# Patient Record
Sex: Male | Born: 1935 | Race: White | Hispanic: No | Smoking: Former smoker
Health system: Southern US, Community
[De-identification: ages and names within clinical notes are randomized; demographics above are authoritative.]

## PROBLEM LIST (undated history)

## (undated) DIAGNOSIS — E785 Hyperlipidemia, unspecified: Secondary | ICD-10-CM

## (undated) DIAGNOSIS — M1990 Unspecified osteoarthritis, unspecified site: Secondary | ICD-10-CM

## (undated) DIAGNOSIS — F329 Major depressive disorder, single episode, unspecified: Secondary | ICD-10-CM

## (undated) DIAGNOSIS — N390 Urinary tract infection, site not specified: Secondary | ICD-10-CM

## (undated) DIAGNOSIS — N183 Chronic kidney disease, stage 3 unspecified: Secondary | ICD-10-CM

## (undated) DIAGNOSIS — M545 Low back pain, unspecified: Secondary | ICD-10-CM

## (undated) DIAGNOSIS — F32A Depression, unspecified: Secondary | ICD-10-CM

## (undated) DIAGNOSIS — G8250 Quadriplegia, unspecified: Secondary | ICD-10-CM

## (undated) DIAGNOSIS — I739 Peripheral vascular disease, unspecified: Secondary | ICD-10-CM

## (undated) DIAGNOSIS — G8929 Other chronic pain: Secondary | ICD-10-CM

## (undated) DIAGNOSIS — N200 Calculus of kidney: Secondary | ICD-10-CM

## (undated) DIAGNOSIS — I10 Essential (primary) hypertension: Secondary | ICD-10-CM

## (undated) DIAGNOSIS — Z87442 Personal history of urinary calculi: Secondary | ICD-10-CM

## (undated) DIAGNOSIS — E1142 Type 2 diabetes mellitus with diabetic polyneuropathy: Secondary | ICD-10-CM

## (undated) DIAGNOSIS — R269 Unspecified abnormalities of gait and mobility: Secondary | ICD-10-CM

## (undated) DIAGNOSIS — M47817 Spondylosis without myelopathy or radiculopathy, lumbosacral region: Secondary | ICD-10-CM

## (undated) DIAGNOSIS — E119 Type 2 diabetes mellitus without complications: Secondary | ICD-10-CM

## (undated) HISTORY — PX: SHOULDER SURGERY: SHX246

## (undated) HISTORY — DX: Peripheral vascular disease, unspecified: I73.9

## (undated) HISTORY — DX: Low back pain, unspecified: M54.50

## (undated) HISTORY — DX: Depression, unspecified: F32.A

## (undated) HISTORY — DX: Major depressive disorder, single episode, unspecified: F32.9

## (undated) HISTORY — PX: EYE SURGERY: SHX253

## (undated) HISTORY — DX: Other chronic pain: G89.29

## (undated) HISTORY — DX: Quadriplegia, unspecified: G82.50

## (undated) HISTORY — DX: Low back pain: M54.5

## (undated) HISTORY — PX: CATARACT EXTRACTION W/ INTRAOCULAR LENS  IMPLANT, BILATERAL: SHX1307

## (undated) HISTORY — DX: Type 2 diabetes mellitus with diabetic polyneuropathy: E11.42

## (undated) HISTORY — DX: Spondylosis without myelopathy or radiculopathy, lumbosacral region: M47.817

## (undated) HISTORY — DX: Unspecified osteoarthritis, unspecified site: M19.90

## (undated) HISTORY — PX: APPENDECTOMY: SHX54

## (undated) HISTORY — PX: BACK SURGERY: SHX140

## (undated) HISTORY — DX: Unspecified abnormalities of gait and mobility: R26.9

## (undated) HISTORY — PX: OTHER SURGICAL HISTORY: SHX169

## (undated) HISTORY — PX: LUMBAR SPINE SURGERY: SHX701

---

## 2000-02-06 ENCOUNTER — Other Ambulatory Visit: Admission: RE | Admit: 2000-02-06 | Discharge: 2000-02-06 | Payer: Self-pay | Admitting: Gastroenterology

## 2001-03-14 ENCOUNTER — Ambulatory Visit (HOSPITAL_COMMUNITY): Admission: RE | Admit: 2001-03-14 | Discharge: 2001-03-14 | Payer: Self-pay | Admitting: Interventional Cardiology

## 2001-05-31 ENCOUNTER — Inpatient Hospital Stay (HOSPITAL_COMMUNITY): Admission: EM | Admit: 2001-05-31 | Discharge: 2001-06-02 | Payer: Self-pay | Admitting: Emergency Medicine

## 2001-05-31 ENCOUNTER — Encounter: Payer: Self-pay | Admitting: Neurosurgery

## 2001-06-01 ENCOUNTER — Encounter: Payer: Self-pay | Admitting: Neurosurgery

## 2001-06-07 ENCOUNTER — Inpatient Hospital Stay (HOSPITAL_COMMUNITY): Admission: EM | Admit: 2001-06-07 | Discharge: 2001-06-13 | Payer: Self-pay | Admitting: Emergency Medicine

## 2003-11-15 ENCOUNTER — Encounter: Admission: RE | Admit: 2003-11-15 | Discharge: 2003-11-15 | Payer: Self-pay | Admitting: Neurosurgery

## 2003-11-28 ENCOUNTER — Encounter: Admission: RE | Admit: 2003-11-28 | Discharge: 2003-11-28 | Payer: Self-pay | Admitting: Neurosurgery

## 2004-01-22 ENCOUNTER — Encounter (INDEPENDENT_AMBULATORY_CARE_PROVIDER_SITE_OTHER): Payer: Self-pay | Admitting: *Deleted

## 2004-01-22 ENCOUNTER — Inpatient Hospital Stay (HOSPITAL_COMMUNITY): Admission: RE | Admit: 2004-01-22 | Discharge: 2004-01-23 | Payer: Self-pay | Admitting: Neurosurgery

## 2004-02-04 ENCOUNTER — Emergency Department (HOSPITAL_COMMUNITY): Admission: EM | Admit: 2004-02-04 | Discharge: 2004-02-04 | Payer: Self-pay | Admitting: Emergency Medicine

## 2004-03-22 ENCOUNTER — Ambulatory Visit (HOSPITAL_COMMUNITY): Admission: RE | Admit: 2004-03-22 | Discharge: 2004-03-22 | Payer: Self-pay | Admitting: Neurosurgery

## 2004-04-08 ENCOUNTER — Inpatient Hospital Stay (HOSPITAL_COMMUNITY): Admission: RE | Admit: 2004-04-08 | Discharge: 2004-04-09 | Payer: Self-pay | Admitting: Neurosurgery

## 2004-04-21 ENCOUNTER — Ambulatory Visit (HOSPITAL_COMMUNITY): Admission: RE | Admit: 2004-04-21 | Discharge: 2004-04-21 | Payer: Self-pay | Admitting: Neurosurgery

## 2004-09-29 ENCOUNTER — Ambulatory Visit: Payer: Self-pay | Admitting: Infectious Diseases

## 2004-10-02 ENCOUNTER — Ambulatory Visit (HOSPITAL_COMMUNITY): Admission: RE | Admit: 2004-10-02 | Discharge: 2004-10-02 | Payer: Self-pay | Admitting: Infectious Diseases

## 2004-10-06 ENCOUNTER — Ambulatory Visit: Payer: Self-pay | Admitting: Infectious Diseases

## 2004-10-13 ENCOUNTER — Ambulatory Visit: Payer: Self-pay | Admitting: Infectious Diseases

## 2004-10-20 ENCOUNTER — Ambulatory Visit: Payer: Self-pay | Admitting: Infectious Diseases

## 2004-10-20 ENCOUNTER — Ambulatory Visit (HOSPITAL_COMMUNITY): Admission: RE | Admit: 2004-10-20 | Discharge: 2004-10-21 | Payer: Self-pay | Admitting: Neurosurgery

## 2004-10-21 ENCOUNTER — Encounter (INDEPENDENT_AMBULATORY_CARE_PROVIDER_SITE_OTHER): Payer: Self-pay | Admitting: *Deleted

## 2004-11-17 ENCOUNTER — Ambulatory Visit: Payer: Self-pay | Admitting: Infectious Diseases

## 2005-01-12 ENCOUNTER — Ambulatory Visit: Payer: Self-pay | Admitting: Infectious Diseases

## 2005-02-03 ENCOUNTER — Ambulatory Visit (HOSPITAL_COMMUNITY): Admission: RE | Admit: 2005-02-03 | Discharge: 2005-02-03 | Payer: Self-pay | Admitting: Gastroenterology

## 2005-04-20 ENCOUNTER — Ambulatory Visit: Payer: Self-pay | Admitting: Infectious Diseases

## 2008-10-31 ENCOUNTER — Encounter: Admission: RE | Admit: 2008-10-31 | Discharge: 2008-10-31 | Payer: Self-pay | Admitting: Neurosurgery

## 2010-11-10 ENCOUNTER — Observation Stay (HOSPITAL_COMMUNITY)
Admission: EM | Admit: 2010-11-10 | Discharge: 2010-11-10 | Payer: Self-pay | Source: Home / Self Care | Admitting: Emergency Medicine

## 2010-11-12 ENCOUNTER — Emergency Department (HOSPITAL_COMMUNITY)
Admission: EM | Admit: 2010-11-12 | Discharge: 2010-11-12 | Payer: Self-pay | Source: Home / Self Care | Admitting: Emergency Medicine

## 2011-02-17 LAB — URINALYSIS, ROUTINE W REFLEX MICROSCOPIC
Bilirubin Urine: NEGATIVE
Bilirubin Urine: NEGATIVE
Glucose, UA: NEGATIVE mg/dL
Glucose, UA: NEGATIVE mg/dL
Ketones, ur: 15 mg/dL — AB
Ketones, ur: 15 mg/dL — AB
Nitrite: NEGATIVE
Nitrite: NEGATIVE
Protein, ur: NEGATIVE mg/dL
Protein, ur: NEGATIVE mg/dL
Specific Gravity, Urine: 1.02 (ref 1.005–1.030)
Specific Gravity, Urine: 1.025 (ref 1.005–1.030)
Urobilinogen, UA: 0.2 mg/dL (ref 0.0–1.0)
Urobilinogen, UA: 0.2 mg/dL (ref 0.0–1.0)
pH: 5 (ref 5.0–8.0)
pH: 5 (ref 5.0–8.0)

## 2011-02-17 LAB — POCT I-STAT, CHEM 8
BUN: 26 mg/dL — ABNORMAL HIGH (ref 6–23)
BUN: 30 mg/dL — ABNORMAL HIGH (ref 6–23)
Calcium, Ion: 1.08 mmol/L — ABNORMAL LOW (ref 1.12–1.32)
Calcium, Ion: 1.13 mmol/L (ref 1.12–1.32)
Chloride: 107 mEq/L (ref 96–112)
Creatinine, Ser: 1.2 mg/dL (ref 0.4–1.5)
Creatinine, Ser: 1.3 mg/dL (ref 0.4–1.5)
Glucose, Bld: 145 mg/dL — ABNORMAL HIGH (ref 70–99)
Glucose, Bld: 157 mg/dL — ABNORMAL HIGH (ref 70–99)
HCT: 39 % (ref 39.0–52.0)
Hemoglobin: 13.3 g/dL (ref 13.0–17.0)
Potassium: 4.2 mEq/L (ref 3.5–5.1)
Sodium: 138 mEq/L (ref 135–145)
Sodium: 138 mEq/L (ref 135–145)
TCO2: 24 mmol/L (ref 0–100)
TCO2: 25 mmol/L (ref 0–100)

## 2011-02-17 LAB — CBC
HCT: 37.7 % — ABNORMAL LOW (ref 39.0–52.0)
HCT: 37.9 % — ABNORMAL LOW (ref 39.0–52.0)
Hemoglobin: 12.4 g/dL — ABNORMAL LOW (ref 13.0–17.0)
Hemoglobin: 12.6 g/dL — ABNORMAL LOW (ref 13.0–17.0)
MCH: 32.2 pg (ref 26.0–34.0)
MCH: 32.6 pg (ref 26.0–34.0)
MCHC: 32.9 g/dL (ref 30.0–36.0)
MCHC: 33.2 g/dL (ref 30.0–36.0)
MCV: 97.9 fL (ref 78.0–100.0)
MCV: 98.2 fL (ref 78.0–100.0)
Platelets: 246 10*3/uL (ref 150–400)
Platelets: 259 10*3/uL (ref 150–400)
RBC: 3.85 MIL/uL — ABNORMAL LOW (ref 4.22–5.81)
RBC: 3.86 MIL/uL — ABNORMAL LOW (ref 4.22–5.81)
RDW: 12.5 % (ref 11.5–15.5)
RDW: 12.5 % (ref 11.5–15.5)
WBC: 11.1 10*3/uL — ABNORMAL HIGH (ref 4.0–10.5)
WBC: 12.8 10*3/uL — ABNORMAL HIGH (ref 4.0–10.5)

## 2011-02-17 LAB — COMPREHENSIVE METABOLIC PANEL
ALT: 18 U/L (ref 0–53)
AST: 22 U/L (ref 0–37)
Albumin: 4 g/dL (ref 3.5–5.2)
Alkaline Phosphatase: 51 U/L (ref 39–117)
BUN: 30 mg/dL — ABNORMAL HIGH (ref 6–23)
CO2: 24 mEq/L (ref 19–32)
Calcium: 9.5 mg/dL (ref 8.4–10.5)
Chloride: 104 mEq/L (ref 96–112)
Creatinine, Ser: 1.26 mg/dL (ref 0.4–1.5)
GFR calc Af Amer: >60 mL/min (ref >60)
GFR calc non Af Amer: 55 mL/min — ABNORMAL LOW (ref >60)
Glucose, Bld: 146 mg/dL — ABNORMAL HIGH (ref 70–99)
Potassium: 4.1 mEq/L (ref 3.5–5.1)
Sodium: 138 mEq/L (ref 135–145)
Total Bilirubin: 0.6 mg/dL (ref 0.3–1.2)
Total Protein: 6.1 g/dL (ref 6.0–8.3)

## 2011-02-17 LAB — HEMOCCULT GUIAC POC 1CARD (OFFICE): Fecal Occult Bld: NEGATIVE

## 2011-02-17 LAB — URINE MICROSCOPIC-ADD ON

## 2011-02-17 LAB — DIFFERENTIAL
Basophils Absolute: 0 10*3/uL (ref 0.0–0.1)
Basophils Absolute: 0 10*3/uL (ref 0.0–0.1)
Basophils Relative: 0 % (ref 0–1)
Basophils Relative: 0 % (ref 0–1)
Eosinophils Absolute: 0 10*3/uL (ref 0.0–0.7)
Eosinophils Absolute: 0.1 10*3/uL (ref 0.0–0.7)
Eosinophils Relative: 0 % (ref 0–5)
Eosinophils Relative: 1 % (ref 0–5)
Lymphocytes Relative: 8 % — ABNORMAL LOW (ref 12–46)
Lymphocytes Relative: 9 % — ABNORMAL LOW (ref 12–46)
Lymphs Abs: 1 10*3/uL (ref 0.7–4.0)
Lymphs Abs: 1.1 10*3/uL (ref 0.7–4.0)
Monocytes Absolute: 0.5 10*3/uL (ref 0.1–1.0)
Monocytes Absolute: 0.9 10*3/uL (ref 0.1–1.0)
Monocytes Relative: 4 % (ref 3–12)
Monocytes Relative: 8 % (ref 3–12)
Neutro Abs: 11.2 10*3/uL — ABNORMAL HIGH (ref 1.7–7.7)
Neutro Abs: 9 10*3/uL — ABNORMAL HIGH (ref 1.7–7.7)
Neutrophils Relative %: 81 % — ABNORMAL HIGH (ref 43–77)
Neutrophils Relative %: 87 % — ABNORMAL HIGH (ref 43–77)

## 2011-02-17 LAB — GLUCOSE, CAPILLARY: Glucose-Capillary: 122 mg/dL — ABNORMAL HIGH (ref 70–99)

## 2011-02-17 LAB — LIPASE, BLOOD: Lipase: 49 U/L (ref 11–59)

## 2011-02-17 LAB — LACTIC ACID, PLASMA
Lactic Acid, Venous: 1.1 mmol/L (ref 0.5–2.2)
Lactic Acid, Venous: 1.3 mmol/L (ref 0.5–2.2)

## 2011-02-17 LAB — URINE CULTURE: Culture  Setup Time: 201112071159

## 2011-04-24 NOTE — Cardiovascular Report (Signed)
[place]. [facility]  Patient:    Javier Murillo, Javier Murillo                     MRN: [id number] Proc. Date: 03/14/01 Adm. Date:  [id number] Disc. Date: [id number] Attending:  Lyn Records. Iii CC:         Cardiac Catheterization Laboratory  Modesta Messing, M.D.   Cardiac Catheterization  PROCEDURE PERFORMED: 1. Left heart catheterization. 2. Selective coronary angiography. 3. Left ventriculography. 4. Perclose arteriotomy closure.  CARDIOLOGIST:  Celso Sickle, M.D.  INDICATIONS:  Abnormal Cardiolyte study in the past, and recent chest discomfort consistent with angina in this patient with risk factors for coronary artery disease, including diabetes.  DESCRIPTION OF PROCEDURE:  After an informed consent, a 6-French sheath was inserted into the right femoral artery using the modified Seldinger technique. A 6-French A2 multipurpose catheter was used for hemodynamic recordings, left ventriculography by power injection and selective left and right coronary angiography.  The patient tolerated the procedure without complications.  A sheathogram was performed on the right iliac, and Perclose was used for arteriotomy closure on the right femoral, without complications.  RESULTS: HEMODYNAMIC DATA: Aortic pressure:  132/64 mmHg. Left ventricular pressure:  133/13 mmHg.  LEFT VENTRICULOGRAPHY:  The left ventricle is normal in size and demonstrates normal overall DD:  03/14/01 TD:  03/14/01 Job: [id number]

## 2011-04-24 NOTE — Op Note (Signed)
NAME:  Javier Murillo, Javier Murillo NO.:  [id number]   MEDICAL RECORD NO.:  [id number]          PATIENT TYPE:  AMB   LOCATION:  ENDO                         FACILITY:  [facility]   PHYSICIAN:  Danise Edge, M.D.   DATE OF BIRTH:  08-Sep-1930   DATE OF PROCEDURE:  02/03/2005  DATE OF DISCHARGE:                                 OPERATIVE REPORT   PROCEDURE:  Screening colonoscopy.   INDICATIONS FOR PROCEDURE:  Mr. [name] is a 77 year old male born  Jul 18, 1930. In 1980, his barium enema showed colonic diverticulosis. Ten  years ago, he underwent a colonoscopy and a hyperplastic polyp was removed.  Mr. [name] is scheduled to undergo a screening colonoscopy with polypectomy  to prevent colon cancer.   ENDOSCOPIST:  Danise Edge, M.D.   PREMEDICATION:  Versed 3 mg, Demerol 30 mg.   DESCRIPTION OF PROCEDURE:  After obtaining informed consent, Mr. [name] was  placed in the left lateral decubitus position. I administered intravenous  Demerol and intravenous Versed to achieve conscious sedation for the  procedure. The patient's blood pressure, oxygen saturation and cardiac  rhythm were monitored throughout the procedure and documented in the medical  record.   Anal inspection and digital rectal exam were normal. The Olympus adjustable  pediatric colonoscope was introduced into the rectum and advanced to the  cecum. Colonic preparation for the exam today was excellent.   RECTUM:  Normal.   SIGMOID COLON AND DESCENDING COLON:  Normal.   SPLENIC FLEXURE:  Normal.   TRANSVERSE COLON:  Normal.   HEPATIC FLEXURE:  Normal.   ASCENDING COLON:  Normal.   CECUM AND ILEOCECAL VALVE:  Normal.   ASSESSMENT:  Normal screening proctocolonoscopy to the cecum.      MJ/MEDQ  D:  02/03/2005  T:  02/03/2005  Job:  [id number]   cc:   Georgann Housekeeper, MD  [address]  [place]  [place] [address]  Fax: [phone]

## 2011-04-24 NOTE — Op Note (Signed)
NAME:  Javier Murillo, Javier Murillo NO.:  [id number]   MEDICAL RECORD NO.:  [id number]          PATIENT TYPE:  OIB   LOCATION:  3030                         FACILITY:  [facility]   PHYSICIAN:  Hewitt Shorts, M.D.DATE OF BIRTH:  [birth year]   DATE OF PROCEDURE:  10/20/2004  DATE OF DISCHARGE:                                 OPERATIVE REPORT   PREOPERATIVE DIAGNOSIS:  Postoperative lumbar wound abscess and sinus tract.   POSTOPERATIVE DIAGNOSIS:  Postoperative lumbar wound abscess and sinus  tract.   PROCEDURES:  Excision and debridement of lumbar wound abscess and sinus  tract with full-thickness epidermis, dermis and subcutaneous tissue  resection with primary closure.   SURGEON:  Hewitt Shorts, M.D.   ANESTHESIA:  General endotracheal.   INDICATIONS:  The patient is a 77 year old man who had undergone lumbar  surgery in the spring of this year.  Postoperatively his course was  complicated by a wound infection treated with several debridements.  The  patient developed a persistent subcutaneous wound abscess with a  periodically draining sinus tract which grew out methicillin-resistant  Staphylococcus aureus.  The decision was made to proceed with excision and  debridement of abscess and sinus tract.   DESCRIPTION OF PROCEDURE:  The patient was brought to the operating room and  placed under general endotracheal anesthesia.  The patient was turned to a  prone position.  The lumbar region was prepped with Betadine soap and  solution and draped in a sterile fashion.  The skin and subcutaneous tissues  were infiltrated lateral to the midline on each side with local anesthetic  with epinephrine.  An elliptical skin incision was made and dissection was  carried down through the subcutaneous tissues to the lumbar fascia.  We then  dissected around the base of the elliptical tissue just superficial to the  lumbar fascia and excised the abscess and sinus tract in total.   There was a  small amount of residual granulation tissue which was further debrided with  sharp dissection.  Then hemostasis was established with use of bipolar  cautery and electrocautery.  Once the excision and debridement was completed  and hemostasis was established, the wound was irrigated with 500 mL of  bacitracin solution and then closed in multiple layers.  The deep  subcutaneous tissues were approximated with interrupted inverted #1 PDS 2  suture.  The subcutaneous and subcuticular were closed with interrupted  inverted 2-0 undyed sutures, as well as 5-0 PDS 2 suture.  The skin edges  were approximated with Dermabond.  The procedure was tolerated well.  The  estimated blood loss was less than 25 mL.  Sponge count correct.  Following  surgery, the patient was to be turned back to the supine position, reversed  from anesthetic, extubated and transferred to the recovery room for further  care.      RWN/MEDQ  D:  10/20/2004  T:  10/20/2004  Job:  [id number]

## 2011-04-24 NOTE — H&P (Signed)
[place]. [facility]  Patient:    Javier Murillo, Javier Murillo                     MRN: [id number] Adm. Date:  [id number] Attending:  Barton Fanny                         History and Physical  HISTORY OF PRESENT ILLNESS:  The patient is a 77 year old right-handed white male who was evaluated for an acute right lumbar radiculopathy.  He is a former patient of mine and is in fact status post three previous lumbar surgeries on the right side by Dr. Hope Pigeon in 1981, on the left side by Dr. Roxan Hockey in 1991, and on the left side by myself in 1997.  Patient explains that his difficulties began a week or so ago with some intermittent pain.  It worsened about two and a half days ago and became severe over the past 24 hours.  He complains of pain in the right side of his low back into his right buttock, hip, and anterolateral right thigh.  He denies any numbness, tingling, or weakness.  He finds that the pain is worse with sitting or laying.  Patient presented to the emergency room after having spoken to me by phone from his home.  We recommended he come in for evaluation.  PAST MEDICAL HISTORY:  Notable for hypertension treated for the past five years or so, history of diabetes treated for the past 18-20 years.  He apparently has had some cardiac arrhythmia in the past which has been controlled.  He did not describe any history of myocardial infarction, cancer, stroke, peptic ulcer disease, or lung disease.  PAST SURGICAL HISTORY:  In addition to his three lumbar surgeries as described above, right inguinal herniorrhaphy in 1992 and right shoulder surgery in 1995.  ALLERGIES:  TETANUS HORSE SERUM.  CURRENT MEDICATIONS: 1. Cozaar 50 mg q.a.m. 2. Atenolol 12.5 mg b.i.d. 3. Glucovance 5/500 two tablets p.o. b.i.d. 4. Humulin R 7-8 units b.i.d. 5. Humulin N 14 units b.i.d.  FAMILY HISTORY:  Father died at age 35 of cardiac disease.  Mother died at age 58 of  liver cancer.  SOCIAL HISTORY:  Patient is married.  His wife, Valentina Gu, is also a patient of mine.  They are retired.  He does not smoke.  REVIEW OF SYSTEMS:  Notable for some shortness of breath associated with exertion.  It has been somewhat of a problem recently.  He did undergo evaluation with Dr. Garnette Scheuermann three months ago, specifically undergoing a cardiac catheterization that was unremarkable and showed little in the way of heart disease.  Review of systems otherwise unremarkable except as noted in history of present illness and past medical history.  PHYSICAL EXAMINATION  GENERAL:  Patient is a well-developed, well-nourished white male in obvious discomfort.  VITAL SIGNS:  Temperature 98.0, pulse 65, blood pressure 174/73, respiratory rate 20.  LUNGS:  Clear to auscultation.  He has symmetrical respiratory excursion.  HEART:  Regular rate and rhythm.  Normal S1, S2.  No murmur.  ABDOMEN:  Soft, nondistended.  Bowel sounds are present.  EXTREMITIES:  No cyanosis, clubbing, or edema.  MUSCULOSKELETAL:  No tenderness to palpation over the lumbosacral spine.  NEUROLOGIC:  Motor examination shows 5/5 strength to the lower extremities including the ileus psoas, quadriceps, dorsiflexors, plantar flexor, extensor hallux longus.  Sensation is intact to pin prick in lower extremities. Reflexes are  absent at the quadriceps and gastrocnemii, though symmetrical bilaterally.  Toes are downgoing bilaterally.  IMPRESSION:  Acute right lumbar radiculopathy, etiology uncertain.  The patient is status post three previous lumbar surgeries.  PLAN:  The patient will be admitted to the 3000 neurosurgical unit.  Will obtain MRI scan of the lumbar scan without and with gallium as well as a lumbosacral spine x-ray ______ flexion/extension views and be able to give further recommendations after these studies are completed. DD:  05/31/01 TD:  05/31/01 Job: [id number]

## 2011-04-24 NOTE — Cardiovascular Report (Signed)
[place]. [facility]  Patient:    Javier Murillo, Javier Murillo                     MRN: [id number] Proc. Date: 03/14/01 Adm. Date:  [id number] Disc. Date: [id number] Attending:  Lyn Records. Iii CC:         Cardiac Catheterization Laboratory  Modesta Messing, M.D.   Cardiac Catheterization  PROCEDURE: 1. Left heart catheterization. 2. Selective coronary angiogram. 3. Left ventriculography. 4. Perclose arteriotomy closure.  CARDIOLOGIST:  Darci Needle, M.D.  INDICATIONS:  The patient is diabetic and has had recent chest discomfort compatible with angina.  There is a history of a prior mildly abnormal Cardiolyte study slightly over one year ago.  This study is being done to document coronary anatomy, and to help guide therapy.  DESCRIPTION OF PROCEDURE:  After informed consent, a 6-French was inserted into the right femoral artery using the modified Seldinger technique.  A 6-French A2 multipurpose catheter was then used for hemodynamic recordings, left ventriculography by power injection, and selective left and right coronary angiography.  A sheathogram was performed in the right femoral arterial sheath, documenting proper position for Perclose arteriotomy closure.  This was performed without difficulty.  No complications occurred.  RESULTS: HEMODYNAMICS: Aortic pressure:  132/64 mmHg. Left ventricular pressure:  133/13 mmHg.  LEFT VENTRICULOGRAPHY:  The left ventricle is normal in size and demonstrates overall normal contractility.  The ejection fraction is in excess of 65%.  No mitral regurgitation is noted.  SELECTIVE CORONARY ANGIOGRAPHY: 1. Left main coronary artery:  The left main coronary artery is normal. 2. Left anterior descending coronary artery:  The left anterior descending    coronary artery is a large vessel that wraps around the left ventricular    apex.  Luminal irregularities are noted in the midvessel after the first    septal  perforator.  In the apical portion of the LAD around the left    ventricular apex there is a 60%-70% stenosis.  There is very small    vessel distribution beyond this stenosis.  The first and second diagonal    are large and free of significant obstruction. 3. Circumflex coronary artery:  The circumflex coronary artery is large    and tortuous.  It gives origin to one dominant branching second obtuse    marginal.  The first obtuse marginal contains a 30% ostial narrowing.    No significant obstruction is noted in the circumflex system. 4. Right coronary artery:  The right coronary artery is a very tortuous    vessel and contains 20% mid-vessel narrowing, but no significant    obstruction is noted.  CONCLUSIONS: 1. Moderate distal left anterior descending coronary artery.  The stenosis    in the left anterior descending coronary artery is around the left    ventricular apex, with only minimal left anterior descending coronary    artery beyond the lesion.  There is also mild right coronary disease. 2. Normal left ventricular function.  RECOMMENDATIONS:  Medical therapy.  Consider other etiologies of chest discomfort, including gastroesophageal reflux, chest wall pain, etc.DD: 03/14/01 TD:  03/14/01 Job: [id number]

## 2011-04-24 NOTE — Op Note (Signed)
NAME:  Javier Murillo, Javier Murillo                        ACCOUNT NO.:  [id number]   MEDICAL RECORD NO.:  [id number]                   PATIENT TYPE:  INP   LOCATION:  3007                                 FACILITY:  [facility]   PHYSICIAN:  Reinaldo Meeker, M.D.              DATE OF BIRTH:  07-18-1930   DATE OF PROCEDURE:  04/08/2004  DATE OF DISCHARGE:                                 OPERATIVE REPORT   PREOPERATIVE DIAGNOSIS:  Nonhealing lumbar wound.   POSTOPERATIVE DIAGNOSIS:  Nonhealing lumbar wound.   OPERATION PERFORMED:  Incision and drainage and revision of lumbar wound.   SURGEON:  Reinaldo Meeker, M.D.   DESCRIPTION OF PROCEDURE:  After being placed in prone position, the  patient's back was prepped and draped in the usual sterile fashion.  Previous lumbar incision was opened and some granulation and mildly purulent  material was encountered superficially but not deep.  The incision was  carried down to the spinous processes.  Subperiosteal dissection was then  carried out on the \  spinous process and lamina to see if there was any tracking of the fluid  down deep but there was not.  At this time, self-retaining retractor was  placed for exposure.  Power irrigator was then used with 3 L of saline and  500 mL of antibiotic irrigation. Any abnormal tissue was excised at this  time.  Cultures were taken prior to irrigation.  A Hemovac drain was then  left in the wound and brought out through separate stab wound incision.  The  wound was then closed in multiple layers of Vicryl with running locking  nylon on the skin,.  A sterile dressing was then applied.  The patient was  extubated and taken to the recovery room in stable condition.                                               Reinaldo Meeker, M.D.    ROK/MEDQ  D:  04/08/2004  T:  04/08/2004  Job:  [id number]

## 2011-04-24 NOTE — Discharge Summary (Signed)
[place]. [facility]  Patient:    Javier Murillo, Javier Murillo                     MRN: [id number] Adm. Date:  [id number] Disc. Date: 06/13/01 Attending:  Tressie Stalker D                           Discharge Summary  HISTORY OF PRESENT ILLNESS:  The patient is a 77 year old man whom I operated on about two weeks ago and he was readmitted towards the end of his first postoperative week by my partner, Dr. Lovell Sheehan, because of recurrent severe radicular pain.  He had undergone a right L4-5 lumbar laminotomy and foraminotomy and had undergone three previous lumbar surgeries, as detailed in his admission notes.  Neurologic examination showed intact strength and sensation and his wound is healing well.  General examination was unremarkable.  HOSPITAL COURSE:  The patient was admitted by Dr. Lovell Sheehan and started on a PCA morphine pump.  Subsequently, he was started on Celebrex and Neurontin and he has made steady progress.  With the Neurontin and Celebrex, his pain has essentially resolved.  He is using infrequent amounts of narcotic analgesics. His PCA was discontinued.  He has been placed on Percocet.  His wound has healed well.  He is afebrile.  He is ambulating and overall he is feeling better.  DISPOSITION:  He is being discharged to home to continue on the Neurontin and Celebrex.  DISCHARGE MEDICATIONS:  Neurontin and Celebrex prescriptions were called into the [facility] pharmacy.  Specifically, Neurontin 30 mg capsules one t.i.d. 100 capsules with five refills, as well as Celebrex 200 mg b.i.d. 60 capsules and three refills.  He has also been given a prescription for Percocet one tablet p.o. 4-6 hours p.r.n. pain, 40 tablets and no refills.  DISCHARGE DIAGNOSIS:  Lumbar radiculopathy.  FOLLOW-UP:  The patient is already scheduled to see me next week for follow-up.  He is to follow up with all of his other treating physicians as previously scheduled. DD:   06/13/01 TD:  06/13/01 Job: [id number]

## 2011-04-24 NOTE — Consult Note (Signed)
NAME:  Javier Murillo, Javier Murillo                        ACCOUNT NO.:  [id number]   MEDICAL RECORD NO.:  [id number]                   PATIENT TYPE:  EMS   LOCATION:  MINO                                 FACILITY:  [facility]   PHYSICIAN:  Donalee Citrin, M.D.                     DATE OF BIRTH:  Apr 14, 1930   DATE OF CONSULTATION:  02/04/2004  DATE OF DISCHARGE:  02/04/2004                                   CONSULTATION   REFERRING PHYSICIAN:  Dr. Lorre Nick.   REASON FOR CONSULTATION:  Swelling of lumbar incision wound.   HISTORY OF PRESENT ILLNESS:  The patient is a very pleasant 77 year old  gentleman who is now 2 weeks out from a lumbar laminectomy and  microdiskectomy, who was last seen by Dr. Rolanda Lundborg Kritzer a few days ago  and noted to have some swelling around his incision and was told that he had  a hematoma and that eventually it would break down and possibly leak out.  Well, it did leak out yesterday, however, it appears the swelling came back  today and they were concerned because it apparently is getting progressively  worse.  The patient denies any new symptoms in his legs, denies any numbness  or tingling, has complete resolution in his preoperative leg pain and has no  difficulty with the bowel or bladder.  He also denies any form of headache,  postural headache or any headache of the like, has just soreness around the  incision.  He denies any fevers, chills, nausea or vomiting.  Review of the  operative procedure with Dr. Reinaldo Meeker showed no evidence of spinal  fluid leakage.   PHYSICAL EXAMINATION:  GENERAL:  On physical exam, the patient is awake and  alert, a 77 year old gentleman who is in no apparent distress.  HEENT:  Within normal limits.  NECK:  Neck supple.  LUNGS:  Lungs are clear to auscultation.  __________ .  NEUROLOGIC:  Neurologically, he has got 5/5 strength in his lower  extremities with the iliopsoas, quads, hamstrings, gastrocs and EHLs.  BACK:  His  incision does have an area of swelling around the __________  of  the incision that is firm.  There is small amount of separation of the  inferior aspect of his incision.   IMPRESSION AND PLAN:  Whether it is some very minor amount of  serosanguineous fluid coming out and there is no erythema, there is no  induration, it does not feel warm, it does not appear to be an infection, it  appears to be a hematoma and I imagine this has shifted position, part of it  has broken down and leaked out and part of it has kind of redistributed  itself and is now causing some swelling in the incision.  I instructed the  patient to kind of watch it and make sure it does not get worse.  He has  been taking Keflex.  I have instructed him to switch over to Cipro, as he is  having some pruritus from the Keflex and keep the incision dressed, watch  for the drainage; drainage should be on and off until his swelling goes  away.  Otherwise, his incisional healing appears to be doing very well.  He  is going to go home in the care of his wife and daughter and call Dr.  Trudee Grip office in the morning, should this get worse.                                               Donalee Citrin, M.D.    GC/MEDQ  D:  02/04/2004  T:  02/05/2004  Job:  [id number]

## 2011-04-24 NOTE — Discharge Summary (Signed)
[place]. [facility]  Patient:    Javier Murillo, Javier Murillo                     MRN: [id number] Adm. Date:  [id number] Disc. Date: [id number] Attending:  Barton Fanny CC:         Dr. Prentiss Bells   Discharge Summary  HISTORY OF PRESENT ILLNESS:  The patient is a 77 year old man who presented with acute, right lumbar radiculopathy.  He had had a previous number of lumbar surgeries and treated for hypertension and diabetes.  PHYSICAL EXAMINATION:  GENERAL:  Unremarkable.  NEUROLOGIC:  Intact strength and sensation.  HOSPITAL COURSE:  The patient was admitted and underwent MRI of the lumbar spine.  This revealed advanced degenerative disc disease of spondylosis with particular foraminal encroachment on the right side at L4-L5.  The patient was taken the following day for a right L4-L5 lumbar laminotomy and foraminotomy. He has done well postoperatively with excellent relief of his radicular pain. He is up and ambulating well.  His wound is healing nicely and he is asking to be discharged to home.  SPECIAL INSTRUCTIONS:  He has been given instructions including wound care and activity.  He is to return to my office in three weeks for followup or sooner if he has increased difficulties.  DISCHARGE MEDICATIONS: 1. Percocet one to two tablets p.o. q.4-6h. p.r.n. pain, 40 tablets    prescribed, no refills. 2. Aleve two tablets b.i.d.  DISCHARGE DIAGNOSES: 1. Lumbar spondylosis. 2. Degenerative disc disease. 3. Radiculopathy. 4. Stenosis. DD:  06/02/01 TD:  06/02/01 Job: [id number]

## 2011-04-24 NOTE — Op Note (Signed)
NAME:  Javier Murillo, Javier Murillo                        ACCOUNT NO.:  [id number]   MEDICAL RECORD NO.:  [id number]                   PATIENT TYPE:  INP   LOCATION:  2899                                 FACILITY:  [facility]   PHYSICIAN:  Reinaldo Meeker, M.D.              DATE OF BIRTH:  [birth year]   DATE OF PROCEDURE:  01/22/2004  DATE OF DISCHARGE:                                 OPERATIVE REPORT   PREOPERATIVE DIAGNOSIS:  Herniated disk L2-3 left and dorsal midline cyst L3-  4 central.   POSTOPERATIVE DIAGNOSIS:  Herniated disk L2-3 left and dorsal midline cyst  L3-4 central.   PROCEDURE:  Left L2-3 intralaminar laminotomy for excision of herniated disk  with the operating microscope and L3-4 bilateral decompressive laminectomy  with removal of cystic mass.  Microdissection of L2-3 disk and L3 nerve  root.   SURGEON:  Reinaldo Meeker, M.D.   ASSISTANT:  Kathaleen Maser. Pool, M.D.   DESCRIPTION OF PROCEDURE:  After being placed in the prone position, the  patient's back was prepped and draped in the usual sterile fashion.  Localizing x-ray was taken prior to incision to identify the appropriate  level.  Midline incision was made above the spinous process of L2, L3, and  L4.  Using the Bovie cutting current, the incision was carried out in the  spinous processes.  Subperiosteal dissection was then carried out on the  left side.  Spinous process of the lamina of facet joint of L2, L3, and L4.  On the right side the L3 and L4 were exposed.  Self-retaining retractor was  placed for exposure and x-ray showed approach at the appropriate level.  Spinous processes and intraspinal ligament at L3-4 were removed.  High speed  drill was then used to perform a midline laminotomy by removing the inferior  1/2 of the L3 lamina, superior 1/2 of the L4 lamina, and medial 1/3 of the  facet joint bilaterally.  Thickened ligamentum flavum and cystic mass off  the ligament and joint were identified and removed  until the underlying  thecal sac was well decompressed and the proximal nerve roots could be seen  taking off.  Laminotomy was then performed on the left side at L2-3 by  removing the inferior 3/4 of the L2 lamina and medial 1/3 of the facet  joint, and the superior 1/3 of the L3 lamina.  Residual bone and ligamentum  flavum were removed in a piecemeal fashion.  At this point, the microscope  was draped and brought into the field and used for the remainder of the  case.  Starting at the disk at the L2-3 on the left.  The annulus was  coagulated and incised with a 15 blade.  Using pituitary rongeurs and  curet's, the disk space was thoroughly cleaned out.  Inspection superior to  it revealed a large subligamentous fragment which was removed in a piecemeal  fashion.  When this was completely removed, an additional free fragment  beneath the L2 ligament was removed to complete the decompression.  At this  point, inspection was carried out at all levels for any evidence for  residual compression and none could be identified.  Large amounts of  irrigation were carried out and bleeding controlled with bipolar coagulation  and Gelfoam.  The wound was then closed using interrupted Vicryl on the  muscle, fascia, subcutaneous and subcu tissues, and staples on the skin.  A  sterile dressing was then applied.  The patient was extubated and taken to  the recovery room in stable condition.                                               Reinaldo Meeker, M.D.    ROK/MEDQ  D:  01/22/2004  T:  01/22/2004  Job:  [id number]

## 2011-04-24 NOTE — Op Note (Signed)
[place]  Patient:    Javier Murillo, Javier Murillo                     MRN: [id number] Proc. Date: 06/01/01 Adm. Date:  [id number] Attending:  Barton Fanny                           Operative Report  PREOPERATIVE DIAGNOSIS:  Lumbar spondylosis, degenerative disk disease, radiculopathy and stenosis.  POSTOPERATIVE DIAGNOSIS:  Lumbar spondylosis, degenerative disk disease, radiculopathy and stenosis.  OPERATION PERFORMED:  Right L4-5 lumbar laminotomy and foraminotomy.  SURGEON:  Hewitt Shorts, M.D.  ASSISTANT:  Mena Goes. Franky Macho, M.D.  ANESTHESIA:  General endotracheal.  INDICATIONS FOR PROCEDURE:  The patient is a  77 year old man who presented with an acute right lumbar radiculopathy and was found to have extensive degenerative changes throughout the lumbar spine.  He had significant stenosis at the L4-5 level with particular foraminal encroachment on the right L4-5 nerve root foramen and a decision was made to proceed with elective laminotomy and foraminotomy and possible microdiskectomy.  DESCRIPTION OF PROCEDURE:  The patient was brought to the operating room and placed under general endotracheal.  The patient was turned to a prone position, lumbar region was prepped with Betadine soap and solution and draped in a sterile fashion.  The midline was infiltrated with local anesthetic with epinephrine.  The previous midline incision was reopened.  Dissection was carried down to the subcutaneous tissue.  A localizing x-ray had been taken prior to skin incision.  dissection was carried down to the lumbar fascia which was incised on the right side of the midline and the paraspinal muscles were dissected from the spinous processes and lamina in subperiosteal fashion. Another x-ray was taken and the L4-5 interlaminar space identified and then the laminotomy was performed using the [facility] Max drill and Kerrison punches. The thecal sac was  identified after removing the ligamentum flavum and then we identified the right L4 and the right L5 nerve roots.  Foraminotomy was performed for each of the nerve roots, in particular for the right L4 nerve root with removal of ligamentum flavum that extended into the foramen.  The disk although degenerated did not contribute significantly to the compression; therefore, we established hemostasis with the use of bipolar cautery and Gelfoam soaked in thrombin and then proceeded with closure.  Prior to closure we instilled 2 cc of fentanyl and 80 mg of Depo-Medrol into the epidural space. Then the deep fascia was closed with interrupted 0 undyed Vicryl sutures, the subcutaneous and subcuticular layer closed with interrupted inverted 2-0 undyed Vicryl sutures and the skin edges were reapproximated with Dermabond.  The patient tolerated the procedure well.  Estimated blood loss was 100 cc.  Sponge, needle and instrument counts were correct.  Following surgery, the patient was turned back to supine position, reversed from anesthetic to be extubated and transferred to the recovery room for further care. DD:  06/01/01 TD:  06/01/01 Job: [id number]

## 2011-04-24 NOTE — H&P (Signed)
[place]  Patient:    Javier Murillo, Javier Murillo                     MRN: [id number] Adm. Date:  [id number] Attending:  Tressie Stalker D                         History and Physical  CHIEF COMPLAINT: Right leg pain.  HISTORY OF PRESENT ILLNESS: The patient is a 77 year old white male, who is a patient of Dr. Newell Coral and has had multiple lumbar surgeries, first back in 1981 by Dr. Hope Pigeon, the second in 1991 by Dr. Roxan Hockey, and the third surgery by Dr. Newell Coral in 1997, his most recent surgery June 01, 2001 by Dr. Newell Coral, in which he underwent right L4-5 laminotomy and foraminotomy.  The patients postoperative course was unremarkable and by postoperative day #1 he was feeling better and was discharged home.  The patient tells me that since then he has had intermittent bouts of severe right leg pain.  It has become more frequent, to the point where he could not bear it last night.  He called me and I recommended he come to the emergency department, and we arranged for his admission.  He complains of intermittent severe pain which radiates from his right ______ region down his right leg in nondescript fashion all the way down to his ankle.  He has not had any numbness or tingling.  He has had no fever, chills, trouble with his wound, etc.  He has not had any trouble on the left currently but has with his previous operations.  Presently the patient is comfortable on a morphine PCA pump and is having no leg pain.  PAST MEDICAL HISTORY/PAST SURGICAL HISTORY/MEDICATIONS/FAMILY HISTORY/SOCIAL HISTORY, ETC: As per Dr. Gae Dry History and Physical of May 31, 2001.  PHYSICAL EXAMINATION:  GENERAL: Pleasant 77 year old white male, in no apparent distress.  VITAL SIGNS: Temperature 97.0 degrees Fahrenheit orally, heart rate 58, respiratory rate 20, blood pressure 140/60.  Oxygen saturation 95% on room air.  BACK: Demonstrates lumbar incision is  healing well without signs of infection, discharge, etc.  He had some mild tenderness, appropriate for being about a week status post surgery.  NEUROLOGIC: Alert and oriented x 3.  Motor strength grossly normal.  Bilateral psoas, quadriceps, gastrocnemius, extensor hallucis longus.  Deep tendon reflexes are 2/4 in bilateral quadriceps and right gastrocnemius, absent in left gastrocnemius.  Straight leg raise testing is negative bilaterally. Sensory examination is normal to light touch.  IMAGING STUDIES: I have reviewed the patients lumbar MRI performed with and without contrast on June 07, 2001 at [facility] and it demonstrates he has had a prior right L4-5 laminotomy and foraminotomy.  He has multi-level degenerative disk disease.  He does have some foraminal bulging of the vertebral disk bilaterally and does have some left greater than right neuroforaminal stenosis at L4-5 and L5-S1.  He does have a small protruding disk at L5-S1 with some mild mass effect on the right S1 nerve root and, as above, has some neuroforaminal stenosis at L4-5 and L5-S1, but I do not see any evidence of a large herniated disk nor of infection.  ADMISSION LABORATORY DATA: WBC 10.2, no left shift.  Sedimentation rate 17.  ASSESSMENT/PLAN:  1. Intractable right leg pain.  The patient is much more comfortable now on a     morphine patient-controlled analgesia.  I suspect he has  a certain amount     of neuropathic pain and he may benefit from starting him on Neurontin 300     mg b.i.d.  I do not want to put him on steroids since he is diabetic.  I     discussed his MRI scan with him and told him he does have some area of     narrowing and some bulging disk but nothing I would recommend surgery for     at this point and I think it will improve with time and medical     management.  2. Multiple medical problems including diabetes mellitus, coronary artery     disease, etc. noted. DD:   06/08/01 TD:  06/08/01 Job: [id number]

## 2014-07-23 ENCOUNTER — Emergency Department (HOSPITAL_COMMUNITY): Payer: Medicare Other

## 2014-07-23 ENCOUNTER — Encounter (HOSPITAL_COMMUNITY): Payer: Self-pay | Admitting: Emergency Medicine

## 2014-07-23 ENCOUNTER — Encounter (HOSPITAL_COMMUNITY): Payer: Self-pay

## 2014-07-23 ENCOUNTER — Inpatient Hospital Stay (HOSPITAL_COMMUNITY)
Admission: EM | Admit: 2014-07-23 | Discharge: 2014-07-25 | DRG: 948 | Disposition: A | Discharge status: Skilled Nursing Facility | Payer: Medicare Other | Attending: Family Medicine | Admitting: Family Medicine

## 2014-07-23 ENCOUNTER — Other Ambulatory Visit (HOSPITAL_COMMUNITY): Payer: Self-pay | Admitting: Internal Medicine

## 2014-07-23 ENCOUNTER — Ambulatory Visit (HOSPITAL_COMMUNITY)
Admission: RE | Admit: 2014-07-23 | Discharge: 2014-07-23 | Disposition: A | Discharge status: Home / Self Care | Payer: Medicare Other | Source: Ambulatory Visit | Attending: Internal Medicine | Admitting: Internal Medicine

## 2014-07-23 DIAGNOSIS — G5600 Carpal tunnel syndrome, unspecified upper limb: Secondary | ICD-10-CM | POA: Diagnosis present

## 2014-07-23 DIAGNOSIS — R5383 Other fatigue: Principal | ICD-10-CM

## 2014-07-23 DIAGNOSIS — Z7982 Long term (current) use of aspirin: Secondary | ICD-10-CM

## 2014-07-23 DIAGNOSIS — E1149 Type 2 diabetes mellitus with other diabetic neurological complication: Secondary | ICD-10-CM | POA: Diagnosis present

## 2014-07-23 DIAGNOSIS — E1142 Type 2 diabetes mellitus with diabetic polyneuropathy: Secondary | ICD-10-CM | POA: Diagnosis present

## 2014-07-23 DIAGNOSIS — R5381 Other malaise: Secondary | ICD-10-CM | POA: Diagnosis not present

## 2014-07-23 DIAGNOSIS — R269 Unspecified abnormalities of gait and mobility: Secondary | ICD-10-CM

## 2014-07-23 DIAGNOSIS — N179 Acute kidney failure, unspecified: Secondary | ICD-10-CM | POA: Diagnosis present

## 2014-07-23 DIAGNOSIS — R42 Dizziness and giddiness: Secondary | ICD-10-CM | POA: Diagnosis present

## 2014-07-23 DIAGNOSIS — E1140 Type 2 diabetes mellitus with diabetic neuropathy, unspecified: Secondary | ICD-10-CM | POA: Diagnosis present

## 2014-07-23 DIAGNOSIS — Z9181 History of falling: Secondary | ICD-10-CM | POA: Diagnosis not present

## 2014-07-23 DIAGNOSIS — R531 Weakness: Secondary | ICD-10-CM | POA: Diagnosis present

## 2014-07-23 DIAGNOSIS — I10 Essential (primary) hypertension: Secondary | ICD-10-CM | POA: Diagnosis present

## 2014-07-23 DIAGNOSIS — E785 Hyperlipidemia, unspecified: Secondary | ICD-10-CM | POA: Diagnosis present

## 2014-07-23 DIAGNOSIS — E1349 Other specified diabetes mellitus with other diabetic neurological complication: Secondary | ICD-10-CM

## 2014-07-23 DIAGNOSIS — E46 Unspecified protein-calorie malnutrition: Secondary | ICD-10-CM | POA: Diagnosis present

## 2014-07-23 DIAGNOSIS — IMO0002 Reserved for concepts with insufficient information to code with codable children: Secondary | ICD-10-CM

## 2014-07-23 DIAGNOSIS — W19XXXA Unspecified fall, initial encounter: Secondary | ICD-10-CM | POA: Diagnosis present

## 2014-07-23 DIAGNOSIS — G909 Disorder of the autonomic nervous system, unspecified: Secondary | ICD-10-CM

## 2014-07-23 DIAGNOSIS — E0843 Diabetes mellitus due to underlying condition with diabetic autonomic (poly)neuropathy: Secondary | ICD-10-CM

## 2014-07-23 DIAGNOSIS — Z794 Long term (current) use of insulin: Secondary | ICD-10-CM

## 2014-07-23 DIAGNOSIS — E0821 Diabetes mellitus due to underlying condition with diabetic nephropathy: Secondary | ICD-10-CM

## 2014-07-23 HISTORY — DX: Essential (primary) hypertension: I10

## 2014-07-23 HISTORY — DX: Type 2 diabetes mellitus without complications: E11.9

## 2014-07-23 LAB — CBC WITH DIFFERENTIAL/PLATELET
BASOS ABS: 0 10*3/uL (ref 0.0–0.1)
Basophils Relative: 0 % (ref 0–1)
EOS PCT: 1 % (ref 0–5)
Eosinophils Absolute: 0.1 10*3/uL (ref 0.0–0.7)
HEMATOCRIT: 37.1 % — AB (ref 39.0–52.0)
HEMOGLOBIN: 12.6 g/dL — AB (ref 13.0–17.0)
LYMPHS ABS: 1.8 10*3/uL (ref 0.7–4.0)
LYMPHS PCT: 18 % (ref 12–46)
MCH: 33.6 pg (ref 26.0–34.0)
MCHC: 34 g/dL (ref 30.0–36.0)
MCV: 98.9 fL (ref 78.0–100.0)
MONO ABS: 0.8 10*3/uL (ref 0.1–1.0)
MONOS PCT: 8 % (ref 3–12)
NEUTROS ABS: 7.1 10*3/uL (ref 1.7–7.7)
Neutrophils Relative %: 73 % (ref 43–77)
Platelets: 267 10*3/uL (ref 150–400)
RBC: 3.75 MIL/uL — ABNORMAL LOW (ref 4.22–5.81)
RDW: 12.7 % (ref 11.5–15.5)
WBC: 9.8 10*3/uL (ref 4.0–10.5)

## 2014-07-23 LAB — COMPREHENSIVE METABOLIC PANEL
ALT: 47 U/L (ref 0–53)
ANION GAP: 12 (ref 5–15)
AST: 33 U/L (ref 0–37)
Albumin: 4.1 g/dL (ref 3.5–5.2)
Alkaline Phosphatase: 105 U/L (ref 39–117)
BILIRUBIN TOTAL: 0.3 mg/dL (ref 0.3–1.2)
BUN: 39 mg/dL — AB (ref 6–23)
CALCIUM: 10 mg/dL (ref 8.4–10.5)
CHLORIDE: 100 meq/L (ref 96–112)
CO2: 26 meq/L (ref 19–32)
CREATININE: 1.44 mg/dL — AB (ref 0.50–1.35)
GFR, EST AFRICAN AMERICAN: 51 mL/min — AB (ref >90)
GFR, EST NON AFRICAN AMERICAN: 44 mL/min — AB (ref >90)
GLUCOSE: 328 mg/dL — AB (ref 70–99)
Potassium: 4.9 mEq/L (ref 3.7–5.3)
Sodium: 138 mEq/L (ref 137–147)
Total Protein: 7.1 g/dL (ref 6.0–8.3)

## 2014-07-23 LAB — URINALYSIS, ROUTINE W REFLEX MICROSCOPIC
Bilirubin Urine: NEGATIVE
GLUCOSE, UA: 500 mg/dL — AB
HGB URINE DIPSTICK: NEGATIVE
KETONES UR: NEGATIVE mg/dL
LEUKOCYTES UA: NEGATIVE
Nitrite: NEGATIVE
PH: 5 (ref 5.0–8.0)
PROTEIN: NEGATIVE mg/dL
Specific Gravity, Urine: 1.025 (ref 1.005–1.030)
Urobilinogen, UA: 0.2 mg/dL (ref 0.0–1.0)

## 2014-07-23 LAB — I-STAT TROPONIN, ED: TROPONIN I, POC: 0 ng/mL (ref 0.00–0.08)

## 2014-07-23 LAB — I-STAT CG4 LACTIC ACID, ED: LACTIC ACID, VENOUS: 1.11 mmol/L (ref 0.5–2.2)

## 2014-07-23 MED ORDER — ACETAMINOPHEN 325 MG PO TABS
650.0000 mg | ORAL_TABLET | Freq: Two times a day (BID) | ORAL | Status: DC
  Administered 2014-07-24 – 2014-07-25 (×4): 650 mg via ORAL
  Filled 2014-07-23 (×5): qty 2

## 2014-07-23 MED ORDER — ZINC 50 MG PO TABS: 50.0000 mg | ORAL_TABLET | Freq: Two times a day (BID) | ORAL | Status: DC

## 2014-07-23 MED ORDER — VITAMIN E 180 MG (400 UNIT) PO CAPS
400.0000 [IU] | ORAL_CAPSULE | Freq: Every day | ORAL | Status: DC
  Administered 2014-07-24 – 2014-07-25 (×2): 400 [IU] via ORAL
  Filled 2014-07-23 (×2): qty 1

## 2014-07-23 MED ORDER — ADULT MULTIVITAMIN W/MINERALS CH
1.0000 | ORAL_TABLET | Freq: Every day | ORAL | Status: DC
  Administered 2014-07-24 – 2014-07-25 (×2): 1 via ORAL
  Filled 2014-07-23 (×2): qty 1

## 2014-07-23 MED ORDER — VITAMIN C 500 MG PO TABS
500.0000 mg | ORAL_TABLET | Freq: Two times a day (BID) | ORAL | Status: DC
  Administered 2014-07-24 – 2014-07-25 (×4): 500 mg via ORAL
  Filled 2014-07-23 (×5): qty 1

## 2014-07-23 MED ORDER — FLUOXETINE HCL 20 MG PO CAPS
20.0000 mg | ORAL_CAPSULE | Freq: Every day | ORAL | Status: DC
  Administered 2014-07-24 – 2014-07-25 (×2): 20 mg via ORAL
  Filled 2014-07-23 (×2): qty 1

## 2014-07-23 MED ORDER — FERROUS SULFATE 325 (65 FE) MG PO TABS
325.0000 mg | ORAL_TABLET | Freq: Every day | ORAL | Status: DC
  Administered 2014-07-24 – 2014-07-25 (×2): 325 mg via ORAL
  Filled 2014-07-23 (×3): qty 1

## 2014-07-23 MED ORDER — INSULIN GLARGINE 100 UNIT/ML ~~LOC~~ SOLN
19.0000 [IU] | Freq: Every day | SUBCUTANEOUS | Status: DC
  Administered 2014-07-24 – 2014-07-25 (×2): 19 [IU] via SUBCUTANEOUS
  Filled 2014-07-23 (×2): qty 0.19

## 2014-07-23 MED ORDER — INSULIN ASPART 100 UNIT/ML ~~LOC~~ SOLN
0.0000 [IU] | Freq: Every day | SUBCUTANEOUS | Status: DC
  Administered 2014-07-24: 2 [IU] via SUBCUTANEOUS

## 2014-07-23 MED ORDER — SODIUM CHLORIDE 0.9 % IV SOLN
INTRAVENOUS | Status: AC
  Administered 2014-07-24: 01:00:00 via INTRAVENOUS

## 2014-07-23 MED ORDER — STROKE: EARLY STAGES OF RECOVERY BOOK
Freq: Once | Status: AC
  Administered 2014-07-24: 1
  Filled 2014-07-23: qty 1

## 2014-07-23 MED ORDER — ASPIRIN EC 81 MG PO TBEC
81.0000 mg | DELAYED_RELEASE_TABLET | Freq: Every day | ORAL | Status: DC
  Administered 2014-07-24 – 2014-07-25 (×2): 81 mg via ORAL
  Filled 2014-07-23 (×2): qty 1

## 2014-07-23 MED ORDER — SIMVASTATIN 10 MG PO TABS
10.0000 mg | ORAL_TABLET | Freq: Every day | ORAL | Status: DC
  Filled 2014-07-23: qty 1

## 2014-07-23 MED ORDER — FLUTICASONE PROPIONATE 50 MCG/ACT NA SUSP
1.0000 | Freq: Every day | NASAL | Status: DC
  Administered 2014-07-25: 1 via NASAL
  Filled 2014-07-23: qty 16

## 2014-07-23 MED ORDER — MECLIZINE HCL 12.5 MG PO TABS
12.5000 mg | ORAL_TABLET | Freq: Every day | ORAL | Status: DC
  Administered 2014-07-24: 12.5 mg via ORAL
  Filled 2014-07-23: qty 1

## 2014-07-23 MED ORDER — GABAPENTIN 100 MG PO CAPS: 100.0000 mg | ORAL_CAPSULE | Freq: Two times a day (BID) | ORAL | Status: DC

## 2014-07-23 MED ORDER — GLYBURIDE 2.5 MG PO TABS: 2.5000 mg | ORAL_TABLET | Freq: Three times a day (TID) | ORAL | Status: DC

## 2014-07-23 MED ORDER — INSULIN ASPART 100 UNIT/ML ~~LOC~~ SOLN
0.0000 [IU] | Freq: Three times a day (TID) | SUBCUTANEOUS | Status: DC
  Administered 2014-07-24: 8 [IU] via SUBCUTANEOUS

## 2014-07-23 MED ORDER — VITAMIN D3 25 MCG (1000 UNIT) PO TABS
1000.0000 [IU] | ORAL_TABLET | Freq: Every day | ORAL | Status: DC
  Administered 2014-07-24 – 2014-07-25 (×2): 1000 [IU] via ORAL
  Filled 2014-07-23 (×2): qty 1

## 2014-07-23 MED ORDER — ATENOLOL 12.5 MG HALF TABLET
12.5000 mg | ORAL_TABLET | Freq: Every day | ORAL | Status: DC
  Administered 2014-07-24 – 2014-07-25 (×2): 12.5 mg via ORAL
  Filled 2014-07-23 (×2): qty 1

## 2014-07-23 NOTE — ED Notes (Signed)
Will obtain updated vital signs when Pt returns from MRI.

## 2014-07-23 NOTE — H&P (Signed)
[facility] History and Physical  Javier Murillo:[id number] DOB: [birth year] DOA: 07/23/2014  Referring physician: ER physician PCP: Georgann Housekeeper, MD   Chief Complaint: weakness  HPI:  80 year old male with past medical history of diabetes, related to diabetic neuropathy, dyslipidemia, hypertension who presented to [facility] ED [phone] with ongoing weakness for some time but getting worse over past one week prior to this admission. Patient reports recent fall which per family seems to be after he has tripped over lawn mower but there was no witnesses during the fall. Patient reported he just sat down and did not hit his head during the fall. There was no evidence of bruising. Patient reports no prodromal symptoms prior to the fall such as chest pain, shortness of breath or palpitations. He did not have any lightheadedness or loss of consciousness. He reports loss of sensation in fingers of both hands, right has been present for quite some time but started noticing the same problem on the left side over past 1 week or so. Per family, they seem to be concerned with this weakness because patient apparently cannot even hold a spoon or feed himself. Usually he was at baseline functioning independently, ambulating with walker. Over past 1 week he has lived with his daughter and son-in-law. No other complaints such as abdominal pain, nausea or vomiting. No reports of blood in the stool or urine. No reports of fevers or chills or cough. In ED, blood pressure was 83/58 but has improved with IV fluids to 108/49. HR was 65, RR 18, T max 98.2 F and oxygen saturation of 99% on room air. Blood work revealed hemoglobin of 12.6 and creatinine of 1.44. Blood glucose was 328. Chest x-ray showed no acute cardiopulmonary disease. CT head and cervical spine did not show acute intracranial abnormalities or evidence of cervical spine fracture. [facility] asked to admit for further evaluation of ongoing weakness and falls.    Assessment & Plan    Principal Problem:   Weakness, falls  Unclear ideology. The CT head and cervical spine did not reveal cause of weakness or falls. There was no evidence of acute intracranial abnormalities or fractures. Osseous concern is for possible stroke.  MRI brain is pending  Stroke order set in place. Followup TSH, A1c and lipid panel. Followup carotid Doppler and 2-D echo.  PT/ OT evaluation once patient able to participate  Order placed for aspirin Active Problems:   Diabetes mellitus with renal complications  Check A1c. Continue insulin regimen per home dose, Lantus 19 units daily.  Continue glyburide 5 mg 3 times daily  Order placed for sliding scale insulin as well.   Diabetic neuropathy  Continue gabapentin   Acute renal failure  Possibly triggered by enalapril. Enalapril is on hold.  Continue IV fluids. Check renal function in a.m.   HTN (hypertension)  May continue atenolol but hold enalapril due to renal insufficiency.   Dyslipidemia  Continue statin therapy.   DVT prophylaxis:   SCD's bilaterally and aspirin   Radiological Exams on Admission: Dg Chest 2 View 07/23/2014     IMPRESSION: No acute cardiopulmonary disease.   Electronically Signed   By: Andreas Newport M.D.   On: 07/23/2014 20:31   Ct Head Wo Contrast 07/23/2014  IMPRESSION: 1. No acute intracranial abnormalities. 2. Small vessel ischemic change and brain atrophy is noted. 3. Cervical spondylosis with anterolisthesis of C3 on C4. 4. No evidence for cervical spine fracture.   Electronically Signed   By: Veronda Prude.D.  On: 07/23/2014 18:17   Ct Cervical Spine Wo Contrast 07/23/2014   IMPRESSION: 1. No acute intracranial abnormalities. 2. Small vessel ischemic change and brain atrophy is noted. 3. Cervical spondylosis with anterolisthesis of C3 on C4. 4. No evidence for cervical spine fracture.   Electronically Signed   By: Signa Kell M.D.   On: 07/23/2014 18:17    EKG: sinus  rhythm  Code Status: Full Family Communication: Plan of care discussed with the patient and his family at the bedside  Disposition Plan: Admit for further evaluation; telemetry floor   Manson Passey, MD  [facility] Pager [phone]  Review of Systems:  Constitutional: Negative for fever, chills and malaise/fatigue. Negative for diaphoresis.  HENT: Negative for hearing loss, ear pain, nosebleeds, congestion, sore throat, neck pain, tinnitus and ear discharge.   Eyes: Negative for blurred vision, double vision, photophobia, pain, discharge and redness.  Respiratory: Negative for cough, hemoptysis, sputum production, shortness of breath, wheezing and stridor.   Cardiovascular: Negative for chest pain, palpitations, orthopnea, claudication and leg swelling.  Gastrointestinal: Negative for nausea, vomiting and abdominal pain. Negative for heartburn, constipation, blood in stool and melena.  Genitourinary: Negative for dysuria, urgency, frequency, hematuria and flank pain.  Musculoskeletal: Negative for myalgias, back pain, joint pain and falls.  Skin: Negative for itching and rash.  Neurological: per HPI Endo/Heme/Allergies: Negative for environmental allergies and polydipsia. Does not bruise/bleed easily.  Psychiatric/Behavioral: Negative for suicidal ideas. The patient is not nervous/anxious.      Past Medical History  Diagnosis Date  . Diabetes mellitus without complication   . Hypertension    History reviewed. No pertinent past surgical history. Social History:  reports that he has never smoked. He does not have any smokeless tobacco history on file. He reports that he does not drink alcohol or use illicit drugs.  No Known Allergies  Family History: htn in family    Prior to Admission medications   Medication Sig Start Date End Date Taking? Authorizing Provider  acetaminophen (TYLENOL) 325 MG tablet Take 650 mg by mouth 2 (two) times daily.   Yes Historical Provider, MD   aspirin EC 81 MG tablet Take 81 mg by mouth daily.   Yes Historical Provider, MD  atenolol (TENORMIN) 25 MG tablet Take 12.5 mg by mouth daily.   Yes Historical Provider, MD  cholecalciferol (VITAMIN D) 1000 UNITS tablet Take 1,000 Units by mouth daily.   Yes Historical Provider, MD  enalapril (VASOTEC) 10 MG tablet Take 5 mg by mouth daily.   Yes Historical Provider, MD  ferrous sulfate 325 (65 FE) MG tablet Take 325 mg by mouth daily with breakfast.   Yes Historical Provider, MD  FLUoxetine (PROZAC) 20 MG capsule Take 20 mg by mouth daily.   Yes Historical Provider, MD  gabapentin (NEURONTIN) 100 MG capsule Take 100-200 mg by mouth 2 (two) times daily. 1 cap in the am and 2 cap in the eveing   Yes Historical Provider, MD  Glucosamine-Chondroitin (GLUCOSAMINE CHONDR COMPLEX PO) Take 1 tablet by mouth 2 (two) times daily.   Yes Historical Provider, MD  glyBURIDE (DIABETA) 5 MG tablet Take 2.5-5 mg by mouth 3 (three) times daily. 5mg  in the am and at lunch, 2.5mg  in the evening   Yes Historical Provider, MD  insulin glargine (LANTUS) 100 UNIT/ML injection Inject 19 Units into the skin daily.   Yes Historical Provider, MD  lovastatin (MEVACOR) 20 MG tablet Take 20 mg by mouth daily at 6 PM.  Yes Historical Provider, MD  meclizine (ANTIVERT) 12.5 MG tablet Take 12.5 mg by mouth daily.   Yes Historical Provider, MD  mometasone (NASONEX) 50 MCG/ACT nasal spray Place 2 sprays into the nose daily as needed. Allergies   Yes Historical Provider, MD  Multiple Vitamin (MULTIVITAMIN WITH MINERALS) TABS tablet Take 1 tablet by mouth daily.   Yes Historical Provider, MD  vitamin C (ASCORBIC ACID) 500 MG tablet Take 500 mg by mouth 2 (two) times daily.   Yes Historical Provider, MD  vitamin E 400 UNIT capsule Take 400 Units by mouth daily.   Yes Historical Provider, MD  Zinc 50 MG TABS Take 50 mg by mouth 2 (two) times daily.   Yes Historical Provider, MD   Physical Exam: Filed Vitals:   07/23/14 1917  07/23/14 1920  BP: 83/58 108/49  Pulse: 65   Temp: 98.2 F (36.8 C)   TempSrc: Oral   Resp: 18   SpO2: 99%     Physical Exam  Constitutional: Appears well-developed and well-nourished. No distress.  HENT: Normocephalic. No tonsillar erythema or exudates Eyes: Conjunctivae and EOM are normal. PERRLA, no scleral icterus.  Neck: Normal ROM. Neck supple. No JVD. No tracheal deviation. No thyromegaly.  CVS: RRR, S1/S2 +, no murmurs, no gallops, no carotid bruit.  Pulmonary: Effort and breath sounds normal, no stridor, rhonchi, wheezes, rales.  Abdominal: Soft. BS +,  no distension, tenderness, rebound or guarding.  Musculoskeletal: Normal range of motion. No edema and no tenderness.  Lymphadenopathy: No lymphadenopathy noted, cervical, inguinal. Neuro: Alert. Normal reflexes; weakness on left side compared with right side upper extremity, loss of sensation in fingertips Skin: Skin is warm and dry. No rash noted. Not diaphoretic. No erythema. No pallor.  Psychiatric: Normal mood and affect. Behavior, judgment, thought content normal.   Labs on Admission:  Basic Metabolic Panel:  Recent Labs Lab 07/23/14 1952  NA 138  K 4.9  CL 100  CO2 26  GLUCOSE 328*  BUN 39*  CREATININE 1.44*  CALCIUM 10.0   Liver Function Tests:  Recent Labs Lab 07/23/14 1952  AST 33  ALT 47  ALKPHOS 105  BILITOT 0.3  PROT 7.1  ALBUMIN 4.1   No results found for this basename: LIPASE, AMYLASE,  in the last 168 hours No results found for this basename: AMMONIA,  in the last 168 hours CBC:  Recent Labs Lab 07/23/14 1952  WBC 9.8  NEUTROABS 7.1  HGB 12.6*  HCT 37.1*  MCV 98.9  PLT 267   Cardiac Enzymes: No results found for this basename: CKTOTAL, CKMB, CKMBINDEX, TROPONINI,  in the last 168 hours BNP: No components found with this basename: POCBNP,  CBG: No results found for this basename: GLUCAP,  in the last 168 hours  If 7PM-7AM, please contact  night-coverage [email or web address] Password [email or web address] 07/23/2014, 10:33 PM

## 2014-07-23 NOTE — ED Notes (Signed)
Patient transported to X-ray 

## 2014-07-23 NOTE — ED Notes (Addendum)
Pt sent here from CT, had an outpt head CT done.  His PMD wanted for pt to come to the ED for further evaluation.  Pt reports weakness x 1 week.  Denies any pain at this time.

## 2014-07-23 NOTE — ED Provider Notes (Signed)
CSN: [id number]     Arrival date & time 07/23/14  1840 History   First MD Initiated Contact with Patient 07/23/14 1927     Chief Complaint  Patient presents with  . Weakness     (Consider location/radiation/quality/duration/timing/severity/associated sxs/prior Treatment) HPI Comments: Patient is an 80 year old male with history of diabetes and hypertension who presents to the emergency department today with generalized weakness. This weakness began on Saturday, one week ago. It has been gradually worsening since that time. Apparently the patient fell off of his lawnmower. The son reports that he stumbled around and landed on his buttocks. The patient has been ambulatory since that time, but has had increasingly difficult time walking. He is now too weak to feed himself with a fork. His son believes the weakness is worse on his left side. Prior to this past week the patient was ambulatory. Currently the patient is alert and oriented x4. He denies pain, headache, dizziness, lightheadedness. He does have history of vertigo for which he takes meclizine. He was seen by his primary care physician today and sent for a head CT. The head CT was unremarkable. His primary care physician sent him to the emergency room for admission to the hospital further evaluation of this weakness.  Patient is a 80 y.o. male presenting with weakness. The history is provided by the patient. No language interpreter was used.  Weakness Associated symptoms include weakness. Pertinent negatives include no abdominal pain, chest pain, chills, fever, nausea or vomiting.    Past Medical History  Diagnosis Date  . Diabetes mellitus without complication   . Hypertension    History reviewed. No pertinent past surgical history. No family history on file. History  Substance Use Topics  . Smoking status: Never Smoker   . Smokeless tobacco: Not on file  . Alcohol Use: No    Review of Systems  Constitutional: Negative for fever  and chills.  Respiratory: Negative for shortness of breath.   Cardiovascular: Negative for chest pain.  Gastrointestinal: Negative for nausea, vomiting and abdominal pain.  Genitourinary: Negative for dysuria.  Neurological: Positive for weakness.  All other systems reviewed and are negative.     Allergies  Review of patient's allergies indicates no known allergies.  Home Medications   Prior to Admission medications   Not on File   BP 120/78  Pulse 57  Temp(Src) 97.4 F (36.3 C) (Oral)  Resp 16  Ht 5\' 8"  (1.727 m)  Wt 156 lb 1.4 oz (70.8 kg)  BMI 23.74 kg/m2  SpO2 97% Physical Exam  Nursing note and vitals reviewed. Constitutional: He is oriented to person, place, and time. He appears well-developed and well-nourished. No distress.  HENT:  Head: Normocephalic and atraumatic.  Right Ear: External ear normal.  Left Ear: External ear normal.  Nose: Nose normal.  Eyes: Conjunctivae and EOM are normal. Pupils are equal, round, and reactive to light.  Neck: Normal range of motion. No tracheal deviation present.  Cardiovascular: Normal rate, regular rhythm, normal heart sounds, intact distal pulses and normal pulses.   Pulmonary/Chest: Effort normal and breath sounds normal. No stridor.  Abdominal: Soft. He exhibits no distension. There is no tenderness.  Musculoskeletal: Normal range of motion.  Neurological: He is alert and oriented to person, place, and time. No sensory deficit. Coordination and gait normal. GCS eye subscore is 4. GCS verbal subscore is 5. GCS motor subscore is 6.  Grip strength 4/5 on left, 5/5 on right Finger nose finger normal, strength 5/5 in  lower extremities bilaterally  Skin: Skin is warm and dry. He is not diaphoretic.  Psychiatric: He has a normal mood and affect. His behavior is normal.    ED Course  Procedures (including critical care time) Labs Review Labs Reviewed  URINALYSIS, ROUTINE W REFLEX MICROSCOPIC - Abnormal; Notable for the  following:    Glucose, UA 500 (*)    All other components within normal limits  CBC WITH DIFFERENTIAL - Abnormal; Notable for the following:    RBC 3.75 (*)    Hemoglobin 12.6 (*)    HCT 37.1 (*)    All other components within normal limits  COMPREHENSIVE METABOLIC PANEL - Abnormal; Notable for the following:    Glucose, Bld 328 (*)    BUN 39 (*)    Creatinine, Ser 1.44 (*)    GFR calc non Af Amer 44 (*)    GFR calc Af Amer 51 (*)    All other components within normal limits  GLUCOSE, CAPILLARY - Abnormal; Notable for the following:    Glucose-Capillary 208 (*)    All other components within normal limits  URINE CULTURE  MRSA PCR SCREENING  HEMOGLOBIN A1C  LIPID PANEL  I-STAT CG4 LACTIC ACID, ED  Rosezena Sensor, ED    Imaging Review Dg Chest 2 View  07/23/2014   CLINICAL DATA:  Weakness.  EXAM: CHEST  2 VIEW  COMPARISON:  01/17/2004.  FINDINGS: Cardiopericardial silhouette within normal limits. Mediastinal contours normal. Trachea midline. No airspace disease or effusion. Monitoring leads project over the chest. Apical lordotic projection. Basilar atelectasis is present over the lower lobes on the lateral view. Severe lower thoracic degenerative disc disease is noted. Upper lumbar degenerative disc disease is also present. Aortic arch atherosclerosis.  IMPRESSION: No acute cardiopulmonary disease.   Electronically Signed   By: Andreas Newport M.D.   On: 07/23/2014 20:31   Ct Head Wo Contrast  07/23/2014   CLINICAL DATA:  Fall 1 week ago.  Abnormality of gait  EXAM: CT HEAD WITHOUT CONTRAST  CT CERVICAL SPINE WITHOUT CONTRAST  TECHNIQUE: Multidetector CT imaging of the head and cervical spine was performed following the standard protocol without intravenous contrast. Multiplanar CT image reconstructions of the cervical spine were also generated.  COMPARISON:  None.  FINDINGS: CT HEAD FINDINGS  There is mild diffuse low-attenuation within the subcortical and periventricular white  matter compatible with chronic microvascular disease. Prominence of the sulci and ventricles noted compatible with brain atrophy. No acute cortical infarct, hemorrhage, or mass lesion ispresent. The paranasal sinuses and mastoid air cells are clear. The skull is intact. No significant extra-axial fluid collection is present.  CT CERVICAL SPINE FINDINGS  Straightening of normal cervical lordosis. There is an anterolisthesis of C3 on C4. Multi level disc space narrowing and ventral endplate spurring is noted at C4-5 through C6-7. Bilateral facet hypertrophy and degenerative change is noted. The prevertebral soft tissue space appears within normal limits. There is no evidence for cervical spine fracture. Calcified atherosclerotic disease involves the carotid arteries.  IMPRESSION: 1. No acute intracranial abnormalities. 2. Small vessel ischemic change and brain atrophy is noted. 3. Cervical spondylosis with anterolisthesis of C3 on C4. 4. No evidence for cervical spine fracture.   Electronically Signed   By: Signa Kell M.D.   On: 07/23/2014 18:17   Ct Cervical Spine Wo Contrast  07/23/2014   CLINICAL DATA:  Fall 1 week ago.  Abnormality of gait  EXAM: CT HEAD WITHOUT CONTRAST  CT CERVICAL SPINE WITHOUT CONTRAST  TECHNIQUE: Multidetector CT imaging of the head and cervical spine was performed following the standard protocol without intravenous contrast. Multiplanar CT image reconstructions of the cervical spine were also generated.  COMPARISON:  None.  FINDINGS: CT HEAD FINDINGS  There is mild diffuse low-attenuation within the subcortical and periventricular white matter compatible with chronic microvascular disease. Prominence of the sulci and ventricles noted compatible with brain atrophy. No acute cortical infarct, hemorrhage, or mass lesion ispresent. The paranasal sinuses and mastoid air cells are clear. The skull is intact. No significant extra-axial fluid collection is present.  CT CERVICAL SPINE FINDINGS   Straightening of normal cervical lordosis. There is an anterolisthesis of C3 on C4. Multi level disc space narrowing and ventral endplate spurring is noted at C4-5 through C6-7. Bilateral facet hypertrophy and degenerative change is noted. The prevertebral soft tissue space appears within normal limits. There is no evidence for cervical spine fracture. Calcified atherosclerotic disease involves the carotid arteries.  IMPRESSION: 1. No acute intracranial abnormalities. 2. Small vessel ischemic change and brain atrophy is noted. 3. Cervical spondylosis with anterolisthesis of C3 on C4. 4. No evidence for cervical spine fracture.   Electronically Signed   By: Signa Kell M.D.   On: 07/23/2014 18:17   Mr Brain Wo Contrast  07/24/2014   CLINICAL DATA:  Left-sided weakness  EXAM: MRI HEAD WITHOUT CONTRAST  TECHNIQUE: Multiplanar, multiecho pulse sequences of the brain and surrounding structures were obtained without intravenous contrast.  COMPARISON:  Prior CT performed earlier on the same day  FINDINGS: Diffuse prominence of the CSF containing spaces is compatible with generalized cerebral atrophy. Scattered and confluent T2/FLAIR hyperintensity within the periventricular and deep white matter both cerebral hemispheres is present, likely related to chronic small vessel ischemic disease, mild for patient age. Similar changes are seen within the pons. Small remote lacunar infarct present within the right midbrain. Additional tiny remote lacunar infarct seen within the periventricular white matter adjacent to the right lateral ventricle (series 6, image 14).  No mass lesion, midline shift, or extra-axial fluid collection. Ventricles are normal in size without evidence of hydrocephalus.  No diffusion-weighted signal abnormality is identified to suggest acute intracranial infarct. Gray-white matter differentiation is maintained. Normal flow voids are seen within the intracranial vasculature. No intracranial hemorrhage  identified.  The cervicomedullary junction is normal. Pituitary gland is within normal limits. Pituitary stalk is midline. The globes and optic nerves demonstrate a normal appearance with normal signal intensity. The  The bone marrow signal intensity is normal. Calvarium is intact. Visualized upper cervical spine is within normal limits.  Scalp soft tissues are unremarkable.  Paranasal sinuses are clear.  No mastoid effusion.  IMPRESSION: 1. No acute intracranial infarct or other abnormality identified. 2. Generalized age-related cerebral atrophy with chronic small vessel ischemic disease. 3. Tiny remote lacunar infarcts within the right mid brain and right periventricular white matter.   Electronically Signed   By: Rise Mu M.D.   On: 07/24/2014 00:07     EKG Interpretation None      MDM   Final diagnoses:  Weakness  Acute renal failure, unspecified acute renal failure type  Diabetic autonomic neuropathy associated with diabetes mellitus due to underlying condition  Essential hypertension    Patient presents to the emergency department for evaluation of generalized weakness which is acutely worsened over the past week. Patient sent from PCP for admission. Patient is now no longer to care for himself alone. Workup here is grossly unremarkable. Discussed case with Dr.  Elisabeth PigeonDevine who agrees to admission. Admission is appreciated. Discussed case with Dr. Gwendolyn GrantWalden who agrees with plan. Patient is hemodynamically stable. Patient / Family / Caregiver informed of clinical course, understand medical decision-making process, and agree with plan.   Mora BellmanHannah S Umi Mainor, PA-C 07/24/14 [phone]

## 2014-07-23 NOTE — ED Notes (Signed)
Patient transported to MRI 

## 2014-07-23 NOTE — ED Notes (Signed)
Bed: [facility] Expected date:  Expected time:  Means of arrival:  Comments: Hold for out pt admit-CT

## 2014-07-23 NOTE — ED Notes (Signed)
Pt still in MRI 

## 2014-07-24 ENCOUNTER — Observation Stay (HOSPITAL_COMMUNITY): Payer: Medicare Other

## 2014-07-24 DIAGNOSIS — E785 Hyperlipidemia, unspecified: Secondary | ICD-10-CM

## 2014-07-24 DIAGNOSIS — I059 Rheumatic mitral valve disease, unspecified: Secondary | ICD-10-CM

## 2014-07-24 DIAGNOSIS — E1329 Other specified diabetes mellitus with other diabetic kidney complication: Secondary | ICD-10-CM

## 2014-07-24 DIAGNOSIS — N058 Unspecified nephritic syndrome with other morphologic changes: Secondary | ICD-10-CM

## 2014-07-24 LAB — GLUCOSE, CAPILLARY
GLUCOSE-CAPILLARY: 165 mg/dL — AB (ref 70–99)
Glucose-Capillary: 115 mg/dL — ABNORMAL HIGH (ref 70–99)
Glucose-Capillary: 208 mg/dL — ABNORMAL HIGH (ref 70–99)
Glucose-Capillary: 252 mg/dL — ABNORMAL HIGH (ref 70–99)
Glucose-Capillary: 99 mg/dL (ref 70–99)

## 2014-07-24 LAB — LIPID PANEL
CHOLESTEROL: 108 mg/dL (ref 0–200)
HDL: 59 mg/dL (ref >39)
LDL CALC: 35 mg/dL (ref 0–99)
Total CHOL/HDL Ratio: 1.8 RATIO
Triglycerides: 68 mg/dL (ref <150)
VLDL: 14 mg/dL (ref 0–40)

## 2014-07-24 LAB — CK: CK TOTAL: 63 U/L (ref 7–232)

## 2014-07-24 LAB — HEMOGLOBIN A1C
HEMOGLOBIN A1C: 8.8 % — AB (ref <5.7)
Mean Plasma Glucose: 206 mg/dL — ABNORMAL HIGH (ref <117)

## 2014-07-24 LAB — MRSA PCR SCREENING: MRSA by PCR: NEGATIVE

## 2014-07-24 MED ORDER — GLYBURIDE 5 MG PO TABS
5.0000 mg | ORAL_TABLET | Freq: Two times a day (BID) | ORAL | Status: DC
  Administered 2014-07-24: 5 mg via ORAL
  Filled 2014-07-24 (×3): qty 1

## 2014-07-24 MED ORDER — GLYBURIDE 2.5 MG PO TABS
2.5000 mg | ORAL_TABLET | Freq: Every day | ORAL | Status: DC
  Filled 2014-07-24: qty 1

## 2014-07-24 MED ORDER — GABAPENTIN 100 MG PO CAPS
100.0000 mg | ORAL_CAPSULE | Freq: Every day | ORAL | Status: DC
  Administered 2014-07-24: 100 mg via ORAL
  Filled 2014-07-24: qty 1

## 2014-07-24 MED ORDER — ZINC SULFATE 220 (50 ZN) MG PO CAPS
220.0000 mg | ORAL_CAPSULE | Freq: Two times a day (BID) | ORAL | Status: DC
  Administered 2014-07-24 – 2014-07-25 (×4): 220 mg via ORAL
  Filled 2014-07-24 (×5): qty 1

## 2014-07-24 MED ORDER — SODIUM CHLORIDE 0.9 % IV SOLN
INTRAVENOUS | Status: AC
  Administered 2014-07-24: 17:00:00 via INTRAVENOUS

## 2014-07-24 MED ORDER — GABAPENTIN 100 MG PO CAPS
200.0000 mg | ORAL_CAPSULE | Freq: Every day | ORAL | Status: DC
  Administered 2014-07-24: 200 mg via ORAL
  Filled 2014-07-24 (×2): qty 2

## 2014-07-24 NOTE — Progress Notes (Signed)
Echocardiogram 2D Echocardiogram has been performed.  Javier Murillo, Javier Murillo M 07/24/2014, 3:51 PM

## 2014-07-24 NOTE — ED Provider Notes (Signed)
Medical screening examination/treatment/procedure(s) were performed by non-physician practitioner and as supervising physician I was immediately available for consultation/collaboration.   EKG Interpretation None         Elwin MochaBlair Rashonda Warrior, MD 07/24/14 [phone]

## 2014-07-24 NOTE — Progress Notes (Signed)
[facility] PROGRESS NOTE  Javier Murillo WJX:[id number] DOB: March 23, 1930 DOA: 07/23/2014 PCP: Georgann Housekeeper, MD  Assessment/Plan: Weakness, falls  Unclear ideology. The CT head and cervical spine did not reveal cause of weakness or falls. There was no evidence of acute intracranial abnormalities or fractures. Osseous concerns for possible stroke.  MRI brain shows no acute infarct, only shows remote lacunar  infarcts.  Stroke order set in place. Followup TSH, A1c and lipid panel. Followup carotid Doppler and 2-D echo.  PT/ OT evaluation once patient  able to participate  ? Secondary to polypharmacy, will discontinue gabapentin, meclizine, zocor. Total CK is 63.   Active Problems:   ? Carpal tunnel syndrome He has positive tinel sign with numbness and tingling in the distribution of the median nerve. Outpatient follow up with Orthopedics for EMG Continue with hand splints.  Diabetes mellitus with renal complications  Hemoglobin  A1c is 8.8 . Continue insulin regimen per home dose, Lantus 19 units daily.  Will hold the oral hypoglycemics. Order placed for sliding scale insulin as well.  Acute renal failure  Possibly triggered by enalapril. Enalapril is on hold.  Continue IV fluids. Check renal function in a.m.  HTN (hypertension)  May continue atenolol but hold enalapril due to renal insufficiency.    Code Status: Full code Family Communication: Discussed with son at bedside Disposition Plan: remains inpatient   Consultants:  None  Procedures:  Echocardiogram  Carotid duplex  Antibiotics:  *None  HPI/Subjective: 80 year old male with past medical history of diabetes, related to diabetic neuropathy, dyslipidemia, hypertension who presented to [facility] ED [phone] with ongoing weakness for some time but getting worse over past one week prior to this admission. Patient reports recent fall which per family seems to be after he has tripped over lawn mower but there was  no witnesses during the fall. Patient reported he just sat down and did not hit his head during the fall. There was no evidence of bruising. Patient reports no prodromal symptoms prior to the fall such as chest pain, shortness of breath or palpitations. He did not have any lightheadedness or loss of consciousness. He reports loss of sensation in fingers of both hands, right has been present for quite some time but started noticing the same problem on the left side over past 1 week or so. Per family, they seem to be concerned with this weakness because patient apparently cannot even hold a spoon or feed himself. Usually he was at baseline functioning independently, ambulating with walker. Over past 1 week he has lived with his daughter and son-in-law. No other complaints such as abdominal pain, nausea or vomiting. No reports of blood in the stool or urine. No reports of fevers or chills or cough.  Today he feels the same, but able to feed himself.   Objective: Filed Vitals:   07/24/14 1416  BP:   Pulse:   Temp: 98 F (36.7 C)  Resp: 16    Intake/Output Summary (Last 24 hours) at 07/24/14 2022 Last data filed at 07/24/14 1900  Gross per 24 hour  Intake 1027.5 ml  Output    301 ml  Net  726.5 ml   Filed Weights   07/23/14 2358  Weight: 70.8 kg (156 lb 1.4 oz)    Exam:  Physical Exam: Head: Normocephalic, atraumatic.        Lungs: Normal respiratory effort. B/L Clear to auscultation, no crackles or wheezes.  Heart: Regular RR. S1 and S2 normal  Abdomen: BS normoactive. Soft,  Nondistended, non-tender.  Extremities: No edema Hands- Positive tinel sign   Data Reviewed: Basic Metabolic Panel:  Recent Labs Lab 07/23/14 1952  NA 138  K 4.9  CL 100  CO2 26  GLUCOSE 328*  BUN 39*  CREATININE 1.44*  CALCIUM 10.0   Liver Function Tests:  Recent Labs Lab 07/23/14 1952  AST 33  ALT 47  ALKPHOS 105  BILITOT 0.3  PROT 7.1  ALBUMIN 4.1   No results found for this  basename: LIPASE, AMYLASE,  in the last 168 hours No results found for this basename: AMMONIA,  in the last 168 hours CBC:  Recent Labs Lab 07/23/14 1952  WBC 9.8  NEUTROABS 7.1  HGB 12.6*  HCT 37.1*  MCV 98.9  PLT 267   Cardiac Enzymes:  Recent Labs Lab 07/24/14 1353  CKTOTAL 63   BNP (last 3 results) No results found for this basename: PROBNP,  in the last 8760 hours CBG:  Recent Labs Lab 07/24/14 0013 07/24/14 0737 07/24/14 1201 07/24/14 1641  GLUCAP 208* 99 115* 252*    Recent Results (from the past 240 hour(s))  MRSA PCR SCREENING     Status: None   Collection Time    07/24/14 12:50 AM      Result Value Ref Range Status   MRSA by PCR NEGATIVE  NEGATIVE Final   Comment:            The GeneXpert MRSA Assay (FDA     approved for NASAL specimens     only), is one component of a     comprehensive MRSA colonization     surveillance program. It is not     intended to diagnose MRSA     infection nor to guide or     monitor treatment for     MRSA infections.     Studies: Dg Chest 2 View  07/24/2014   CLINICAL DATA:  Weakness, hypertension, diabetes  EXAM: CHEST  2 VIEW  COMPARISON:  07/23/2014  FINDINGS: Normal heart size and pulmonary vascularity.  Tortuous thoracic aorta with mild calcification.  Hyperinflated lungs with mild bibasilar atelectasis.  No acute infiltrate, pleural effusion or pneumothorax.  Osseous structures unremarkable.  IMPRESSION: Hyperinflated lungs with mild bibasilar atelectasis greater on RIGHT.   Electronically Signed   By: Ulyses Southward M.D.   On: 07/24/2014 08:23   Dg Chest 2 View  07/23/2014   CLINICAL DATA:  Weakness.  EXAM: CHEST  2 VIEW  COMPARISON:  01/17/2004.  FINDINGS: Cardiopericardial silhouette within normal limits. Mediastinal contours normal. Trachea midline. No airspace disease or effusion. Monitoring leads project over the chest. Apical lordotic projection. Basilar atelectasis is present over the lower lobes on the lateral  view. Severe lower thoracic degenerative disc disease is noted. Upper lumbar degenerative disc disease is also present. Aortic arch atherosclerosis.  IMPRESSION: No acute cardiopulmonary disease.   Electronically Signed   By: Andreas Newport M.D.   On: 07/23/2014 20:31   Ct Head Wo Contrast  07/23/2014   CLINICAL DATA:  Fall 1 week ago.  Abnormality of gait  EXAM: CT HEAD WITHOUT CONTRAST  CT CERVICAL SPINE WITHOUT CONTRAST  TECHNIQUE: Multidetector CT imaging of the head and cervical spine was performed following the standard protocol without intravenous contrast. Multiplanar CT image reconstructions of the cervical spine were also generated.  COMPARISON:  None.  FINDINGS: CT HEAD FINDINGS  There is mild diffuse low-attenuation within the subcortical and periventricular white matter compatible with chronic microvascular disease. Prominence  of the sulci and ventricles noted compatible with brain atrophy. No acute cortical infarct, hemorrhage, or mass lesion ispresent. The paranasal sinuses and mastoid air cells are clear. The skull is intact. No significant extra-axial fluid collection is present.  CT CERVICAL SPINE FINDINGS  Straightening of normal cervical lordosis. There is an anterolisthesis of C3 on C4. Multi level disc space narrowing and ventral endplate spurring is noted at C4-5 through C6-7. Bilateral facet hypertrophy and degenerative change is noted. The prevertebral soft tissue space appears within normal limits. There is no evidence for cervical spine fracture. Calcified atherosclerotic disease involves the carotid arteries.  IMPRESSION: 1. No acute intracranial abnormalities. 2. Small vessel ischemic change and brain atrophy is noted. 3. Cervical spondylosis with anterolisthesis of C3 on C4. 4. No evidence for cervical spine fracture.   Electronically Signed   By: Signa Kellaylor  Stroud M.D.   On: 07/23/2014 18:17   Ct Cervical Spine Wo Contrast  07/23/2014   CLINICAL DATA:  Fall 1 week ago.  Abnormality  of gait  EXAM: CT HEAD WITHOUT CONTRAST  CT CERVICAL SPINE WITHOUT CONTRAST  TECHNIQUE: Multidetector CT imaging of the head and cervical spine was performed following the standard protocol without intravenous contrast. Multiplanar CT image reconstructions of the cervical spine were also generated.  COMPARISON:  None.  FINDINGS: CT HEAD FINDINGS  There is mild diffuse low-attenuation within the subcortical and periventricular white matter compatible with chronic microvascular disease. Prominence of the sulci and ventricles noted compatible with brain atrophy. No acute cortical infarct, hemorrhage, or mass lesion ispresent. The paranasal sinuses and mastoid air cells are clear. The skull is intact. No significant extra-axial fluid collection is present.  CT CERVICAL SPINE FINDINGS  Straightening of normal cervical lordosis. There is an anterolisthesis of C3 on C4. Multi level disc space narrowing and ventral endplate spurring is noted at C4-5 through C6-7. Bilateral facet hypertrophy and degenerative change is noted. The prevertebral soft tissue space appears within normal limits. There is no evidence for cervical spine fracture. Calcified atherosclerotic disease involves the carotid arteries.  IMPRESSION: 1. No acute intracranial abnormalities. 2. Small vessel ischemic change and brain atrophy is noted. 3. Cervical spondylosis with anterolisthesis of C3 on C4. 4. No evidence for cervical spine fracture.   Electronically Signed   By: Signa Kellaylor  Stroud M.D.   On: 07/23/2014 18:17   Mr Brain Wo Contrast  07/24/2014   CLINICAL DATA:  Left-sided weakness  EXAM: MRI HEAD WITHOUT CONTRAST  TECHNIQUE: Multiplanar, multiecho pulse sequences of the brain and surrounding structures were obtained without intravenous contrast.  COMPARISON:  Prior CT performed earlier on the same day  FINDINGS: Diffuse prominence of the CSF containing spaces is compatible with generalized cerebral atrophy. Scattered and confluent T2/FLAIR  hyperintensity within the periventricular and deep white matter both cerebral hemispheres is present, likely related to chronic small vessel ischemic disease, mild for patient age. Similar changes are seen within the pons. Small remote lacunar infarct present within the right midbrain. Additional tiny remote lacunar infarct seen within the periventricular white matter adjacent to the right lateral ventricle (series 6, image 14).  No mass lesion, midline shift, or extra-axial fluid collection. Ventricles are normal in size without evidence of hydrocephalus.  No diffusion-weighted signal abnormality is identified to suggest acute intracranial infarct. Gray-white matter differentiation is maintained. Normal flow voids are seen within the intracranial vasculature. No intracranial hemorrhage identified.  The cervicomedullary junction is normal. Pituitary gland is within normal limits. Pituitary stalk is midline. The globes  and optic nerves demonstrate a normal appearance with normal signal intensity. The  The bone marrow signal intensity is normal. Calvarium is intact. Visualized upper cervical spine is within normal limits.  Scalp soft tissues are unremarkable.  Paranasal sinuses are clear.  No mastoid effusion.  IMPRESSION: 1. No acute intracranial infarct or other abnormality identified. 2. Generalized age-related cerebral atrophy with chronic small vessel ischemic disease. 3. Tiny remote lacunar infarcts within the right mid brain and right periventricular white matter.   Electronically Signed   By: Rise Mu M.D.   On: 07/24/2014 00:07    Scheduled Meds: . acetaminophen  650 mg Oral BID  . aspirin EC  81 mg Oral Daily  . atenolol  12.5 mg Oral Daily  . cholecalciferol  1,000 Units Oral Daily  . ferrous sulfate  325 mg Oral QPC breakfast  . FLUoxetine  20 mg Oral Daily  . fluticasone  1 spray Each Nare Daily  . insulin aspart  0-15 Units Subcutaneous TID WC  . insulin aspart  0-5 Units  Subcutaneous QHS  . insulin glargine  19 Units Subcutaneous Daily  . multivitamin with minerals  1 tablet Oral Daily  . vitamin C  500 mg Oral BID  . vitamin E  400 Units Oral Daily  . zinc sulfate  220 mg Oral BID   Continuous Infusions: . sodium chloride 75 mL/hr at 07/24/14 1708    Principal Problem:   Weakness Active Problems:   Diabetes mellitus with renal complications   Diabetic neuropathy   Acute renal failure   HTN (hypertension)   Dyslipidemia    Time spent: 35 min    [facility]  [facility] Pager [phone]. If 7PM-7AM, please contact night-coverage at [email or web address], password [facility] 07/24/2014, 8:22 PM  LOS: 1 day

## 2014-07-24 NOTE — Progress Notes (Signed)
Inpatient Diabetes Program Recommendations  AACE/ADA: New Consensus Statement on Inpatient Glycemic Control (2013)  Target Ranges:  Prepandial:   less than 140 mg/dL      Peak postprandial:   less than 180 mg/dL (1-2 hours)      Critically ill patients:  140 - 180 mg/dL   Reason for Assessment: Hyperglycemia on admission  Diabetes history: Type 2 Outpatient Diabetes medications: Lantus 19 units daily, Diabeta 5 mg with breakfast and lunch, 2.5 mg with supper Current orders for Inpatient glycemic control: Lantus 19 units daily, Diabeta 5 mg with breakfast and lunch, 2.5 mg with supper and moderate Novolog correction tid  Results for Jule SerWATER, Brylin L (MRN [id number]) as of 07/24/2014 12:27  Ref. Range 07/24/2014 00:13 07/24/2014 07:37 07/24/2014 12:01  Glucose-Capillary Latest Range: 70-99 mg/dL 409208 (H) 99 811115 (H)   Note:  Was NPO this am for swallowing evaluation and did not receive Diabeta.  Given acute renal failure and advanced age, request MD consider the following:  Stop diabeta while in the hospital setting due to high risk of prolonged hypoglyemia  Decrease correction scale to Novolog sensitive Thank you.  Wynter Isaacs S. Elsie Lincolnouth, RN, CNS, CDE Inpatient Diabetes Program, team pager [phone]

## 2014-07-24 NOTE — Evaluation (Signed)
Physical Therapy Evaluation Patient Details Name: Javier Murillo MRN: [id number] DOB: 06-24-1930 Today's Date: 07/24/2014   History of Present Illness  80 year old male with past medical history of diabetes, diabetic neuropathy, dyslipidemia, hypertension, and vertigo admitted 07/23/14 with ongoing weakness.  MRI negative for acute intracranial infarct or abnormality.  Clinical Impression  Pt admitted with weakness. Pt currently with functional limitations due to the deficits listed below (see PT Problem List).  Pt will benefit from skilled PT to increase their independence and safety with mobility to allow discharge to the venue listed below.  Pt and his son report pt has been declining in mobility for past week and has had 2 falls just prior to admission.  Pt and son uncertain of d/c plan at this time however recommended CIR screen and will see how pt progresses.  Recommend assist for mobility at this time as pt presents as high fall risk.     Follow Up Recommendations CIR    Equipment Recommendations  None recommended by PT    Recommendations for Other Services       Precautions / Restrictions Precautions Precautions: Fall Restrictions Weight Bearing Restrictions: No      Mobility  Bed Mobility Overal bed mobility: Needs Assistance Bed Mobility: Supine to Sit     Supine to sit: Min assist     General bed mobility comments: pt up in recliner on arrival  Transfers Overall transfer level: Needs assistance Equipment used: Rolling walker (2 wheeled) Transfers: Sit to/from Stand Sit to Stand: Min assist         General transfer comment: verbal cues for hand placement, assist to rise and steady as well as control descent  Ambulation/Gait Ambulation/Gait assistance: Mod assist Ambulation Distance (Feet): 20 Feet Assistive device: Rolling walker (2 wheeled) Gait Pattern/deviations: Step-through pattern;Narrow base of support;Ataxic;Decreased stride length      General Gait Details: increased assist due to weakness and steadying, pt presents with ataxic gait and poor motor control, LEs buckling near end of gait so brought recliner behind pt, pt also reports some "wooziness" which did not become worse (hx of vertigo)  Stairs            Wheelchair Mobility    Modified Rankin (Stroke Patients Only)       Balance Overall balance assessment: History of Falls Sitting-balance support: Feet supported Sitting balance-Leahy Scale: Fair     Standing balance support: Bilateral upper extremity supported Standing balance-Leahy Scale: Poor                               Pertinent Vitals/Pain Pain Assessment: 0-10 Pain Score: 2  Pain Location: "tailbone" Pain Descriptors / Indicators: Sore Pain Intervention(s): Repositioned    Home Living Family/patient expects to be discharged to:: Unsure Living Arrangements: Children               Additional Comments: has been living with daughter and son in law for about a week due to decline in function however was previously living alone and independent    Prior Function Level of Independence: Needs assistance   Gait / Transfers Assistance Needed: has been using cane then 2 canes then SW, son reports use of w/c also due to 2 falls at home just prior to admission     Comments: recent stand by assist; using standard walker     Hand Dominance        Extremity/Trunk Assessment   Upper  Extremity Assessment: RUE deficits/detail;LUE deficits/detail RUE Deficits / Details: can lift bil UEs to 90.  strength wfls  Dropped toothbrush with RUE     LUE Deficits / Details: can lift to 90; strength WFLs   Lower Extremity Assessment: LLE deficits/detail;RLE deficits/detail RLE Deficits / Details: able to move full range against gravity however presents with poor gross motor control during ambulation LLE Deficits / Details: able to move full range against gravity however presents with  poor gross motor control during ambulation, son reports bil LE buckling and weaker L LE observed at home     Communication   Communication: [facility]  Cognition Arousal/Alertness: Awake/alert Behavior During Therapy: WFL for tasks assessed/performed Overall Cognitive Status: Within Functional Limits for tasks assessed                      General Comments      Exercises        Assessment/Plan    PT Assessment Patient needs continued PT services  PT Diagnosis Generalized weakness;Abnormality of gait   PT Problem List Decreased strength;Decreased balance;Decreased activity tolerance;Decreased knowledge of use of DME;Decreased mobility;Decreased coordination  PT Treatment Interventions Gait training;DME instruction;Balance training;Neuromuscular re-education;Functional mobility training;Patient/family education;Therapeutic activities;Therapeutic exercise   PT Goals (Current goals can be found in the Care Plan section) Acute Rehab PT Goals Patient Stated Goal: get strength back PT Goal Formulation: With patient/family Time For Goal Achievement: 07/31/14 Potential to Achieve Goals: Good    Frequency Min 4X/week   Barriers to discharge        Co-evaluation               End of Session   Activity Tolerance: Patient limited by fatigue Patient left: in chair;with chair alarm set;with call bell/phone within reach;with family/visitor present           Time: 1610-9604 PT Time Calculation (min): 18 min   Charges:   PT Evaluation $Initial PT Evaluation Tier I: 1 Procedure PT Treatments $Gait Training: 8-22 mins   PT G Codes:          Javier Murillo,Javier Murillo 07/24/2014, 11:33 AM Javier Murillo, PT, DPT 07/24/2014 Pager: [phone]

## 2014-07-24 NOTE — Progress Notes (Signed)
Received request for inpatient rehab prescreen and have reviewed pt's case. [facility] Based on pt's current diagnosis of deconditioning and unremarkable workup, [facility] but would likely approve SNF for further rehab needs.   We would recommend pursuing SNF or home with home health in light [facility]. If medical team would like inpatient rehab to be pursued, please contact an admission coordinator.  Thanks.  Juliann MuleJanine Shavaughn Seidl, PT Rehabilitation Admissions Coordinator [phone]

## 2014-07-24 NOTE — Evaluation (Addendum)
Occupational Therapy Evaluation Patient Details Name: Javier Murillo MRN: [id number] DOB: 01-03-1930 Today's Date: 07/24/2014    History of Present Illness Pt was admitted for weakness.  He has a h/o numbness in RUE and now it is also in LUE.  Pt fell when getting off riding mower.  Pt has a h/o DM, neuropathy, HTN and he reports vertigo (on and off--feels tilted)   Clinical Impression   Pt was admitted for the above.  He was living alone prior to a week ago, then stayed with daughter and son-in-law for past week.  Pt was mod I prior to this and he had incidental assistance as needed at their home.  Pt states he has long standing vertigo, but he didn't feel this caused his fall.  Pt will benefit from skilled OT to increase safety and independence with adls. Goals are set for min guard in acute.  He needs min to mod A currently    Follow Up Recommendations  CIR    Equipment Recommendations   (likely has all)    Recommendations for Other Services       Precautions / Restrictions Precautions Precautions: Fall Restrictions Weight Bearing Restrictions: No      Mobility Bed Mobility Overal bed mobility: Needs Assistance Bed Mobility: Supine to Sit     Supine to sit: Min assist     General bed mobility comments: assist for trunk.  Initially off balance  Transfers Overall transfer level: Needs assistance Equipment used: Rolling walker (2 wheeled) Transfers: Sit to/from Stand Sit to Stand: Min assist         General transfer comment: Min A for sit to stand but tends to lean posteriorly with movement    Balance Overall balance assessment: History of Falls;Needs assistance Sitting-balance support: Feet supported Sitting balance-Leahy Scale: Fair     Standing balance support: Bilateral upper extremity supported Standing balance-Leahy Scale: Poor                              ADL Overall ADL's : Needs assistance/impaired Eating/Feeding: NPO   Grooming:  Minimal assistance;Oral care;Sitting   Upper Body Bathing: Minimal assitance;Sitting   Lower Body Bathing: Moderate assistance;Sit to/from stand   Upper Body Dressing : Minimal assistance;Sitting   Lower Body Dressing: Moderate assistance;Sit to/from stand   Toilet Transfer: Moderate assistance;Stand-pivot   Toileting- Clothing Manipulation and Hygiene: Moderate assistance;Sit to/from stand         General ADL Comments: Pt has a tendency to lean posteriorly when standing.  Initially dizzy when sitting eob.  When in chair, BP 132/47.  RN alerted.  Pt tends to drop things:  occasional min A for retrieving items     Vision                     Perception     Praxis      Pertinent Vitals/Pain Pain Assessment: 0-10 Pain Score: 2  Pain Location: L side of neck.  also tailbone when sitting Pain Descriptors / Indicators: Sore Pain Intervention(s): Repositioned (pillow under buttocks in chair)     Hand Dominance     Extremity/Trunk Assessment Upper Extremity Assessment Upper Extremity Assessment: RUE deficits/detail;LUE deficits/detail RUE Deficits / Details: can lift bil UEs to 90.  strength wfls  Dropped toothbrush with RUE LUE Deficits / Details: can lift to 90; strength WFLs           Communication Communication Communication: HOH (  talks a lot)   Cognition Arousal/Alertness: Awake/alert Behavior During Therapy: WFL for tasks assessed/performed Overall Cognitive Status: Within Functional Limits for tasks assessed (not sure of date; sometimes didn't answer question, may be m)                     General Comments   Pt states that vertigo hits him about every week.  He is usually standing and he is not sure what head movement is involved.  He describes a tilted feeling, not really spinning. He felt like alarm clock moved away from him when he looked over at it.    Pt uses AD because of this.  Pt did not get dizzy during OT eval.      Exercises        Shoulder Instructions      Home Living Family/patient expects to be discharged to:: Unsure                                 Additional Comments: was at home alone up until a week ago.  Staying with daughter and son-in-law.  Uses a cane at baseline and also has a standard walker which he used over past week.  He has DME from his wife, who died a couple of years ago      Prior Functioning/Environment Level of Independence: Needs assistance        Comments: recent stand by assist; using standard walker    OT Diagnosis: Generalized weakness   OT Problem List: Decreased strength;Decreased activity tolerance;Impaired balance (sitting and/or standing);Decreased knowledge of use of DME or AE;Cardiopulmonary status limiting activity;Pain   OT Treatment/Interventions: Self-care/ADL training;DME and/or AE instruction;Patient/family education;Balance training    OT Goals(Current goals can be found in the care plan section) Acute Rehab OT Goals Patient Stated Goal: get strength back OT Goal Formulation: With patient Time For Goal Achievement: 08/07/14 Potential to Achieve Goals: Good ADL Goals Pt Will Perform Grooming: with min guard assist;sitting Pt Will Transfer to Toilet: with min guard assist;ambulating;bedside commode Pt Will Perform Toileting - Clothing Manipulation and hygiene: with min guard assist;sit to/from stand Additional ADL Goal #1: Pt will complete UB adls with set up sitting Additional ADL Goal #2: Pt will complete LB adls with min guard, sit to stand Additional ADL Goal #3: Pt will self-feed with built up foam handles and set up assistance, once diet advanced  OT Frequency: Min 2X/week   Barriers to D/C:            Co-evaluation              End of Session Nurse Communication: Mobility status (BP)  Activity Tolerance: Patient tolerated treatment well Patient left: in chair;with call bell/phone within reach;with chair alarm set   Time:  [phone] OT Time Calculation (min): 46 min Charges:  OT General Charges $OT Visit: 1 Procedure OT Evaluation $Initial OT Evaluation Tier I: 1 Procedure OT Treatments $Self Care/Home Management : 8-22 mins $Therapeutic Activity: 8-22 mins G-Codes: OT G-codes **NOT FOR INPATIENT CLASS** Functional Assessment Tool Used: clinical observation and judgment Functional Limitation: Self care Self Care Current Status (V4098): At least 40 percent but less than 60 percent impaired, limited or restricted Self Care Goal Status (J1914): At least 1 percent but less than 20 percent impaired, limited or restricted  [facility] 07/24/2014, 10:49 AM Marica Otter, OTR/L [phone] 07/24/2014

## 2014-07-24 NOTE — Care Management Note (Signed)
CARE MANAGEMENT NOTE 07/24/2014  Patient:  Javier Murillo,Javier Murillo   Account Number:  [id number]  Date Initiated:  07/24/2014  Documentation initiated by:  Berenda MoraleHEFNER,[place]  Subjective/Objective Assessment:   80 YO male admitted with weakness, HX of DM, neuropathy     Action/Plan:   From home, has a PCP   Anticipated DC Date:  07/27/2014   Anticipated DC Plan:  IP REHAB FACILITY      DC Planning Services  CM consult      Choice offered to / List presented to:             Status of service:  In process, will continue to follow Medicare Important Message given?   (If response is "NO", the following Medicare IM given date fields will be blank) Date Medicare IM given:   Medicare IM given by:   Date Additional Medicare IM given:   Additional Medicare IM given by:    Discharge Disposition:    Per UR Regulation:  Reviewed for med. necessity/level of care/duration of stay  If discussed at Long Length of Stay Meetings, dates discussed:    Comments:  07/24/14 Berenda MoraleNora Hefner RN, BSN NCM PT-CIR await CIR recommendations.

## 2014-07-24 NOTE — Progress Notes (Signed)
*  PRELIMINARY RESULTS* Vascular Ultrasound Carotid Duplex (Doppler) has been completed.   Findings suggest 1-39% internal carotid artery stenosis bilaterally. Vertebral arteries are patent with antegrade flow.  07/24/2014 4:18 PM Gertie FeyMichelle Hadriel Northup, RVT, RDCS, RDMS

## 2014-07-25 LAB — CBC
HCT: 37.9 % — ABNORMAL LOW (ref 39.0–52.0)
Hemoglobin: 12.4 g/dL — ABNORMAL LOW (ref 13.0–17.0)
MCH: 32.4 pg (ref 26.0–34.0)
MCHC: 32.7 g/dL (ref 30.0–36.0)
MCV: 99 fL (ref 78.0–100.0)
PLATELETS: 267 10*3/uL (ref 150–400)
RBC: 3.83 MIL/uL — AB (ref 4.22–5.81)
RDW: 12.6 % (ref 11.5–15.5)
WBC: 9.1 10*3/uL (ref 4.0–10.5)

## 2014-07-25 LAB — URINE CULTURE
CULTURE: NO GROWTH
Colony Count: NO GROWTH

## 2014-07-25 LAB — BASIC METABOLIC PANEL
ANION GAP: 12 (ref 5–15)
BUN: 31 mg/dL — AB (ref 6–23)
CHLORIDE: 104 meq/L (ref 96–112)
CO2: 25 mEq/L (ref 19–32)
Calcium: 9.6 mg/dL (ref 8.4–10.5)
Creatinine, Ser: 1.17 mg/dL (ref 0.50–1.35)
GFR, EST AFRICAN AMERICAN: 65 mL/min — AB (ref >90)
GFR, EST NON AFRICAN AMERICAN: 56 mL/min — AB (ref >90)
Glucose, Bld: 105 mg/dL — ABNORMAL HIGH (ref 70–99)
POTASSIUM: 4.7 meq/L (ref 3.7–5.3)
SODIUM: 141 meq/L (ref 137–147)

## 2014-07-25 MED ORDER — MECLIZINE HCL 12.5 MG PO TABS: 12.5000 mg | ORAL_TABLET | Freq: Two times a day (BID) | ORAL | Status: DC | PRN

## 2014-07-25 NOTE — Discharge Summary (Signed)
Physician Discharge Summary  Javier Murillo ZOX:[id number] DOB: 05-Aug-1930 DOA: 07/23/2014  PCP: Georgann Housekeeper, MD  Admit date: 07/23/2014 Discharge date: 07/25/2014  Time spent: > 35 minutes  Recommendations for Outpatient Follow-up:  1. Please be sure to follow up with your primary care physician in 2-3 weeks for further evaluation and recommendations  Discharge Diagnoses:  Please see list below   Discharge Condition: stable  Diet recommendation: Diabetic diet  Filed Weights   07/23/14 2358  Weight: 70.8 kg (156 lb 1.4 oz)    History of present illness:  From original HPI: 80 year old male with past medical history of diabetes, related to diabetic neuropathy, dyslipidemia, hypertension who presented to [facility] ED [phone] with ongoing weakness for some time but getting worse over past one week prior to this admission. Patient reports recent fall which per family seems to be after he has tripped over lawn mower but there was no witnesses during the fall.   Hospital Course:  Weakness, falls  - Most likely due to deconditioning,poor oral intake, and malnutrition at home - Per PT/OT evaluation patient to go to skilled nursing facility of which she is amenable. - Stroke workup negative for acute stroke  Active Problems:  ? Carpal tunnel syndrome  He has positive tinel sign with numbness and tingling in the distribution of the median nerve.  Outpatient follow up with Orthopedics for EMG  Continue with hand splints.   Diabetes mellitus with renal complications  Diabetic diet and we'll have patient continue home regimen. Recommend patient followup with his primary care physician for continued adjustments in hypoglycemic agents.  Acute renal failure  Possibly triggered by enalapril. Enalapril will be discontinued on discharge Serum creatinine improved off of enalapril and on last check was within normal limits at 1.1  HTN (hypertension)  May continue atenolol but hold enalapril due  to renal insufficiency.   Procedures:  Please see below  Consultations:  None  Discharge Exam: Filed Vitals:   07/25/14 0621  BP: 146/63  Pulse: 62  Temp: 97.6 F (36.4 C)  Resp: 20    General: Patient in no acute distress, alert and awake Cardiovascular: Regular rate and rhythm, no murmurs or rubs Respiratory: Clear to auscultation bilaterally, no wheezes  Discharge Instructions You were cared for by a hospitalist during your hospital stay. If you have any questions about your discharge medications or the care you received while you were in the hospital after you are discharged, you can call the unit and asked to speak with the hospitalist on call if the hospitalist that took care of you is not available. Once you are discharged, your primary care physician will handle any further medical issues. Please note that NO REFILLS for any discharge medications will be authorized once you are discharged, as it is imperative that you return to your primary care physician (or establish a relationship with a primary care physician if you do not have one) for your aftercare needs so that they can reassess your need for medications and monitor your lab values.  Discharge Instructions   Call MD for:  difficulty breathing, headache or visual disturbances    Complete by:  As directed      Call MD for:  redness, tenderness, or signs of infection (pain, swelling, redness, odor or green/yellow discharge around incision site)    Complete by:  As directed      Call MD for:  temperature >100.4    Complete by:  As directed  Diet - low sodium heart healthy    Complete by:  As directed      Increase activity slowly    Complete by:  As directed             Medication List    STOP taking these medications       enalapril 10 MG tablet  Commonly known as:  VASOTEC     lovastatin 20 MG tablet  Commonly known as:  MEVACOR      TAKE these medications       acetaminophen 325 MG tablet   Commonly known as:  TYLENOL  Take 650 mg by mouth 2 (two) times daily.     aspirin EC 81 MG tablet  Take 81 mg by mouth daily.     atenolol 25 MG tablet  Commonly known as:  TENORMIN  Take 12.5 mg by mouth daily.     cholecalciferol 1000 UNITS tablet  Commonly known as:  VITAMIN D  Take 1,000 Units by mouth daily.     ferrous sulfate 325 (65 FE) MG tablet  Take 325 mg by mouth daily with breakfast.     FLUoxetine 20 MG capsule  Commonly known as:  PROZAC  Take 20 mg by mouth daily.     gabapentin 100 MG capsule  Commonly known as:  NEURONTIN  Take 100-200 mg by mouth 2 (two) times daily. 1 cap in the am and 2 cap in the eveing     GLUCOSAMINE CHONDR COMPLEX PO  Take 1 tablet by mouth 2 (two) times daily.     glyBURIDE 5 MG tablet  Commonly known as:  DIABETA  Take 2.5-5 mg by mouth 3 (three) times daily. 5mg  in the am and at lunch, 2.5mg  in the evening     insulin glargine 100 UNIT/ML injection  Commonly known as:  LANTUS  Inject 19 Units into the skin daily.     meclizine 12.5 MG tablet  Commonly known as:  ANTIVERT  Take 1 tablet (12.5 mg total) by mouth 2 (two) times daily as needed for dizziness.     mometasone 50 MCG/ACT nasal spray  Commonly known as:  NASONEX  Place 2 sprays into the nose daily as needed. Allergies     multivitamin with minerals Tabs tablet  Take 1 tablet by mouth daily.     vitamin C 500 MG tablet  Commonly known as:  ASCORBIC ACID  Take 500 mg by mouth 2 (two) times daily.     vitamin E 400 UNIT capsule  Take 400 Units by mouth daily.     Zinc 50 MG Tabs  Take 50 mg by mouth 2 (two) times daily.       No Known Allergies    The results of significant diagnostics from this hospitalization (including imaging, microbiology, ancillary and laboratory) are listed below for reference.    Significant Diagnostic Studies: Dg Chest 2 View  07/24/2014   CLINICAL DATA:  Weakness, hypertension, diabetes  EXAM: CHEST  2 VIEW  COMPARISON:   07/23/2014  FINDINGS: Normal heart size and pulmonary vascularity.  Tortuous thoracic aorta with mild calcification.  Hyperinflated lungs with mild bibasilar atelectasis.  No acute infiltrate, pleural effusion or pneumothorax.  Osseous structures unremarkable.  IMPRESSION: Hyperinflated lungs with mild bibasilar atelectasis greater on RIGHT.   Electronically Signed   By: Ulyses SouthwardMark  Boles M.D.   On: 07/24/2014 08:23   Dg Chest 2 View  07/23/2014   CLINICAL DATA:  Weakness.  EXAM: CHEST  2 VIEW  COMPARISON:  01/17/2004.  FINDINGS: Cardiopericardial silhouette within normal limits. Mediastinal contours normal. Trachea midline. No airspace disease or effusion. Monitoring leads project over the chest. Apical lordotic projection. Basilar atelectasis is present over the lower lobes on the lateral view. Severe lower thoracic degenerative disc disease is noted. Upper lumbar degenerative disc disease is also present. Aortic arch atherosclerosis.  IMPRESSION: No acute cardiopulmonary disease.   Electronically Signed   By: Andreas Newport M.D.   On: 07/23/2014 20:31   Ct Head Wo Contrast  07/23/2014   CLINICAL DATA:  Fall 1 week ago.  Abnormality of gait  EXAM: CT HEAD WITHOUT CONTRAST  CT CERVICAL SPINE WITHOUT CONTRAST  TECHNIQUE: Multidetector CT imaging of the head and cervical spine was performed following the standard protocol without intravenous contrast. Multiplanar CT image reconstructions of the cervical spine were also generated.  COMPARISON:  None.  FINDINGS: CT HEAD FINDINGS  There is mild diffuse low-attenuation within the subcortical and periventricular white matter compatible with chronic microvascular disease. Prominence of the sulci and ventricles noted compatible with brain atrophy. No acute cortical infarct, hemorrhage, or mass lesion ispresent. The paranasal sinuses and mastoid air cells are clear. The skull is intact. No significant extra-axial fluid collection is present.  CT CERVICAL SPINE FINDINGS   Straightening of normal cervical lordosis. There is an anterolisthesis of C3 on C4. Multi level disc space narrowing and ventral endplate spurring is noted at C4-5 through C6-7. Bilateral facet hypertrophy and degenerative change is noted. The prevertebral soft tissue space appears within normal limits. There is no evidence for cervical spine fracture. Calcified atherosclerotic disease involves the carotid arteries.  IMPRESSION: 1. No acute intracranial abnormalities. 2. Small vessel ischemic change and brain atrophy is noted. 3. Cervical spondylosis with anterolisthesis of C3 on C4. 4. No evidence for cervical spine fracture.   Electronically Signed   By: Signa Kell M.D.   On: 07/23/2014 18:17   Ct Cervical Spine Wo Contrast  07/23/2014   CLINICAL DATA:  Fall 1 week ago.  Abnormality of gait  EXAM: CT HEAD WITHOUT CONTRAST  CT CERVICAL SPINE WITHOUT CONTRAST  TECHNIQUE: Multidetector CT imaging of the head and cervical spine was performed following the standard protocol without intravenous contrast. Multiplanar CT image reconstructions of the cervical spine were also generated.  COMPARISON:  None.  FINDINGS: CT HEAD FINDINGS  There is mild diffuse low-attenuation within the subcortical and periventricular white matter compatible with chronic microvascular disease. Prominence of the sulci and ventricles noted compatible with brain atrophy. No acute cortical infarct, hemorrhage, or mass lesion ispresent. The paranasal sinuses and mastoid air cells are clear. The skull is intact. No significant extra-axial fluid collection is present.  CT CERVICAL SPINE FINDINGS  Straightening of normal cervical lordosis. There is an anterolisthesis of C3 on C4. Multi level disc space narrowing and ventral endplate spurring is noted at C4-5 through C6-7. Bilateral facet hypertrophy and degenerative change is noted. The prevertebral soft tissue space appears within normal limits. There is no evidence for cervical spine fracture.  Calcified atherosclerotic disease involves the carotid arteries.  IMPRESSION: 1. No acute intracranial abnormalities. 2. Small vessel ischemic change and brain atrophy is noted. 3. Cervical spondylosis with anterolisthesis of C3 on C4. 4. No evidence for cervical spine fracture.   Electronically Signed   By: Signa Kell M.D.   On: 07/23/2014 18:17   Mr Brain Wo Contrast  07/24/2014   CLINICAL DATA:  Left-sided weakness  EXAM: MRI HEAD WITHOUT  CONTRAST  TECHNIQUE: Multiplanar, multiecho pulse sequences of the brain and surrounding structures were obtained without intravenous contrast.  COMPARISON:  Prior CT performed earlier on the same day  FINDINGS: Diffuse prominence of the CSF containing spaces is compatible with generalized cerebral atrophy. Scattered and confluent T2/FLAIR hyperintensity within the periventricular and deep white matter both cerebral hemispheres is present, likely related to chronic small vessel ischemic disease, mild for patient age. Similar changes are seen within the pons. Small remote lacunar infarct present within the right midbrain. Additional tiny remote lacunar infarct seen within the periventricular white matter adjacent to the right lateral ventricle (series 6, image 14).  No mass lesion, midline shift, or extra-axial fluid collection. Ventricles are normal in size without evidence of hydrocephalus.  No diffusion-weighted signal abnormality is identified to suggest acute intracranial infarct. Gray-white matter differentiation is maintained. Normal flow voids are seen within the intracranial vasculature. No intracranial hemorrhage identified.  The cervicomedullary junction is normal. Pituitary gland is within normal limits. Pituitary stalk is midline. The globes and optic nerves demonstrate a normal appearance with normal signal intensity. The  The bone marrow signal intensity is normal. Calvarium is intact. Visualized upper cervical spine is within normal limits.  Scalp soft  tissues are unremarkable.  Paranasal sinuses are clear.  No mastoid effusion.  IMPRESSION: 1. No acute intracranial infarct or other abnormality identified. 2. Generalized age-related cerebral atrophy with chronic small vessel ischemic disease. 3. Tiny remote lacunar infarcts within the right mid brain and right periventricular white matter.   Electronically Signed   By: Rise Mu M.D.   On: 07/24/2014 00:07    Microbiology: Recent Results (from the past 240 hour(s))  URINE CULTURE     Status: None   Collection Time    07/23/14  9:02 PM      Result Value Ref Range Status   Specimen Description URINE, CATHETERIZED   Final   Special Requests NONE   Final   Culture  Setup Time     Final   Value: 07/24/2014 02:53     Performed at [facility] Count     Final   Value: NO GROWTH     Performed at [facility]   Culture     Final   Value: NO GROWTH     Performed at [facility]   Report Status 07/25/2014 FINAL   Final  MRSA PCR SCREENING     Status: None   Collection Time    07/24/14 12:50 AM      Result Value Ref Range Status   MRSA by PCR NEGATIVE  NEGATIVE Final   Comment:            The GeneXpert MRSA Assay (FDA     approved for NASAL specimens     only), is one component of a     comprehensive MRSA colonization     surveillance program. It is not     intended to diagnose MRSA     infection nor to guide or     monitor treatment for     MRSA infections.     Labs: Basic Metabolic Panel:  Recent Labs Lab 07/23/14 1952 07/25/14 0350  NA 138 141  K 4.9 4.7  CL 100 104  CO2 26 25  GLUCOSE 328* 105*  BUN 39* 31*  CREATININE 1.44* 1.17  CALCIUM 10.0 9.6   Liver Function Tests:  Recent Labs Lab 07/23/14 1952  AST 33  ALT  47  ALKPHOS 105  BILITOT 0.3  PROT 7.1  ALBUMIN 4.1   No results found for this basename: LIPASE, AMYLASE,  in the last 168 hours No results found for this basename: AMMONIA,  in the last 168  hours CBC:  Recent Labs Lab 07/23/14 1952 07/25/14 0350  WBC 9.8 9.1  NEUTROABS 7.1  --   HGB 12.6* 12.4*  HCT 37.1* 37.9*  MCV 98.9 99.0  PLT 267 267   Cardiac Enzymes:  Recent Labs Lab 07/24/14 1353  CKTOTAL 63   BNP: BNP (last 3 results) No results found for this basename: PROBNP,  in the last 8760 hours CBG:  Recent Labs Lab 07/24/14 0013 07/24/14 0737 07/24/14 1201 07/24/14 1641 07/24/14 2125  GLUCAP 208* 99 115* 252* 165*       Signed:  Penny Pia  [facility] 07/25/2014, 10:53 AM

## 2014-07-25 NOTE — Progress Notes (Signed)
Clinical Social Work Department CLINICAL SOCIAL WORK PLACEMENT NOTE 07/25/2014  Patient:  Javier Murillo,[name]  Account Number:  [id number] Admit date:  07/23/2014  Clinical Social Worker:  Orpah GreekKELLY FOLEY, LCSWA  Date/time:  07/25/2014 10:37 AM  Clinical Social Work is seeking post-discharge placement for this patient at the following level of care:   SKILLED NURSING   (*CSW will update this form in Epic as items are completed)   07/25/2014  Patient/family provided with [facility] Department of Clinical Social Work's list of facilities offering this level of care within the geographic area requested by the patient (or if unable, by the patient's family).  07/25/2014  Patient/family informed of their freedom to choose among providers that offer the needed level of care, that participate in Medicare, Medicaid or managed care program needed by the patient, have an available bed and are willing to accept the patient.  07/25/2014  Patient/family informed of [facility]' ownership interest in [facility], as well as of the fact that they are under no obligation to receive care at this facility.  PASARR submitted to EDS on 07/25/2014 PASARR number received on 07/25/2014  FL2 transmitted to all facilities in geographic area requested by pt/family on  07/25/2014 FL2 transmitted to all facilities within larger geographic area on   Patient informed that his/her managed care company has contracts with or will negotiate with  certain facilities, including the following:     Patient/family informed of bed offers received:  07/25/2014 [facility] [place] Physician recommends and patient chooses bed at    [facility] [place] on  07/25/2014 Patient to be transferred to facility by patient's son-in-law, Thayer OhmChris Patient and family notified of transfer on 07/25/2014 Name of family member notified:  patient's  son-in-law, Thayer Ohmhris  The following physician request were entered in Epic:   Additional Comments:   Lincoln MaxinKelly Rucker Pridgeon, LCSW [facility] Clinical Social Worker cell #: [phone]

## 2014-07-25 NOTE — Progress Notes (Signed)
Clinical Social Work Department BRIEF PSYCHOSOCIAL ASSESSMENT 07/25/2014  Patient:  Javier Murillo, Javier Murillo     Account Number:  [id number]     Admit date:  07/23/2014  Clinical Social Worker:  Renold Genta  Date/Time:  07/25/2014 10:35 AM  Referred by:  Physician  Date Referred:  07/25/2014 Referred for  SNF Placement   Other Referral:   Interview type:  Patient Other interview type:   and son-in-law, Javier Murillo at bedside    PSYCHOSOCIAL DATA Living Status:  ALONE Admitted from facility:   Level of care:   Primary support name:  Javier Murillo (son-in-law) cell#: [phone] Primary support relationship to patient:  CHILD, ADULT Degree of support available:   good    CURRENT CONCERNS Current Concerns  Post-Acute Placement   Other Concerns:    SOCIAL WORK ASSESSMENT / PLAN CSW received consult for SNF placement as backup to CIR.   Assessment/plan status:  Information/Referral to [employer] Other assessment/ plan:   Information/referral to community resources:   CSW completed FL2 and faxed information out to [facility].    PATIENT'S/FAMILY'S RESPONSE TO PLAN OF CARE: CSW met with patient & son-in-law, Javier Murillo at bedside re: discharge planning. Patient & family are agreeable with plan for SNF, requesting [facility] as patient's wife had been there in the past & they were pleased with the care she received. CSW confirmed with Javier Murillo @ [facility] that they will have a bed available and can take patient today. Dr. Wendee Murillo aware.         Javier Murillo, [facility] Clinical Social Worker cell #: [phone]

## 2014-07-25 NOTE — Progress Notes (Signed)
Occupational Therapy Treatment Patient Details Name: Javier Murillo MRN: [id number] DOB: May 08, 1930 Today's Date: 07/25/2014    History of present illness 80 year old male with past medical history of diabetes, diabetic neuropathy, dyslipidemia, hypertension, and vertigo admitted 07/23/14 with ongoing weakness.  MRI negative for acute intracranial infarct or abnormality.   OT comments  Pt had wrist cock up splint in room.  Pt doesn't like it and did not want to wear it during self-feeding.  AE provided and pt doing well with self feeding.    Follow Up Recommendations  SNF ((if home needs 24/7 and HHOT))    Equipment Recommendations  3 in 1 bedside comode    Recommendations for Other Services      Precautions / Restrictions Precautions Precautions: Fall Restrictions Weight Bearing Restrictions: No       Mobility Bed Mobility         Supine to sit: Min assist     General bed mobility comments: assist for trunk  Transfers   Equipment used: Rolling walker (2 wheeled) Transfers: Sit to/from Stand Sit to Stand: Min assist         General transfer comment: vcs for hand placement and assist to rise and steady    Balance                                   ADL   Eating/Feeding: Set up;Sitting;With adaptive utensils                       Toilet Transfer: Moderate assistance;Stand-pivot (to recliner)   Toileting- Clothing Manipulation and Hygiene: Moderate assistance;Sit to/from stand         General ADL Comments: Provided cup with lid for beverages and built up foam for utensils.  Pt able to use utensils without dropping food.  Had spilled coffee prior to my bringing cup.  Spoke to NT about making sure foam and cup stayed in room.  Marked with tape with name and room number.  Pt had an accident when trying to use urinal.  Transferred to recliner and NT will help him back to bed once sheets are changed.  Pt needed cues to keep hands on walker  when turning.  He let go and reached for armrest of chair midway through transfer.  Sat pt on pillow as yesterday he c/o pain.      Vision                     Perception     Praxis      Cognition   Behavior During Therapy: WFL for tasks assessed/performed Overall Cognitive Status: Impaired/Different from baseline Area of Impairment: Safety/judgement                General Comments: reoriented to call bell and pt pushed button, stated name and really had no needs--OT with him at the time    [facility] Assessment               Exercises     Shoulder Instructions       General Comments      Pertinent Vitals/ Pain       Pain Assessment: No/denies pain  Home Living Family/patient expects to be discharged to:: Unsure Living Arrangements: Children  Prior Functioning/Environment              Frequency Min 2X/week     Progress Toward Goals  OT Goals(current goals can now be found in the care plan section)  Progress towards OT goals: Progressing toward goals     Plan Discharge plan needs to be updated    Co-evaluation                 End of Session     Activity Tolerance Patient tolerated treatment well   Patient Left in chair;with call bell/phone within reach;with chair alarm set   Nurse Communication          Time:  -     Charges: OT General Charges $OT Visit: 1 Procedure OT Treatments $Self Care/Home Management : 23-37 mins  Elio Haden 07/25/2014, 9:35 AM  Marica OtterMaryellen Kambria Grima, OTR/L [phone] 07/25/2014

## 2014-07-25 NOTE — Care Management Note (Signed)
    Page 1 of 1   07/25/2014     12:30:05 PM CARE MANAGEMENT NOTE 07/25/2014  Patient:  Javier Murillo,Javier Murillo   Account Number:  [id number]  Date Initiated:  07/24/2014  Documentation initiated by:  Berenda MoraleHEFNER,NORA  Subjective/Objective Assessment:   80 YO male admitted with weakness, HX of DM, neuropathy     Action/Plan:   From home, has a PCP   Anticipated DC Date:  07/25/2014   Anticipated DC Plan:  SKILLED NURSING FACILITY      DC Planning Services  CM consult      Choice offered to / List presented to:             Status of service:  Completed, signed off Medicare Important Message given?   (If response is "NO", the following Medicare IM given date fields will be blank) Date Medicare IM given:   Medicare IM given by:   Date Additional Medicare IM given:   Additional Medicare IM given by:    Discharge Disposition:  SKILLED NURSING FACILITY  Per UR Regulation:  Reviewed for med. necessity/level of care/duration of stay  If discussed at Long Length of Stay Meetings, dates discussed:    Comments:  07/25/14 Lanier ClamKATHY Shahan Starks RN,BSN NCM [phone] D/C SNF.  07/24/14 Berenda MoraleNora Hefner RN, BSN NCM PT-CIR await CIR recommendations.

## 2014-07-25 NOTE — Evaluation (Signed)
SLP Cancellation Note  Patient Details Name: Javier SerRobert L Cannan MRN: [id number] DOB: 1930/08/10   Cancelled treatment:       Reason Eval/Treat Not Completed: Other (comment) (pt working with OT at this time, will reattempt eval at later time)   [name], MS [facility] SLP [phone]

## 2014-07-25 NOTE — Progress Notes (Signed)
Patient is set to discharge to [facility]. Patient, daughter, Steward DroneBrenda & son-in-law Thayer OhmChris aware. Discharge packet given to family. RN, Boneta LucksJenny aware. Family to transport to SNF.   Lincoln MaxinKelly Katheryne Gorr, LCSW [facility] Clinical Social Worker cell #: [phone]

## 2014-07-26 LAB — GLUCOSE, CAPILLARY
GLUCOSE-CAPILLARY: 92 mg/dL (ref 70–99)
Glucose-Capillary: 228 mg/dL — ABNORMAL HIGH (ref 70–99)

## 2014-08-14 ENCOUNTER — Ambulatory Visit (INDEPENDENT_AMBULATORY_CARE_PROVIDER_SITE_OTHER): Payer: Medicare Other | Admitting: Radiology

## 2014-08-14 ENCOUNTER — Ambulatory Visit (INDEPENDENT_AMBULATORY_CARE_PROVIDER_SITE_OTHER): Payer: Medicare Other | Admitting: Neurology

## 2014-08-14 DIAGNOSIS — Z0289 Encounter for other administrative examinations: Secondary | ICD-10-CM

## 2014-08-14 DIAGNOSIS — R209 Unspecified disturbances of skin sensation: Secondary | ICD-10-CM

## 2014-08-14 DIAGNOSIS — G5603 Carpal tunnel syndrome, bilateral upper limbs: Secondary | ICD-10-CM

## 2014-08-14 DIAGNOSIS — E1142 Type 2 diabetes mellitus with diabetic polyneuropathy: Secondary | ICD-10-CM

## 2014-08-14 NOTE — Procedures (Signed)
     HISTORY:  Javier Murillo is an 80 year old patient with a history of diabetes who has a several year history of some gait instability. The patient fell about 6 weeks prior to this evaluation, and he has had a sudden decline in his ability to ambulate with weakness in both hands, and a significant difficulty in maintaining balance. The patient is being evaluated for a neuropathy or a possible radiculopathy. He does report some left-sided neck discomfort.  NERVE CONDUCTION STUDIES:  Nerve conduction studies were performed on both upper extremities. The distal motor latencies for the median nerves were prolonged bilaterally, with low motor amplitudes for these nerves bilaterally. The distal motor latencies for the ulnar nerves were prolonged bilaterally, with normal motor amplitudes for these nerves bilaterally. The F wave latencies for the median and ulnar nerves were prolonged bilaterally, with slowing seen for the right median nerve, and a normal nerve conduction velocity seen for the left median nerve and the right ulnar nerve. There was slowing above and below the elbow for the left ulnar nerve. The sensory latencies for the median nerves were absent on the right, prolonged on the left, and absent for the ulnar nerves bilaterally. The radial sensory latencies were prolonged bilaterally.  Nerve conduction studies were performed on the left lower extremity. The distal motor latency for the left peroneal nerve was normal, with a low motor amplitude. The distal motor latency for the left posterior tibial nerve was normal, with a low motor amplitude. Slowing was seen for left peroneal nerve, but the nerve conduction velocity could not be calculated for the left posterior tibial nerve. The F wave latency for the left peroneal nerve was prolonged. The left peroneal sensory latency was unobtainable.  EMG STUDIES:  EMG study was performed on the right upper extremity:  The first dorsal interosseous  muscle reveals 2 to 4 K units with full recruitment. No fibrillations or positive waves were noted. The abductor pollicis brevis muscle reveals 2 to 6 K units with decreased recruitment. One plus fibrillations and positive waves were noted. The extensor indicis proprius muscle reveals 1 to 3 K units with full recruitment. No fibrillations or positive waves were noted. The pronator teres muscle reveals 2 to 3 K units with full recruitment. No fibrillations or positive waves were noted. The biceps muscle reveals 1 to 2 K units with full recruitment. No fibrillations or positive waves were noted. The triceps muscle reveals 2 to 4 K units with full recruitment. No fibrillations or positive waves were noted. The anterior deltoid muscle reveals 2 to 3 K units with full recruitment. No fibrillations or positive waves were noted. The cervical paraspinal muscles were tested at 2 levels. No abnormalities of insertional activity were seen at either level tested. There was fair relaxation.   IMPRESSION:  Nerve conduction studies done on both upper extremities and on the left lower extremity shows findings consistent with a severe primarily axonal peripheral neuropathy. Overlying this study, there appears to be evidence of bilateral carpal tunnel syndrome of moderate severity bilaterally, more severe on the right than the left. EMG evaluation of the right upper extremity shows findings consistent with carpal tunnel syndrome, without evidence of an overlying cervical radiculopathy.  Marlan Palau MD 08/14/2014 4:02 PM  [facility] [address] [place], [place] [address]  Phone [phone] Fax [phone]

## 2014-08-16 ENCOUNTER — Encounter: Payer: Self-pay | Admitting: *Deleted

## 2014-08-20 ENCOUNTER — Telehealth: Payer: Self-pay | Admitting: Neurology

## 2014-08-20 ENCOUNTER — Encounter: Payer: Self-pay | Admitting: Neurology

## 2014-08-20 ENCOUNTER — Ambulatory Visit (INDEPENDENT_AMBULATORY_CARE_PROVIDER_SITE_OTHER): Payer: Medicare Other | Admitting: Neurology

## 2014-08-20 ENCOUNTER — Ambulatory Visit
Admission: RE | Admit: 2014-08-20 | Discharge: 2014-08-20 | Disposition: A | Discharge status: Home / Self Care | Payer: Medicare Other | Source: Ambulatory Visit | Attending: Neurology | Admitting: Neurology

## 2014-08-20 VITALS — BP 122/80 | HR 62

## 2014-08-20 DIAGNOSIS — R5381 Other malaise: Secondary | ICD-10-CM

## 2014-08-20 DIAGNOSIS — G8250 Quadriplegia, unspecified: Secondary | ICD-10-CM

## 2014-08-20 DIAGNOSIS — G8389 Other specified paralytic syndromes: Secondary | ICD-10-CM

## 2014-08-20 DIAGNOSIS — R269 Unspecified abnormalities of gait and mobility: Secondary | ICD-10-CM

## 2014-08-20 DIAGNOSIS — E1149 Type 2 diabetes mellitus with other diabetic neurological complication: Secondary | ICD-10-CM

## 2014-08-20 DIAGNOSIS — R531 Weakness: Secondary | ICD-10-CM

## 2014-08-20 DIAGNOSIS — S14123A Central cord syndrome at C3 level of cervical spinal cord, initial encounter: Secondary | ICD-10-CM

## 2014-08-20 DIAGNOSIS — R5383 Other fatigue: Secondary | ICD-10-CM

## 2014-08-20 HISTORY — DX: Quadriplegia, unspecified: G82.50

## 2014-08-20 NOTE — Telephone Encounter (Signed)
I called patient, left a message.the MRI the cervical spinal cord shows spinal cord compression at the C3-4 level. I would recommend an evaluation through a neurosurgeon, if they are amenable with this, they are to contact me.

## 2014-08-20 NOTE — Patient Instructions (Signed)

## 2014-08-20 NOTE — Progress Notes (Signed)
Reason for visit: Quadriparesis  Javier Murillo is a 80 y.o. male  History of present illness:  Javier Murillo is an 80 year old right-handed white male with a history of a progressive quadriparesis. The patient fell approximately 4-6 weeks ago, and he initially to used a cane for ambulation following the fall, but he reported some neck and shoulder discomfort, left greater right immediately following a fall. Within a week, the required the use of the wheelchair for mobilization. He was still able to stand and transfer with some assistance. Within the last 7 days, he has lost the ability to stand without support. The patient has a history of diabetes, and he has undergone an EMG and nerve conduction study through our office revealing evidence of a severe diabetic peripheral neuropathy. The patient does have bilateral carpal tunnel syndrome. He is losing the ability to use his arms completely, with weakness that is more severe on the left arm than the right. The patient has undergone MRI evaluation of the brain that shows mild to moderate small vessel ischemic changes that are chronic, without acute changes. A CT scan of the cervical spine did not show definite spinal cord compression. The patient reports no change in bladder control or bowel function, but he has had some constipation. The patient denies any pain radiating down the arms. He comes to this office for an evaluation. He is engaged in physical therapy.  Past Medical History  Diagnosis Date  . Diabetes mellitus without complication   . Hypertension   . Quadriplegia and quadriparesis 08/20/2014  . Diabetic peripheral neuropathy   . Gait disorder   . Peripheral vascular disease   . Degenerative arthritis   . Lumbosacral spondylosis   . Depression   . Chronic low back pain     Past Surgical History  Procedure Laterality Date  . Appendectomy    . Shoulder surgery Bilateral   . Cataract extraction w/ intraocular lens  implant,  bilateral    . Bursitis Bilateral     olecranon I&D  . Lumbar spine surgery      x7    Family History  Problem Relation Age of Onset  . Cancer Mother   . Heart attack Father   . Dementia Sister     Social history:  reports that he has quit smoking. His smoking use included Cigarettes. He has a 10 pack-year smoking history. He has never used smokeless tobacco. He reports that he does not drink alcohol or use illicit drugs.  Medications:  Current Outpatient Prescriptions on File Prior to Visit  Medication Sig Dispense Refill  . acetaminophen (TYLENOL) 325 MG tablet Take 650 mg by mouth 2 (two) times daily.      Marland Kitchen aspirin EC 81 MG tablet Take 81 mg by mouth daily.      Marland Kitchen atenolol (TENORMIN) 25 MG tablet Take 12.5 mg by mouth daily.      . cholecalciferol (VITAMIN D) 1000 UNITS tablet Take 1,000 Units by mouth daily.      . ferrous sulfate 325 (65 FE) MG tablet Take 325 mg by mouth daily with breakfast.      . FLUoxetine (PROZAC) 20 MG capsule Take 20 mg by mouth daily.      Marland Kitchen gabapentin (NEURONTIN) 100 MG capsule Take 100-200 mg by mouth 2 (two) times daily. 1 cap in the am and 2 cap in the eveing      . glyBURIDE (DIABETA) 5 MG tablet Take 2.5-5 mg by mouth 3 (  three) times daily.  in the am and at lunch, 2.5mg  in the evening      . insulin glargine (LANTUS) 100 UNIT/ML injection Inject 19 Units into the skin daily.      . meclizine (ANTIVERT) 12.5 MG tablet Take 1 tablet (12.5 mg total) by mouth 2 (two) times daily as needed for dizziness.  30 tablet  0  . Multiple Vitamin (MULTIVITAMIN WITH MINERALS) TABS tablet Take 1 tablet by mouth daily.      . vitamin C (ASCORBIC ACID) 500 MG tablet Take 500 mg by mouth 2 (two) times daily.      . vitamin E 400 UNIT capsule Take 400 Units by mouth daily.      . Zinc 50 MG TABS Take 50 mg by mouth 2 (two) times daily.       No current facility-administered medications on file prior to visit.     No Known Allergies  ROS:  Out of a  complete 14 system review of symptoms, the patient complains only of the following symptoms, and all other reviewed systems are negative.  Fatigue Constipation Numbness, weakness Decreased energy  Blood pressure 122/80, pulse 62, height 0' (0 m), weight 0 lb (0 kg).  Physical Exam  General: The patient is alert and cooperative at the time of the examination.  Eyes: Pupils are equal, round, and reactive to light. Discs are flat bilaterally.  Neck: The neck is supple, no carotid bruits are noted.  Respiratory: The respiratory examination is clear.  Cardiovascular: The cardiovascular examination reveals a regular rate and rhythm, no obvious murmurs or rubs are noted.  Skin: Extremities are without significant edema.  Neurologic Exam  Mental status: The patient is alert and oriented x 3 at the time of the examination. The patient has apparent normal recent and remote memory, with an apparently normal attention span and concentration ability.  Cranial nerves: Facial symmetry is present. There is good sensation of the face to pinprick and soft touch bilaterally. The strength of the facial muscles and the muscles to head turning and shoulder shrug are normal bilaterally. Speech is well enunciated, no aphasia or dysarthria is noted. Extraocular movements are full. Visual fields are full. The tongue is midline, and the patient has symmetric elevation of the soft palate. No obvious hearing deficits are noted.  Motor: The motor testing reveals 3/5 strength with grip on the right, 1/5 strength on the left. The patient has bilateral deltoid weakness, 4/5 on the right, 4 minus/5 on the left with weakness with external rotation of the arms bilaterally, worse on the left. The patient has 4/5 strength with biceps and triceps strength, again slightly worse on the left. With the lower extremities, there is near normal strength to direct testing, but there is trace weakness on the left.  Sensory: Sensory  testing is intact to pinprick, soft touch, vibration sensation, and position sense on all 4 extremities, with exception that there is a decrease in position sense in both feet. No evidence of extinction is noted.  Coordination: Cerebellar testing reveals good heel-to-shin bilaterally. The patient has difficulty performing finger-nose-finger bilaterally.  Gait and station: The patient requires assistance with standing. Once up, he has a tendency to lean backwards, he cannot stand independently, he is unable to ambulate.  Reflexes: Deep tendon reflexes are symmetric in arms, somewhat brisk bilaterally with the biceps and triceps reflexes. With the lower extremities, the knee jerk reflexes are well-maintained, and the right ankle jerk is maintained, absent on the  left. Toes are upgoing bilaterally, with a more prominent Babinski on the left.    MRI brain 07/24/14:  IMPRESSION:  1. No acute intracranial infarct or other abnormality identified.  2. Generalized age-related cerebral atrophy with chronic small  vessel ischemic disease.  3. Tiny remote lacunar infarcts within the right mid brain and right  periventricular white matter.    MRI lumbar 10/31/08:  IMPRESSION:  1. Status post resection of the large left paracentral disc  protrusion at L2-3. There does appear to be residual soft tissue,  likely representing residual or recurrent disc protrusion and  creating left lateral recess narrowing.  2. Progression of disc disease at L4-5 with left greater than  right lateral recess and foraminal narrowing. These findings have  progressed.  3. Status post left hemilaminotomy at L5-S1 with epidural  granulation tissue but concern for residual or recurrent right  paracentral disc protrusion narrowing the right lateral recess.  4. Moderate to severe biforaminal narrowing at L4-5 and L5-S1.  5. Progressive central canal stenosis at L3-4.  6. New central disc protrusion and progressive  broad-based disc  bulging at L1-2 with now mild central and left lateral recess  narrowing.     Assessment/Plan:  1. Progressive quadriparesis, probable central cord syndrome  2. Diabetic peripheral neuropathy, severe  3. Gait disorder  The patient has evidence of quadriparesis, with weakness that is more severe on the upper extremities, left greater right, with relative sparing of the lower extremities and with bladder control. The findings are most consistent with a central cord syndrome. The onset of the gait disorder has been progressive, following a fall. The patient will be set up for MRI evaluation of the cervical spine on an urgent basis. Further blood work will be done today. He will continue physical therapy. I will followup in 4-6 weeks.  Marlan Palau MD 08/20/2014 7:42 PM  [facility] [address] [place], [place] [address]  Phone [phone] Fax [phone]

## 2014-08-20 NOTE — Telephone Encounter (Signed)
I called the patient, I discussed the MRI results with him, the patient has C3-4 spinal cord compression, he is amenable to a neurosurgical referral. I'll try to get this set up.

## 2014-08-21 ENCOUNTER — Telehealth: Payer: Self-pay | Admitting: Neurology

## 2014-08-21 NOTE — Telephone Encounter (Signed)
Patient's daughter [name] calling to state that she received Dr. Anne Hahn' message yesterday and wants the referral sent to Dr. Newell Coral since he has already done surgeries on patient's back before and he would be more comfortable with him. She can also be reached at work at [phone] and press 0 for the operator.

## 2014-08-22 ENCOUNTER — Inpatient Hospital Stay (HOSPITAL_COMMUNITY)
Admission: AD | Admit: 2014-08-22 | Discharge: 2014-08-28 | DRG: 471 | Disposition: A | Discharge status: Rehabilitation Facility | Payer: Medicare Other | Source: Ambulatory Visit | Attending: Neurosurgery | Admitting: Neurosurgery

## 2014-08-22 ENCOUNTER — Inpatient Hospital Stay (HOSPITAL_COMMUNITY): Payer: Medicare Other

## 2014-08-22 ENCOUNTER — Telehealth: Payer: Self-pay | Admitting: *Deleted

## 2014-08-22 ENCOUNTER — Other Ambulatory Visit (HOSPITAL_COMMUNITY): Payer: Self-pay | Admitting: Neurosurgery

## 2014-08-22 ENCOUNTER — Encounter (HOSPITAL_COMMUNITY): Payer: Medicare Other | Admitting: Anesthesiology

## 2014-08-22 ENCOUNTER — Encounter (HOSPITAL_COMMUNITY): Payer: Self-pay | Admitting: *Deleted

## 2014-08-22 ENCOUNTER — Encounter (HOSPITAL_COMMUNITY)
Admission: AD | Disposition: A | Discharge status: Rehabilitation Facility | Payer: Self-pay | Source: Ambulatory Visit | Attending: Neurosurgery

## 2014-08-22 ENCOUNTER — Inpatient Hospital Stay (HOSPITAL_COMMUNITY): Payer: Medicare Other | Admitting: Anesthesiology

## 2014-08-22 DIAGNOSIS — E785 Hyperlipidemia, unspecified: Secondary | ICD-10-CM | POA: Diagnosis present

## 2014-08-22 DIAGNOSIS — I10 Essential (primary) hypertension: Secondary | ICD-10-CM | POA: Diagnosis present

## 2014-08-22 DIAGNOSIS — M4712 Other spondylosis with myelopathy, cervical region: Principal | ICD-10-CM | POA: Diagnosis present

## 2014-08-22 DIAGNOSIS — Q762 Congenital spondylolisthesis: Secondary | ICD-10-CM | POA: Diagnosis not present

## 2014-08-22 DIAGNOSIS — Z7982 Long term (current) use of aspirin: Secondary | ICD-10-CM | POA: Diagnosis not present

## 2014-08-22 DIAGNOSIS — E1142 Type 2 diabetes mellitus with diabetic polyneuropathy: Secondary | ICD-10-CM | POA: Diagnosis present

## 2014-08-22 DIAGNOSIS — M5000 Cervical disc disorder with myelopathy, unspecified cervical region: Secondary | ICD-10-CM | POA: Diagnosis present

## 2014-08-22 DIAGNOSIS — G8250 Quadriplegia, unspecified: Secondary | ICD-10-CM | POA: Diagnosis present

## 2014-08-22 DIAGNOSIS — Z79899 Other long term (current) drug therapy: Secondary | ICD-10-CM | POA: Diagnosis not present

## 2014-08-22 DIAGNOSIS — Z87891 Personal history of nicotine dependence: Secondary | ICD-10-CM

## 2014-08-22 DIAGNOSIS — E1149 Type 2 diabetes mellitus with other diabetic neurological complication: Secondary | ICD-10-CM | POA: Diagnosis present

## 2014-08-22 DIAGNOSIS — Z794 Long term (current) use of insulin: Secondary | ICD-10-CM

## 2014-08-22 DIAGNOSIS — R29898 Other symptoms and signs involving the musculoskeletal system: Secondary | ICD-10-CM | POA: Diagnosis present

## 2014-08-22 HISTORY — DX: Hyperlipidemia, unspecified: E78.5

## 2014-08-22 HISTORY — DX: Calculus of kidney: N20.0

## 2014-08-22 HISTORY — DX: Unspecified osteoarthritis, unspecified site: M19.90

## 2014-08-22 HISTORY — PX: ANTERIOR CERVICAL DECOMP/DISCECTOMY FUSION: SHX1161

## 2014-08-22 LAB — CBC
HCT: 38.6 % — ABNORMAL LOW (ref 39.0–52.0)
Hemoglobin: 12.6 g/dL — ABNORMAL LOW (ref 13.0–17.0)
MCH: 32.6 pg (ref 26.0–34.0)
MCHC: 32.6 g/dL (ref 30.0–36.0)
MCV: 100 fL (ref 78.0–100.0)
Platelets: 270 10*3/uL (ref 150–400)
RBC: 3.86 MIL/uL — ABNORMAL LOW (ref 4.22–5.81)
RDW: 12.8 % (ref 11.5–15.5)
WBC: 9.5 10*3/uL (ref 4.0–10.5)

## 2014-08-22 LAB — COMPREHENSIVE METABOLIC PANEL
ALT: 33 U/L (ref 0–53)
AST: 26 U/L (ref 0–37)
Albumin: 3.6 g/dL (ref 3.5–5.2)
Alkaline Phosphatase: 78 U/L (ref 39–117)
Anion gap: 12 (ref 5–15)
BUN: 34 mg/dL — ABNORMAL HIGH (ref 6–23)
CO2: 25 mEq/L (ref 19–32)
Calcium: 9.6 mg/dL (ref 8.4–10.5)
Chloride: 101 mEq/L (ref 96–112)
Creatinine, Ser: 1.11 mg/dL (ref 0.50–1.35)
GFR calc Af Amer: 69 mL/min — ABNORMAL LOW (ref >90)
GFR calc non Af Amer: 60 mL/min — ABNORMAL LOW (ref >90)
Glucose, Bld: 156 mg/dL — ABNORMAL HIGH (ref 70–99)
Potassium: 4.5 mEq/L (ref 3.7–5.3)
Sodium: 138 mEq/L (ref 137–147)
Total Bilirubin: 0.2 mg/dL — ABNORMAL LOW (ref 0.3–1.2)
Total Protein: 6.4 g/dL (ref 6.0–8.3)

## 2014-08-22 LAB — GLUCOSE, CAPILLARY
Glucose-Capillary: 161 mg/dL — ABNORMAL HIGH (ref 70–99)
Glucose-Capillary: 114 mg/dL — ABNORMAL HIGH (ref 70–99)
Glucose-Capillary: 167 mg/dL — ABNORMAL HIGH (ref 70–99)

## 2014-08-22 SURGERY — ANTERIOR CERVICAL DECOMPRESSION/DISCECTOMY FUSION 1 LEVEL
Anesthesia: General | Site: Neck

## 2014-08-22 MED ORDER — DEXAMETHASONE SODIUM PHOSPHATE 4 MG/ML IJ SOLN
INTRAMUSCULAR | Status: AC
  Filled 2014-08-22: qty 3

## 2014-08-22 MED ORDER — DEXAMETHASONE SODIUM PHOSPHATE 4 MG/ML IJ SOLN
INTRAMUSCULAR | Status: DC | PRN
  Administered 2014-08-22: 10 mg via INTRAVENOUS

## 2014-08-22 MED ORDER — LACTATED RINGERS IV SOLN
INTRAVENOUS | Status: DC
  Administered 2014-08-22 (×2): via INTRAVENOUS

## 2014-08-22 MED ORDER — SODIUM CHLORIDE 0.9 % IJ SOLN
INTRAMUSCULAR | Status: AC
  Filled 2014-08-22: qty 10

## 2014-08-22 MED ORDER — OXYCODONE-ACETAMINOPHEN 5-325 MG PO TABS
1.0000 | ORAL_TABLET | ORAL | Status: DC | PRN
  Administered 2014-08-24: 2 via ORAL
  Filled 2014-08-22: qty 2

## 2014-08-22 MED ORDER — FENTANYL CITRATE 0.05 MG/ML IJ SOLN
INTRAMUSCULAR | Status: AC
  Filled 2014-08-22: qty 5

## 2014-08-22 MED ORDER — ONDANSETRON HCL 4 MG/2ML IJ SOLN: 4.0000 mg | Freq: Four times a day (QID) | INTRAMUSCULAR | Status: DC | PRN

## 2014-08-22 MED ORDER — ATENOLOL 12.5 MG HALF TABLET
12.5000 mg | ORAL_TABLET | Freq: Every morning | ORAL | Status: DC
  Administered 2014-08-23 – 2014-08-28 (×6): 12.5 mg via ORAL
  Filled 2014-08-22 (×6): qty 1

## 2014-08-22 MED ORDER — ACETAMINOPHEN 325 MG PO TABS: 650.0000 mg | ORAL_TABLET | ORAL | Status: DC | PRN

## 2014-08-22 MED ORDER — EPHEDRINE SULFATE 50 MG/ML IJ SOLN
INTRAMUSCULAR | Status: AC
  Filled 2014-08-22: qty 1

## 2014-08-22 MED ORDER — ACETAMINOPHEN 10 MG/ML IV SOLN
INTRAVENOUS | Status: DC | PRN
  Administered 2014-08-22: 1000 mg via INTRAVENOUS

## 2014-08-22 MED ORDER — LIDOCAINE HCL (CARDIAC) 20 MG/ML IV SOLN
INTRAVENOUS | Status: DC | PRN
  Administered 2014-08-22: 40 mg via INTRAVENOUS

## 2014-08-22 MED ORDER — SODIUM CHLORIDE 0.9 % IJ SOLN: 3.0000 mL | INTRAMUSCULAR | Status: DC | PRN

## 2014-08-22 MED ORDER — SODIUM CHLORIDE 0.9 % IV SOLN: INTRAVENOUS | Status: DC

## 2014-08-22 MED ORDER — ROCURONIUM BROMIDE 50 MG/5ML IV SOLN
INTRAVENOUS | Status: AC
  Filled 2014-08-22: qty 1

## 2014-08-22 MED ORDER — MAGNESIUM HYDROXIDE 400 MG/5ML PO SUSP: 30.0000 mL | Freq: Every day | ORAL | Status: DC | PRN

## 2014-08-22 MED ORDER — CEFAZOLIN SODIUM-DEXTROSE 2-3 GM-% IV SOLR
2.0000 g | INTRAVENOUS | Status: AC
  Administered 2014-08-22: 2 g via INTRAVENOUS
  Filled 2014-08-22: qty 50

## 2014-08-22 MED ORDER — ARTIFICIAL TEARS OP OINT
TOPICAL_OINTMENT | OPHTHALMIC | Status: AC
  Filled 2014-08-22: qty 3.5

## 2014-08-22 MED ORDER — BUPIVACAINE HCL (PF) 0.5 % IJ SOLN
INTRAMUSCULAR | Status: DC | PRN
  Administered 2014-08-22: 5 mL

## 2014-08-22 MED ORDER — ACETAMINOPHEN 10 MG/ML IV SOLN
INTRAVENOUS | Status: AC
  Filled 2014-08-22: qty 100

## 2014-08-22 MED ORDER — DEXAMETHASONE SODIUM PHOSPHATE 4 MG/ML IJ SOLN
4.0000 mg | Freq: Four times a day (QID) | INTRAMUSCULAR | Status: AC
  Administered 2014-08-23 (×4): 4 mg via INTRAVENOUS
  Filled 2014-08-22 (×6): qty 1

## 2014-08-22 MED ORDER — FLUOXETINE HCL 20 MG PO CAPS
20.0000 mg | ORAL_CAPSULE | Freq: Every morning | ORAL | Status: DC
  Administered 2014-08-23 – 2014-08-28 (×6): 20 mg via ORAL
  Filled 2014-08-22 (×6): qty 1

## 2014-08-22 MED ORDER — SODIUM CHLORIDE 0.9 % IJ SOLN
3.0000 mL | Freq: Two times a day (BID) | INTRAMUSCULAR | Status: DC
Start: 2014-08-22 — End: 2014-08-28
  Administered 2014-08-24 – 2014-08-28 (×8): 3 mL via INTRAVENOUS

## 2014-08-22 MED ORDER — SODIUM CHLORIDE 0.9 % IV SOLN: 250.0000 mL | INTRAVENOUS | Status: DC

## 2014-08-22 MED ORDER — ACETAMINOPHEN 650 MG RE SUPP: 650.0000 mg | RECTAL | Status: DC | PRN

## 2014-08-22 MED ORDER — LIDOCAINE HCL (CARDIAC) 20 MG/ML IV SOLN
INTRAVENOUS | Status: AC
  Filled 2014-08-22: qty 5

## 2014-08-22 MED ORDER — HEMOSTATIC AGENTS (NO CHARGE) OPTIME
TOPICAL | Status: DC | PRN
  Administered 2014-08-22: 1 via TOPICAL

## 2014-08-22 MED ORDER — BISACODYL 10 MG RE SUPP: 10.0000 mg | Freq: Every day | RECTAL | Status: DC | PRN

## 2014-08-22 MED ORDER — 0.9 % SODIUM CHLORIDE (POUR BTL) OPTIME
TOPICAL | Status: DC | PRN
  Administered 2014-08-22: 1000 mL

## 2014-08-22 MED ORDER — VITAMIN D3 25 MCG (1000 UNIT) PO TABS
1000.0000 [IU] | ORAL_TABLET | Freq: Every morning | ORAL | Status: DC
  Administered 2014-08-23 – 2014-08-28 (×6): 1000 [IU] via ORAL
  Filled 2014-08-22 (×6): qty 1

## 2014-08-22 MED ORDER — LIDOCAINE-EPINEPHRINE 1 %-1:100000 IJ SOLN
INTRAMUSCULAR | Status: DC | PRN
  Administered 2014-08-22: 5 mL via INTRADERMAL

## 2014-08-22 MED ORDER — THROMBIN 5000 UNITS EX SOLR
CUTANEOUS | Status: DC | PRN
  Administered 2014-08-22 (×2): 5000 [IU] via TOPICAL

## 2014-08-22 MED ORDER — FENTANYL CITRATE 0.05 MG/ML IJ SOLN
INTRAMUSCULAR | Status: DC | PRN
  Administered 2014-08-22 (×2): 50 ug via INTRAVENOUS

## 2014-08-22 MED ORDER — PHENYLEPHRINE 40 MCG/ML (10ML) SYRINGE FOR IV PUSH (FOR BLOOD PRESSURE SUPPORT)
PREFILLED_SYRINGE | INTRAVENOUS | Status: AC
  Filled 2014-08-22: qty 10

## 2014-08-22 MED ORDER — PHENYLEPHRINE HCL 10 MG/ML IJ SOLN
INTRAMUSCULAR | Status: DC | PRN
  Administered 2014-08-22: 80 ug via INTRAVENOUS

## 2014-08-22 MED ORDER — ALUM & MAG HYDROXIDE-SIMETH 200-200-20 MG/5ML PO SUSP: 30.0000 mL | Freq: Four times a day (QID) | ORAL | Status: DC | PRN

## 2014-08-22 MED ORDER — PROPOFOL 10 MG/ML IV BOLUS
INTRAVENOUS | Status: AC
  Filled 2014-08-22: qty 20

## 2014-08-22 MED ORDER — HYDROCODONE-ACETAMINOPHEN 5-325 MG PO TABS
1.0000 | ORAL_TABLET | ORAL | Status: DC | PRN
  Administered 2014-08-23 – 2014-08-25 (×2): 2 via ORAL
  Filled 2014-08-22 (×2): qty 2

## 2014-08-22 MED ORDER — SUCCINYLCHOLINE CHLORIDE 20 MG/ML IJ SOLN
INTRAMUSCULAR | Status: DC | PRN
Start: 2014-08-22 — End: 2014-08-23
  Administered 2014-08-22: 100 mg via INTRAVENOUS

## 2014-08-22 MED ORDER — GLYCOPYRROLATE 0.2 MG/ML IJ SOLN
INTRAMUSCULAR | Status: DC | PRN
  Administered 2014-08-22: 0.2 mg via INTRAVENOUS

## 2014-08-22 MED ORDER — PHENOL 1.4 % MT LIQD: 1.0000 | OROMUCOSAL | Status: DC | PRN

## 2014-08-22 MED ORDER — FLUTICASONE PROPIONATE 50 MCG/ACT NA SUSP
2.0000 | Freq: Every day | NASAL | Status: DC
  Administered 2014-08-23 – 2014-08-28 (×6): 2 via NASAL
  Filled 2014-08-22: qty 16

## 2014-08-22 MED ORDER — EPHEDRINE SULFATE 50 MG/ML IJ SOLN
INTRAMUSCULAR | Status: DC | PRN
  Administered 2014-08-22 (×4): 10 mg via INTRAVENOUS

## 2014-08-22 MED ORDER — MORPHINE SULFATE 4 MG/ML IJ SOLN: 4.0000 mg | INTRAMUSCULAR | Status: DC | PRN

## 2014-08-22 MED ORDER — MENTHOL 3 MG MT LOZG: 1.0000 | LOZENGE | OROMUCOSAL | Status: DC | PRN

## 2014-08-22 MED ORDER — INSULIN ASPART 100 UNIT/ML ~~LOC~~ SOLN
0.0000 [IU] | SUBCUTANEOUS | Status: DC
  Administered 2014-08-23 (×2): 8 [IU] via SUBCUTANEOUS
  Administered 2014-08-23: 11 [IU] via SUBCUTANEOUS
  Administered 2014-08-23 (×2): 5 [IU] via SUBCUTANEOUS
  Administered 2014-08-23: 8 [IU] via SUBCUTANEOUS
  Administered 2014-08-24: 5 [IU] via SUBCUTANEOUS
  Administered 2014-08-24: 3 [IU] via SUBCUTANEOUS
  Administered 2014-08-24 (×2): 5 [IU] via SUBCUTANEOUS
  Administered 2014-08-24 (×2): 3 [IU] via SUBCUTANEOUS
  Administered 2014-08-24: 8 [IU] via SUBCUTANEOUS
  Administered 2014-08-25 (×4): 3 [IU] via SUBCUTANEOUS
  Administered 2014-08-25: 5 [IU] via SUBCUTANEOUS
  Administered 2014-08-26: 2 [IU] via SUBCUTANEOUS
  Administered 2014-08-26: 5 [IU] via SUBCUTANEOUS
  Administered 2014-08-26: 3 [IU] via SUBCUTANEOUS
  Administered 2014-08-26: 2 [IU] via SUBCUTANEOUS
  Administered 2014-08-26: 11 [IU] via SUBCUTANEOUS
  Administered 2014-08-27: 15 [IU] via SUBCUTANEOUS
  Administered 2014-08-27 (×2): 2 [IU] via SUBCUTANEOUS
  Administered 2014-08-27: 3 [IU] via SUBCUTANEOUS
  Administered 2014-08-27: 11 [IU] via SUBCUTANEOUS

## 2014-08-22 MED ORDER — BACITRACIN 50000 UNITS IM SOLR
INTRAMUSCULAR | Status: DC | PRN
  Administered 2014-08-22: 20:00:00

## 2014-08-22 MED ORDER — HYDROMORPHONE HCL 1 MG/ML IJ SOLN: 0.2500 mg | INTRAMUSCULAR | Status: DC | PRN

## 2014-08-22 MED ORDER — FERROUS SULFATE 325 (65 FE) MG PO TABS
325.0000 mg | ORAL_TABLET | Freq: Every day | ORAL | Status: DC
  Administered 2014-08-23 – 2014-08-28 (×6): 325 mg via ORAL
  Filled 2014-08-22 (×7): qty 1

## 2014-08-22 MED ORDER — PROPOFOL 10 MG/ML IV BOLUS
INTRAVENOUS | Status: DC | PRN
  Administered 2014-08-22: 90 mg via INTRAVENOUS

## 2014-08-22 MED ORDER — PANTOPRAZOLE SODIUM 40 MG IV SOLR
40.0000 mg | Freq: Every day | INTRAVENOUS | Status: DC
  Administered 2014-08-23 (×2): 40 mg via INTRAVENOUS
  Filled 2014-08-22 (×4): qty 40

## 2014-08-22 MED ORDER — SUCCINYLCHOLINE CHLORIDE 20 MG/ML IJ SOLN
INTRAMUSCULAR | Status: AC
  Filled 2014-08-22: qty 1

## 2014-08-22 MED ORDER — HYDROXYZINE HCL 50 MG PO TABS
50.0000 mg | ORAL_TABLET | ORAL | Status: DC | PRN
  Filled 2014-08-22: qty 1

## 2014-08-22 MED ORDER — THROMBIN 5000 UNITS EX SOLR
OROMUCOSAL | Status: DC | PRN
  Administered 2014-08-22: 21:00:00 via TOPICAL

## 2014-08-22 SURGICAL SUPPLY — 62 items
ADH SKN CLS APL DERMABOND .7 (GAUZE/BANDAGES/DRESSINGS) ×1
BAG DECANTER FOR FLEXI CONT (MISCELLANEOUS) ×2 IMPLANT
BIT DRILL NEURO 2X3.1 SFT TUCH (MISCELLANEOUS) ×1 IMPLANT
BLADE SURG 10 STRL SS (BLADE) ×1 IMPLANT
BLADE ULTRA TIP 2M (BLADE) ×2 IMPLANT
BRUSH SCRUB EZ PLAIN DRY (MISCELLANEOUS) ×2 IMPLANT
CANISTER SUCT 3000ML (MISCELLANEOUS) ×2 IMPLANT
CONT SPEC 4OZ CLIKSEAL STRL BL (MISCELLANEOUS) ×2 IMPLANT
COVER MAYO STAND STRL (DRAPES) ×2 IMPLANT
DECANTER SPIKE VIAL GLASS SM (MISCELLANEOUS) ×2 IMPLANT
DERMABOND ADVANCED (GAUZE/BANDAGES/DRESSINGS) ×1
DERMABOND ADVANCED .7 DNX12 (GAUZE/BANDAGES/DRESSINGS) ×1 IMPLANT
DRAPE LAPAROTOMY 100X72 PEDS (DRAPES) ×2 IMPLANT
DRAPE MICROSCOPE LEICA (MISCELLANEOUS) ×2 IMPLANT
DRAPE POUCH INSTRU U-SHP 10X18 (DRAPES) ×2 IMPLANT
DRAPE PROXIMA HALF (DRAPES) IMPLANT
DRILL NEURO 2X3.1 SOFT TOUCH (MISCELLANEOUS) ×2
ELECT COATED BLADE 2.86 ST (ELECTRODE) ×2 IMPLANT
ELECT REM PT RETURN 9FT ADLT (ELECTROSURGICAL) ×2
ELECTRODE REM PT RTRN 9FT ADLT (ELECTROSURGICAL) ×1 IMPLANT
GLOVE BIO SURGEON STRL SZ 6.5 (GLOVE) ×1 IMPLANT
GLOVE BIO SURGEON STRL SZ8 (GLOVE) ×1 IMPLANT
GLOVE BIOGEL PI IND STRL 6.5 (GLOVE) IMPLANT
GLOVE BIOGEL PI IND STRL 8 (GLOVE) ×1 IMPLANT
GLOVE BIOGEL PI IND STRL 8.5 (GLOVE) IMPLANT
GLOVE BIOGEL PI INDICATOR 6.5 (GLOVE) ×1
GLOVE BIOGEL PI INDICATOR 8 (GLOVE) ×1
GLOVE BIOGEL PI INDICATOR 8.5 (GLOVE) ×1
GLOVE ECLIPSE 7.5 STRL STRAW (GLOVE) ×2 IMPLANT
GLOVE EXAM NITRILE LRG STRL (GLOVE) IMPLANT
GLOVE EXAM NITRILE MD LF STRL (GLOVE) IMPLANT
GLOVE EXAM NITRILE XL STR (GLOVE) IMPLANT
GLOVE EXAM NITRILE XS STR PU (GLOVE) IMPLANT
GOWN STRL REUS W/ TWL LRG LVL3 (GOWN DISPOSABLE) IMPLANT
GOWN STRL REUS W/ TWL XL LVL3 (GOWN DISPOSABLE) IMPLANT
GOWN STRL REUS W/TWL 2XL LVL3 (GOWN DISPOSABLE) IMPLANT
GOWN STRL REUS W/TWL LRG LVL3 (GOWN DISPOSABLE) ×4
GOWN STRL REUS W/TWL XL LVL3 (GOWN DISPOSABLE) ×2
GRAFT CORT CANC 9X14X11MM (Bone Implant) ×1 IMPLANT
HALTER HD/CHIN CERV TRACTION D (MISCELLANEOUS) ×2 IMPLANT
KIT BASIN OR (CUSTOM PROCEDURE TRAY) ×2 IMPLANT
KIT ROOM TURNOVER OR (KITS) ×2 IMPLANT
NDL HYPO 25X1 1.5 SAFETY (NEEDLE) ×1 IMPLANT
NDL SPNL 22GX3.5 QUINCKE BK (NEEDLE) ×1 IMPLANT
NEEDLE HYPO 25X1 1.5 SAFETY (NEEDLE) ×2 IMPLANT
NEEDLE SPNL 22GX3.5 QUINCKE BK (NEEDLE) ×2 IMPLANT
NS IRRIG 1000ML POUR BTL (IV SOLUTION) ×2 IMPLANT
PACK LAMINECTOMY NEURO (CUSTOM PROCEDURE TRAY) ×2 IMPLANT
PAD ARMBOARD 7.5X6 YLW CONV (MISCELLANEOUS) ×6 IMPLANT
PLATE CERVICAL 18MM (Plate) ×1 IMPLANT
RUBBERBAND STERILE (MISCELLANEOUS) ×4 IMPLANT
SCREW FIX 4.0X15MM (Screw) ×2 IMPLANT
SCREW VAR 4.0X15MM (Screw) ×2 IMPLANT
SPONGE INTESTINAL PEANUT (DISPOSABLE) ×3 IMPLANT
SPONGE SURGIFOAM ABS GEL SZ50 (HEMOSTASIS) ×2 IMPLANT
STAPLER SKIN PROX WIDE 3.9 (STAPLE) ×1 IMPLANT
SUT VIC AB 2-0 CP2 18 (SUTURE) ×2 IMPLANT
SUT VIC AB 3-0 SH 8-18 (SUTURE) ×2 IMPLANT
SYR 20ML ECCENTRIC (SYRINGE) ×2 IMPLANT
TOWEL OR 17X24 6PK STRL BLUE (TOWEL DISPOSABLE) IMPLANT
TOWEL OR 17X26 10 PK STRL BLUE (TOWEL DISPOSABLE) ×2 IMPLANT
WATER STERILE IRR 1000ML POUR (IV SOLUTION) ×2 IMPLANT

## 2014-08-22 NOTE — Progress Notes (Signed)
Orthopedic Tech Progress Note Patient Details:  Javier Murillo 26-Feb-1930 [id number]  Ortho Devices Type of Ortho Device: Soft collar Ortho Device/Splint Location: neck Ortho Device/Splint Interventions: Ordered As ordered by Dr. Lavinia Sharps, Javier Murillo 08/22/2014, 8:25 PM

## 2014-08-22 NOTE — Progress Notes (Signed)
08/22/14 1535  OBSTRUCTIVE SLEEP APNEA  Have you ever been diagnosed with sleep apnea through a sleep study? No  Do you snore loudly (loud enough to be heard through closed doors)?  1  Do you often feel tired, fatigued, or sleepy during the daytime? 0  Has anyone observed you stop breathing during your sleep? 0  Do you have, or are you being treated for high blood pressure? 1  Age over 78 years old? 1  Neck circumference greater than 40 cm/16 inches? 1  Gender: 1  Obstructive Sleep Apnea Score 5  Score 4 or greater  Results sent to PCP

## 2014-08-22 NOTE — Progress Notes (Signed)
eLink Physician-Brief Progress Note Patient Name: Javier Murillo DOB: Jul 20, 1930 MRN: [id number]   Date of Service  08/22/2014  HPI/Events of Note  80 yo male with C3-4 gherniation s\p C3-4 anterior cervical decompression and arthrodesis with structural allograft and tether cervical plating   eICU Interventions  1. Cervical herniation - s\p decompression - pain management  2. HTN - on atenolol at home, may restart home med  3. DM - IDDM - start SSI     Intervention Category Evaluation Type: New Patient Evaluation  Kian Gamarra 08/22/2014, 11:41 PM

## 2014-08-22 NOTE — Telephone Encounter (Signed)
Results of labs faxed 08-21-14. Copy to Dr. Anne Hahn thru Medical records.

## 2014-08-22 NOTE — Anesthesia Preprocedure Evaluation (Addendum)
Anesthesia Evaluation  Patient identified by MRN, date of birth, ID band Patient awake    Reviewed: Allergy & Precautions, H&P , NPO status , Patient's Chart, lab work & pertinent test results, reviewed documented beta blocker date and time   Airway Mallampati: II TM Distance: >3 FB Neck ROM: Limited    Dental no notable dental hx. (+) Teeth Intact, Dental Advisory Given   Pulmonary neg pulmonary ROS, former smoker,  breath sounds clear to auscultation  Pulmonary exam normal       Cardiovascular hypertension, Pt. on medications and Pt. on home beta blockers Rhythm:Regular Rate:Normal     Neuro/Psych negative neurological ROS  negative psych ROS   GI/Hepatic negative GI ROS, Neg liver ROS,   Endo/Other  diabetes, Type 1, Insulin Dependent  Renal/GU negative Renal ROS  negative genitourinary   Musculoskeletal   Abdominal   Peds  Hematology negative hematology ROS (+)   Anesthesia Other Findings   Reproductive/Obstetrics negative OB ROS                          Anesthesia Physical Anesthesia Plan  ASA: III  Anesthesia Plan: General   Post-op Pain Management:    Induction: Intravenous  Airway Management Planned: Oral ETT  Additional Equipment:   Intra-op Plan:   Post-operative Plan: Extubation in OR and Possible Post-op intubation/ventilation  Informed Consent: I have reviewed the patients History and Physical, chart, labs and discussed the procedure including the risks, benefits and alternatives for the proposed anesthesia with the patient or authorized representative who has indicated his/her understanding and acceptance.   Dental advisory given  Plan Discussed with: CRNA  Anesthesia Plan Comments:        Anesthesia Quick Evaluation

## 2014-08-22 NOTE — Progress Notes (Signed)
Subjective: Patient arrives in PACU after extubation in OR. Breathing on his own, on room air, with O2 sat of 96%.  Objective: Vital signs in last 24 hours: Filed Vitals:   08/22/14 1539  BP: 151/65  Pulse: 57  Temp: 97 F (36.1 C)  TempSrc: Oral  Resp: 18  Height:  (1.727 m)  Weight: 72.576 kg (160 lb)  SpO2: 96%    Intake/Output from previous day:   Intake/Output this shift: Total I/O In: 1000 [I.V.:1000] Out: 500 [Urine:500]  Physical Exam:  Wound clean and dry. Drowsy, opening eyes to voice.  Follows some simple commands. Moving both lower extremities with plantarflexion in the feet bilaterally.  CBC  Recent Labs  08/22/14 1601  WBC 9.5  HGB 12.6*  HCT 38.6*  PLT 270   BMET  Recent Labs  08/22/14 1601  NA 138  K 4.5  CL 101  CO2 25  GLUCOSE 156*  BUN 34*  CREATININE 1.11  CALCIUM 9.6   Studies/Results: Dg Cervical Spine 2-3 Views  08/22/2014   CLINICAL DATA:  Cervical disc disease.  EXAM: CERVICAL SPINE - 2-3 VIEW  COMPARISON:  None.  FINDINGS: Radiograph 1 demonstrates a needle at the C3-4 level. Radiograph 2 demonstrates the patient has undergone anterior cervical fusion at C3-4. Alignment is now near anatomic. Anterior plate and screws and interbody plug are in place.  IMPRESSION: Anterior fusion performed at C3-4.  Improved alignment.   Electronically Signed   By: Geanie Cooley M.D.   On: 08/22/2014 21:20    Assessment/Plan: Stable in the initial phase following surgery. To be transferred from PACU, once stable to the neurosurgical ICU. I spoke with the patient's family about surgery, intraoperative findings, and initial postoperative condition.   Hewitt Shorts, MD 08/22/2014, 9:45 PM

## 2014-08-22 NOTE — H&P (Signed)
Subjective: Patient is a 80 y.o. male who is admitted for treatment of cervical stenosis with myelopathy with quadriparesis, secondary to a C3-4 cervical disc herniation superimposed upon a C3 on C4 spondylolisthesis. Patient's difficult to begin after he fell 07/14/2014. Since then he's had a steady progressively worsening weakness in the upper extremities worse than in the lower extremities. He went from ambulating with a single-point cane to requiring a pair single-point cane to 2 requiring a walker to requiring a wheelchair. He can no longer feed himself because the weakness in the upper extremities, he requires assistance with all ADLs including bathing, dressing, and toileting. MRI done this week revealed multilevel spondylosis and degenerative disease was following disc protrusions at all levels, but with a C3 on C4 spondylolisthesis, a C3-4 cervical disc herniation, and resulting severe stenosis at the C3-4 level. His situation was discussed at length with the patient, his daughter, and his son-in-law including alternatives of comfort care versus surgical intervention via a single level CIII-4 anterior cervical decompression and arthrodesis with allograft and cervical plating. We discussed risks of each of these approaches, and after further consultation by the family with other family members, the patient and his family wish [place] to proceed with surgery, and the patient is admitted for such.   Past Medical History  Diagnosis Date  . Kidney stones     only once  . Hypertension   . Hyperlipidemia   . Arthritis   . Diabetes mellitus without complication     Past Surgical History  Procedure Laterality Date  . Back surgery      x 7  . Eye surgery      bilateral cataracts  . Bone spur removed  right sholder    . Appendectomy      Prescriptions prior to admission  Medication Sig Dispense Refill  . acetaminophen (TYLENOL) 325 MG tablet Take 650 mg by mouth 2 (two) times daily.      Marland Kitchen aspirin  EC 81 MG tablet Take 81 mg by mouth every morning.      Marland Kitchen atenolol (TENORMIN) 25 MG tablet Take 12.5 mg by mouth every morning.      . cholecalciferol (VITAMIN D) 1000 UNITS tablet Take 1,000 Units by mouth every morning.      . ferrous sulfate 325 (65 FE) MG tablet Take 325 mg by mouth daily with breakfast.      . FLUoxetine (PROZAC) 20 MG capsule Take 20 mg by mouth every morning.      . fluticasone (FLONASE) 50 MCG/ACT nasal spray Place 2 sprays into both nostrils daily.      Marland Kitchen gabapentin (NEURONTIN) 100 MG capsule Take 100-200 mg by mouth 2 (two) times daily. *takes  in the morning and  in the evening*      . glyBURIDE (DIABETA) 2.5 MG tablet Take 2.5 mg by mouth every evening.      . glyBURIDE (DIABETA) 5 MG tablet Take 5 mg by mouth 2 (two) times daily with a meal. *takes at breakfast and lunch*      . insulin aspart (NOVOLOG) 100 UNIT/ML injection Inject 0-8 Units into the skin 3 (three) times daily before meals. <100=0 units, 100-150=2 units, 151-200=4 units, 201-250=6 units, >250= 8 units      . insulin glargine (LANTUS) 100 UNIT/ML injection Inject 19 Units into the skin every morning.      . meclizine (ANTIVERT) 12.5 MG tablet Take 12.5 mg by mouth 2 (two) times daily as needed for dizziness.      Marland Kitchen  Multiple Vitamins-Minerals (MULTIVITAMIN PO) Take 1 tablet by mouth daily.      . vitamin C (ASCORBIC ACID) 500 MG tablet Take 500 mg by mouth 2 (two) times daily.      . vitamin E 400 UNIT capsule Take 400 Units by mouth daily.      Marland Kitchen zinc gluconate 50 MG tablet Take 50 mg by mouth 2 (two) times daily.       Allergies  Allergen Reactions  . Fluvirin [Influenza Virus Vaccine Split]     Gave patient flu    History  Substance Use Topics  . Smoking status: Former Games developer  . Smokeless tobacco: Not on file  . Alcohol Use: No     Comment: very rare    History reviewed. No pertinent family history.   Review of Systems A comprehensive review of systems was negative.  Other than  for those difficulties described in his history of present illness and past medical history.  Objective: Vital signs in last 24 hours: Temp:  [97 F (36.1 C)] 97 F (36.1 C) (09/16 1539) Pulse Rate:  [57] 57 (09/16 1539) Resp:  [18] 18 (09/16 1539) BP: (151)/(65) 151/65 mmHg (09/16 1539) SpO2:  [96 %] 96 % (09/16 1539) Weight:  [72.576 kg (160 lb)] 72.576 kg (160 lb) (09/16 1539)  EXAM: Patient is an elderly white male in no acute distress. Lungs are somewhat distant breath sounds with somewhat shallow respirations. Heart has a regular rate and rhythm, normal S1-S2, there is no murmur. Abdomen soft, nondistended, bowel sounds are present. Extremity examination shows no clubbing, cyanosis, or edema. Musculoskeletal examination shows fairly good range of motion neck flexion and extension. There is no tenderness to palpation of the cervical spinous processes. Mental status shows the patient is awake and alert, he is oriented to his name, [employer], and 2015, but not oriented to the month. Attention is good with 3 of 3 objects on immediate recall, but short-term memory is poor with 0 of 3 objects at 5 minutes. Motor examination shows quadriparesis with the upper extremities being weaker than the lower extremities, and the left side being weaker than the right side. Specific examination shows the deltoid is 2-3 bilaterally. Left biceps and triceps are 1-2, right biceps and triceps are 2-3. Left intrinsics and grip are 0-1. Right intrinsics and grip are 2. Iliopsoas on the left is 3, and 4 minus on the right. Quadriceps is 44+ the left and 4+ to 5 on the right. The dorsiflexor is 4+ in the left and 5 on the right. Plantar flexion is 4 left, and 5 on the right. Sensation is fairly good to pinprick in the right hand, but he has diminished pinprick in the left hand, as well as in the feet bilaterally. Reflexes are 2 in the biceps, brachioradialis, and triceps bilaterally. The left quadriceps is 1-2, in the  right quadriceps is 2. Left gastrocnemius is absent, and the right gastrocnemius is 2. Toes are upgoing bilaterally. Gait and stance are not tested due to his quadriparesis, and the patient was examined in a wheelchair.  Data Review:CBC    Component Value Date/Time   WBC 9.5 08/22/2014 1601   RBC 3.86* 08/22/2014 1601   HGB 12.6* 08/22/2014 1601   HCT 38.6* 08/22/2014 1601   PLT 270 08/22/2014 1601   MCV 100.0 08/22/2014 1601   MCH 32.6 08/22/2014 1601   MCHC 32.6 08/22/2014 1601   RDW 12.8 08/22/2014 1601  BMET    Component Value Date/Time   NA 138 08/22/2014 1601   K 4.5 08/22/2014 1601   CL 101 08/22/2014 1601   CO2 25 08/22/2014 1601   GLUCOSE 156* 08/22/2014 1601   BUN 34* 08/22/2014 1601   CREATININE 1.11 08/22/2014 1601   CALCIUM 9.6 08/22/2014 1601   GFRNONAA 60* 08/22/2014 1601   GFRAA 69* 08/22/2014 1601     Assessment/Plan: Patient with progressive cervical myelopathy with quadriparesis, upper extremities worse and lower Charlie's, left side worse than right side. The patient has a spinal listhesis of C3 on C4 with superimposed disc herniation and spinal cord compression, with multilevel cervical spondylosis and degenerative disc disease as described above. I've spoken with the patient and his family at length about his condition and options for treatment care ranging from comfort care to surgical intervention via C3-4 anterior cervical decompression and arthrodesis with allograft and cervical plating. I've discussed with the patient the nature of his condition, the nature the surgical procedure, the typical length of surgery, hospital stay, and overall recuperation. We discussed limitations postoperatively. I discussed risks of surgery including risks of infection, bleeding, possibly need for transfusion, the risk of nerve root dysfunction with pain, weakness, numbness, or paresthesias, the risk of spinal cord dysfunction with paralysis of all 4 limbs and  quadriplegia, and we discussed the risk of respiratory insufficiency and ventilator dependency and possible need for tracheostomy through the postoperative period, and the risk of dural tear and CSF leakage and possible need for further surgery, the risk of esophageal dysfunction causing dysphagia and the risk of laryngeal dysfunction causing hoarseness of the voice, the risk of failure of the arthrodesis and the possible need for further surgery, and the risk of anesthetic complications including myocardial infarction, stroke, pneumonia, and death. We also discussed the need for postoperative immobilization in a cervical collar. Understanding all this the patient does wish to proceed with surgery and is admitted for such. They understand that his initial postoperative care will be in the intensive care unit to monitor his respiratory function.    Hewitt Shorts, MD 08/22/2014 6:04 PM

## 2014-08-22 NOTE — Op Note (Signed)
08/22/2014  9:19 PM  PATIENT:  Javier Murillo  80 y.o. male  PRE-OPERATIVE DIAGNOSIS:  C3-4 cervical herniated disc with myelopathy, C3-4 spondylolisthesis, cervical spondylosis with myelopathy, cervical degenerative disc disease with myelopathy, cervical stenosis  POST-OPERATIVE DIAGNOSIS:  C3-4 cervical herniated disc with myelopathy, C3-4 spondylolisthesis, cervical spondylosis with myelopathy, cervical degenerative disc disease with myelopathy, cervical stenosis  PROCEDURE:  Procedure(s):  C3-4 anterior cervical decompression and arthrodesis with structural allograft and tether cervical plating  SURGEON:  Surgeon(s): Hewitt Shorts, MD Mariam Dollar, MD  ASSISTANTS: Donalee Citrin, M.D.  ANESTHESIA:   general  EBL:  Total I/O In: 1000 [I.V.:1000] Out: 500 [Urine:500]  BLOOD ADMINISTERED:none  COUNT:  Correct per nursing staff  DICTATION: Patient was brought to the operating room placed under general endotracheal anesthesia. Patient was placed in 10 pounds of halter traction. The neck was prepped with Betadine soap and solution and draped in a sterile fashion. A horizontal incision was made on the left side of the neck. The line of the incision was infiltrated with local anesthetic with epinephrine. Dissection was carried down thru the subcutaneous tissue and platysma, bipolar cautery was used to maintain hemostasis. Dissection was then carried down thru an avascular plane leaving the sternocleidomastoid carotid artery and jugular vein laterally and the trachea and esophagus medially. The ventral aspect of the vertebral column was identified and a localizing x-ray was taken. The C3-4 level was identified. The annulus was incised and the disc space entered. Discectomy was performed with micro-curettes and pituitary rongeurs. The operating microscope was draped and brought into the field provided additional magnification illumination and visualization. Discectomy was continued posteriorly  thru the disc space.  The cartilaginous endplate was removed using micro-curettes along with the high-speed drill. Posterior osteophytic overgrowth was removed using the high-speed drill along with a 2 mm thin footplated Kerrison punch. Posterior longitudinal ligament along with disc herniation was carefully removed, decompressing the spinal canal and thecal sac. We then continued to remove osteophytic overgrowth and disc material decompressing the neural foramina and exiting nerve roots bilaterally. Once the decompression was completed hemostasis was established with the use of Gelfoam with thrombin and bipolar cautery. The Gelfoam was removed the wound irrigated and hemostasis confirmed. We then measured the height of the intravertebral disc space and selected a 9 millimeter in height structural allograft. It was hydrated in saline solution and then gently positioned in the intravertebral disc space and countersunk. Surgifoam was placed in the lateral aspects of the disc space for hemostasis. We then selected a 18 millimeter in height Tether cervical plate. It was positioned over the fusion construct and secured to the vertebra with 4 x 15 mm variable screws at the C3 level, and 4 x 15 mm fixed screws at the C4 level. Each screw hole was started with the high-speed drill and then the screws placed once all the screws were placed final tightening was performed. The wound was irrigated with bacitracin solution checked for hemostasis which was established and confirmed. An x-ray was taken which showed grafts in good position, plate and screws in good position, and the overall alignment to be good. We then proceeded with closure. The platysma was closed with interrupted inverted 2-0 undyed Vicryl suture, the subcutaneous and subcuticular closed with interrupted inverted 3-0 undyed Vicryl suture. The skin edges were approximated with Dermabond. Following surgery the patient was taken out of cervical traction. To be  reversed and the anesthetic and taken to the recovery room for further care.  PLAN OF CARE: Admit to inpatient   PATIENT DISPOSITION:  PACU - hemodynamically stable.   Delay start of Pharmacological VTE agent (>24hrs) due to surgical blood loss or risk of bleeding:  yes

## 2014-08-22 NOTE — Anesthesia Postprocedure Evaluation (Signed)
  Anesthesia Post-op Note  Patient: Javier Murillo  Procedure(s) Performed: Procedure(s) with comments: ANTERIOR CERVICAL DECOMPRESSION/DISCECTOMY FUSION, cervical three-four (N/A) - C3-4 anterior cervical decompression with fusion plating and bonegraft  Patient Location: PACU  Anesthesia Type:General  Level of Consciousness: awake and alert   Airway and Oxygen Therapy: Patient Spontanous Breathing  Post-op Pain: none  Post-op Assessment: Post-op Vital signs reviewed, Patient's Cardiovascular Status Stable and Respiratory Function Stable  Post-op Vital Signs: Reviewed  Filed Vitals:   08/22/14 2215  BP: 160/51  Pulse: 64  Temp:   Resp: 15    Complications: No apparent anesthesia complications

## 2014-08-23 ENCOUNTER — Telehealth: Payer: Self-pay | Admitting: Neurology

## 2014-08-23 ENCOUNTER — Encounter (HOSPITAL_COMMUNITY): Payer: Self-pay | Admitting: Neurosurgery

## 2014-08-23 DIAGNOSIS — M4712 Other spondylosis with myelopathy, cervical region: Secondary | ICD-10-CM

## 2014-08-23 LAB — GLUCOSE, CAPILLARY
Glucose-Capillary: 222 mg/dL — ABNORMAL HIGH (ref 70–99)
Glucose-Capillary: 226 mg/dL — ABNORMAL HIGH (ref 70–99)
Glucose-Capillary: 244 mg/dL — ABNORMAL HIGH (ref 70–99)
Glucose-Capillary: 269 mg/dL — ABNORMAL HIGH (ref 70–99)
Glucose-Capillary: 272 mg/dL — ABNORMAL HIGH (ref 70–99)
Glucose-Capillary: 302 mg/dL — ABNORMAL HIGH (ref 70–99)

## 2014-08-23 LAB — MRSA PCR SCREENING: MRSA by PCR: NEGATIVE

## 2014-08-23 MED ORDER — INSULIN GLARGINE 100 UNIT/ML ~~LOC~~ SOLN
19.0000 [IU] | Freq: Every morning | SUBCUTANEOUS | Status: DC
  Administered 2014-08-23 – 2014-08-28 (×6): 19 [IU] via SUBCUTANEOUS
  Filled 2014-08-23 (×6): qty 0.19

## 2014-08-23 MED ORDER — DEXAMETHASONE SODIUM PHOSPHATE 4 MG/ML IJ SOLN
4.0000 mg | Freq: Two times a day (BID) | INTRAMUSCULAR | Status: AC
  Administered 2014-08-24 – 2014-08-25 (×2): 4 mg via INTRAVENOUS
  Filled 2014-08-23 (×2): qty 1

## 2014-08-23 MED ORDER — DEXAMETHASONE 2 MG PO TABS
2.0000 mg | ORAL_TABLET | Freq: Two times a day (BID) | ORAL | Status: DC
  Administered 2014-08-25 – 2014-08-27 (×4): 2 mg via ORAL
  Filled 2014-08-23 (×5): qty 1

## 2014-08-23 MED ORDER — GLYBURIDE 2.5 MG PO TABS
2.5000 mg | ORAL_TABLET | Freq: Every evening | ORAL | Status: DC
  Administered 2014-08-23 – 2014-08-27 (×5): 2.5 mg via ORAL
  Filled 2014-08-23 (×6): qty 1

## 2014-08-23 MED ORDER — DEXAMETHASONE SODIUM PHOSPHATE 4 MG/ML IJ SOLN
4.0000 mg | Freq: Three times a day (TID) | INTRAMUSCULAR | Status: AC
  Administered 2014-08-24 (×3): 4 mg via INTRAVENOUS
  Filled 2014-08-23 (×4): qty 1

## 2014-08-23 MED ORDER — GLYBURIDE 5 MG PO TABS: 5.0000 mg | ORAL_TABLET | Freq: Two times a day (BID) | ORAL | Status: DC

## 2014-08-23 MED ORDER — GLYBURIDE 5 MG PO TABS
5.0000 mg | ORAL_TABLET | Freq: Two times a day (BID) | ORAL | Status: DC
  Administered 2014-08-24 – 2014-08-28 (×9): 5 mg via ORAL
  Filled 2014-08-23 (×11): qty 1

## 2014-08-23 NOTE — Evaluation (Signed)
Physical Therapy Evaluation Patient Details Name: Javier Murillo MRN: [id number] DOB: 1930-04-19 Today's Date: 08/23/2014   History of Present Illness  Patient is a 80 y.o. male who is admitted for treatment of cervical stenosis with myelopathy with quadriparesis, secondary to a C3-4 cervical disc herniation superimposed upon a C3 on C4 spondylolisthesis. Patient's difficult to begin after he fell 07/14/2014. Since then he's had a steady progressively worsening weakness in the upper extremities worse than in the lower extremities. resulting severe stenosis at the C3-4 level. Patient now s/p ANTERIOR CERVICAL DECOMPRESSION/DISCECTOMY FUSION.   Clinical Impression  Patient demonstrates deficits in functional mobility as indicated below. Will benefit from continued skilled Pt to address deficits and maximize recovery of function. Patient was independent prior to fall and onset of symptoms, recommend CIR consult. Will see as indicated and progress as tolerated.     Follow Up Recommendations CIR    Equipment Recommendations  Other (comment) (TBD)    Recommendations for Other Services Rehab consult     Precautions / Restrictions Precautions Precautions: Cervical;Fall Required Braces or Orthoses: Cervical Brace Cervical Brace: Soft collar      Mobility  Bed Mobility               General bed mobility comments: pt in chair  Transfers Overall transfer level: Needs assistance Equipment used: Rolling walker (2 wheeled) Transfers: Sit to/from Stand Sit to Stand: +2 physical assistance;Max assist         General transfer comment: pt with posterior lean and knees in hyper extension in standing  Ambulation/Gait             General Gait Details: unable to perform at this time, attempted with LE buckling  Stairs            Wheelchair Mobility    Modified Rankin (Stroke Patients Only)       Balance Overall balance assessment: Needs assistance Sitting-balance  support: Feet supported Sitting balance-Leahy Scale: Fair     Standing balance support: Bilateral upper extremity supported;During functional activity Standing balance-Leahy Scale: Zero Standing balance comment: unable to maintain grip on RW secondary to weakness and decreased sensation complicated ability to maintain balance.                             Pertinent Vitals/Pain Pain Assessment: No/denies pain    Home Living Family/patient expects to be discharged to:: Inpatient rehab Living Arrangements: Alone                    Prior Function Level of Independence: Needs assistance   Gait / Transfers Assistance Needed: prior to onset of symptoms patient was independent with mobility  ADL's / Homemaking Assistance Needed: needed increased         Hand Dominance   Dominant Hand: Right    Extremity/Trunk Assessment   Upper Extremity Assessment: RUE deficits/detail RUE Deficits / Details: decreased active extension, as well as wrist extension, as well as grasp   RUE Sensation: decreased light touch LUE Deficits / Details: LUE is worse than RUE.    Lower Extremity Assessment: Generalized weakness;RLE deficits/detail;LLE deficits/detail RLE Deficits / Details: 3+/5 gross movements with more weakness noted left compared to right esp dorsiflexion LLE Deficits / Details: 3+ to 4-/5 gross movements with more weakness noted left compared to right esp dorsiflexion     Communication   Communication: No difficulties  Cognition Arousal/Alertness: Awake/alert Behavior During Therapy: [facility] for  tasks assessed/performed Overall Cognitive Status: Impaired/Different from baseline                      General Comments      Exercises        Assessment/Plan    PT Assessment Patient needs continued PT services  PT Diagnosis Difficulty walking;Abnormality of gait;Generalized weakness   PT Problem List Decreased strength;Decreased range of  motion;Decreased activity tolerance;Decreased balance;Decreased mobility;Impaired sensation  PT Treatment Interventions DME instruction;Gait training;Stair training;Functional mobility training;Therapeutic activities;Therapeutic exercise;Balance training;Patient/family education   PT Goals (Current goals can be found in the Care Plan section) Acute Rehab PT Goals Patient Stated Goal: get back home PT Goal Formulation: With patient Time For Goal Achievement: 09/06/14 Potential to Achieve Goals: Fair    Frequency Min 3X/week   Barriers to discharge        Co-evaluation               End of Session Equipment Utilized During Treatment: Gait belt;Cervical collar Activity Tolerance: Patient tolerated treatment well Patient left: in chair;with call bell/phone within reach Nurse Communication: Mobility status         Time: 1241-1310 PT Time Calculation (min): 29 min   Charges:   PT Evaluation $Initial PT Evaluation Tier I: 1 Procedure PT Treatments $Therapeutic Activity: 8-22 mins   PT G CodesFabio Asa 08/23/2014, 3:20 PM  Charlotte Crumb, PT DPT  [phone]

## 2014-08-23 NOTE — Telephone Encounter (Signed)
I called patient, talked with the daughter. The blood work that was done was relatively unremarkable with exception of a slight elevation in the angiotensin-converting enzyme level. The patient has already had surgery on the cervical spine, currently in the hospital.

## 2014-08-23 NOTE — Progress Notes (Addendum)
Subjective: Patient comfortable, resting in bed. Blood sugars 226-272. Not wearing soft cervical collar, placed on patient by me again.  Objective: Vital signs in last 24 hours: Filed Vitals:   08/23/14 0500 08/23/14 0600 08/23/14 0700 08/23/14 0800  BP: 141/59 162/82 129/50   Pulse: 65 81 66   Temp:    98 F (36.7 C)  TempSrc:    Oral  Resp: Height:      Weight:      SpO2: 96% 97% 95%     Intake/Output from previous day: 09/16 0701 - 09/17 0700 In: 1750 [I.V.:1750] Out: 960 [Urine:960]    Physical Exam:  Wound clean and dry. Motor examination shows significant improvement in quadriparesis. Deltoid, biceps, and triceps are 4 bilaterally, somewhat more intense on the right than the left. Intrinsic and grip are 2 on the left and 2-3 on the right. Left iliopsoas is 4, right iliopsoas is 4+ to 5. Quadriceps, dorsi flexion, plantar flexor are 5 bilaterally.  Assessment/Plan: Patient with significant improvement following C3-4 ACDF last night. Continues on Decadron 4 mg IV every 6 hours, we'll plan on tapering over the next several days. Awaiting PT and OT consultation. Awaiting PM and R. consultation regarding comprehensive inpatient rehabilitation.  We'll begin out of bed to chair today, and resume carb modified diet. We'll need to resume DiaBeta and Lantus, once taking adequately by mouth. Dr. Jeral Fruit to cover until Monday 9/21.  Hewitt Shorts, MD 08/23/2014, 8:59 AM

## 2014-08-23 NOTE — Progress Notes (Signed)
Utilization review completed.  

## 2014-08-23 NOTE — Evaluation (Signed)
Occupational Therapy Evaluation Patient Details Name: Javier Murillo MRN: [id number] DOB: 09/06/1930 Today's Date: 08/23/2014    History of Present Illness Patient is a 80 y.o. male who is admitted for treatment of cervical stenosis with myelopathy with quadriparesis, secondary to a C3-4 cervical disc herniation superimposed upon a C3 on C4 spondylolisthesis. Patient's difficult to begin after he fell 07/14/2014. Since then he's had a steady progressively worsening weakness in the upper extremities worse than in the lower extremities. He went from ambulating with a single-point cane to requiring a pair single-point cane to 2 requiring a walker to requiring a wheelchair. He can no longer feed himself because the weakness in the upper extremities, he requires assistance with all ADLs including bathing, dressing, and toileting. MRI done this week revealed multilevel spondylosis and degenerative disease was following disc protrusions at all levels, but with a C3 on C4 spondylolisthesis, a C3-4 cervical disc herniation, and resulting severe stenosis at the C3-4 level.    Clinical Impression   Pt presents to OT with decreased I with all ADL activity due to decreased use of both hands, decreased balance and problems listed below. Pt will benefit from skilled OT to increase I with ADL activity and return to PLOF    Follow Up Recommendations  CIR    Equipment Recommendations  None recommended by OT    Recommendations for Other Services       Precautions / Restrictions Precautions Precautions: Cervical;Fall      Mobility Bed Mobility               General bed mobility comments: pt in chair  Transfers Overall transfer level: Needs assistance Equipment used: Rolling walker (2 wheeled) Transfers: Sit to/from Stand Sit to Stand: +2 physical assistance;Max assist         General transfer comment: pt with posterior lean and knees in hyper extension in standing    Balance                                             ADL Overall ADL's : Independent Eating/Feeding: Maximal assistance;Sitting   Grooming: Sitting;Maximal assistance                   Toilet Transfer: +2 for physical assistance;Maximal assistance;+2 for safety/equipment Toilet Transfer Details (indicate cue type and reason): sit to stand only           General ADL Comments: Pts Bilateral hands tend to have fingers in flexion and be edematous. Educated RN to keep propped on pillows with fingers in extension.  Pt may need splints for bilateral hands               Pertinent Vitals/Pain Pain Assessment: No/denies pain     Hand Dominance Right   Extremity/Trunk Assessment Upper Extremity Assessment Upper Extremity Assessment: RUE deficits/detail;LUE deficits/detail RUE Deficits / Details: decreased active extension, as well as wrist extension, as well as grasp RUE Sensation: decreased light touch RUE Coordination: decreased fine motor LUE Deficits / Details: LUE is worse than RUE.  LUE Sensation: decreased light touch LUE Coordination: decreased fine motor           Communication Communication Communication: No difficulties   Cognition Arousal/Alertness: Awake/alert Behavior During Therapy: WFL for tasks assessed/performed Overall Cognitive Status: Impaired/Different from baseline  General Comments    Pt very limited by BUE (decreased use of both hands).  Pt with posterior lean in standing which is also very limiting. Pt will benefit from skilled OT to increase I with ADL activity and return to [facility]           Home Living Family/patient expects to be discharged to:: Inpatient rehab (per nurse)                                        Prior Functioning/Environment Level of Independence: Needs assistance    ADL's / Homemaking Assistance Needed: needed increased         OT Diagnosis: Generalized weakness    OT Problem List: Decreased strength;Decreased range of motion;Impaired balance (sitting and/or standing)   OT Treatment/Interventions: Self-care/ADL training;Patient/family education;DME and/or AE instruction;Therapeutic exercise    OT Goals(Current goals can be found in the care plan section) Acute Rehab OT Goals Patient Stated Goal: get back home OT Goal Formulation: With patient Time For Goal Achievement: 09/06/14  OT Frequency: Min 2X/week              End of Session Equipment Utilized During Treatment: Engineer, water Communication: Mobility status  Activity Tolerance: Patient tolerated treatment well Patient left: in chair;with call bell/phone within reach   Time: 1241-1310 OT Time Calculation (min): 29 min Charges:  OT General Charges $OT Visit: 1 Procedure OT Evaluation $Initial OT Evaluation Tier I: 1 Procedure OT Treatments $Self Care/Home Management : 8-22 mins G-Codes:    Einar Crow D 08/31/2014, 2:04 PM

## 2014-08-23 NOTE — Transfer of Care (Signed)
Immediate Anesthesia Transfer of Care Note  Patient: Javier Murillo  Procedure(s) Performed: Procedure(s) with comments: ANTERIOR CERVICAL DECOMPRESSION/DISCECTOMY FUSION, cervical three-four (N/A) - C3-4 anterior cervical decompression with fusion plating and bonegraft  Patient Location: PACU  Anesthesia Type:General  Level of Consciousness: awake, patient cooperative and responds to stimulation  Airway & Oxygen Therapy: Patient Spontanous Breathing  Post-op Assessment: Report given to PACU RN and Post -op Vital signs reviewed and stable  Post vital signs: Reviewed and stable  Complications: No apparent anesthesia complications

## 2014-08-23 NOTE — Consult Note (Signed)
Physical Medicine and Rehabilitation Consult Reason for Consult: Cervical stenosis with myelopathy Referring Physician: Dr. Newell Coral   HPI: Javier Murillo is a 80 y.o. right-handed male with history of hypertension and diabetes mellitus with peripheral neuropathy as well as multiple back surgeries. Presented 08/22/2014 with recent fall 07/14/2014 and progressive weakness in the upper extremities worse in the lower extremities. He was ambulating with a single-point cane and later need of a walker and wheelchair. MRI of cervical spine revealed multilevel spondylosis and degenerative disease with disc protrusions at all levels but with C3 and C4 spondylolisthesis and disc herniation resulting in severe stenosis with myelopathy. Underwent C3-4 anterior cervical decompression and arthrodesis 08/22/2014 per Dr. Newell Coral. Postoperative pain management. Maintained on Decadron protocol. Physical and occupational therapy evaluations are pending. M.D. has requested physical medicine rehabilitation consult   Review of Systems  Gastrointestinal: Positive for constipation.  Musculoskeletal: Positive for falls, myalgias and neck pain.  Neurological: Positive for weakness.  Psychiatric/Behavioral: Positive for depression.   Past Medical History  Diagnosis Date  . Kidney stones     only once  . Hypertension   . Hyperlipidemia   . Arthritis   . Diabetes mellitus without complication    Past Surgical History  Procedure Laterality Date  . Back surgery      x 7  . Eye surgery      bilateral cataracts  . Bone spur removed  right sholder    . Appendectomy     History reviewed. No pertinent family history. Social History:  reports that he has quit smoking. He does not have any smokeless tobacco history on file. He reports that he does not drink alcohol or use illicit drugs. Allergies:  Allergies  Allergen Reactions  . Fluvirin [Influenza Virus Vaccine Split]     Gave patient flu    Medications Prior to Admission  Medication Sig Dispense Refill  . acetaminophen (TYLENOL) 325 MG tablet Take 650 mg by mouth 2 (two) times daily.      Marland Kitchen aspirin EC 81 MG tablet Take 81 mg by mouth every morning.      Marland Kitchen atenolol (TENORMIN) 25 MG tablet Take 12.5 mg by mouth every morning.      . cholecalciferol (VITAMIN D) 1000 UNITS tablet Take 1,000 Units by mouth every morning.      . ferrous sulfate 325 (65 FE) MG tablet Take 325 mg by mouth daily with breakfast.      . FLUoxetine (PROZAC) 20 MG capsule Take 20 mg by mouth every morning.      . fluticasone (FLONASE) 50 MCG/ACT nasal spray Place 2 sprays into both nostrils daily.      Marland Kitchen gabapentin (NEURONTIN) 100 MG capsule Take 100-200 mg by mouth 2 (two) times daily. *takes  in the morning and  in the evening*      . glyBURIDE (DIABETA) 2.5 MG tablet Take 2.5 mg by mouth every evening.      . glyBURIDE (DIABETA) 5 MG tablet Take 5 mg by mouth 2 (two) times daily with a meal. *takes at breakfast and lunch*      . insulin aspart (NOVOLOG) 100 UNIT/ML injection Inject 0-8 Units into the skin 3 (three) times daily before meals. <100=0 units, 100-150=2 units, 151-200=4 units, 201-250=6 units, >250= 8 units      . insulin glargine (LANTUS) 100 UNIT/ML injection Inject 19 Units into the skin every morning.      . meclizine (ANTIVERT) 12.5 MG tablet Take 12.5  mg by mouth 2 (two) times daily as needed for dizziness.      . Multiple Vitamins-Minerals (MULTIVITAMIN PO) Take 1 tablet by mouth daily.      . vitamin C (ASCORBIC ACID) 500 MG tablet Take 500 mg by mouth 2 (two) times daily.      . vitamin E 400 UNIT capsule Take 400 Units by mouth daily.      Marland Kitchen zinc gluconate 50 MG tablet Take 50 mg by mouth 2 (two) times daily.        Home: Home Living Living Arrangements: Alone  Functional History:   Functional Status:  Mobility:          ADL:    Cognition: Cognition Orientation Level: Oriented to person;Oriented to  situation;Oriented to time    Blood pressure 144/67, pulse 82, temperature 97.6 F (36.4 C), temperature source Oral, resp. rate 11, height  (1.753 m), weight 71 kg (156 lb 8.4 oz), SpO2 96.00%. Physical Exam  Vitals reviewed. Constitutional: He appears well-developed.  HENT:  Head: Normocephalic.  Eyes: EOM are normal.  Neck: Neck supple. No thyromegaly present.  Cardiovascular: Normal rate and regular rhythm.   Respiratory: Effort normal and breath sounds normal. No respiratory distress.  GI: Soft. Bowel sounds are normal. He exhibits no distension.  Neurological: He is alert.  His voice is a bit hoarse but intelligible. He provides his name and age as well as address. Follows simple commands. Sensory loss to PP/LT in hands. MMT: deltoids 4/5. Bicep/triceps 4/5. Wrist 4-, HI 1+ to 2. LE: HF 3+, KE 4-, ADF/APF 4/5. No resting tone. DTR's 1+. Delayed responses at time.   Skin:  Surgical neck incision clean and dry  Psychiatric: He has a normal mood and affect. His behavior is normal.    Results for orders placed during the hospital encounter of 08/22/14 (from the past 24 hour(s))  GLUCOSE, CAPILLARY     Status: Abnormal   Collection Time    08/22/14  3:35 PM      Result Value Ref Range   Glucose-Capillary 161 (*) 70 - 99 mg/dL  CBC     Status: Abnormal   Collection Time    08/22/14  4:01 PM      Result Value Ref Range   WBC 9.5  4.0 - 10.5 K/uL   RBC 3.86 (*) 4.22 - 5.81 MIL/uL   Hemoglobin 12.6 (*) 13.0 - 17.0 g/dL   HCT 16.1 (*) 09.6 - 04.5 %   MCV 100.0  78.0 - 100.0 fL   MCH 32.6  26.0 - 34.0 pg   MCHC 32.6  30.0 - 36.0 g/dL   RDW 40.9  81.1 - 91.4 %   Platelets 270  150 - 400 K/uL  COMPREHENSIVE METABOLIC PANEL     Status: Abnormal   Collection Time    08/22/14  4:01 PM      Result Value Ref Range   Sodium 138  137 - 147 mEq/L   Potassium 4.5  3.7 - 5.3 mEq/L   Chloride 101  96 - 112 mEq/L   CO2 25  19 - 32 mEq/L   Glucose, Bld 156 (*) 70 - 99 mg/dL   BUN  34 (*) 6 - 23 mg/dL   Creatinine, Ser 7.82  0.50 - 1.35 mg/dL   Calcium 9.6  8.4 - 95.6 mg/dL   Total Protein 6.4  6.0 - 8.3 g/dL   Albumin 3.6  3.5 - 5.2 g/dL   AST 26  0 -  37 U/L   ALT 33  0 - 53 U/L   Alkaline Phosphatase 78  39 - 117 U/L   Total Bilirubin 0.2 (*) 0.3 - 1.2 mg/dL   GFR calc non Af Amer 60 (*) >90 mL/min   GFR calc Af Amer 69 (*) >90 mL/min   Anion gap 12  5 - 15  GLUCOSE, CAPILLARY     Status: Abnormal   Collection Time    08/22/14  5:50 PM      Result Value Ref Range   Glucose-Capillary 114 (*) 70 - 99 mg/dL  GLUCOSE, CAPILLARY     Status: Abnormal   Collection Time    08/22/14  9:53 PM      Result Value Ref Range   Glucose-Capillary 167 (*) 70 - 99 mg/dL  GLUCOSE, CAPILLARY     Status: Abnormal   Collection Time    08/23/14 12:17 AM      Result Value Ref Range   Glucose-Capillary 222 (*) 70 - 99 mg/dL   Comment 1 Notify RN    MRSA PCR SCREENING     Status: None   Collection Time    08/23/14 12:43 AM      Result Value Ref Range   MRSA by PCR NEGATIVE  NEGATIVE  GLUCOSE, CAPILLARY     Status: Abnormal   Collection Time    08/23/14  4:05 AM      Result Value Ref Range   Glucose-Capillary 226 (*) 70 - 99 mg/dL   Dg Cervical Spine 2-3 Views  08/22/2014   CLINICAL DATA:  Cervical disc disease.  EXAM: CERVICAL SPINE - 2-3 VIEW  COMPARISON:  None.  FINDINGS: Radiograph 1 demonstrates a needle at the C3-4 level. Radiograph 2 demonstrates the patient has undergone anterior cervical fusion at C3-4. Alignment is now near anatomic. Anterior plate and screws and interbody plug are in place.  IMPRESSION: Anterior fusion performed at C3-4.  Improved alignment.   Electronically Signed   By: Geanie Cooley M.D.   On: 08/22/2014 21:20    Assessment/Plan: Diagnosis: C3-4 Myelopathy s./p decompression/fusion. Pt with a central cord presentation 1. Does the need for close, 24 hr/day medical supervision in concert with the patient's rehab needs make it unreasonable for this  patient to be served in a less intensive setting? Yes 2. Co-Morbidities requiring supervision/potential complications: pain, htn, dm 3. Due to bladder management, bowel management, safety, skin/wound care, disease management, medication administration, pain management and patient education, does the patient require 24 hr/day rehab nursing? Yes 4. Does the patient require coordinated care of a physician, rehab nurse, PT (1-2 hrs/day, 5 days/week) and OT (1-2 hrs/day, 5 days/week) to address physical and functional deficits in the context of the above medical diagnosis(es)? Yes Addressing deficits in the following areas: balance, endurance, locomotion, strength, transferring, bowel/bladder control, bathing, dressing, feeding, grooming, toileting and psychosocial support 5. Can the patient actively participate in an intensive therapy program of at least 3 hrs of therapy per day at least 5 days per week? Yes 6. The potential for patient to make measurable gains while on inpatient rehab is excellent 7. Anticipated functional outcomes upon discharge from inpatient rehab are modified independent  with PT, modified independent and supervision with OT, n/a with SLP. 8. Estimated rehab length of stay to reach the above functional goals is: likely 10-15 days 9. Does the patient have adequate social supports to accommodate these discharge functional goals? Yes 10. Anticipated D/C setting: Home 11. Anticipated post D/C treatments: HH  therapy and Outpatient therapy 12. Overall Rehab/Functional Prognosis: excellent  RECOMMENDATIONS: This patient's condition is appropriate for continued rehabilitative care in the following setting: CIR Patient has agreed to participate in recommended program. Yes Note that insurance prior authorization may be required for reimbursement for recommended care.  Comment: Rehab Admissions Coordinator to follow up.  Thanks,  Ranelle Oyster, MD, [place]     08/23/2014

## 2014-08-24 LAB — GLUCOSE, CAPILLARY
Glucose-Capillary: 185 mg/dL — ABNORMAL HIGH (ref 70–99)
Glucose-Capillary: 190 mg/dL — ABNORMAL HIGH (ref 70–99)
Glucose-Capillary: 207 mg/dL — ABNORMAL HIGH (ref 70–99)
Glucose-Capillary: 208 mg/dL — ABNORMAL HIGH (ref 70–99)
Glucose-Capillary: 220 mg/dL — ABNORMAL HIGH (ref 70–99)
Glucose-Capillary: 242 mg/dL — ABNORMAL HIGH (ref 70–99)
Glucose-Capillary: 291 mg/dL — ABNORMAL HIGH (ref 70–99)

## 2014-08-24 MED ORDER — HYDRALAZINE HCL 20 MG/ML IJ SOLN: 5.0000 mg | INTRAMUSCULAR | Status: DC | PRN

## 2014-08-24 MED ORDER — PANTOPRAZOLE SODIUM 40 MG PO TBEC
40.0000 mg | DELAYED_RELEASE_TABLET | Freq: Every day | ORAL | Status: DC
  Administered 2014-08-24 – 2014-08-28 (×5): 40 mg via ORAL
  Filled 2014-08-24 (×5): qty 1

## 2014-08-24 NOTE — Progress Notes (Signed)
Physical Therapy Treatment Patient Details Name: Javier Murillo MRN: [id number] DOB: [birth year] Today's Date: 08/24/2014    History of Present Illness Patient is a 80 y.o. male who is admitted for treatment of cervical stenosis with myelopathy with quadriparesis, secondary to a C3-4 cervical disc herniation superimposed upon a C3 on C4 spondylolisthesis. Patient's difficult to begin after he fell 07/14/2014. Since then he's had a steady progressively worsening weakness in the upper extremities worse than in the lower extremities. He went from ambulating with a single-point cane to requiring a pair single-point cane to 2 requiring a walker to requiring a wheelchair. He can no longer feed himself because the weakness in the upper extremities, he requires assistance with all ADLs including bathing, dressing, and toileting. MRI done this week revealed multilevel spondylosis and degenerative disease was following disc protrusions at all levels, but with a C3 on C4 spondylolisthesis, a C3-4 cervical disc herniation, and resulting severe stenosis at the C3-4 level.     PT Comments    Patient tolerated OOB to chair activity in addition to trunk control and extremity exercises. Patient still requires significant assist for all activities in standing (+2 max).  Will continue to see and progress as tolerated. OF NOTE: BP elevated at start of session 180s systolic 170s/110s EOB, but dropped to 130s systolic when upright in chair. Nsg aware.   Follow Up Recommendations  CIR     Equipment Recommendations  Other (comment) (TBD)    Recommendations for Other Services Rehab consult     Precautions / Restrictions Precautions Precautions: Cervical;Fall Required Braces or Orthoses: Cervical Brace Cervical Brace: Soft collar    Mobility  Bed Mobility Overal bed mobility: Needs Assistance Bed Mobility: Rolling;Sidelying to Sit Rolling: Mod assist Sidelying to sit: Mod assist       General bed  mobility comments: Max VCs for positioning and hand placement, assist to roll and power up, assist for rotation of trunk to EOB  Transfers Overall transfer level: Needs assistance Equipment used: Rolling walker (2 wheeled) Transfers: Sit to/from [employer] Transfers Sit to Stand: +2 physical assistance;Max assist Stand pivot transfers: Max assist;+2 physical assistance       General transfer comment: pt with posterior lean and knees in hyper extension in standing, VCs for placement of step, patient unable to take weight off and unload LEs  Ambulation/Gait             General Gait Details: attempted again, patient still unable to off load LEs without buckling   Stairs            Wheelchair Mobility    Modified Rankin (Stroke Patients Only)       Balance     Sitting balance-Leahy Scale: Fair       Standing balance-Leahy Scale: Zero                      Cognition Arousal/Alertness: Awake/alert Behavior During Therapy: WFL for tasks assessed/performed Overall Cognitive Status: Impaired/Different from baseline Area of Impairment: Attention;Following commands;Problem solving   Current Attention Level: Focused Memory: Decreased short-term memory Following Commands: Follows one step commands with increased time     Problem Solving: Slow processing;Decreased initiation;Difficulty sequencing;Requires verbal cues;Requires tactile cues General Comments: Patient appears more confused during this session compared to previous session    Exercises Other Exercises Other Exercises: EOB trunk control activities perform, leaning weight shifts and UE reaching Other Exercises: Standing weight shifts performed with maximial assist ( attempted to unload  for single leg stnace but unable to perform)    General Comments        Pertinent Vitals/Pain Pain Assessment: No/denies pain    Home Living Family/patient expects to be discharged to:: Inpatient  rehab Living Arrangements: Alone                  Prior Function Level of Independence: Needs assistance  Gait / Transfers Assistance Needed: prior to onset of symptoms patient was independent with mobility ADL's / Homemaking Assistance Needed: needed increased      PT Goals (current goals can now be found in the care plan section) Acute Rehab PT Goals Patient Stated Goal: get back home PT Goal Formulation: With patient Time For Goal Achievement: 09/06/14 Potential to Achieve Goals: Fair Progress towards PT goals: Progressing toward goals    Frequency  Min 3X/week    PT Plan Current plan remains appropriate    Co-evaluation             End of Session Equipment Utilized During Treatment: Gait belt;Cervical collar Activity Tolerance: Patient tolerated treatment well Patient left: in chair;with call bell/phone within reach;with chair alarm set     Time: 9604-5409 PT Time Calculation (min): 24 min  Charges:  $Therapeutic Activity: 23-37 mins                    G CodesFabio Asa 08/29/2014, 11:30 AM Charlotte Crumb, PT DPT  [phone]

## 2014-08-24 NOTE — Progress Notes (Signed)
Patient ID: Javier Murillo, male   DOB: 10/09/1930, 80 y.o.   MRN: [id number] Neuro unchanged. Awake. Dense quadriparetic. Can not feed himself.seen by rehabilitation. ?placement

## 2014-08-24 NOTE — Progress Notes (Signed)
Rehab admissions - Evaluated for possible admission.  I spoke with daughter, [name].  She prefers inpatient rehab admission first and then expects patient to go to [facility] in [place] after inpatient rehab stay.  Currently patient is not doing enough with therapy for me to be able to submit to [facility] for approval.  I would like to follow progress over the weekend and then see how patient is doing on Monday.  I will hold off on submitting clinicals until I can see some functional gains.  Call me for questions.  #[phone]

## 2014-08-24 NOTE — Progress Notes (Signed)
Occupational Therapy Treatment Patient Details Name: Javier Murillo MRN: [id number] DOB: December 01, 1930 Today's Date: 08/24/2014    History of present illness Patient is a 80 y.o. male who is admitted for treatment of cervical stenosis with myelopathy with quadriparesis, secondary to a C3-4 cervical disc herniation superimposed upon a C3 on C4 spondylolisthesis. Patient's difficult to begin after he fell 07/14/2014. Since then he's had a steady progressively worsening weakness in the upper extremities worse than in the lower extremities. He went from ambulating with a single-point cane to requiring a pair single-point cane to 2 requiring a walker to requiring a wheelchair. He can no longer feed himself because the weakness in the upper extremities, he requires assistance with all ADLs including bathing, dressing, and toileting. MRI done this week revealed multilevel spondylosis and degenerative disease was following disc protrusions at all levels, but with a C3 on C4 spondylolisthesis, a C3-4 cervical disc herniation, and resulting severe stenosis at the C3-4 level.    OT comments  Focus on self feeding and BUE A/AAROM and strengthening. Apparent flexor spasticity present. Will assess for use of B resting hand splints on Monday. Assessed with AE for self feeding. REquired cues for pt to attend to activity. Pt with apparent confusion this pm. Nsg aware. Will continue to follow.  Follow Up Recommendations  CIR    Equipment Recommendations  None recommended by OT    Recommendations for Other Services Rehab consult    Precautions / Restrictions Precautions Precautions: Cervical;Fall Required Braces or Orthoses: Cervical Brace Cervical Brace: Soft collar       Mobility Bed Mobility                  Transfers                      Balance     Sitting balance-Leahy Scale: Poor (pt appears unawaren of leaning to the L in the recliner)                              ADL Overall ADL's : Needs assistance/impaired Eating/Feeding: Maximal assistance;Sitting Eating/Feeding Details (indicate cue type and reason): assessed wtih use of theratubing. Pt unable to manipulate into bowl. May benefit from use of swivel spoon with other AE. will assess with wide handle sippy cup.                                   General ADL Comments: Keeps hands flexed at rest. able to extend on command after delay. flexor spasticity present      Vision                     Perception     Praxis      Cognition   Behavior During Therapy: [facility] for tasks assessed/performed Overall Cognitive Status: Impaired/Different from baseline Area of Impairment: Attention;Memory;Following commands;Safety/judgement;Awareness;Problem solving;Orientation Orientation Level: Disoriented to;Time Current Attention Level: Sustained Memory: Decreased recall of precautions;Decreased short-term memory  Following Commands: Follows one step commands with increased time Safety/Judgement: Decreased awareness of safety;Decreased awareness of deficits Awareness: Intellectual Problem Solving: Slow processing;Decreased initiation;Difficulty sequencing;Requires verbal cues General Comments: Confused at times. Thinking he is in his daugher's house, then back to the hospital. Talking about being in the cafeteria this am. ?ICU delirium    Extremity/Trunk Assessment  Exercises Other Exercises Other Exercises: BUE A/AAROM and B grip composite extension exercises Other Exercises: hand to mouth strengtheing - difficulty maintaining attention to complete ADL task   Shoulder Instructions       General Comments      Pertinent Vitals/ Pain       Pain Assessment: No/denies pain  Home Living                                          Prior Functioning/Environment              Frequency Min 2X/week     Progress Toward Goals  OT  Goals(current goals can now be found in the care plan section)  Progress towards OT goals: Progressing toward goals  Acute Rehab OT Goals Patient Stated Goal: get back home OT Goal Formulation: With patient Time For Goal Achievement: 09/06/14 ADL Goals Pt Will Perform Eating: with min assist;with adaptive utensils Pt Will Perform Grooming: with min assist;with adaptive equipment;sitting Pt Will Transfer to Toilet: with mod assist;bedside commode Pt Will Perform Toileting - Clothing Manipulation and hygiene: with min assist;sit to/from stand Pt/caregiver will Perform Home Exercise Program: Increased strength;Increased ROM;Both right and left upper extremity;With theraputty;With written HEP provided;Left upper extremity;Right Upper extremity  Plan Discharge plan remains appropriate    Co-evaluation                 End of Session Equipment Utilized During Treatment: Cervical collar   Activity Tolerance Patient tolerated treatment well   Patient Left in chair;with call bell/phone within reach   Nurse Communication Mobility status;Other (comment) (confusion)        Time: 1610-9604 OT Time Calculation (min): 31 min  Charges: OT General Charges $OT Visit: 1 Procedure OT Treatments $Self Care/Home Management : 8-22 mins $Therapeutic Activity: 8-22 mins  Hoy Fallert,HILLARY 08/24/2014, 3:22 PM   [facility], OTR/L  [phone] 08/24/2014

## 2014-08-25 LAB — GLUCOSE, CAPILLARY
Glucose-Capillary: 156 mg/dL — ABNORMAL HIGH (ref 70–99)
Glucose-Capillary: 154 mg/dL — ABNORMAL HIGH (ref 70–99)
Glucose-Capillary: 167 mg/dL — ABNORMAL HIGH (ref 70–99)
Glucose-Capillary: 242 mg/dL — ABNORMAL HIGH (ref 70–99)
Glucose-Capillary: 180 mg/dL — ABNORMAL HIGH (ref 70–99)

## 2014-08-25 NOTE — Progress Notes (Signed)
Patient ID: Javier Murillo, male   DOB: 1930/05/05, 80 y.o.   MRN: [id number] Awake, no complains. Was able to feed himself.wound dry

## 2014-08-26 LAB — GLUCOSE, CAPILLARY
Glucose-Capillary: 126 mg/dL — ABNORMAL HIGH (ref 70–99)
Glucose-Capillary: 131 mg/dL — ABNORMAL HIGH (ref 70–99)
Glucose-Capillary: 243 mg/dL — ABNORMAL HIGH (ref 70–99)
Glucose-Capillary: 322 mg/dL — ABNORMAL HIGH (ref 70–99)
Glucose-Capillary: 333 mg/dL — ABNORMAL HIGH (ref 70–99)
Glucose-Capillary: 88 mg/dL (ref 70–99)
Glucose-Capillary: 90 mg/dL (ref 70–99)

## 2014-08-26 NOTE — Progress Notes (Signed)
Patient ID: Javier Murillo, male   DOB: 1930/09/03, 80 y.o.   MRN: [id number] Stable, wound dry. Rehabilitation medicine to evaluate

## 2014-08-27 LAB — GLUCOSE, CAPILLARY
Glucose-Capillary: 112 mg/dL — ABNORMAL HIGH (ref 70–99)
Glucose-Capillary: 127 mg/dL — ABNORMAL HIGH (ref 70–99)
Glucose-Capillary: 135 mg/dL — ABNORMAL HIGH (ref 70–99)
Glucose-Capillary: 179 mg/dL — ABNORMAL HIGH (ref 70–99)
Glucose-Capillary: 315 mg/dL — ABNORMAL HIGH (ref 70–99)
Glucose-Capillary: 394 mg/dL — ABNORMAL HIGH (ref 70–99)

## 2014-08-27 MED ORDER — DEXAMETHASONE 0.75 MG PO TABS: 0.7500 mg | ORAL_TABLET | ORAL | Status: DC

## 2014-08-27 MED ORDER — DEXAMETHASONE 0.75 MG PO TABS
0.7500 mg | ORAL_TABLET | Freq: Every day | ORAL | Status: DC
  Administered 2014-08-28: 0.75 mg via ORAL
  Filled 2014-08-27: qty 1

## 2014-08-27 NOTE — Progress Notes (Signed)
I met with pt and his son in law at bedside. I discussed Dr. Lollie Sails and Dr. Charm Barges recommendation for an inpt rehab admission rather than return to [facility]. Pt to the point of argumentative about CIR vs [facility]. He prefers [facility]. Son in Sports coach and dtr to discuss more with pt for they prefer CIR admission. I will begin authorization with [facility] for a possible admission to inpt rehab if pt will agree. I will follow up tomorrow. [phone]

## 2014-08-27 NOTE — Progress Notes (Signed)
Occupational Therapy Treatment Patient Details Name: Javier Murillo MRN: [id number] DOB: [birth year] Today's Date: 08/27/2014    History of present illness Patient is a 80 y.o. male who is admitted for treatment of cervical stenosis with myelopathy with quadriparesis, secondary to a C3-4 cervical disc herniation superimposed upon a C3 on C4 spondylolisthesis. Patient's difficult to begin after he fell 07/14/2014. Since then he's had a steady progressively worsening weakness in the upper extremities worse than in the lower extremities. He went from ambulating with a single-point cane to requiring a pair single-point cane to 2 requiring a walker to requiring a wheelchair. He can no longer feed himself because the weakness in the upper extremities, he requires assistance with all ADLs including bathing, dressing, and toileting. MRI done this week revealed multilevel spondylosis and degenerative disease was following disc protrusions at all levels, but with a C3 on C4 spondylolisthesis, a C3-4 cervical disc herniation, and resulting severe stenosis at the C3-4 level.    OT comments  Pt making significant progress. Demonstrates increased strength BUE - greater proximally. RUE stronger than L. Using R hand functionally to assist with feeding. Pt issued additional AE to increase ability to feed self. Educated family and nursing on proper set up. Pt not at baseline cognition.  Excellent CIR candidate. Pt will most likely need 24/7 care initially after D/C from CIR. Unsure what level of assist family able to provide. Will continue to follow acutely to address established goals.  Follow Up Recommendations  CIR    Equipment Recommendations  None recommended by OT    Recommendations for Other Services Rehab consult    Precautions / Restrictions Precautions Precautions: Cervical;Fall Required Braces or Orthoses: Cervical Brace Cervical Brace: Soft collar       Mobility Bed Mobility                  Transfers                      Balance                                   ADL   Eating/Feeding: Moderate assistance;Sitting;With adaptive utensils Eating/Feeding Details (indicate cue type and reason): focus of session on self feeding. Pt given built up  grip bendable fork adjusted to compensate for reduced radial deviation and supination; plate guard and handle cup with lid. PT requires less "loading" of food on utensil with use of plate guard.  Grooming: Therapist, nutritional;Moderate assistance Grooming Details (indicate cue type and reason): able to wipe mouth with cloth. difficulty reaching above mouth level due to weakness   Will monitor for need of splinting.                             General ADL Comments: CNA educated on proper set up to increase independence with self feeding      Vision                     Perception     Praxis      Cognition   Behavior During Therapy: [facility] for tasks assessed/performed Overall Cognitive Status: Impaired/Different from baseline Area of Impairment: Attention;Following commands;Problem solving Orientation Level: Disoriented to;Time Current Attention Level: Sustained Memory: Decreased short-term memory  Following Commands: Follows one step commands consistently Safety/Judgement: Decreased awareness of safety;Decreased awareness of  deficits Awareness: Intellectual Problem Solving: Slow processing Poor insight into deficits. At end of session, pt stating he is ready to go walk around or "do something". Pt states "I'm not that decrepit"      Extremity/Trunk Assessment               Exercises Other Exercises Other Exercises: Increased BUE AROM. R stronger than L. spasticity BUE - flexor. Pt able to demonstrate increased ability to extend digits B, R greater than L. Overall strength @ 3/5 elbow/shoulder. hand/forarm 2+/5 - 3/5.  Other Exercises: gross grasp/release right hand with  increased movement of thumb. decreased pinch strength B. Pt able to demonstrate gross tip pinch R hand, but unable to oppose L thumb to fingers Other Exercises: B UE A/AAROM/general strengthening   Shoulder Instructions       General Comments      Pertinent Vitals/ Pain       Pain Assessment: No/denies pain  Home Living Family/patient expects to be discharged to:: Inpatient rehab                                        Prior Functioning/Environment Level of Independence: Needs assistance  Gait / Transfers Assistance Needed: prior to onset of symptoms patient was independent with mobility ADL's / Homemaking Assistance Needed: needed increased        Frequency Min 2X/week     Progress Toward Goals  OT Goals(current goals can now be found in the care plan section)  Progress towards OT goals: Progressing toward goals  Acute Rehab OT Goals Patient Stated Goal: get back home OT Goal Formulation: With patient Time For Goal Achievement: 09/06/14 ADL Goals Pt Will Perform Eating: with min assist;with adaptive utensils Pt Will Perform Grooming: with min assist;with adaptive equipment;sitting Pt Will Transfer to Toilet: with mod assist;bedside commode Pt Will Perform Toileting - Clothing Manipulation and hygiene: with min assist;sit to/from stand Pt/caregiver will Perform Home Exercise Program: Increased strength;Increased ROM;Both right and left upper extremity;With theraputty;With written HEP provided;Left upper extremity;Right Upper extremity  Plan Discharge plan remains appropriate    Co-evaluation                 End of Session Equipment Utilized During Treatment: Cervical collar   Activity Tolerance Patient tolerated treatment well   Patient Left in chair;with call bell/phone within reach;with chair alarm set   Nurse Communication Mobility status;Other (comment) (proper AE set up for feeding)        Time: 1610-9604 OT Time Calculation (min):  47 min  Charges: OT General Charges $OT Visit: 1 Procedure OT Evaluation $Initial OT Evaluation Tier I: 1 Procedure OT Treatments $Self Care/Home Management : 23-37 mins $Therapeutic Activity: 8-22 mins  Monish Haliburton,HILLARY 08/27/2014, 3:11 PM   [facility], OTR/L  [phone] 08/27/2014

## 2014-08-27 NOTE — Progress Notes (Signed)
Subjective: Patient sitting up in the chair, was able to feed himself with minimal assistance. Patient's nurse reports that he required two-person assist to transfer from bed to chair.  Patient voiding into contact, since Foley cath removed. Accu-Cheks better this morning, but some significant hyperglycemia yesterday.  Objective: Vital signs in last 24 hours: Filed Vitals:   08/27/14 0600 08/27/14 0700 08/27/14 0757 08/27/14 0800  BP: 152/68 144/76  147/70  Pulse: 49 43  56  Temp:   97.8 F (36.6 C)   TempSrc:   Oral   Resp: Height:      Weight:      SpO2: 98% 97%  96%    Intake/Output from previous day: 09/20 0701 - 09/21 0700 In: 740 [P.O.:740] Out: 2235 [Urine:2235] Intake/Output this shift: Total I/O In: 360 [P.O.:360] Out: -   Physical Exam:  Wound healing nicely, no erythema, swelling, or drainage. Mild ecchymosis around the surrounding soft tissues. Significant improvement in quadriparesis: Deltoids are 4+ to 5 bilaterally, left biceps and triceps are 4, right biceps and triceps are 4+, and left intrinsics and grip are 2-3 and right intrinsics and grip are 4 minus.  Assessment/Plan: Patient is making substantial neurologic recovery. Continuing PT and OT. Awaiting comprehensive inpatient rehabilitation. We'll transfer to 4 Velna Hatchet, MD 08/27/2014, 10:19 AM

## 2014-08-27 NOTE — Progress Notes (Signed)
I have insurance approval to admit pt to inpt rehab tomorrow. Pt reluctant to agree. Daughter, son in law and Dr. Jule Ser aware that pt not in total agreement to admit. [place] to discuss with pt in the morning and I am meeting with pt and son in law around 1030 to 11 in the am to discuss inpt rehab admission tomorrow. [phone]

## 2014-08-27 NOTE — Progress Notes (Signed)
Physical Therapy Treatment Patient Details Name: Javier Murillo MRN: [id number] DOB: 1930/07/11 Today's Date: 08/27/2014    History of Present Illness Patient is a 80 y.o. male who is admitted for treatment of cervical stenosis with myelopathy with quadriparesis, secondary to a C3-4 cervical disc herniation superimposed upon a C3 on C4 spondylolisthesis. Patient's difficult to begin after he fell 07/14/2014. Since then he's had a steady progressively worsening weakness in the upper extremities worse than in the lower extremities. He went from ambulating with a single-point cane to requiring a pair single-point cane to 2 requiring a walker to requiring a wheelchair. He can no longer feed himself because the weakness in the upper extremities, he requires assistance with all ADLs including bathing, dressing, and toileting. MRI done this week revealed multilevel spondylosis and degenerative disease was following disc protrusions at all levels, but with a C3 on C4 spondylolisthesis, a C3-4 cervical disc herniation, and resulting severe stenosis at the C3-4 level.     PT Comments    Patient very pleasant and motivated this am. Patient able to progress with mobility this session. Tolerated some ambulation with assist and multi-modal cues. Patient also demonstrating improved functional use of bilateral UEs, able to use UEs to support in sitting and able to push through UEs for power up to standing and for scooting in chair. Will continue to see and progress activity as tolerated.    Follow Up Recommendations  CIR     Equipment Recommendations  Other (comment) (TBD)    Recommendations for Other Services Rehab consult     Precautions / Restrictions Precautions Precautions: Cervical;Fall Required Braces or Orthoses: Cervical Brace Cervical Brace: Soft collar Restrictions Weight Bearing Restrictions: No    Mobility  Bed Mobility Overal bed mobility: Needs Assistance Bed Mobility:  Rolling;Sidelying to Sit Rolling: Mod assist Sidelying to sit: Mod assist       General bed mobility comments: Max VCs for positioning and hand placement, assist to roll and power up, assist for rotation of trunk to EOB  Transfers Overall transfer level: Needs assistance Equipment used: Rolling walker (2 wheeled) Transfers: Sit to/from Stand Sit to Stand: Mod assist;+2 physical assistance         General transfer comment: VCs for push through UEs to come to standing, faciliation provided through bilateral knees to power up.  Ambulation/Gait Ambulation/Gait assistance: Max assist;+2 physical assistance Ambulation Distance (Feet): 16 Feet Assistive device:  (three musketter approach with gait belt and +2) Gait Pattern/deviations: Step-to pattern;Decreased weight shift to right;Decreased stride length;Ataxic;Narrow base of support;Scissoring Gait velocity: decreased   General Gait Details: Max cues for placement and faciliation of weight shift for unloading and tactile cues for upright and postural support. Max cues for quad setting and upright posture    Stairs            Wheelchair Mobility    Modified Rankin (Stroke Patients Only)       Balance   Sitting-balance support: Feet supported Sitting balance-Leahy Scale: Fair     Standing balance support: Bilateral upper extremity supported Standing balance-Leahy Scale: Zero                      Cognition Arousal/Alertness: Awake/alert Behavior During Therapy: WFL for tasks assessed/performed Overall Cognitive Status: Impaired/Different from baseline Area of Impairment: Attention;Following commands;Problem solving   Current Attention Level: Focused Memory: Decreased short-term memory Following Commands: Follows one step commands with increased time     Problem Solving: Slow processing;Decreased initiation;Difficulty  sequencing;Requires verbal cues;Requires tactile cues General Comments: patient very  eager to participate today, intermittant confusion but improved compared to pregvious session.    Exercises Other Exercises Other Exercises: Standing weight shifts performed with maximial assist ( attempted to unload for single leg stnace but unable to perform)    General Comments        Pertinent Vitals/Pain Pain Assessment: No/denies pain    Home Living                      Prior Function            PT Goals (current goals can now be found in the care plan section) Acute Rehab PT Goals Patient Stated Goal: get back home PT Goal Formulation: With patient Time For Goal Achievement: 09/06/14 Potential to Achieve Goals: Fair Progress towards PT goals: Progressing toward goals    Frequency  Min 3X/week    PT Plan Current plan remains appropriate    Co-evaluation             End of Session Equipment Utilized During Treatment: Gait belt;Cervical collar Activity Tolerance: Patient tolerated treatment well Patient left: in chair;with call bell/phone within reach;with chair alarm set     Time: 1610-9604 PT Time Calculation (min): 25 min  Charges:  $Gait Training: 8-22 mins $Therapeutic Activity: 8-22 mins                    G CodesFabio Asa 08/31/2014, 8:50 AM Charlotte Crumb, PT DPT  [phone]

## 2014-08-27 NOTE — Progress Notes (Addendum)
Inpatient Diabetes Program Recommendations  AACE/ADA: New Consensus Statement on Inpatient Glycemic Control (2013)  Target Ranges:  Prepandial:   less than 140 mg/dL      Peak postprandial:   less than 180 mg/dL (1-2 hours)      Critically ill patients:  140 - 180 mg/dL  Results for Javier Murillo, Javier Murillo (MRN [id number]) as of 08/27/2014 14:04  Ref. Range 08/27/2014 07:56 08/27/2014 11:37  Glucose-Capillary Latest Range: 70-99 mg/dL 782 (H) 956 (H)    Consider adding Novolog 4 units TID with meals for elevated postprandial CBGs. (Should not be given if eats <50% of meals) Also recommend changing Novolog correction from Q4 to TID + HS scale.  Thank you  Piedad Climes BSN, RN,CDE Inpatient Diabetes Coordinator [phone] (team pager)

## 2014-08-27 NOTE — PMR Pre-admission (Signed)
PMR Admission Coordinator Pre-Admission Assessment  Patient: Javier Murillo is an 80 y.o., male MRN: [id number] DOB: Dec 11, 1929 Height:  (175.3 cm) Weight: 71 kg (156 lb 8.4 oz)              Insurance Information HMO:     PPO: yes     PCP:      IPA:      80/20:      OTHER: Medicare replacement PRIMARY: A[employer] Medicare      Policy#: [id number]      Subscriber: pt CM Name: Everlean Cherry      Phone#: [phone]     Fax#: [phone] Pre-Cert#: [id number]      Employer: [employer]andra Knudsen retiree. Bertram Denver to follow inhouse [phone] Benefits:  Phone #: online     Name: 08/27/2014 Eff. Date: 12/07/13     Deduct: $250      Out of Pocket Max: $1700      Life Max: none CIR: 90%     SNF: $40 copay per day days 1-20; $156 per day days 21-100 Outpatient: 80%     Co-Pay: 20% no visit limit Home Health: 100%      Co-Pay: no visit limit DME: 80%     Co-Pay: 20% Providers: in network  SECONDARY: none        Medicaid Application Date:       Case Manager:  Disability Application Date:       Case Worker:   Emergency Contact Information Contact Information   Name Relation Home Work Mobile   Furr,Brenda Daughter [phone]     Furr,[name] Other [phone]       Current Medical History  Patient Admitting Diagnosis: C3-4 Myelopathy s./p decompression/fusion. Pt with a central cord presentation  History of Present Illness: Javier Murillo is a 80 y.o. right-handed male with history of hypertension and diabetes mellitus with peripheral neuropathy as well as multiple back surgeries. Presented 08/22/2014 with recent fall 07/14/2014 and progressive weakness in the upper extremities worse in the lower extremities. He was ambulating with a single-point cane and later need of a walker and wheelchair. MRI of cervical spine revealed multilevel spondylosis and degenerative disease with disc protrusions at all levels but with C3 and C4 spondylolisthesis and disc herniation resulting in severe stenosis with myelopathy.  Underwent C3-4 anterior cervical decompression and arthrodesis 08/22/2014 per Dr. Newell Coral. Postoperative pain management. Maintained on Decadron protocol.   Past Medical History  Past Medical History  Diagnosis Date  . Kidney stones     only once  . Hypertension   . Hyperlipidemia   . Arthritis   . Diabetes mellitus without complication     Family History  family history is not on file.  Prior Rehab/Hospitalizations: [facility] since 07/25/2014 due to undiagnosed issue prior to this admission  Current Medications  Current facility-administered medications:0.9 %  sodium chloride infusion, 250 mL, Intravenous, Continuous, Hewitt Shorts, MD;  0.9 %  sodium chloride infusion, , Intravenous, Continuous, Hewitt Shorts, MD;  acetaminophen (TYLENOL) suppository 650 mg, 650 mg, Rectal, Q4H PRN, Hewitt Shorts, MD;  acetaminophen (TYLENOL) tablet 650 mg, 650 mg, Oral, Q4H PRN, Hewitt Shorts, MD alum & mag hydroxide-simeth (MAALOX/MYLANTA) 200-200-20 MG/5ML suspension 30 mL, 30 mL, Oral, Q6H PRN, Hewitt Shorts, MD;  atenolol (TENORMIN) tablet 12.5 mg, 12.5 mg, Oral, q morning - 10a, Hewitt Shorts, MD, 12.5 mg at 08/28/14 0849;  bisacodyl (DULCOLAX) suppository 10 mg, 10 mg, Rectal, Daily PRN, Hewitt Shorts, MD  cholecalciferol (VITAMIN D) tablet 1,000 Units, 1,000 Units, Oral, q morning - 10a, Hewitt Shorts, MD, 1,000 Units at 08/28/14 [phone];  dexamethasone (DECADRON) tablet 0.75 mg, 0.75 mg, Oral, Daily, Hewitt Shorts, MD, 0.75 mg at 08/28/14 9604;  [START ON 08/31/2014] dexamethasone (DECADRON) tablet 0.75 mg, 0.75 mg, Oral, QODAY, Hewitt Shorts, MD ferrous sulfate tablet 325 mg, 325 mg, Oral, Q breakfast, Hewitt Shorts, MD, 325 mg at 08/28/14 0849;  FLUoxetine (PROZAC) capsule 20 mg, 20 mg, Oral, q morning - 10a, Hewitt Shorts, MD, 20 mg at 08/28/14 0849;  fluticasone (FLONASE) 50 MCG/ACT nasal spray 2 spray, 2 spray, Each Nare, Daily, Hewitt Shorts, MD,  2 spray at 08/28/14 0850;  glyBURIDE (DIABETA) tablet 2.5 mg, 2.5 mg, Oral, QPM, Hewitt Shorts, MD, 2.5 mg at 08/27/14 1718 glyBURIDE (DIABETA) tablet 5 mg, 5 mg, Oral, BID WC, Hewitt Shorts, MD, 5 mg at 08/28/14 5409;  HYDROcodone-acetaminophen (NORCO/VICODIN) 5-325 MG per tablet 1-2 tablet, 1-2 tablet, Oral, Q4H PRN, Hewitt Shorts, MD, 2 tablet at 08/25/14 [phone];  hydrOXYzine (ATARAX/VISTARIL) tablet 50 mg, 50 mg, Oral, Q3H PRN, Hewitt Shorts, MD insulin aspart (novoLOG) injection 0-15 Units, 0-15 Units, Subcutaneous, 6 times per day, Hewitt Shorts, MD, 2 Units at 08/27/14 2343;  insulin glargine (LANTUS) injection 19 Units, 19 Units, Subcutaneous, q morning - 10a, Hewitt Shorts, MD, 19 Units at 08/28/14 [phone];  magnesium hydroxide (MILK OF MAGNESIA) suspension 30 mL, 30 mL, Oral, Daily PRN, Hewitt Shorts, MD menthol-cetylpyridinium (CEPACOL) lozenge 3 mg, 1 lozenge, Oral, PRN, Hewitt Shorts, MD;  oxyCODONE-acetaminophen (PERCOCET/ROXICET) 5-325 MG per tablet 1-2 tablet, 1-2 tablet, Oral, Q4H PRN, Hewitt Shorts, MD, 2 tablet at 08/24/14 [phone];  phenol (CHLORASEPTIC) mouth spray 1 spray, 1 spray, Mouth/Throat, PRN, Hewitt Shorts, MD;  sodium chloride 0.9 % injection 3 mL, 3 mL, Intravenous, Q12H, Hewitt Shorts, MD, 3 mL at 08/28/14 0902 sodium chloride 0.9 % injection 3 mL, 3 mL, Intravenous, PRN, Hewitt Shorts, MD  Patients Current Diet: Carb Control  Precautions / Restrictions Precautions Precautions: Cervical;Fall Cervical Brace: Soft collar Restrictions Weight Bearing Restrictions: No   Prior Activity Level Limited Community (1-2x/wk): prior to 07/2014 completely independent and living alone. Stayed with daughter for one week and then admitted to [facility] and went to [facility].  Home Assistive Devices / Equipment Home Assistive Devices/Equipment: Cane (specify quad or straight);Walker (specify type)  Prior Functional Level Prior  Function Level of Independence: Needs assistance Gait / Transfers Assistance Needed: prior to onset of symptoms patient was independent with mobility ADL's / Homemaking Assistance Needed: needed increased   Current Functional Level Cognition  Overall Cognitive Status: Impaired/Different from baseline Current Attention Level: Focused Orientation Level: Oriented to person;Oriented to situation;Oriented to place;Oriented to time (gets side track) Following Commands: Follows one step commands with increased time Safety/Judgement: Decreased awareness of safety;Decreased awareness of deficits General Comments: Spoke with patient regarding reorientation    Extremity Assessment (includes Sensation/Coordination)          ADLs  Overall ADL's : Needs assistance/impaired Eating/Feeding: Moderate assistance;Sitting;With adaptive utensils Eating/Feeding Details (indicate cue type and reason): focus of session on self feeding. Pt given built up  grip bendable fork adjusted to compensate for reduced radial deviation and supination; plate guard and handle cup with lid. PT requires less "loading" of food on utensil with use of plate guard.  Grooming: Therapist, nutritional;Moderate assistance Grooming Details (indicate cue type and reason): able  to wipe mouth with cloth. difficulty reaching above mouth level due to weakness Toilet Transfer: +2 for physical assistance;Maximal assistance;+2 for safety/equipment Toilet Transfer Details (indicate cue type and reason): sit to stand only General ADL Comments: CNA educated on proper set up to increase independence with self feeding    Mobility  Overal bed mobility: Needs Assistance Bed Mobility: Rolling;Sidelying to Sit Rolling: Min assist Sidelying to sit: Min assist General bed mobility comments: Patient with sinificant improvements in ability to open and grip rails to assist himself to EOB and upright.      Transfers  Overall transfer level: Needs  assistance Equipment used:  (use back of chair for UE support for multiple sit to stands ) Transfers: Sit to/from Stand Sit to Stand: Mod assist Stand pivot transfers: Max assist;+2 physical assistance General transfer comment: Cues to push to upright, RLE with some continued weakness requiring support during power up, once standing patient able to self support on chair with forearms. No evidence on hyperextension in right knee this session. Assist to control descent into chair.    Ambulation / Gait / Stairs / Wheelchair Mobility  Ambulation/Gait Ambulation/Gait assistance: Max Environmental consultant (Feet): 8 Feet Assistive device:  (+2 wrap around assist) Gait Pattern/deviations: Step-to pattern;Decreased weight shift to right;Decreased stride length;Ataxic;Narrow base of support;Scissoring Gait velocity: decreased General Gait Details: Cues for upright, support for weight shift and stance on RLE, cues for setting.    Posture / Balance      Special needs/care consideration Bowel mgmt: 08/27/2014 continent Bladder mgmt: foley Diabetic mgmt yes   Previous Home Environment Living Arrangements: Alone  Lives With: Alone;Other (Comment) (lived alone up until 07/2014 when injured) Available Help at Discharge: Family;Available PRN/intermittently Type of Home: House Care Facility Name: [facility] since 07/25/2014 Home Layout: One level Home Access: Stairs to enter Entrance Stairs-Rails: None Entrance Stairs-Number of Steps: 2- 3 steps Bathroom Shower/Tub: Engineer, manufacturing systems: Standard Bathroom Accessibility: Yes How Accessible: Accessible via walker Home Care Services: No  Discharge Living Setting Plans for Discharge Living Setting: Patient's home;Alone Type of Home at Discharge: House Discharge Home Layout: One level Discharge Home Access: Stairs to enter Entrance Stairs-Rails: None Entrance Stairs-Number of Steps: 2 to 3 steps Discharge Bathroom Shower/Tub: Tub/shower  unit Discharge Bathroom Toilet: Standard Discharge Bathroom Accessibility: Yes How Accessible: Accessible via walker Does the patient have any problems obtaining your medications?: No  Social/Family/Support Systems Patient Roles: Parent Contact Information: [name] and Nicola Girt Anticipated Caregiver: daughter and son in law intermittently Anticipated Caregiver's Contact Information: see above Ability/Limitations of Caregiver: daughter and son in law work Caregiver Availability: Intermittent Discharge Plan Discussed with Primary Caregiver: Yes Is Caregiver In Agreement with Plan?: Yes Does Caregiver/Family have Issues with Lodging/Transportation while Pt is in Rehab?: No  Goals/Additional Needs Patient/Family Goal for Rehab: Mod I to s PT, Mod I to s OT Expected length of stay: ELOS 10 to 15 days Equipment Needs: needs soft touch call bell, assistance with feeding Pt/Family Agrees to Admission and willing to participate: Yes Program Orientation Provided & Reviewed with Pt/Caregiver Including Roles  & Responsibilities: Yes  Decrease burden of Care through IP rehab admission: Daughter and son in law hopeful to return home with intermittent assist after d/c rather than need further SNF rehab.  Possible need for SNF placement upon discharge: pt had gone to [facility] 07/25/2014 from [facility] for his central cord issue was undiagnosed at that time. Pt with progressive weakness and neurology workup was ongoing.  Patient  Condition: This patient's medical and functional status has changed since the consult dated: 08/23/2014 in which the Rehabilitation Physician determined and documented that the patient's condition is appropriate for intensive rehabilitative care in an inpatient rehabilitation facility. See "History of Present Illness" (above) for medical update. Functional changes are: overall mod to max assist. Patient's medical and functional status update has been discussed with the Rehabilitation  physician and patient remains appropriate for inpatient rehabilitation. Will admit to inpatient rehab today.  Preadmission Screen Completed By:  Clois Dupes, 08/28/2014 10:47 AM ______________________________________________________________________   Discussed status with Dr. Riley Kill on 08/28/2014 at 1044 and received telephone approval for admission today.  Admission Coordinator:  Clois Dupes, time 4540 Date 08/28/2014.

## 2014-08-28 ENCOUNTER — Inpatient Hospital Stay (HOSPITAL_COMMUNITY)
Admission: RE | Admit: 2014-08-28 | Discharge: 2014-09-11 | DRG: 551 | Disposition: A | Discharge status: Skilled Nursing Facility | Payer: Medicare Other | Source: Intra-hospital | Attending: Physical Medicine & Rehabilitation | Admitting: Physical Medicine & Rehabilitation

## 2014-08-28 ENCOUNTER — Encounter: Payer: Self-pay | Admitting: Neurology

## 2014-08-28 DIAGNOSIS — Z5189 Encounter for other specified aftercare: Secondary | ICD-10-CM

## 2014-08-28 DIAGNOSIS — R531 Weakness: Secondary | ICD-10-CM

## 2014-08-28 DIAGNOSIS — M1990 Unspecified osteoarthritis, unspecified site: Secondary | ICD-10-CM | POA: Diagnosis present

## 2014-08-28 DIAGNOSIS — Z794 Long term (current) use of insulin: Secondary | ICD-10-CM | POA: Diagnosis not present

## 2014-08-28 DIAGNOSIS — K5900 Constipation, unspecified: Secondary | ICD-10-CM | POA: Diagnosis present

## 2014-08-28 DIAGNOSIS — E1049 Type 1 diabetes mellitus with other diabetic neurological complication: Secondary | ICD-10-CM

## 2014-08-28 DIAGNOSIS — G8250 Quadriplegia, unspecified: Secondary | ICD-10-CM | POA: Diagnosis present

## 2014-08-28 DIAGNOSIS — S14129D Central cord syndrome at unspecified level of cervical spinal cord, subsequent encounter: Secondary | ICD-10-CM

## 2014-08-28 DIAGNOSIS — E785 Hyperlipidemia, unspecified: Secondary | ICD-10-CM | POA: Diagnosis present

## 2014-08-28 DIAGNOSIS — M4712 Other spondylosis with myelopathy, cervical region: Principal | ICD-10-CM | POA: Diagnosis present

## 2014-08-28 DIAGNOSIS — Z7982 Long term (current) use of aspirin: Secondary | ICD-10-CM

## 2014-08-28 DIAGNOSIS — M542 Cervicalgia: Secondary | ICD-10-CM | POA: Diagnosis present

## 2014-08-28 DIAGNOSIS — E1142 Type 2 diabetes mellitus with diabetic polyneuropathy: Secondary | ICD-10-CM | POA: Diagnosis present

## 2014-08-28 DIAGNOSIS — B9620 Unspecified Escherichia coli [E. coli] as the cause of diseases classified elsewhere: Secondary | ICD-10-CM | POA: Diagnosis present

## 2014-08-28 DIAGNOSIS — Z87891 Personal history of nicotine dependence: Secondary | ICD-10-CM

## 2014-08-28 DIAGNOSIS — E1149 Type 2 diabetes mellitus with other diabetic neurological complication: Secondary | ICD-10-CM

## 2014-08-28 DIAGNOSIS — N390 Urinary tract infection, site not specified: Secondary | ICD-10-CM | POA: Diagnosis present

## 2014-08-28 DIAGNOSIS — E1121 Type 2 diabetes mellitus with diabetic nephropathy: Secondary | ICD-10-CM

## 2014-08-28 DIAGNOSIS — F329 Major depressive disorder, single episode, unspecified: Secondary | ICD-10-CM | POA: Diagnosis present

## 2014-08-28 DIAGNOSIS — I739 Peripheral vascular disease, unspecified: Secondary | ICD-10-CM | POA: Diagnosis present

## 2014-08-28 DIAGNOSIS — I10 Essential (primary) hypertension: Secondary | ICD-10-CM | POA: Diagnosis present

## 2014-08-28 LAB — GLUCOSE, CAPILLARY
GLUCOSE-CAPILLARY: 284 mg/dL — AB (ref 70–99)
Glucose-Capillary: 156 mg/dL — ABNORMAL HIGH (ref 70–99)
Glucose-Capillary: 103 mg/dL — ABNORMAL HIGH (ref 70–99)
Glucose-Capillary: 87 mg/dL (ref 70–99)
Glucose-Capillary: 258 mg/dL — ABNORMAL HIGH (ref 70–99)

## 2014-08-28 MED ORDER — FERROUS SULFATE 325 (65 FE) MG PO TABS
325.0000 mg | ORAL_TABLET | Freq: Every day | ORAL | Status: DC
  Administered 2014-08-29 – 2014-09-11 (×14): 325 mg via ORAL
  Filled 2014-08-28 (×15): qty 1

## 2014-08-28 MED ORDER — INSULIN ASPART 100 UNIT/ML ~~LOC~~ SOLN: 0.0000 [IU] | SUBCUTANEOUS | Status: DC

## 2014-08-28 MED ORDER — INSULIN ASPART 100 UNIT/ML ~~LOC~~ SOLN
0.0000 [IU] | Freq: Three times a day (TID) | SUBCUTANEOUS | Status: DC
  Administered 2014-08-28: 8 [IU] via SUBCUTANEOUS

## 2014-08-28 MED ORDER — DEXAMETHASONE 0.75 MG PO TABS
0.7500 mg | ORAL_TABLET | ORAL | Status: AC
  Administered 2014-08-31 – 2014-09-02 (×2): 0.75 mg via ORAL
  Filled 2014-08-28 (×3): qty 1

## 2014-08-28 MED ORDER — SORBITOL 70 % SOLN: 30.0000 mL | Freq: Every day | Status: DC | PRN

## 2014-08-28 MED ORDER — DEXAMETHASONE 0.75 MG PO TABS
0.7500 mg | ORAL_TABLET | Freq: Every day | ORAL | Status: AC
  Administered 2014-08-29 – 2014-08-30 (×2): 0.75 mg via ORAL
  Filled 2014-08-28 (×2): qty 1

## 2014-08-28 MED ORDER — GLYBURIDE 5 MG PO TABS
5.0000 mg | ORAL_TABLET | Freq: Two times a day (BID) | ORAL | Status: DC
  Administered 2014-08-29 (×2): 5 mg via ORAL
  Filled 2014-08-28 (×3): qty 1

## 2014-08-28 MED ORDER — BISACODYL 10 MG RE SUPP: 10.0000 mg | Freq: Every day | RECTAL | Status: DC | PRN

## 2014-08-28 MED ORDER — ONDANSETRON HCL 4 MG PO TABS: 4.0000 mg | ORAL_TABLET | Freq: Four times a day (QID) | ORAL | Status: DC | PRN

## 2014-08-28 MED ORDER — HYDROCODONE-ACETAMINOPHEN 5-325 MG PO TABS: 1.0000 | ORAL_TABLET | ORAL | Status: DC | PRN

## 2014-08-28 MED ORDER — ONDANSETRON HCL 4 MG/2ML IJ SOLN
4.0000 mg | Freq: Four times a day (QID) | INTRAMUSCULAR | Status: DC | PRN
Start: 2014-08-28 — End: 2014-09-11

## 2014-08-28 MED ORDER — ACETAMINOPHEN 325 MG PO TABS
325.0000 mg | ORAL_TABLET | ORAL | Status: DC | PRN
  Administered 2014-08-31 – 2014-09-06 (×8): 650 mg via ORAL
  Filled 2014-08-28 (×8): qty 2

## 2014-08-28 MED ORDER — INSULIN GLARGINE 100 UNIT/ML ~~LOC~~ SOLN
19.0000 [IU] | Freq: Every morning | SUBCUTANEOUS | Status: DC
  Administered 2014-08-29: 19 [IU] via SUBCUTANEOUS
  Filled 2014-08-28 (×2): qty 0.19

## 2014-08-28 MED ORDER — GLYBURIDE 2.5 MG PO TABS
2.5000 mg | ORAL_TABLET | Freq: Every evening | ORAL | Status: DC
  Administered 2014-08-28: 2.5 mg via ORAL
  Filled 2014-08-28 (×2): qty 1

## 2014-08-28 MED ORDER — FLUTICASONE PROPIONATE 50 MCG/ACT NA SUSP
2.0000 | Freq: Every day | NASAL | Status: DC
  Administered 2014-08-29 – 2014-08-31 (×3): 2 via NASAL
  Filled 2014-08-28: qty 16

## 2014-08-28 MED ORDER — ATENOLOL 12.5 MG HALF TABLET
12.5000 mg | ORAL_TABLET | Freq: Every morning | ORAL | Status: DC
  Administered 2014-08-29 – 2014-09-10 (×13): 12.5 mg via ORAL
  Filled 2014-08-28 (×15): qty 1

## 2014-08-28 MED ORDER — FLUOXETINE HCL 20 MG PO CAPS
20.0000 mg | ORAL_CAPSULE | Freq: Every morning | ORAL | Status: DC
  Administered 2014-08-29 – 2014-09-11 (×14): 20 mg via ORAL
  Filled 2014-08-28 (×16): qty 1

## 2014-08-28 MED ORDER — VITAMIN D3 25 MCG (1000 UNIT) PO TABS
1000.0000 [IU] | ORAL_TABLET | Freq: Every morning | ORAL | Status: DC
  Administered 2014-08-29 – 2014-09-11 (×14): 1000 [IU] via ORAL
  Filled 2014-08-28 (×15): qty 1

## 2014-08-28 NOTE — Progress Notes (Addendum)
Medicare IM (Important Message) delivered to patient today by me in anticipation of discharge.   UR completed.  Carlyle Lipa, RN BSN MHA CCM  Case Manager, Trauma Service/Unit 57M [phone]

## 2014-08-28 NOTE — Discharge Summary (Signed)
Physician Discharge Summary  Patient ID: Javier Murillo MRN: [id number] DOB/AGE: 1930-06-28 80 y.o.  Admit date: 08/22/2014 Discharge date: 08/28/2014  Admission Diagnoses:  C3-4 cervical herniated disc with myelopathy, C3-4 spondylolisthesis, cervical spondylosis with myelopathy, cervical degenerative disc disease with myelopathy, cervical stenosis  Discharge Diagnoses:  C3-4 cervical herniated disc with myelopathy, C3-4 spondylolisthesis, cervical spondylosis with myelopathy, cervical degenerative disc disease with myelopathy, cervical stenosis  Active Problems:   HNP (herniated nucleus pulposus) with myelopathy, cervical   Discharged Condition: good  Hospital Course: Patient was admitted and underwent urgent C3-4 ACDF because of severe progressive quadriparesis due to spinal cord compression from a C3-4 cervical disc patient sustained in a fall in early August. Postoperatively he is made substantial recovery, with significant recovery of motor function. At this time the left hand intrinsics are 3, right hand intrinsics are 4 minus, the grips are 4 minus bilaterally, the left iliopsoas is 4+ to 5, and the right iliopsoas is 5. His wound is healing well. He is immobilized in a soft cervical collar. Postoperatively he has been seen in consultation by PT and OT, and also was seen in consultation by PM&R regarding CIR.  He is to be transferred today to the [facility] inpatient rehabilitation unit.  He should shower each day without cervical collar. He was started on Decadron at the time of surgery, and has been tapered, and the taper will continue for the next 5 days. The glue on his incision should be removed within 1 week. He will need to return for followup with me in the office about 3-4 weeks following discharge from the rehabilitation unit.  Discharge Exam: Blood pressure 128/66, pulse 58, temperature 97.8 F (36.6 C), temperature source Oral, resp. rate 18, height  (1.753 m), weight  71 kg (156 lb 8.4 oz), SpO2 98.00%.  Disposition:  [facility] inpatient rehabilitation     Medication List    STOP taking these medications       acetaminophen 325 MG tablet  Commonly known as:  TYLENOL     aspirin EC 81 MG tablet     gabapentin 100 MG capsule  Commonly known as:  NEURONTIN     insulin aspart 100 UNIT/ML injection  Commonly known as:  novoLOG     meclizine 12.5 MG tablet  Commonly known as:  ANTIVERT     vitamin C 500 MG tablet  Commonly known as:  ASCORBIC ACID     vitamin E 400 UNIT capsule     zinc gluconate 50 MG tablet      TAKE these medications       atenolol 25 MG tablet  Commonly known as:  TENORMIN  Take 12.5 mg by mouth every morning.     cholecalciferol 1000 UNITS tablet  Commonly known as:  VITAMIN D  Take 1,000 Units by mouth every morning.     ferrous sulfate 325 (65 FE) MG tablet  Take 325 mg by mouth daily with breakfast.     FLUoxetine 20 MG capsule  Commonly known as:  PROZAC  Take 20 mg by mouth every morning.     fluticasone 50 MCG/ACT nasal spray  Commonly known as:  FLONASE  Place 2 sprays into both nostrils daily.     glyBURIDE 5 MG tablet  Commonly known as:  DIABETA  Take 5 mg by mouth 2 (two) times daily with a meal. *takes at breakfast and lunch*     glyBURIDE 2.5 MG tablet  Commonly known as:  DIABETA  Take 2.5 mg by mouth every evening.     insulin glargine 100 UNIT/ML injection  Commonly known as:  LANTUS  Inject 19 Units into the skin every morning.     MULTIVITAMIN PO  Take 1 tablet by mouth daily.         Signed: Hewitt Shorts, MD 08/28/2014, 8:22 AM

## 2014-08-28 NOTE — Progress Notes (Signed)
Physical Therapy Treatment Patient Details Name: Javier Murillo MRN: [id number] DOB: 12-30-1929 Today's Date: 08/28/2014    History of Present Illness Patient is a 80 y.o. male who is admitted for treatment of cervical stenosis with myelopathy with quadriparesis, secondary to a C3-4 cervical disc herniation superimposed upon a C3 on C4 spondylolisthesis. Patient's difficult to begin after he fell 07/14/2014. Since then he's had a steady progressively worsening weakness in the upper extremities worse than in the lower extremities. He went from ambulating with a single-point cane to requiring a pair single-point cane to 2 requiring a walker to requiring a wheelchair. He can no longer feed himself because the weakness in the upper extremities, he requires assistance with all ADLs including bathing, dressing, and toileting. MRI done this week revealed multilevel spondylosis and degenerative disease was following disc protrusions at all levels, but with a C3 on C4 spondylolisthesis, a C3-4 cervical disc herniation, and resulting severe stenosis at the C3-4 level.     PT Comments    Patient continues to make significant progress with strength and function today. Performed various standing activities with support. Less physical assist needed overall.   Follow Up Recommendations  CIR     Equipment Recommendations  Other (comment) (TBD)    Recommendations for Other Services Rehab consult     Precautions / Restrictions Precautions Precautions: Cervical;Fall Required Braces or Orthoses: Cervical Brace Cervical Brace: Soft collar Restrictions Weight Bearing Restrictions: No    Mobility  Bed Mobility Overal bed mobility: Needs Assistance Bed Mobility: Rolling;Sidelying to Sit Rolling: Min assist Sidelying to sit: Min assist       General bed mobility comments: Patient with sinificant improvements in ability to open and grip rails to assist himself to EOB and upright.     Transfers Overall transfer level: Needs assistance Equipment used:  (use back of chair for UE support for multiple sit to stands ) Transfers: Sit to/from Stand Sit to Stand: Mod assist         General transfer comment: Cues to push to upright, RLE with some continued weakness requiring support during power up, once standing patient able to self support on chair with forearms. No evidence on hyperextension in right knee this session. Assist to control descent into chair.  Ambulation/Gait Ambulation/Gait assistance: Max assist Ambulation Distance (Feet): 8 Feet Assistive device:  (+2 wrap around assist)   Gait velocity: decreased   General Gait Details: Cues for upright, support for weight shift and stance on RLE, cues for setting.   Stairs            Wheelchair Mobility    Modified Rankin (Stroke Patients Only)       Balance Overall balance assessment: Needs assistance Sitting-balance support: Feet supported Sitting balance-Leahy Scale: Fair     Standing balance support: Bilateral upper extremity supported Standing balance-Leahy Scale: Poor                      Cognition Arousal/Alertness: Awake/alert Behavior During Therapy: WFL for tasks assessed/performed Overall Cognitive Status: Impaired/Different from baseline Area of Impairment: Attention;Following commands;Problem solving   Current Attention Level: Focused Memory: Decreased short-term memory Following Commands: Follows one step commands with increased time     Problem Solving: Slow processing;Decreased initiation;Difficulty sequencing;Requires verbal cues;Requires tactile cues General Comments: Spoke with patient regarding reorientation    Exercises Other Exercises Other Exercises: static standing, weight shifts using BUE support on chair back Other Exercises: attempted sign leg stance, able to perform on  LLE briefly, unable to perform on RLE at this time    General Comments         Pertinent Vitals/Pain Pain Assessment: No/denies pain    Home Living                      Prior Function            PT Goals (current goals can now be found in the care plan section) Acute Rehab PT Goals PT Goal Formulation: With patient Time For Goal Achievement: 09/06/14 Potential to Achieve Goals: Good Progress towards PT goals: Progressing toward goals    Frequency  Min 3X/week    PT Plan Current plan remains appropriate    Co-evaluation             End of Session Equipment Utilized During Treatment: Gait belt;Cervical collar Activity Tolerance: Patient tolerated treatment well Patient left: in chair;with call bell/phone within reach;with chair alarm set     Time: 0812-0839 PT Time Calculation (min): 27 min  Charges:  $Therapeutic Activity: 23-37 mins                    G CodesFabio Asa 2014-09-19, 10:32 AM Charlotte Crumb, PT DPT  [phone]

## 2014-08-28 NOTE — Progress Notes (Signed)
I met with pt and his son in law at bedside. Pt is in agreement to admission to inpt rehab today. I will arrange. [phone]

## 2014-08-28 NOTE — Progress Notes (Signed)
Physical Medicine and Rehabilitation Consult Reason for Consult: Cervical stenosis with myelopathy Referring Physician: Dr. Nudelman     HPI: Javier Murillo is a 80 y.o. right-handed male with history of hypertension and diabetes mellitus with peripheral neuropathy as well as multiple back surgeries. Presented 08/22/2014 with recent fall 07/14/2014 and progressive weakness in the upper extremities worse in the lower extremities. He was ambulating with a single-point cane and later need of a walker and wheelchair. MRI of cervical spine revealed multilevel spondylosis and degenerative disease with disc protrusions at all levels but with C3 and C4 spondylolisthesis and disc herniation resulting in severe stenosis with myelopathy. Underwent C3-4 anterior cervical decompression and arthrodesis 08/22/2014 per Dr. Newell Coral. Postoperative pain management. Maintained on Decadron protocol. Physical and occupational therapy evaluations are pending. M.D. has requested physical medicine rehabilitation consult     Review of Systems  Gastrointestinal: Positive for constipation.  Musculoskeletal: Positive for falls, myalgias and neck pain.  Neurological: Positive for weakness.  Psychiatric/Behavioral: Positive for depression.  Past Medical History   Diagnosis  Date   .  Kidney stones         only once   .  Hypertension     .  Hyperlipidemia     .  Arthritis     .  Diabetes mellitus without complication      Past Surgical History   Procedure  Laterality  Date   .  Back surgery           x 7   .  Eye surgery           bilateral cataracts   .  Bone spur removed  right sholder       .  Appendectomy        History reviewed. No pertinent family history. Social History: reports that he has quit smoking. He does not have any smokeless tobacco history on file. He reports that he does not drink alcohol or use illicit drugs. Allergies:   Allergies   Allergen  Reactions   .  Fluvirin [Influenza  Virus Vaccine Split]         Gave patient flu    Medications Prior to Admission   Medication  Sig  Dispense  Refill   .  acetaminophen (TYLENOL) 325 MG tablet  Take 650 mg by mouth 2 (two) times daily.         Marland Kitchen  aspirin EC 81 MG tablet  Take 81 mg by mouth every morning.         Marland Kitchen  atenolol (TENORMIN) 25 MG tablet  Take 12.5 mg by mouth every morning.         .  cholecalciferol (VITAMIN D) 1000 UNITS tablet  Take 1,000 Units by mouth every morning.         .  ferrous sulfate 325 (65 FE) MG tablet  Take 325 mg by mouth daily with breakfast.         .  FLUoxetine (PROZAC) 20 MG capsule  Take 20 mg by mouth every morning.         .  fluticasone (FLONASE) 50 MCG/ACT nasal spray  Place 2 sprays into both nostrils daily.         Marland Kitchen  gabapentin (NEURONTIN) 100 MG capsule  Take 100-200 mg by mouth 2 (two) times daily. *takes  in the morning and  in the evening*         .  glyBURIDE (DIABETA) 2.Newell Coraltablet  Take 2.5 mg by mouth every evening.         .  glyBURIDE (DIABETA) 5 MG tablet  Take 5 mg by mouth 2 (two) times daily with a meal. *takes at breakfast and lunch*         .  insulin aspart (NOVOLOG) 100 UNIT/ML injection  Inject 0-8 Units into the skin 3 (three) times daily before meals. <100=0 units, 100-150=2 units, 151-200=4 units, 201-250=6 units, >250= 8 units         .  insulin glargine (LANTUS) 100 UNIT/ML injection  Inject 19 Units into the skin every morning.         .  meclizine (ANTIVERT) 12.5 MG tablet  Take 12.5 mg by mouth 2 (two) times daily as needed for dizziness.         .  Multiple Vitamins-Minerals (MULTIVITAMIN PO)  Take 1 tablet by mouth daily.         .  vitamin C (ASCORBIC ACID) 500 MG tablet  Take 500 mg by mouth 2 (two) times daily.         .  vitamin E 400 UNIT capsule  Take 400 Units by mouth daily.         Marland Kitchen  zinc gluconate 50 MG tablet  Take 50 mg by mouth 2 (two) times daily.            Home: Home Living Living Arrangements: Alone   Functional  History: Functional Status:   Mobility:   ADL:   Cognition: Cognition Orientation Level: Oriented to person;Oriented to situation;Oriented to time   Blood pressure 144/67, pulse 82, temperature 97.6 F (36.4 C), temperature source Oral, resp. rate 11, height  (1.753 m), weight 71 kg (156 lb 8.4 oz), SpO2 96.00%. Physical Exam  Vitals reviewed. Constitutional: He appears well-developed.  HENT:   Head: Normocephalic.  Eyes: EOM are normal.  Neck: Neck supple. No thyromegaly present.  Cardiovascular: Normal rate and regular rhythm.   Respiratory: Effort normal and breath sounds normal. No respiratory distress.  GI: Soft. Bowel sounds are normal. He exhibits no distension.  Neurological: He is alert.  His voice is a bit hoarse but intelligible. He provides his name and age as well as address. Follows simple commands. Sensory loss to PP/LT in hands. MMT: deltoids 4/5. Bicep/triceps 4/5. Wrist 4-, HI 1+ to 2. LE: HF 3+, KE 4-, ADF/APF 4/5. No resting tone. DTR's 1+. Delayed responses at time.   Skin:  Surgical neck incision clean and dry  Psychiatric: He has a normal mood and affect. His behavior is normal.     Results for orders placed during the hospital encounter of 08/22/14 (from the past 24 hour(s))   GLUCOSE, CAPILLARY     Status: Abnormal     Collection Time      08/22/14  3:35 PM       Result  Value  Ref Range     Glucose-Capillary  161 (*)  70 - 99 mg/dL   CBC     Status: Abnormal     Collection Time      08/22/14  4:01 PM       Result  Value  Ref Range     WBC  9.5   4.0 - 10.5 K/uL     RBC  3.86 (*)  4.22 - 5.81 MIL/uL     Hemoglobin  12.6 (*)  13.0 - 17.0 g/dL     HCT  69.6 (*)  29.5 - 52.0 %  MCV  100.0   78.0 - 100.0 fL     MCH  32.6   26.0 - 34.0 pg     MCHC  32.6   30.0 - 36.0 g/dL     RDW  16.1   09.6 - 15.5 %     Platelets  270   150 - 400 K/uL   COMPREHENSIVE METABOLIC PANEL     Status: Abnormal     Collection Time      08/22/14  4:01 PM        Result  Value  Ref Range     Sodium  138   137 - 147 mEq/L     Potassium  4.5   3.7 - 5.3 mEq/L     Chloride  101   96 - 112 mEq/L     CO2  25   19 - 32 mEq/L     Glucose, Bld  156 (*)  70 - 99 mg/dL     BUN  34 (*)  6 - 23 mg/dL     Creatinine, Ser  0.45   0.50 - 1.35 mg/dL     Calcium  9.6   8.4 - 10.5 mg/dL     Total Protein  6.4   6.0 - 8.3 g/dL     Albumin  3.6   3.5 - 5.2 g/dL     AST  26   0 - 37 U/L     ALT  33   0 - 53 U/L     Alkaline Phosphatase  78   39 - 117 U/L     Total Bilirubin  0.2 (*)  0.3 - 1.2 mg/dL     GFR calc non Af Amer  60 (*)  >90 mL/min     GFR calc Af Amer  69 (*)  >90 mL/min     Anion gap  12   5 - 15   GLUCOSE, CAPILLARY     Status: Abnormal     Collection Time      08/22/14  5:50 PM       Result  Value  Ref Range     Glucose-Capillary  114 (*)  70 - 99 mg/dL   GLUCOSE, CAPILLARY     Status: Abnormal     Collection Time      08/22/14  9:53 PM       Result  Value  Ref Range     Glucose-Capillary  167 (*)  70 - 99 mg/dL   GLUCOSE, CAPILLARY     Status: Abnormal     Collection Time      08/23/14 12:17 AM       Result  Value  Ref Range     Glucose-Capillary  222 (*)  70 - 99 mg/dL     Comment 1  Notify RN      MRSA PCR SCREENING     Status: None     Collection Time      08/23/14 12:43 AM       Result  Value  Ref Range     MRSA by PCR  NEGATIVE   NEGATIVE   GLUCOSE, CAPILLARY     Status: Abnormal     Collection Time      08/23/14  4:05 AM       Result  Value  Ref Range     Glucose-Capillary  226 (*)  70 - 99 mg/dL    Dg Cervical Spine 2-3 Views   08/22/2014  CLINICAL DATA:  Cervical disc disease.  EXAM: CERVICAL SPINE - 2-3 VIEW  COMPARISON:  None.  FINDINGS: Radiograph 1 demonstrates a needle at the C3-4 level. Radiograph 2 demonstrates the patient has undergone anterior cervical fusion at C3-4. Alignment is now near anatomic. Anterior plate and screws and interbody plug are in place.  IMPRESSION: Anterior fusion performed at C3-4.  Improved  alignment.   Electronically Signed   By: Geanie Cooley M.D.   On: 08/22/2014 21:20     Assessment/Plan: Diagnosis: C3-4 Myelopathy s./p decompression/fusion. Pt with a central cord presentation Does the need for close, 24 hr/day medical supervision in concert with the patient's rehab needs make it unreasonable for this patient to be served in a less intensive setting? Yes Co-Morbidities requiring supervision/potential complications: pain, htn, dm Due to bladder management, bowel management, safety, skin/wound care, disease management, medication administration, pain management and patient education, does the patient require 24 hr/day rehab nursing? Yes Does the patient require coordinated care of a physician, rehab nurse, PT (1-2 hrs/day, 5 days/week) and OT (1-2 hrs/day, 5 days/week) to address physical and functional deficits in the context of the above medical diagnosis(es)? Yes Addressing deficits in the following areas: balance, endurance, locomotion, strength, transferring, bowel/bladder control, bathing, dressing, feeding, grooming, toileting and psychosocial support Can the patient actively participate in an intensive therapy program of at least 3 hrs of therapy per day at least 5 days per week? Yes The potential for patient to make measurable gains while on inpatient rehab is excellent Anticipated functional outcomes upon discharge from inpatient rehab are modified independent  with PT, modified independent and supervision with OT, n/a with SLP. Estimated rehab length of stay to reach the above functional goals is: likely 10-15 days Does the patient have adequate social supports to accommodate these discharge functional goals? Yes Anticipated D/C setting: Home Anticipated post D/C treatments: HH therapy and Outpatient therapy Overall Rehab/Functional Prognosis: excellent   RECOMMENDATIONS: This patient's condition is appropriate for continued rehabilitative care in the following setting:  CIR Patient has agreed to participate in recommended program. Yes Note that insurance prior authorization may be required for reimbursement for recommended care.   Comment: Rehab Admissions Coordinator to follow up.   Thanks,   Ranelle Oyster, MD, [place] [facility]         08/23/2014    Revision History...     Date/Time User Action   08/23/2014 10:35 AM Ranelle Oyster, MD Sign   08/23/2014 6:13 AM Charlton Amor, PA-C Pend  View Details Report   Routing History...     Date/Time From To Method   08/23/2014 10:35 AM Ranelle Oyster, MD Ranelle Oyster, MD In Basket

## 2014-08-28 NOTE — Progress Notes (Signed)
Pt's daughter, Neomia Dear was notified of pt's new room number on Rehab unit.

## 2014-08-28 NOTE — Progress Notes (Signed)
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 Bertram Denver to follow inhouse [phone]  Benefits: Phone #: online Name: 08/27/2014  Eff. Date: 12/07/13 Deduct: $250 Out of Pocket Max: $1700 Life Max: none  CIR: 90% SNF: $40 copay per day days 1-20; $156 per day days 21-100  Outpatient: 80% Co-Pay: 20% no visit limit  Home Health: 100% Co-Pay: no visit limit  DME: 80% Co-Pay: 20%  Providers: in network   SECONDARY: none  Medicaid Application Date: Case Manager:  Disability Application Date: Case Worker:  Emergency Contact Information    Contact Information     Name  Relation  Home  Work  Mobile     Furr,Brenda  Daughter  [phone]       Furr,[name]  Other  [phone]          Current Medical History  Patient Admitting Diagnosis: C3-4 Myelopathy s./p decompression/fusion. Pt with a central cord presentation  History of Present Illness: Javier Murillo is a 80 y.o. right-handed male with history of hypertension and diabetes mellitus with peripheral neuropathy as well as multiple back surgeries. Presented 08/22/2014 with recent fall 07/14/2014 and progressive weakness in the upper extremities worse in the lower extremities. He was ambulating with a single-point cane and later need of a walker and wheelchair. MRI of cervical spine revealed multilevel spondylosis and degenerative disease with disc protrusions at all levels but with C3 and C4 spondylolisthesis and disc herniation resulting in severe stenosis with myelopathy. Underwent C3-4 anterior cervical decompression and arthrodesis 08/22/2014 per Dr.  Newell Coral. Postoperative pain management. Maintained on Decadron protocol.  Past Medical History    Past Medical History    Diagnosis  Date    .  Kidney stones       only once    .  Hypertension     .  Hyperlipidemia     .  Arthritis     .  Diabetes mellitus without complication      Family History  family history is not on file.  Prior Rehab/Hospitalizations: [facility] since 07/25/2014 due to undiagnosed issue prior to this admission  Current Medications  Current facility-administered medications:0.9 % sodium chloride infusion, 250 mL, Intravenous, Continuous, Hewitt Shorts, MD; 0.9 % sodium chloride infusion, , Intravenous, Continuous, Hewitt Shorts, MD; acetaminophen (TYLENOL) suppository 650 mg, 650 mg, Rectal, Q4H PRN, Hewitt Shorts, MD; acetaminophen (TYLENOL) tablet 650 mg, 650 mg, Oral, Q4H PRN, Hewitt Shorts, MD  alum & mag hydroxide-simeth (MAALOX/MYLANTA) 200-200-20 MG/5ML suspension 30 mL, 30 mL, Oral, Q6H PRN, Hewitt Shorts, MD; atenolol (TENORMIN) tablet 12.5 mg, 12.5 mg, Oral, q morning - 10a, Hewitt Shorts, MD, 12.5 mg at 08/28/14 0849; bisacodyl (DULCOLAX) suppository 10 mg, 10 mg, Rectal, Daily PRN, Hewitt Shorts, MD  cholecalciferol (VITAMIN D) tablet 1,000 Units, 1,000 Units, Oral, q morning - 10a, Hewitt Shorts, MD, 1,000 Units at 08/28/14 0849; dexamethasone (DECADRON) tablet 0.75 mg, 0.75 mg, Oral, Daily, Hewitt Shorts, MD, 0.75 mg at 08/28/14 0904; [START ON 08/31/2014] dexamethasone (DECADRON) tablet 0.75 mg, 0.75 mg, Oral, QODAY, Hewitt Shorts, MD  ferrous sulfate tablet 325  mg, 325 mg, Oral, Q breakfast, Hewitt Shorts, MD, 325 mg at 08/28/14 0849; FLUoxetine (PROZAC) capsule 20 mg, 20 mg, Oral, q morning - 10a, Hewitt Shorts, MD, 20 mg at 08/28/14 0849; fluticasone (FLONASE) 50 MCG/ACT nasal spray 2 spray, 2 spray, Each Nare, Daily, Hewitt Shorts, MD, 2 spray at 08/28/14 0850; glyBURIDE (DIABETA) tablet 2.5 mg, 2.5 mg,  Oral, QPM, Hewitt Shorts, MD, 2.5 mg at 08/27/14 1718  glyBURIDE (DIABETA) tablet 5 mg, 5 mg, Oral, BID WC, Hewitt Shorts, MD, 5 mg at 08/28/14 1610; HYDROcodone-acetaminophen (NORCO/VICODIN) 5-325 MG per tablet 1-2 tablet, 1-2 tablet, Oral, Q4H PRN, Hewitt Shorts, MD, 2 tablet at 08/25/14 [phone]; hydrOXYzine (ATARAX/VISTARIL) tablet 50 mg, 50 mg, Oral, Q3H PRN, Hewitt Shorts, MD  insulin aspart (novoLOG) injection 0-15 Units, 0-15 Units, Subcutaneous, 6 times per day, Hewitt Shorts, MD, 2 Units at 08/27/14 2343; insulin glargine (LANTUS) injection 19 Units, 19 Units, Subcutaneous, q morning - 10a, Hewitt Shorts, MD, 19 Units at 08/28/14 [phone]; magnesium hydroxide (MILK OF MAGNESIA) suspension 30 mL, 30 mL, Oral, Daily PRN, Hewitt Shorts, MD  menthol-cetylpyridinium (CEPACOL) lozenge 3 mg, 1 lozenge, Oral, PRN, Hewitt Shorts, MD; oxyCODONE-acetaminophen (PERCOCET/ROXICET) 5-325 MG per tablet 1-2 tablet, 1-2 tablet, Oral, Q4H PRN, Hewitt Shorts, MD, 2 tablet at 08/24/14 [phone]; phenol (CHLORASEPTIC) mouth spray 1 spray, 1 spray, Mouth/Throat, PRN, Hewitt Shorts, MD; sodium chloride 0.9 % injection 3 mL, 3 mL, Intravenous, Q12H, Hewitt Shorts, MD, 3 mL at 08/28/14 0902  sodium chloride 0.9 % injection 3 mL, 3 mL, Intravenous, PRN, Hewitt Shorts, MD  Patients Current Diet: Carb Control  Precautions / Restrictions  Precautions  Precautions: Cervical;Fall  Cervical Brace: Soft collar  Restrictions  Weight Bearing Restrictions: No  Prior Activity Level  Limited Community (1-2x/wk): prior to 07/2014 completely independent and living alone. Stayed with daughter for one week and then admitted to [facility] and went to [facility] SN[facility].  Home Assistive Devices / Equipment  Home Assistive Devices/Equipment: Cane (specify quad or straight);Walker (specify type)  Prior Functional Level  Prior Function  Level of Independence: Needs assistance  Gait / Transfers Assistance  Needed: prior to onset of symptoms patient was independent with mobility  ADL's / Homemaking Assistance Needed: needed increased  Current Functional Level    Cognition  Overall Cognitive Status: Impaired/Different from baseline  Current Attention Level: Focused  Orientation Level: Oriented to person;Oriented to situation;Oriented to place;Oriented to time (gets side track)  Following Commands: Follows one step commands with increased time  Safety/Judgement: Decreased awareness of safety;Decreased awareness of deficits  General Comments: Spoke with patient regarding reorientation    Extremity Assessment  (includes Sensation/Coordination)      ADLs  Overall ADL's : Needs assistance/impaired  Eating/Feeding: Moderate assistance;Sitting;With adaptive utensils  Eating/Feeding Details (indicate cue type and reason): focus of session on self feeding. Pt given built up grip bendable fork adjusted to compensate for reduced radial deviation and supination; plate guard and handle cup with lid. PT requires less "loading" of food on utensil with use of plate guard.  Grooming: Therapist, nutritional;Moderate assistance  Grooming Details (indicate cue type and reason): able to wipe mouth with cloth. difficulty reaching above mouth level due to weakness  Toilet Transfer: +2 for physical assistance;Maximal assistance;+2 for safety/equipment  Toilet Transfer Details (indicate cue type and reason): sit to stand only  General ADL Comments: CNA educated on proper set up to increase independence with self feeding  Mobility  Overal bed mobility: Needs Assistance  Bed Mobility: Rolling;Sidelying to Sit  Rolling: Min assist  Sidelying to sit: Min assist  General bed mobility comments: Patient with sinificant improvements in ability to open and grip rails to assist himself to EOB and upright.    Transfers  Overall transfer level: Needs assistance  Equipment used: (use back of chair for UE support for multiple sit to stands  )  Transfers: Sit to/from Stand  Sit to Stand: Mod assist  Stand pivot transfers: Max assist;+2 physical assistance  General transfer comment: Cues to push to upright, RLE with some continued weakness requiring support during power up, once standing patient able to self support on chair with forearms. No evidence on hyperextension in right knee this session. Assist to control descent into chair.    Ambulation / Gait / Stairs / Wheelchair Mobility  Ambulation/Gait  Ambulation/Gait assistance: Max Designer, television/film set (Feet): 8 Feet  Assistive device: (+2 wrap around assist)  Gait Pattern/deviations: Step-to pattern;Decreased weight shift to right;Decreased stride length;Ataxic;Narrow base of support;Scissoring  Gait velocity: decreased  General Gait Details: Cues for upright, support for weight shift and stance on RLE, cues for setting.    Posture / Balance     Special needs/care consideration  Bowel mgmt: 08/27/2014 continent  Bladder mgmt: foley  Diabetic mgmt yes    Previous Home Environment  Living Arrangements: Alone  Lives With: Alone;Other (Comment) (lived alone up until 07/2014 when injured)  Available Help at Discharge: Family;Available PRN/intermittently  Type of Home: House  Care Facility Name: [facility] since 07/25/2014  Home Layout: One level  Home Access: Stairs to enter  Entrance Stairs-Rails: None  Entrance Stairs-Number of Steps: 2- 3 steps  Bathroom Shower/Tub: Medical sales representative: Standard  Bathroom Accessibility: Yes  How Accessible: Accessible via walker  Home Care Services: No  Discharge Living Setting  Plans for Discharge Living Setting: Patient's home;Alone  Type of Home at Discharge: House  Discharge Home Layout: One level  Discharge Home Access: Stairs to enter  Entrance Stairs-Rails: None  Entrance Stairs-Number of Steps: 2 to 3 steps  Discharge Bathroom Shower/Tub: Tub/shower unit  Discharge Bathroom Toilet: Standard  Discharge Bathroom  Accessibility: Yes  How Accessible: Accessible via walker  Does the patient have any problems obtaining your medications?: No  Social/Family/Support Systems  Patient Roles: Parent  Contact Information: [name] and Nicola Girt  Anticipated Caregiver: daughter and son in law intermittently  Anticipated Caregiver's Contact Information: see above  Ability/Limitations of Caregiver: daughter and son in law work  Caregiver Availability: Intermittent  Discharge Plan Discussed with Primary Caregiver: Yes  Is Caregiver In Agreement with Plan?: Yes  Does Caregiver/Family have Issues with Lodging/Transportation while Pt is in Rehab?: No  Goals/Additional Needs  Patient/Family Goal for Rehab: Mod I to s PT, Mod I to s OT  Expected length of stay: ELOS 10 to 15 days  Equipment Needs: needs soft touch call bell, assistance with feeding  Pt/Family Agrees to Admission and willing to participate: Yes  Program Orientation Provided & Reviewed with Pt/Caregiver Including Roles & Responsibilities: Yes  Decrease burden of Care through IP rehab admission: Daughter and son in law hopeful to return home with intermittent assist after d/c rather than need further SNF rehab.  Possible need for SNF placement upon discharge: pt had gone to [facility] 07/25/2014 from [facility] for his central cord issue was undiagnosed at that time. Pt with progressive weakness and neurology workup was ongoing.  Patient Condition:  This patient's medical and functional status has changed since the consult dated: 08/23/2014 in which the Rehabilitation Physician determined and documented that the patient's condition is appropriate for intensive rehabilitative care in an inpatient rehabilitation facility. See "History of Present Illness" (above) for medical update. Functional changes are: overall mod to max assist. Patient's medical and functional status update has been discussed with the Rehabilitation physician and patient remains appropriate for  inpatient rehabilitation. Will admit to inpatient rehab today.  Preadmission Screen Completed By: Clois Dupes, 08/28/2014 10:47 AM  ______________________________________________________________________  Discussed status with Dr. Riley Kill on 08/28/2014 at 1044 and received telephone approval for admission today.  Admission Coordinator: Clois Dupes, time 8119 Date 08/28/2014.    Cosigned by: Ranelle Oyster, MD [08/28/2014 11:19 AM]

## 2014-08-28 NOTE — H&P (Signed)
Physical Medicine and Rehabilitation Admission H&P  No chief complaint on file.  : Chief complaint: Neck pain  HPI: Javier Murillo is a 80 y.o. right-handed male with history of hypertension and insulin-dependent diabetes mellitus with peripheral neuropathy as well as multiple back surgeries. Presented 08/22/2014 with recent fall 07/14/2014 and progressive weakness in the upper extremities worse in the lower extremities. He was ambulating with a single-point cane and later need of a walker and wheelchair. MRI of cervical spine revealed multilevel spondylosis and degenerative disease with disc protrusions at all levels but with C3 and C4 spondylolisthesis and disc herniation resulting in severe stenosis with myelopathy. Underwent C3-4 anterior cervical decompression and arthrodesis 08/22/2014 per Dr. Sherwood Gambler. Postoperative pain management. Maintained on Decadron protocol. Physical and occupational therapy evaluations completed 08/23/2014. M.D. has requested physical medicine rehabilitation consult  ROS Review of Systems  Gastrointestinal: Positive for constipation.  Musculoskeletal: Positive for falls, myalgias and neck pain.  Neurological: Positive for weakness.  Psychiatric/Behavioral: Positive for depression  Remaining review of systems negative  Past Medical History   Diagnosis  Date   .  Kidney stones      only once   .  Hypertension    .  Hyperlipidemia    .  Arthritis    .  Diabetes mellitus without complication     Past Surgical History   Procedure  Laterality  Date   .  Back surgery       x 7   .  Eye surgery       bilateral cataracts   .  Bone spur removed right sholder     .  Appendectomy     .  Anterior cervical decomp/discectomy fusion  N/A  08/22/2014     Procedure: ANTERIOR CERVICAL DECOMPRESSION/DISCECTOMY FUSION, cervical three-four; Surgeon: Hosie Spangle, MD; Location: [facility]; Service: Neurosurgery; Laterality: N/A; C3-4 anterior cervical decompression with  fusion plating and bonegraft    History reviewed. No pertinent family history.  Social History: reports that he has quit smoking. He does not have any smokeless tobacco history on file. He reports that he does not drink alcohol or use illicit drugs.  Allergies:  Allergies   Allergen  Reactions   .  Fluvirin [Influenza Virus Vaccine Split]      Gave patient flu    Medications Prior to Admission   Medication  Sig  Dispense  Refill   .  acetaminophen (TYLENOL) 325 MG tablet  Take 650 mg by mouth 2 (two) times daily.     Marland Kitchen  aspirin EC 81 MG tablet  Take 81 mg by mouth every morning.     Marland Kitchen  atenolol (TENORMIN) 25 MG tablet  Take 12.5 mg by mouth every morning.     .  cholecalciferol (VITAMIN D) 1000 UNITS tablet  Take 1,000 Units by mouth every morning.     .  ferrous sulfate 325 (65 FE) MG tablet  Take 325 mg by mouth daily with breakfast.     .  FLUoxetine (PROZAC) 20 MG capsule  Take 20 mg by mouth every morning.     .  fluticasone (FLONASE) 50 MCG/ACT nasal spray  Place 2 sprays into both nostrils daily.     Marland Kitchen  gabapentin (NEURONTIN) 100 MG capsule  Take 100-200 mg by mouth 2 (two) times daily. *takes 161m in the morning and 2066min the evening*     .  glyBURIDE (DIABETA) 2.5 MG tablet  Take 2.5 mg by mouth every  evening.     .  glyBURIDE (DIABETA) 5 MG tablet  Take 5 mg by mouth 2 (two) times daily with a meal. *takes at breakfast and lunch*     .  insulin aspart (NOVOLOG) 100 UNIT/ML injection  Inject 0-8 Units into the skin 3 (three) times daily before meals. <100=0 units, 100-150=2 units, 151-200=4 units, 201-250=6 units, >250= 8 units     .  insulin glargine (LANTUS) 100 UNIT/ML injection  Inject 19 Units into the skin every morning.     .  meclizine (ANTIVERT) 12.5 MG tablet  Take 12.5 mg by mouth 2 (two) times daily as needed for dizziness.     .  Multiple Vitamins-Minerals (MULTIVITAMIN PO)  Take 1 tablet by mouth daily.     .  vitamin C (ASCORBIC ACID) 500 MG tablet  Take 500 mg  by mouth 2 (two) times daily.     .  vitamin E 400 UNIT capsule  Take 400 Units by mouth daily.     Marland Kitchen  zinc gluconate 50 MG tablet  Take 50 mg by mouth 2 (two) times daily.      Home:  Home Living  Family/patient expects to be discharged to:: Inpatient rehab  Living Arrangements: Alone  Functional History:  Prior Function  Level of Independence: Needs assistance  Gait / Transfers Assistance Needed: prior to onset of symptoms patient was independent with mobility  ADL's / Homemaking Assistance Needed: needed increased  Functional Status:  Mobility:  Bed Mobility  Overal bed mobility: Needs Assistance  Bed Mobility: Rolling;Sidelying to Sit  Rolling: Mod assist  Sidelying to sit: Mod assist  Transfers  Overall transfer level: Needs assistance  Equipment used: Rolling walker (2 wheeled)  Transfers: Sit to/from Stand  Sit to Stand: Mod assist;+2 physical assistance  Ambulation/Gait  Ambulation/Gait assistance: Max assist;+2 physical assistance  Ambulation Distance (Feet): 16 Feet  Assistive device: (three musketter approach with gait belt and +2)  Gait Pattern/deviations: Step-to pattern;Decreased weight shift to right;Decreased stride length;Ataxic;Narrow base of support;Scissoring  Gait velocity: decreased  General Gait Details: Max cues for placement and faciliation of weight shift for unloading and tactile cues for upright and postural support. Max cues for quad setting and upright posture    General transfer comment: VCs for push through UEs to come to standing, faciliation provided through bilateral knees to power up.  General bed mobility comments: Max VCs for positioning and hand placement, assist to roll and power up, assist for rotation of trunk to EOB     ADL:  Eating/Feeding: Moderate assistance;Sitting;With adaptive utensils  Eating/Feeding Details (indicate cue type and reason): focus of session on self feeding. Pt given built up grip bendable fork adjusted to  compensate for reduced radial deviation and supination; plate guard and handle cup with lid. PT requires less "loading" of food on utensil with use of plate guard.  Grooming: Dance movement psychotherapist;Moderate assistance  Grooming Details (indicate cue type and reason): able to wipe mouth with cloth. difficulty reaching above mouth level due to weakness  Will monitor for need of splinting.  General ADL Comments: CNA educated on proper set up to increase independence with self feeding  Cognition:  Cognition  Overall Cognitive Status: Impaired/Different from baseline  Orientation Level: Oriented to person;Oriented to time;Oriented to situation;Disoriented to place  Cognition  Arousal/Alertness: Awake/alert  Behavior During Therapy: WFL for tasks assessed/performed  Overall Cognitive Status: Impaired/Different from baseline  Physical Exam:  Blood pressure 167/86, pulse 72, temperature 98.1 F (36.7 C),  temperature source Oral, resp. rate 18, height _0  (1.753 m), weight 71 kg (156 lb 8.4 oz), SpO2 95.00%.  Physical Exam  Constitutional: He appears well-developed.  HENT:  Head: Normocephalic.  Eyes: EOM are normal.  Neck: Neck supple. No thyromegaly present.  Cardiovascular: Normal rate and regular rhythm.  Respiratory: Effort normal and breath sounds normal. No respiratory distress.  GI: Soft. Bowel sounds are normal. He exhibits no distension.  Neurological: He is alert.  His voice remains hoarse but intelligible. He provides his name and age as well as address. Follows simple commands. Very tangential in thought processing. Sl hard of hearing.  Sensory loss to PP/LT in hands. MMT: deltoids 4/5. Bicep/triceps 4/5. Wrist 4-, Right HI 3- to 3/5. Left HI 1+ to 2. LE: HF 3+, KE 4-, ADF/APF 4/5. No resting tone. DTR's 1+. Delayed responses at time.  Skin:  Surgical neck incision clean and dry  Psychiatric: He has a normal mood and affect. His behavior is normal  Results for orders placed during the  hospital encounter of 08/22/14 (from the past 48 hour(s))   GLUCOSE, CAPILLARY Status: Abnormal    Collection Time    08/22/14 3:35 PM   Result  Value  Ref Range    Glucose-Capillary  161 (*)  70 - 99 mg/dL   CBC Status: Abnormal    Collection Time    08/22/14 4:01 PM   Result  Value  Ref Range    WBC  9.5  4.0 - 10.5 K/uL    RBC  3.86 (*)  4.22 - 5.81 MIL/uL    Hemoglobin  12.6 (*)  13.0 - 17.0 g/dL    HCT  38.6 (*)  39.0 - 52.0 %    MCV  100.0  78.0 - 100.0 fL    MCH  32.6  26.0 - 34.0 pg    MCHC  32.6  30.0 - 36.0 g/dL    RDW  12.8  11.5 - 15.5 %    Platelets  270  150 - 400 K/uL   COMPREHENSIVE METABOLIC PANEL Status: Abnormal    Collection Time    08/22/14 4:01 PM   Result  Value  Ref Range    Sodium  138  137 - 147 mEq/L    Potassium  4.5  3.7 - 5.3 mEq/L    Chloride  101  96 - 112 mEq/L    CO2  25  19 - 32 mEq/L    Glucose, Bld  156 (*)  70 - 99 mg/dL    BUN  34 (*)  6 - 23 mg/dL    Creatinine, Ser  1.11  0.50 - 1.35 mg/dL    Calcium  9.6  8.4 - 10.5 mg/dL    Total Protein  6.4  6.0 - 8.3 g/dL    Albumin  3.6  3.5 - 5.2 g/dL    AST  26  0 - 37 U/L    ALT  33  0 - 53 U/L    Alkaline Phosphatase  78  39 - 117 U/L    Total Bilirubin  0.2 (*)  0.3 - 1.2 mg/dL    GFR calc non Af Amer  60 (*)  >90 mL/min    GFR calc Af Amer  69 (*)  >90 mL/min    Comment:  (NOTE)     The eGFR has been calculated using the CKD EPI equation.     This calculation has not been validated in all clinical situations.  eGFR's persistently <90 mL/min signify possible Chronic Kidney     Disease.    Anion gap  12  5 - 15   GLUCOSE, CAPILLARY Status: Abnormal    Collection Time    08/22/14 5:50 PM   Result  Value  Ref Range    Glucose-Capillary  114 (*)  70 - 99 mg/dL   GLUCOSE, CAPILLARY Status: Abnormal    Collection Time    08/22/14 9:53 PM   Result  Value  Ref Range    Glucose-Capillary  167 (*)  70 - 99 mg/dL   GLUCOSE, CAPILLARY Status: Abnormal    Collection Time    08/23/14  12:17 AM   Result  Value  Ref Range    Glucose-Capillary  222 (*)  70 - 99 mg/dL    Comment 1  Notify RN    MRSA PCR SCREENING Status: None    Collection Time    08/23/14 12:43 AM   Result  Value  Ref Range    MRSA by PCR  NEGATIVE  NEGATIVE    Comment:      The GeneXpert MRSA Assay (FDA     approved for NASAL specimens     only), is one component of a     comprehensive MRSA colonization     surveillance program. It is not     intended to diagnose MRSA     infection nor to guide or     monitor treatment for     MRSA infections.   GLUCOSE, CAPILLARY Status: Abnormal    Collection Time    08/23/14 4:05 AM   Result  Value  Ref Range    Glucose-Capillary  226 (*)  70 - 99 mg/dL   GLUCOSE, CAPILLARY Status: Abnormal    Collection Time    08/23/14 8:14 AM   Result  Value  Ref Range    Glucose-Capillary  272 (*)  70 - 99 mg/dL    Comment 1  Notify RN     Comment 2  Documented in Chart    GLUCOSE, CAPILLARY Status: Abnormal    Collection Time    08/23/14 12:02 PM   Result  Value  Ref Range    Glucose-Capillary  269 (*)  70 - 99 mg/dL    Comment 1  Notify RN     Comment 2  Documented in Chart    GLUCOSE, CAPILLARY Status: Abnormal    Collection Time    08/23/14 3:54 PM   Result  Value  Ref Range    Glucose-Capillary  302 (*)  70 - 99 mg/dL   GLUCOSE, CAPILLARY Status: Abnormal    Collection Time    08/23/14 7:58 PM   Result  Value  Ref Range    Glucose-Capillary  244 (*)  70 - 99 mg/dL   GLUCOSE, CAPILLARY Status: Abnormal    Collection Time    08/24/14 12:09 AM   Result  Value  Ref Range    Glucose-Capillary  220 (*)  70 - 99 mg/dL   GLUCOSE, CAPILLARY Status: Abnormal    Collection Time    08/24/14 3:45 AM   Result  Value  Ref Range    Glucose-Capillary  207 (*)  70 - 99 mg/dL    Dg Cervical Spine 2-3 Views  08/22/2014 CLINICAL DATA: Cervical disc disease. EXAM: CERVICAL SPINE - 2-3 VIEW COMPARISON: None. FINDINGS: Radiograph 1 demonstrates a needle at the C3-4  level. Radiograph 2 demonstrates the patient has undergone anterior cervical fusion  at C3-4. Alignment is now near anatomic. Anterior plate and screws and interbody plug are in place. IMPRESSION: Anterior fusion performed at C3-4. Improved alignment. Electronically Signed By: Rozetta Nunnery M.D. On: 08/22/2014 21:20   Medical Problem List and Plan:  1. Functional deficits secondary to C3-4 myelopathy status post decompression fusion. Central cord presentation.  2. DVT Prophylaxis/Anticoagulation: SCDs. Monitor for any signs of DVT. Check vascular study  3. Pain Management: Hydrocodone as needed. Monitor with increased mobility  4. Mood/depression: Prozac 20 mg daily. Provide emotional support and  5. Neuropsych: This patient is capable of making decisions on his own behalf.  6. Skin/Wound Care: Routine skin checks  7. Diabetes mellitus with peripheral neuropathy. Monitor blood sugars closely while on Decadron. DiaBeta 5 mg twice a day and 2.5 mg every evening, Lantus insulin 19 units daily  8. Hypertension. Tenormin 12.5 mg daily. Monitor with increased mobility   Post Admission Physician Evaluation:  1. Functional deficits secondary to C3-4 myelopathy s/p decompression/fusion with central cord presentation. 2. Patient is admitted to receive collaborative, interdisciplinary care between the physiatrist, rehab nursing staff, and therapy team. 3. Patient's level of medical complexity and substantial therapy needs in context of that medical necessity cannot be provided at a lesser intensity of care such as a SNF. 4. Patient has experienced substantial functional loss from his/her baseline which was documented above under the "Functional History" and "Functional Status" headings. Judging by the patient's diagnosis, physical exam, and functional history, the patient has potential for functional progress which will result in measurable gains while on inpatient rehab. These gains will be of substantial and  practical use upon discharge in facilitating mobility and self-care at the household level. 5. Physiatrist will provide 24 hour management of medical needs as well as oversight of the therapy plan/treatment and provide guidance as appropriate regarding the interaction of the two. 6. 24 hour rehab nursing will assist with bladder management, bowel management, safety, skin/wound care, disease management, medication administration, pain management and patient education and help integrate therapy concepts, techniques,education, etc. 7. PT will assess and treat for/with: Lower extremity strength, range of motion, stamina, balance, functional mobility, safety, adaptive techniques and equipment, pain mgt, NMR, SCI education, ego support. Goals are: supervision to min assist. 8. OT will assess and treat for/with: ADL's, functional mobility, safety, upper extremity strength, adaptive techniques and equipment, pain mgt, NMR, leisure awareness, community reintegration. Goals are: min assist to mod assist. Therapy may proceed with showering this patient. 9. SLP will assess and treat for/with: n/a at this time Goals are: n/a ----may consider eval for speech/phonation. 10. Case Management and Social Worker will assess and treat for psychological issues and discharge planning. 11. Team conference will be held weekly to assess progress toward goals and to determine barriers to discharge. 12. Patient will receive at least 3 hours of therapy per day at least 5 days per week. 13. ELOS: 20-25 days  14. Prognosis: excellent  Meredith Staggers, MD, [facility] 08/28/2014   08/24/2014

## 2014-08-28 NOTE — Progress Notes (Signed)
Blood sugar was 187 patient had on wrong armband. New arm band put on patient.

## 2014-08-28 NOTE — Progress Notes (Signed)
Report given to 4 Chad (inpatient rehab) RN, Ed. Pt will be admitted to room 4W15.  Pt transferred via bed with belongings, escorted by unit staff.

## 2014-08-29 ENCOUNTER — Inpatient Hospital Stay (HOSPITAL_COMMUNITY): Payer: Medicare Other | Admitting: Physical Therapy

## 2014-08-29 ENCOUNTER — Inpatient Hospital Stay (HOSPITAL_COMMUNITY): Payer: Medicare Other

## 2014-08-29 ENCOUNTER — Inpatient Hospital Stay (HOSPITAL_COMMUNITY): Payer: Medicare Other | Admitting: Occupational Therapy

## 2014-08-29 DIAGNOSIS — M7989 Other specified soft tissue disorders: Secondary | ICD-10-CM

## 2014-08-29 LAB — COMPREHENSIVE METABOLIC PANEL
ALT: 107 U/L — AB (ref 0–53)
AST: 53 U/L — ABNORMAL HIGH (ref 0–37)
Albumin: 3.3 g/dL — ABNORMAL LOW (ref 3.5–5.2)
Alkaline Phosphatase: 76 U/L (ref 39–117)
Anion gap: 13 (ref 5–15)
BUN: 35 mg/dL — AB (ref 6–23)
CHLORIDE: 97 meq/L (ref 96–112)
CO2: 27 mEq/L (ref 19–32)
CREATININE: 1.47 mg/dL — AB (ref 0.50–1.35)
Calcium: 9.4 mg/dL (ref 8.4–10.5)
GFR calc non Af Amer: 43 mL/min — ABNORMAL LOW (ref >90)
GFR, EST AFRICAN AMERICAN: 49 mL/min — AB (ref >90)
Glucose, Bld: 391 mg/dL — ABNORMAL HIGH (ref 70–99)
POTASSIUM: 5.2 meq/L (ref 3.7–5.3)
SODIUM: 137 meq/L (ref 137–147)
Total Bilirubin: 0.6 mg/dL (ref 0.3–1.2)
Total Protein: 6.3 g/dL (ref 6.0–8.3)

## 2014-08-29 LAB — CBC WITH DIFFERENTIAL/PLATELET
BASOS ABS: 0 10*3/uL (ref 0.0–0.1)
Basophils Relative: 0 % (ref 0–1)
Eosinophils Absolute: 0.2 10*3/uL (ref 0.0–0.7)
Eosinophils Relative: 1 % (ref 0–5)
HCT: 41.3 % (ref 39.0–52.0)
HEMOGLOBIN: 13.7 g/dL (ref 13.0–17.0)
Lymphocytes Relative: 13 % (ref 12–46)
Lymphs Abs: 1.8 10*3/uL (ref 0.7–4.0)
MCH: 32.9 pg (ref 26.0–34.0)
MCHC: 33.2 g/dL (ref 30.0–36.0)
MCV: 99 fL (ref 78.0–100.0)
Monocytes Absolute: 0.9 10*3/uL (ref 0.1–1.0)
Monocytes Relative: 7 % (ref 3–12)
NEUTROS ABS: 10.4 10*3/uL — AB (ref 1.7–7.7)
Neutrophils Relative %: 79 % — ABNORMAL HIGH (ref 43–77)
PLATELETS: 330 10*3/uL (ref 150–400)
RBC: 4.17 MIL/uL — ABNORMAL LOW (ref 4.22–5.81)
RDW: 12.4 % (ref 11.5–15.5)
WBC: 13.3 10*3/uL — ABNORMAL HIGH (ref 4.0–10.5)

## 2014-08-29 LAB — GLUCOSE, CAPILLARY
GLUCOSE-CAPILLARY: 187 mg/dL — AB (ref 70–99)
GLUCOSE-CAPILLARY: 51 mg/dL — AB (ref 70–99)
GLUCOSE-CAPILLARY: 63 mg/dL — AB (ref 70–99)
Glucose-Capillary: 96 mg/dL (ref 70–99)
Glucose-Capillary: 272 mg/dL — ABNORMAL HIGH (ref 70–99)
Glucose-Capillary: 357 mg/dL — ABNORMAL HIGH (ref 70–99)
Glucose-Capillary: 242 mg/dL — ABNORMAL HIGH (ref 70–99)
Glucose-Capillary: 171 mg/dL — ABNORMAL HIGH (ref 70–99)

## 2014-08-29 MED ORDER — GLYBURIDE 5 MG PO TABS
5.0000 mg | ORAL_TABLET | Freq: Two times a day (BID) | ORAL | Status: DC
  Administered 2014-08-30 – 2014-09-04 (×12): 5 mg via ORAL
  Filled 2014-08-29 (×15): qty 1

## 2014-08-29 MED ORDER — INSULIN ASPART 100 UNIT/ML ~~LOC~~ SOLN
0.0000 [IU] | Freq: Three times a day (TID) | SUBCUTANEOUS | Status: DC
  Administered 2014-08-29: 9 [IU] via SUBCUTANEOUS
  Administered 2014-08-29: 3 [IU] via SUBCUTANEOUS
  Administered 2014-08-30: 5 [IU] via SUBCUTANEOUS
  Administered 2014-08-30: 7 [IU] via SUBCUTANEOUS
  Administered 2014-08-30: 2 [IU] via SUBCUTANEOUS
  Administered 2014-08-31: 5 [IU] via SUBCUTANEOUS
  Administered 2014-08-31: 2 [IU] via SUBCUTANEOUS
  Administered 2014-08-31: 7 [IU] via SUBCUTANEOUS
  Administered 2014-09-01: 5 [IU] via SUBCUTANEOUS
  Administered 2014-09-01 – 2014-09-02 (×2): 7 [IU] via SUBCUTANEOUS
  Administered 2014-09-02: 5 [IU] via SUBCUTANEOUS
  Administered 2014-09-03: 3 [IU] via SUBCUTANEOUS
  Administered 2014-09-03 – 2014-09-04 (×2): 5 [IU] via SUBCUTANEOUS
  Administered 2014-09-04: 3 [IU] via SUBCUTANEOUS
  Administered 2014-09-05: 9 [IU] via SUBCUTANEOUS
  Administered 2014-09-06: 2 [IU] via SUBCUTANEOUS
  Administered 2014-09-06: 9 [IU] via SUBCUTANEOUS
  Administered 2014-09-07: 2 [IU] via SUBCUTANEOUS
  Administered 2014-09-07: 7 [IU] via SUBCUTANEOUS
  Administered 2014-09-07: 3 [IU] via SUBCUTANEOUS
  Administered 2014-09-08: 5 [IU] via SUBCUTANEOUS
  Administered 2014-09-08: 9 [IU] via SUBCUTANEOUS
  Administered 2014-09-09: 2 [IU] via SUBCUTANEOUS
  Administered 2014-09-09: 9 [IU] via SUBCUTANEOUS
  Administered 2014-09-10 (×2): 7 [IU] via SUBCUTANEOUS
  Administered 2014-09-11: 5 [IU] via SUBCUTANEOUS

## 2014-08-29 NOTE — Evaluation (Addendum)
Occupational Therapy Assessment and Plan  Patient Details  Name: Javier Murillo MRN: [id number] Date of Birth: 1930/05/16  OT Diagnosis: abnormal posture, cognitive deficits, muscle weakness (generalized), quadriparesis at C3-4 level, decreased coordination, and decreased sensation Rehab Potential: Rehab Potential: Fair ELOS: 2-3 weeks   Today's Date: 08/29/2014 OT Individual Time: 0800-0900 OT Individual Time Calculation (min): 60 min     Problem List:  Patient Active Problem List   Diagnosis Date Noted  . Central cord syndrome 08/28/2014  . HNP (herniated nucleus pulposus) with myelopathy, cervical 08/22/2014  . Quadriplegia and quadriparesis 08/20/2014  . Gait disorder 08/20/2014  . Weakness 07/23/2014  . Diabetes mellitus with renal complications 58/08/9832  . Diabetic neuropathy 07/23/2014  . Acute renal failure 07/23/2014  . HTN (hypertension) 07/23/2014  . Dyslipidemia 07/23/2014    Past Medical History:  Past Medical History  Diagnosis Date  . Quadriplegia and quadriparesis 08/20/2014  . Diabetic peripheral neuropathy   . Gait disorder   . Peripheral vascular disease   . Degenerative arthritis   . Lumbosacral spondylosis   . Depression   . Chronic low back pain   . Kidney stones     only once  . Hypertension   . Hyperlipidemia   . Arthritis   . Diabetes mellitus without complication    Past Surgical History:  Past Surgical History  Procedure Laterality Date  . Shoulder surgery Bilateral   . Cataract extraction w/ intraocular lens  implant, bilateral    . Bursitis Bilateral     olecranon I&D  . Lumbar spine surgery      x7  . Back surgery      x 7  . Eye surgery      bilateral cataracts  . Bone spur removed  right sholder    . Appendectomy    . Anterior cervical decomp/discectomy fusion N/A 08/22/2014    Procedure: ANTERIOR CERVICAL DECOMPRESSION/DISCECTOMY FUSION, cervical three-four;  Surgeon: Hosie Spangle, MD;  Location: [facility];   Service: Neurosurgery;  Laterality: N/A;  C3-4 anterior cervical decompression with fusion plating and bonegraft    Assessment & Plan Clinical Impression: Javier Murillo is a 80 y.o. right-handed male with history of hypertension and insulin-dependent diabetes mellitus with peripheral neuropathy as well as multiple back surgeries. Presented 08/22/2014 from [facility] with recent fall 07/14/2014 and progressive weakness in the upper extremities worse in the lower extremities. He was ambulating with a single-point cane and later need of a walker and wheelchair. MRI of cervical spine revealed multilevel spondylosis and degenerative disease with disc protrusions at all levels but with C3 and C4 spondylolisthesis and disc herniation resulting in severe stenosis with myelopathy. Underwent C3-4 anterior cervical decompression and arthrodesis 08/22/2014 per Dr. Sherwood Gambler. Postoperative pain management. Maintained on Decadron protocol. Patient transferred to CIR on 08/28/2014 .    Patient currently requires total with basic self-care skills secondary to muscle weakness and paraparesis, unbalanced muscle activation, decreased coordination and decreased sensation & proprioception, decreased midline orientation, decreased initiation, decreased attention, decreased awareness, decreased safety awareness and delayed processing, apraxia(?) and decreased sitting balance, decreased standing balance, decreased postural control and decreased balance strategies.  Prior to fall on 07/14/14, patient was modified independent and living alone.  After fall, patient experienced progressive weakness resulting in moving to daughters home then to [facility] prior to admission to CIR.  Patient will benefit from skilled intervention to decrease level of assist with basic self-care skills prior to discharge home with care partner.  Anticipate patient will require minimal physical assistance and follow up home health.  OT - End of  Session Activity Tolerance: Tolerates 30+ min activity with multiple rests Endurance Deficit: Yes Endurance Deficit Description: cardiorespiratory - pt demonstrates loud breathing with MMT.  OT Assessment Rehab Potential: Good Barriers to Discharge: Decreased caregiver support Barriers to Discharge Comments: no family present to confirm level of support OT Patient demonstrates impairments in the following area(s): Balance;Behavior;Cognition;Endurance;Motor;Sensory;Safety OT Basic ADL's Functional Problem(s): Eating;Grooming;Bathing;Dressing;Toileting OT Transfers Functional Problem(s): Toilet;Tub/Shower OT Additional Impairment(s): Fuctional Use of Upper Extremity (BUEs) OT Plan OT Intensity: Minimum of 1-2 x/day, 45 to 90 minutes OT Frequency: 5 out of 7 days OT Duration/Estimated Length of Stay: 2-3 weeks OT Treatment/Interventions: Balance/vestibular training;Cognitive remediation/compensation;Discharge planning;DME/adaptive equipment instruction;Neuromuscular re-education;Functional mobility training;Patient/family education;Psychosocial support;Self Care/advanced ADL retraining;UE/LE Strength taining/ROM;Therapeutic Exercise;Therapeutic Activities;UE/LE Coordination activities;Wheelchair propulsion/positioning OT Self Feeding Anticipated Outcome(s): Supervision/set up and AE PRN OT Basic Self-Care Anticipated Outcome(s): Min-Mod OT Toileting Anticipated Outcome(s): Mod (2/3 tasks) OT Bathroom Transfers Anticipated Outcome(s): Min assist OT Recommendation Recommendations for Other Services: Speech consult Patient destination: Home Follow Up Recommendations: Home health OT;24 hour supervision/assistance Equipment Recommended: Tub/shower bench;3 in 1 bedside comode   Skilled Therapeutic Intervention Patient eating breakfast in bed upon arrival.  OT evaluation and self care retraining to include self feeding, sponge bath (declined shower today due to low blood sugar), and dress.   Focused session on initiation, attention, activity tolerance, bed mobility, sitting balance, postural control in sitting and standing, functional mobility, safe transfers, sit><stands, and forced use of BUEs.    OT Evaluation Precautions/Restrictions  Precautions Precautions: Cervical;Fall Required Braces or Orthoses: Cervical Brace Cervical Brace: Soft collar (can be removed for bathing per Dr. Naaman Plummer) Restrictions Weight Bearing Restrictions: No Pain Denies pain Home Living/Prior Functioning Home Living Available Help at Discharge: Family;Available PRN/intermittently Type of Home: House (Admitted to CIR from [facility]) Home Access: Stairs to enter [employer] of Steps: 2 steps from carport to the kitchen Entrance Stairs-Rails: None;Right Home Layout: One level Additional Comments: Patient admitted to CIR from [facility].  Prior to [facility], patient has been living with daughter and son in law for about a week due to decline in function following fall 07/14/14.  Prior to fall, patient was living alone and independent.  Lives With: Alone (prior to fall 07/14/14, lived alone in a house) Prior Function Level of Independence: Requires assistive device for independence (prior to fall 07/14/14)  Able to Take Stairs?: Yes Vocation: Retired Comments: [place] mod I PLOF ADL Refer to FIM below for details Vision/Perception  Vision- History Baseline Vision/History: Wears glasses Wears Glasses: At all times Patient Visual Report: No change from baseline Vision- Assessment Vision Assessment?: No apparent visual deficits  Cognition Overall Cognitive Status: No family/caregiver present to determine baseline cognitive functioning Arousal/Alertness: Awake/alert Orientation Level: Oriented X4 Attention: Sustained;Selective Sustained Attention: Impaired Sustained Attention Impairment: Verbal basic;Functional basic Selective Attention: Impaired Selective Attention Impairment: Verbal  basic;Functional basic Memory: Impaired (baseline, per pt report-however no family present to confirm) Memory Impairment: Decreased recall of new information Awareness: Impaired Awareness Impairment: Anticipatory impairment;Emergent impairment Problem Solving: Impaired Problem Solving Impairment: Functional basic;Verbal basic Executive Function: Sequencing;Decision Making;Organizing Sequencing: Impaired Organizing: Impaired Decision Making: Impaired Behaviors: Poor frustration tolerance Safety/Judgment: Impaired Sensation Sensation Light Touch: Impaired by gross assessment (peripheral neuropathy and numbness in bilateral fingers) Additional Comments: When seated EOB, patient supporting self on left wrist in flexion and unaware.  At least 75% of the time, patient required verbal cues to "Open hand"  prior to attempt to use hand. Coordination Gross Motor Movements are Fluid and Coordinated: No Fine Motor Movements are Fluid and Coordinated: Not tested Coordination and Movement Description: jerky movements Motor  Motor Motor: Abnormal tone;Ataxia;Abnormal postural alignment and control;Motor apraxia Mobility  Bed Mobility Bed Mobility: Rolling Right;Rolling Left;Left Sidelying to Sit;Sit to Supine Rolling Right: 2: Max assist Rolling Right Details: Manual facilitation for placement Rolling Left: 3: Mod assist Rolling Left Details: Manual facilitation for placement Left Sidelying to Sit: 2: Max assist Left Sidelying to Sit Details: Manual facilitation for placement Sit to Supine: 2: Max assist Sit to Supine - Details: Manual facilitation for placement Transfers Sit to Stand: 3: Mod assist Sit to Stand Details: Manual facilitation for placement  Trunk/Postural Assessment  Cervical Assessment Cervical Assessment: Exceptions to [place] Cervical AROM Overall Cervical AROM: Deficits;Due to precautions Overall Cervical AROM Comments: soft c-collar Thoracic Assessment Thoracic  Assessment: Exceptions to [facility] Thoracic AROM Overall Thoracic AROM: Deficits;Due to premorid status Overall Thoracic AROM Comments: strong kyphotic posture Lumbar Assessment Lumbar Assessment: Exceptions to [facility] Lumbar AROM Overall Lumbar AROM: Deficits;Due to premorid status Overall Lumbar AROM Comments: stiff in all directions Postural Control Postural Control: Deficits on evaluation Head Control: limited by soft c-collar Righting Reactions: delayed and poor use of UEs Protective Responses: delayed and poor use of UEs Postural Limitations: premorbid kyphotic and forward head posture  Balance Balance Balance Assessed: Yes Static Sitting Balance Static Sitting - Balance Support: No upper extremity supported;Feet supported;Right upper extremity supported;Left upper extremity supported;Bilateral upper extremity supported Static Sitting - Level of Assistance: 5: Stand by assistance;3: Mod assist Static Sitting - Comment/# of Minutes: sitting EOB with and without UE support. Dynamic Sitting Balance Dynamic Sitting - Balance Support: No upper extremity supported;Feet supported;During functional activity Dynamic Sitting - Level of Assistance: 3: Mod assist;5: Stand by assistance Dynamic Sitting - Balance Activities: Reaching for objects;Reaching across midline;Trunk control activities Sitting balance - Comments: During BADL tasks Static Standing Balance Static Standing - Balance Support: During functional activity;Bilateral upper extremity supported Static Standing - Level of Assistance: 3: Mod assist Static Standing - Comment/# of Minutes: During BADL at sink Dynamic Standing Balance Dynamic Standing - Balance Support: During functional activity;Bilateral upper extremity supported;Left upper extremity supported;Right upper extremity supported Dynamic Standing - Level of Assistance: 2: Max assist Dynamic Standing - Comments: During BADL at sink Extremity/Trunk Assessment RUE  Assessment RUE Assessment: Exceptions to [facility] RUE AROM (degrees) Overall AROM Right Upper Extremity: Deficits RUE Overall AROM Comments: shoulder flexion ~100 degrees; elbow/wrist/hand grossly WFL RUE Strength RUE Overall Strength: Deficits RUE Overall Strength Comments: grossly 4/5 LUE Assessment LUE Assessment: Exceptions to WFL LUE AROM (degrees) Overall AROM Left Upper Extremity: Deficits LUE Overall AROM Comments: shoulder flexion ~100 degrees; elbow/wrist/hand grossly WFL LUE Strength LUE Overall Strength: Deficits LUE Overall Strength Comments: grossly 4/5  FIM:  FIM - Eating Eating Activity: 4: Help with managing cup/glass;4: Help with picking up utensils;5: Needs verbal cues/supervision;5: Set-up assist for apply device (including dentures);5: Set-up assist for cut food;5: Set-up assist for open containers;5: Supervision/cues;6: More than reasonable amount of time FIM - Grooming Grooming: 1: Patient completes 0 of 4 or 1 of 5 steps, or requires 2 helpers FIM - Bathing Bathing Steps Patient Completed:  (attempted to assist with bath) Bathing: 1: Total-Patient completes 0-2 of 10 parts or less than 25% FIM - Upper Body Dressing/Undressing Upper body dressing/undressing: 1: Total-Patient completed less than 25% of tasks FIM - Lower Body Dressing/Undressing Lower body dressing/undressing: 1: Total-Patient  completed less than 25% of tasks FIM - Toileting Toileting: 1: Total-Patient completed zero steps, helper did all 3 FIM - Bed/Chair Transfer Bed/Chair Transfer: 2: Supine > Sit: Max A (lifting assist/Pt. 25-49%);2: Bed > Chair or W/C: Max A (lift and lower assist) FIM - Tub/Shower Transfers Tub/shower Transfers: 0-Activity did not occur or was simulated (patient declined)   Refer to Care Plan for Long Term Goals  Recommendations for other services: None  Discharge Criteria: Patient will be discharged from OT if patient refuses treatment 3 consecutive times without medical  reason, if treatment goals not met, if there is a change in medical status, if patient makes no progress towards goals or if patient is discharged from hospital.  The above assessment, treatment plan, treatment alternatives and goals were discussed and mutually agreed upon: by patient  SHAFFER, CHRISTINA 08/29/2014, 10:18 AM

## 2014-08-29 NOTE — Progress Notes (Signed)
Results for DELANTE, KARAPETYAN (MRN [id number]) as of 08/29/2014 12:14  Ref. Range 08/29/2014 06:51 08/29/2014 07:21 08/29/2014 07:44 08/29/2014 09:42  Glucose-Capillary Latest Range: 70-99 mg/dL 51 (L) 63 (L) 96 409 (H)  CBGs less than 100 mg/dl today.(51-63-96 mg/dl)  Recommend stopping the glyburide while in the hospital if patient continues to have low blood sugars.  Current meds: Lantus 19 units daily and Novolog SENSITIVE correction scale TID. Will continue to follow while in hospital.  Smith Mince RN BSN CDE

## 2014-08-29 NOTE — Progress Notes (Addendum)
Physical Therapy Session Note  Patient Details  Name: Javier Murillo MRN: [id number] Date of Birth: 02/03/1930  Today's Date: 08/29/2014 PT Individual Time: 1130-1200 PT Individual Time Calculation (min): 30 min   Short Term Goals: Week 1:  PT Short Term Goal 1 (Week 1): Pt will roll R and L in bed req min A and verbal cues for sequencing.  PT Short Term Goal 2 (Week 1): Pt will demonstrate supine to/from sit req mod A.  PT Short Term Goal 3 (Week 1): Pt will transfer w/c to/from bed req mod A.  PT Short Term Goal 4 (Week 1): Pt will tolerate ambulation x 20' with +2 assist PT Short Term Goal 5 (Week 1): Pt will demonstrate w/c propulsion on level surface with B LEs x 100' req SBA.   Skilled Therapeutic Interventions/Progress Updates:  1:1. Pt received sitting in w/c, ready for therapy. Focus this session on functional transfers and w/c seating/positioning. Pt was sitting in an 18x16" basic w/c with bottom support provided by a pillow. Pt observed to have an extremely flexed trunk, posterior pelvic tilt and poor lateral support due to wide width of chair. Pt switched to a basic 16x16" w/c w/ addition of [name] Basic back for increased support and sitting on a [employer] cushion for improved postural support, pressure relief and sitting tolerance. Overall, pt demonstrating improved extension of posture and reporting increased comfort with changes. Pt to trial sitting tolerance to w/c changes for 1hr over lunch, pt's primary PT will then reassess. Utilized STEADY for t/f between wheelchairs with practice of t/f sit<>stand multiple time from w/c height and perched position to target B LE coordination and strength. Good tolerance overall. Pt left sitting in w/c at end of session w/ all needs in reach, quick release belt in place and son in room.   Therapy Documentation Precautions:  Precautions Precautions: Cervical;Fall Required Braces or Orthoses: Cervical Brace Cervical Brace: Soft collar (can be  removed for bathing per Dr. Riley Kill) Restrictions Weight Bearing Restrictions: No  See FIM for current functional status  Therapy/Group: Individual Therapy  Denzil Hughes 08/29/2014, 5:16 PM

## 2014-08-29 NOTE — Progress Notes (Signed)
Bilateral lower extremity venous duplex completed:  No evidence of DVT, superficial thrombosis, or Baker's cyst.   

## 2014-08-29 NOTE — Progress Notes (Addendum)
Physical Therapy Session Note  Patient Details  Name: Javier Murillo MRN: [id number] Date of Birth: 1930-11-12  Today's Date: 08/29/2014 PT Individual Time: 1300-1430 PT Individual Time Calculation (min): 90 min   Short Term Goals: Week 1:  PT Short Term Goal 1 (Week 1): Pt will roll R and L in bed req min A and verbal cues for sequencing.  PT Short Term Goal 2 (Week 1): Pt will demonstrate supine to/from sit req mod A.  PT Short Term Goal 3 (Week 1): Pt will transfer w/c to/from bed req mod A.  PT Short Term Goal 4 (Week 1): Pt will tolerate ambulation x 20' with +2 assist PT Short Term Goal 5 (Week 1): Pt will demonstrate w/c propulsion on level surface with B LEs x 100' req SBA.   Skilled Therapeutic Interventions/Progress Updates:    Therapeutic Activity: PT instructs pt in stand-pivot transfer w/c to bed to the R req min-mod A to stand and mod-max A to pivot. PT instructs pt in sit to supine transfer req max A.  PT notes pt has soiled his brief and leaked onto his pants. RN and PT change pt's brief (tot A) after doffing his soiled pants, socks, and shoes (tot A).  PT completes Motor and Sensory portion of ASIA Motor and Sensory Evaluation Form; pt scores: 93/100 Motor Score 65/112 Pin Prick Score 109/112 Light Touch Score Pt presents as an ASIA D C2 (incomplete)  W/C Management: PT instructs pt in parts management including: releasing legrests, swinging back R armrest, locking/unlocking brakes, req assist or verbal cues for all parts.   Pt demonstrates significant difficulty in sequencing during stand-pivot transfers. Pt very sleepy during ASIA sensation testing and req repeated verbal cues to stay awake. Pt became agitated easily stating "My eyes weren't closed!", when in fact they were. Pt will benefit from continued PT on IPR.   Therapy Documentation Precautions:  Precautions Precautions: Cervical;Fall Required Braces or Orthoses: Cervical Brace Cervical Brace: Soft  collar (can be removed for bathing per Dr. Riley Kill) Restrictions Weight Bearing Restrictions: No Pain: Pain Assessment Pain Assessment: 0-10 Pain Score: 1  Pain Type: Acute pain Pain Location: Neck Pain Orientation: Posterior;Upper Pain Descriptors / Indicators: Aching Pain Onset: On-going Pain Intervention(s): Rest;Repositioned Multiple Pain Sites: No  See FIM for current functional status  Therapy/Group: Individual Therapy  Jeffie Spivack M 08/29/2014, 1:10 PM

## 2014-08-29 NOTE — Evaluation (Signed)
Physical Therapy Assessment and Plan  Patient Details  Name: Javier Murillo MRN: [id number] Date of Birth: 1930-01-21  PT Diagnosis: Abnormal posture, Abnormality of gait, Ataxia, Ataxic gait, Cognitive deficits, Coordination disorder, Difficulty walking, Dizziness and giddiness, Hypertonia, Impaired cognition, Impaired sensation, Muscle weakness, Pain in neck and Quadriplegia Rehab Potential: Fair ELOS: 2-3 weeks   Today's Date: 08/29/2014 PT Individual Time: [phone] PT Individual Time Calculation (min): 60 min    Problem List:  Patient Active Problem List   Diagnosis Date Noted  . Central cord syndrome 08/28/2014  . HNP (herniated nucleus pulposus) with myelopathy, cervical 08/22/2014  . Quadriplegia and quadriparesis 08/20/2014  . Gait disorder 08/20/2014  . Weakness 07/23/2014  . Diabetes mellitus with renal complications 62/83/6629  . Diabetic neuropathy 07/23/2014  . Acute renal failure 07/23/2014  . HTN (hypertension) 07/23/2014  . Dyslipidemia 07/23/2014    Past Medical History:  Past Medical History  Diagnosis Date  . Quadriplegia and quadriparesis 08/20/2014  . Diabetic peripheral neuropathy   . Gait disorder   . Peripheral vascular disease   . Degenerative arthritis   . Lumbosacral spondylosis   . Depression   . Chronic low back pain   . Kidney stones     only once  . Hypertension   . Hyperlipidemia   . Arthritis   . Diabetes mellitus without complication    Past Surgical History:  Past Surgical History  Procedure Laterality Date  . Shoulder surgery Bilateral   . Cataract extraction w/ intraocular lens  implant, bilateral    . Bursitis Bilateral     olecranon I&D  . Lumbar spine surgery      x7  . Back surgery      x 7  . Eye surgery      bilateral cataracts  . Bone spur removed  right sholder    . Appendectomy    . Anterior cervical decomp/discectomy fusion N/A 08/22/2014    Procedure: ANTERIOR CERVICAL DECOMPRESSION/DISCECTOMY FUSION,  cervical three-four;  Surgeon: Hosie Spangle, MD;  Location: [place] [facility];  Service: Neurosurgery;  Laterality: N/A;  C3-4 anterior cervical decompression with fusion plating and bonegraft    Assessment & Plan Clinical Impression: Javier Murillo is a 80 y.o. right-handed male with history of hypertension and insulin-dependent diabetes mellitus with peripheral neuropathy as well as multiple back surgeries. Presented 08/22/2014 with recent fall 07/14/2014 and progressive weakness in the upper extremities worse in the lower extremities. He was ambulating with a single-point cane and later need of a walker and wheelchair. MRI of cervical spine revealed multilevel spondylosis and degenerative disease with disc protrusions at all levels but with C3 and C4 spondylolisthesis and disc herniation resulting in severe stenosis with myelopathy. Underwent C3-4 anterior cervical decompression and arthrodesis 08/22/2014 per Dr. Sherwood Gambler. Postoperative pain management. Maintained on Decadron protocol. Patient transferred to CIR on 08/28/2014 .   Patient currently requires total with mobility secondary to muscle weakness, decreased cardiorespiratoy endurance, abnormal tone, motor apraxia, ataxia, decreased coordination and decreased motor planning, decreased midline orientation and decreased motor planning, decreased initiation, decreased attention, decreased awareness, decreased problem solving, decreased safety awareness, decreased memory and delayed processing, dizziness and decreased sitting balance, decreased standing balance, decreased postural control and decreased balance strategies.  Prior to hospitalization, patient was modified independent  with mobility and lived with Alone in a House home.  Home access is 2 steps from carport to the kitchenStairs to enter.  Patient will benefit from skilled PT intervention to maximize safe functional  mobility, minimize fall risk and decrease caregiver burden for planned  discharge or SNF pending patient progress.  Anticipate patient will [facility] or continued inpatient rehab at SNF pending progress and family support.  at discharge.     Skilled Therapeutic Intervention PT Evaluation: Pt presents with significant deficits: good muscle strength, but motor apraxia, significant hypertonia and ataxia in B UEs/LEs, poor coordination, poor motor planning, difficulty with all functional mobility including +2 to ambulate 5 feet with RW, impaired sitting and standing balance, low activity tolerance, significant sensory deficits, and cognitive deficits in all domains (unclear how much was present at baseline). Pt will benefit from continued PT on the IPR setting to improve safety and maximize functional independence.   Therapeutic Activity: PT instructs pt in sit to stand, initially req tot A, but on second attempt req mod-max A (likely due to apraxia), max A stand-pivot transfer with legs buckling on first attempt due to apraxia, max A supine to/from sit with poor motor planning, max A rolling R/L without bedrail req simple, one step cue for sequencing.   W/C Management: PT instructs pt in w/c propulsion with B LEs req SBA on level surface when going in a straight line, but min A with turns x 75' total.    PT Evaluation Precautions/Restrictions Precautions Precautions: Cervical;Fall Required Braces or Orthoses: Cervical Brace Cervical Brace: Soft collar (can be removed for bathing per Dr. Naaman Plummer) Restrictions Weight Bearing Restrictions: No General Chart Reviewed: Yes Family/Caregiver Present: No Vital SignsTherapy Vitals Pulse Rate: 62 BP: 128/68 mmHg Patient Position (if appropriate): Sitting Pain Pain Assessment Pain Assessment: 0-10 Pain Score: 1  Pain Location: Neck Pain Orientation: Posterior;Upper Pain Descriptors / Indicators: Aching Pain Onset: On-going Pain Intervention(s): Rest Home Living/Prior Functioning Home Living Available Help at  Discharge: Family;Available PRN/intermittently Type of Home: House Home Access: Stairs to enter [employer] of Steps: 2 steps from carport to the kitchen Entrance Stairs-Rails: None;Right Home Layout: One level Additional Comments: has been living with daughter and son in law for about a week due to decline in function however was previously living alone and independent  Lives With: Alone Prior Function Level of Independence: Requires assistive device for independence  Able to Take Stairs?: Yes Vocation: Retired Comments: SPC mod I PLOF Vision/Perception     Cognition Arousal/Alertness: Awake/alert Orientation Level: Oriented X4 Attention: Focused;Sustained;Alternating Focused Attention: Appears intact Sustained Attention: Impaired Sustained Attention Impairment: Verbal basic Alternating Attention: Impaired Alternating Attention Impairment: Verbal basic;Verbal complex Memory: Impaired (baseline, per pt report) Memory Impairment: Storage deficit;Decreased recall of new information Awareness: Impaired Awareness Impairment: Anticipatory impairment;Emergent impairment Problem Solving: Impaired Problem Solving Impairment: Functional basic Executive Function: Sequencing;Decision Making Sequencing: Impaired Decision Making: Impaired Behaviors: Poor frustration tolerance Safety/Judgment: Impaired Sensation Sensation Light Touch: Impaired by gross assessment (peripheral neuropathy; pt reports numbness in B fingers) Stereognosis: Not tested Hot/Cold: Not tested Proprioception: Impaired by gross assessment (denies lying on R arm after sit to supine transfer) Additional Comments: hesitation when testing proprioception in B wrists and B ankles, but pt got 12/12 correct.  Coordination Gross Motor Movements are Fluid and Coordinated: No Fine Motor Movements are Fluid and Coordinated: Not tested Coordination and Movement Description: jerky movements Finger Nose Finger Test:  pathway deviations and slight ataxic movement, L worse than R Heel Shin Test: difficult controlling movements, bilaterally Motor  Motor Motor: Abnormal tone;Ataxia;Abnormal postural alignment and control;Motor apraxia  Mobility Bed Mobility Bed Mobility: Rolling Right;Rolling Left;Left Sidelying to Sit;Sit to Supine Rolling Right: 2: Max assist Rolling  Right Details: Manual facilitation for placement Rolling Left: 3: Mod assist Rolling Left Details: Manual facilitation for placement Left Sidelying to Sit: 2: Max assist Left Sidelying to Sit Details: Manual facilitation for placement Sit to Supine: 2: Max assist Sit to Supine - Details: Manual facilitation for placement Transfers Transfers: Yes Sit to Stand: 3: Mod assist Sit to Stand Details: Manual facilitation for placement Stand Pivot Transfers: 2: Max assist Stand Pivot Transfer Details: Manual facilitation for placement Locomotion  Ambulation Ambulation: Yes Ambulation/Gait Assistance: 1: +2 Total assist Ambulation Distance (Feet): 5 Feet Assistive device: Rolling walker Ambulation/Gait Assistance Details: Manual facilitation for weight shifting;Manual facilitation for placement;Verbal cues for technique;Verbal cues for sequencing Gait Gait: Yes Gait Pattern: Impaired Gait Pattern: Scissoring;Ataxic;Step-through pattern;Decreased trunk rotation;Trunk flexed;Narrow base of support Gait velocity: decreased Stairs / Additional Locomotion Stairs: No Architect: Yes Wheelchair Assistance: 4: Advertising account executive Details: Financial planner: Both lower extermities Wheelchair Parts Management: Needs assistance Distance: 75  Trunk/Postural Assessment  Cervical Assessment Cervical Assessment: Exceptions to [facility] Cervical AROM Overall Cervical AROM: Deficits;Due to precautions Overall Cervical AROM Comments: soft c-collar Thoracic  Assessment Thoracic Assessment: Exceptions to [facility] Thoracic AROM Overall Thoracic AROM: Deficits;Due to premorid status Overall Thoracic AROM Comments: strong kyphotic posture Lumbar Assessment Lumbar Assessment: Exceptions to [facility] Lumbar AROM Overall Lumbar AROM: Deficits;Due to premorid status Overall Lumbar AROM Comments: stiff in all directions Postural Control Postural Control: Deficits on evaluation Head Control: limited by c-collar Righting Reactions: delayed and poor use of UEs Protective Responses: delayed and poor use of UEs Postural Limitations: premorbid kyphotic and forward head posture  Balance Balance Balance Assessed: Yes Static Sitting Balance Static Sitting - Balance Support: No upper extremity supported;Feet supported Static Sitting - Level of Assistance: 5: Stand by assistance Dynamic Sitting Balance Dynamic Sitting - Balance Support: No upper extremity supported;Feet supported;During functional activity Dynamic Sitting - Level of Assistance: 5: Stand by assistance Static Standing Balance Static Standing - Balance Support: No upper extremity supported;During functional activity Static Standing - Level of Assistance: 3: Mod assist Dynamic Standing Balance Dynamic Standing - Balance Support: No upper extremity supported;During functional activity Dynamic Standing - Level of Assistance: 2: Max assist Extremity Assessment  RUE Assessment RUE Assessment: Exceptions to [facility] RUE AROM (degrees) Overall AROM Right Upper Extremity: Deficits RUE Overall AROM Comments: shoulder flexion limited moderately; elbow/wrist/hand wfl RUE Strength RUE Overall Strength: Deficits RUE Overall Strength Comments: grip 4/5, elbow flexion 4/5, elbow extension 5/5, shoulder flexion 4/5 RUE Tone RUE Tone: Modified Ashworth Modified Ashworth Scale for Grading Hypertonia RUE: Slight increase in muscle tone, manifested by a catch, followed by minimal resistance throughout the remainder  (less than half) of the ROM RUE Tone Comments: elbow flexors LUE Assessment LUE Assessment: Exceptions to WFL LUE AROM (degrees) Overall AROM Left Upper Extremity: Deficits LUE Overall AROM Comments: shoulder flexion moderately limited; elbow/wrist/hand wfl LUE Strength LUE Overall Strength: Deficits LUE Overall Strength Comments: grip 4/5, elbow flexion 4/5, elbow extension 5/5, shoulder flexion 4/5 LUE Tone LUE Tone: Modified Ashworth Modified Ashworth Scale for Grading Hypertonia LUE: Slight increase in muscle tone, manifested by a catch, followed by minimal resistance throughout the remainder (less than half) of the ROM LUE Tone Comments: elbow flexors RLE Assessment RLE Assessment: Exceptions to [facility] RLE AROM (degrees) Overall AROM Right Lower Extremity: Within functional limits for tasks assessed RLE Strength RLE Overall Strength: Deficits RLE Overall Strength Comments: hip flexion 4/5, knee extension 4/5, knee flexion 5/5, ankle DF  5/5 RLE Tone RLE Tone Comments: ataxia during knee extension LLE Assessment LLE Assessment: Exceptions to WFL LLE AROM (degrees) Overall AROM Left Lower Extremity: Within functional limits for tasks assessed LLE Strength LLE Overall Strength: Deficits LLE Overall Strength Comments: hip flexion 4/5, knee extension 4/5, knee flexion 5/5, ankle DF 4/5 LLE Tone LLE Tone Comments: ataxia during knee extension  FIM:      Refer to Care Plan for Long Term Goals  Recommendations for other services: Other: SLP evaluation  Discharge Criteria: Patient will be discharged from PT if patient refuses treatment 3 consecutive times without medical reason, if treatment goals not met, if there is a change in medical status, if patient makes no progress towards goals or if patient is discharged from hospital.  The above assessment, treatment plan, treatment alternatives and goals were discussed and mutually agreed upon: by patient  [facility] M 08/29/2014,  10:13 AM

## 2014-08-29 NOTE — Significant Event (Signed)
Hypoglycemic Event  CBG: 63  Treatment: 15 GM carbohydrate snack  Symptoms: None  Follow-up CBG: Time:0745 CBG Result:96 Possible Reasons for Event: Unknown  Comments/MD notified:dr swartz aware     Stefanie Libel  Remember to initiate Hypoglycemia Order Set & complete

## 2014-08-29 NOTE — Progress Notes (Signed)
Patient information reviewed and entered into eRehab system by Aryianna Earwood, RN, CRRN, PPS Coordinator.  Information including medical coding and functional independence measure will be reviewed and updated through discharge.     Per nursing patient was given "Data Collection Information Summary for Patients in Inpatient Rehabilitation Facilities with attached "Privacy Act Statement-Health Care Records" upon admission.  

## 2014-08-29 NOTE — Progress Notes (Signed)
[place] PHYSICAL MEDICINE & REHABILITATION     PROGRESS NOTE    Subjective/Complaints: Sugars low this morning--drinking OJ. Pain controlled. Had a reasonable night. A  review of systems has been performed and if not noted above is otherwise negative.   Objective: Vital Signs: Blood pressure 132/70, pulse 64, temperature 98.2 F (36.8 C), temperature source Oral, resp. rate 18, height 6' (1.829 m), weight 68.539 kg (151 lb 1.6 oz), SpO2 99.00%. No results found. No results found for this basename: WBC, HGB, HCT, PLT,  in the last 72 hours No results found for this basename: NA, K, CL, CO, GLUCOSE, BUN, CREATININE, CALCIUM,  in the last 72 hours CBG (last 3)   Recent Labs  08/29/14 0651 08/29/14 0721 08/29/14 0744  GLUCAP 51* 63* 96    Wt Readings from Last 3 Encounters:  08/29/14 68.539 kg (151 lb 1.6 oz)  08/22/14 71 kg (156 lb 8.4 oz)  08/22/14 71 kg (156 lb 8.4 oz)    Physical Exam:   General ADL Comments: CNA educated on proper set up to increase independence with self feeding  Cognition:  Cognition  Overall Cognitive Status: Impaired/Different from baseline  Orientation Level: Oriented to person;Oriented to time;Oriented to situation;Disoriented to place  Cognition  Arousal/Alertness: Awake/alert  Behavior During Therapy: WFL for tasks assessed/performed  Overall Cognitive Status: Impaired/Different from baseline  Physical Exam:  Blood pressure 167/86, pulse 72, temperature 98.1 F (36.7 C), temperature source Oral, resp. rate 18, height  (1.753 m), weight 71 kg (156 lb 8.4 oz), SpO2 95.00%.  Physical Exam  Constitutional: He appears well-developed.  HENT: dentition fair/poor Head: Normocephalic.  Eyes: EOM are normal.  Neck: Neck supple. No thyromegaly present.  Cardiovascular: Normal rate and regular rhythm.  Respiratory: Effort normal and breath sounds normal. No respiratory distress.  GI: Soft. Bowel sounds are normal. He exhibits no  distension.  Neurological: He is alert.  His voice remains hoarse but intelligible. He provides his name and age as well as address. Follows simple commands.  He is hard of hearing. Sensory loss to PP/LT in hands. MMT: deltoids 4/5. Bicep/triceps 4/5. Wrist 4-, Right HI 3- to 3/5. Left HI 1+ to 2. LE: HF 3+, KE 4-, ADF/APF 4/5. No resting tone. DTR's 1+.    Skin:  Surgical neck incision clean and dry  Psychiatric: He has a normal mood and affect. His behavior is normal     Assessment/Plan: 1. 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 Physiatrist is providing close team supervision and 24 hour management of active medical problems listed below. Physiatrist and rehab team continue to assess barriers to discharge/monitor patient progress toward functional and medical goals. FIM:       FIM - Toileting Toileting: 0: Activity did not occur     FIM - Bed/Chair Transfer Bed/Chair Transfer: 1: Two helpers     Comprehension Comprehension Mode: Asleep  Expression Expression Mode: Asleep  Social Interaction Social Interaction Mode: Asleep  Problem Solving Problem Solving Mode: Asleep  Memory Memory Mode: Asleep  Medical Problem List and Plan:  1. Functional deficits secondary to C3-4 myelopathy status post decompression fusion. Central cord presentation.  2. DVT Prophylaxis/Anticoagulation: SCDs. Monitor for any signs of DVT. Check vascular study today 3. Pain Management: Hydrocodone as needed. Monitor with increased mobility  4. Mood/depression: Prozac 20 mg daily. Provide emotional support and  5. Neuropsych:  This patient is capable of making decisions on his own behalf.  6. Skin/Wound Care: Routine skin checks  7. Diabetes mellitus with peripheral neuropathy. Monitor blood sugars closely while on Decadron. DiaBeta 5  mg twice a day and 2.5 mg every evening, Lantus insulin 19 units in am  -add HS snack to combat low am sugars 8. Hypertension. Tenormin 12.5 mg daily. Monitor with increased mobility   LOS (Days) 1 A FACE TO FACE EVALUATION WAS PERFORMED  Javier Murillo T 08/29/2014 8:26 AM

## 2014-08-29 NOTE — Significant Event (Signed)
Hypoglycemic Event  CBG:51   Treatment: 15 GM carbohydrate snack  Symptoms: None  Follow-up CBG: Time:0726 CBG Result:63  Possible Reasons for Event: Unknown  Comments/MD notified :dr Riley Kill notified    Stefanie Libel  Remember to initiate Hypoglycemia Order Set & complete

## 2014-08-30 ENCOUNTER — Inpatient Hospital Stay (HOSPITAL_COMMUNITY): Payer: Medicare Other

## 2014-08-30 ENCOUNTER — Inpatient Hospital Stay (HOSPITAL_COMMUNITY): Payer: Self-pay

## 2014-08-30 ENCOUNTER — Inpatient Hospital Stay (HOSPITAL_COMMUNITY): Payer: Medicare Other | Admitting: Physical Therapy

## 2014-08-30 ENCOUNTER — Inpatient Hospital Stay (HOSPITAL_COMMUNITY): Payer: Medicare Other | Admitting: *Deleted

## 2014-08-30 LAB — GLUCOSE, CAPILLARY
GLUCOSE-CAPILLARY: 342 mg/dL — AB (ref 70–99)
Glucose-Capillary: 159 mg/dL — ABNORMAL HIGH (ref 70–99)
Glucose-Capillary: 296 mg/dL — ABNORMAL HIGH (ref 70–99)

## 2014-08-30 MED ORDER — INSULIN GLARGINE 100 UNIT/ML ~~LOC~~ SOLN: 25.0000 [IU] | Freq: Every morning | SUBCUTANEOUS | Status: DC

## 2014-08-30 MED ORDER — INSULIN GLARGINE 100 UNIT/ML ~~LOC~~ SOLN
25.0000 [IU] | Freq: Every morning | SUBCUTANEOUS | Status: DC
  Administered 2014-08-30 – 2014-09-03 (×5): 25 [IU] via SUBCUTANEOUS
  Filled 2014-08-30 (×5): qty 0.25

## 2014-08-30 NOTE — Progress Notes (Addendum)
Occupational Therapy Session Note  Patient Details  Name: Javier Murillo MRN: [id number] Date of Birth: 1930/04/19  Today's Date: 08/30/2014 OT Individual Time: 4098-1191 OT Individual Time Calculation (min): 60 min    Short Term Goals:  Week 1:  OT Short Term Goal 1 (Week 1): Pt will demonstrate ability to complete self-feeding using appropriate AE with min assist to problem-solve OT Short Term Goal 2 (Week 1): Pt will bathe upper body at w/c level at sink side with mod assist OT Short Term Goal 3 (Week 1): Pt will demo ability to stand supported with steadying assist to facilitate assist with lower body dressing OT Short Term Goal 4 (Week 1): Pt will complete transfer to [facility] from w/c with mod assist  OT Short Term Goal 5 (Week 1): Pt will sustain attention to task during BADL for 10 min with min verbal cues for redirection  Skilled Therapeutic Interventions/Progress Updates: ADL-retraining with focus on orientation, attention, sequencing, problem-solving, dynamic sitting balance, transfers, self-feeding using AE and improved FMC of BUE.   Pt received supine in bed reporting urinary incontinence with condom cath detached.   OT provided max assist for bed mobility with continuous verbal cues to sequence and redirection to sustain attention to task.   Pt was pleasant and cooperative during treatment but repeated stories and anecdotes from long-term memory requiring facilitation to sequence and progress through task.   Pt oriented to person but disoriented to day/time, place and situation.    Pt demo'd poor static sitting balance while sitting at edge of bed but completed stand pivot transfer X1 with marked apraxia and LE weakness requiring max assist to maintain standing balalnce.   Pt demo'd impaired postural control d/t kyphosis with posterior pelvic tilt and right lateral lean during supported sitting in w/c while escorted to bathroom for attempted squat pivot transfer to tub bench.   Pt required  max assist to transfer from w/c to tub bench using grab bars and continuous verbal cues to sequence.   Pt was able to grasp wash cloth briefly to bathe both upper legs but was unable to use hands or sequence to progress any further without assistance.   Pt required total assist to dress at sink, standing supported against sink to allow assist to pull up brief and pants.    Pt was setup with self-feed at w/c level using built-up silverware at end of session.   Safety belt applied and call light placed within reach.       Therapy Documentation Precautions:  Precautions Precautions: Cervical;Fall Required Braces or Orthoses: Cervical Brace Cervical Brace: Soft collar Restrictions Weight Bearing Restrictions: No  Vital Signs: Therapy Vitals Temp: 98 F (36.7 C) Temp src: Oral Pulse Rate: 68 Resp: 18 BP: 131/49 mmHg Patient Position (if appropriate): Lying Oxygen Therapy SpO2: 97 % O2 Device: None (Room air)  Pain: Pain Assessment Pain Assessment: No/denies pain Pain Score: 0-No pain  See FIM for current functional status  Therapy/Group: Individual Therapy  Second session: Time: 1325-1425 Time Calculation (min):  60 min  Pain Assessment: No/denies pain  Skilled Therapeutic Interventions: ADL-retraining with focus on improved self-feeding using adapted silverware and plate guard, grooming at sink, and functional transfers.   Pt completed self-feeding lunch with mod assist to manage AE and to problem-solve.   Pt required extra time and redirection to sustain attention to self-feeding and demonstrates poor awareness of deficits with ineffective problem-solving.   Following self-feeding pt was escorted to sink to complete grooming with continuous  cues and prompts to sequence and setup to provide supplies and apply toothpaste.    During session pt was fitted with resting hand splints from ortho tech.   OT educated pt on need and use of splints; instructions for donning splints were  posted over pt's bed.  See FIM for current functional status  Therapy/Group: Individual Therapy  Javier Murillo 08/31/2014, 7:00 AM

## 2014-08-30 NOTE — Progress Notes (Signed)
Physical Therapy Session Note  Patient Details  Name: Javier Murillo MRN: [id number] Date of Birth: [birth year]  Today's Date: 08/30/2014 PT Individual Time: 0900-0930 PT Individual Time Calculation (min): 30 min   Short Term Goals: Week 1:  PT Short Term Goal 1 (Week 1): Pt will roll R and L in bed req min A and verbal cues for sequencing.  PT Short Term Goal 2 (Week 1): Pt will demonstrate supine to/from sit req mod A.  PT Short Term Goal 3 (Week 1): Pt will transfer w/c to/from bed req mod A.  PT Short Term Goal 4 (Week 1): Pt will tolerate ambulation x 20' with +2 assist PT Short Term Goal 5 (Week 1): Pt will demonstrate w/c propulsion on level surface with B LEs x 100' req SBA.   Skilled Therapeutic Interventions/Progress Updates:    W/C Management: PT instructs pt in w/c propulsion with B LEs x 150' req SBA and verbal cues to swing wide when avoiding obstacles during a turn. PT instructs pt in locking/unlcoking brakes with B UEs req verbal cues to open hands prior to locking/unlocking.   Therapeutic Activity: PT instructs pt in sit to stand with and without RW req mod A - on first attempt, pt demonstrates apraxia and does not assist in standing, but on second attempt is when pt req mod A to stand.  PT instructs pt in stand-pivot transfer w/c to/from loveseat req max A. PT instructs pt to take a step, but pt is unable to do so. Transfer to loveseat, pt uses RW; transfer from loveseat, pt uses no AD.  PT instructs pt in squat-pivot transfer w/c to bed req max A. [name] with RN present to dress skin tear on L hand, which occurred at some point during this session.   Pt missed first 30 minutes of this session due to feeding self breakfast, slowly. PT requested scheduler give pt 45 minutes-1 hour break during morning for pt to feed self breakfast for future therapy days. Pt became upset with this PT feeling as if PT was "talking down" to him, while PT was giving simple, one  step commands. PT apologized and began calling pt "sir", which seemed to make pt feel better. Once in standing, pt loses all ability to motor plan and is unable to take a step to stand-step transfer. Pt fatigues quickly during therapy session and req breaks throughout to participate during 30 minute session, today. Continue per PT POC.   Therapy Documentation Precautions:  Precautions Precautions: Cervical;Fall Required Braces or Orthoses: Cervical Brace Cervical Brace: Soft collar (can be removed for bathing per Dr. Riley Kill) Restrictions Weight Bearing Restrictions: No General: PT Amount of Missed Time (min): 30 Minutes PT Missed Treatment Reason: Other (Comment) (pt eating breakfast) Pain: Pain Assessment Pain Assessment: 0-10 Pain Score: 1  Pain Type: Acute pain Pain Location: Neck Pain Orientation: Upper;Posterior;Anterior Pain Descriptors / Indicators: Aching Pain Onset: On-going Pain Intervention(s): Rest Multiple Pain Sites: No  See FIM for current functional status  Therapy/Group: Individual Therapy  Javier Murillo M 08/30/2014, 8:34 AM

## 2014-08-30 NOTE — Progress Notes (Signed)
Inpatient Diabetes Program Recommendations  AACE/ADA: New Consensus Statement on Inpatient Glycemic Control (2013)  Target Ranges:  Prepandial:   less than 140 mg/dL      Peak postprandial:   less than 180 mg/dL (1-2 hours)      Critically ill patients:  140 - 180 mg/dL    Reason for assessment: low blood sugars  Diabetes history: Type 2 Outpatient Diabetes medications: Diabeta  qam,  q lunch and 2.5mg  qpm, Lantus 19 units daily Current orders for Inpatient glycemic control: Diabeta  qam,  q lunch and 2.5mg  qpm, Lantus 19 units daily, Novolog correction 0-9units with meals.  May want consider decreasing the Lantus; steroids are complete and patient has experienced low blood sugars while in hospital.  I have asked the RN to review the symptoms of low blood sugar with the patient.   Susette Racer, RN, BA, MHA, CDE Diabetes Coordinator Inpatient Diabetes Program  [phone] (Team Pager) [phone] [facility]) 08/30/2014 10:34 AM

## 2014-08-30 NOTE — Progress Notes (Signed)
[facility] PHYSICAL MEDICINE & REHABILITATION     PROGRESS NOTE    Subjective/Complaints: Sugars low this morning--drinking OJ. Pain controlled. Had a reasonable night. A  review of systems has been performed and if not noted above is otherwise negative.   Objective: Vital Signs: Blood pressure 130/67, pulse 63, temperature 98.1 F (36.7 C), temperature source Oral, resp. rate 18, height 6' (1.829 m), weight 71.5 kg (157 lb 10.1 oz), SpO2 97.00%. No results found.  Recent Labs  08/29/14 1100  WBC 13.3*  HGB 13.7  HCT 41.3  PLT 330    Recent Labs  08/29/14 1100  NA 137  K 5.2  CL 97  GLUCOSE 391*  BUN 35*  CREATININE 1.47*  CALCIUM 9.4   CBG (last 3)   Recent Labs  08/29/14 1659 08/29/14 2102 08/30/14 0654  GLUCAP 242* 171* 159*    Wt Readings from Last 3 Encounters:  08/30/14 71.5 kg (157 lb 10.1 oz)  08/22/14 71 kg (156 lb 8.4 oz)  08/22/14 71 kg (156 lb 8.4 oz)    Physical Exam:   General ADL Comments: CNA educated on proper set up to increase independence with self feeding  Cognition:  Cognition  Overall Cognitive Status: Impaired/Different from baseline  Orientation Level: Oriented to person;Oriented to time;Oriented to situation;Disoriented to place  Cognition  Arousal/Alertness: Awake/alert  Behavior During Therapy: WFL for tasks assessed/performed  Overall Cognitive Status: Impaired/Different from baseline  Physical Exam:  Blood pressure 167/86, pulse 72, temperature 98.1 F (36.7 C), temperature source Oral, resp. rate 18, height  (1.753 m), weight 71 kg (156 lb 8.4 oz), SpO2 95.00%.  Physical Exam  Constitutional: He appears well-developed.  HENT: dentition fair/poor Head: Normocephalic.  Eyes: EOM are normal.  Neck: Neck supple. No thyromegaly present.  Cardiovascular: Normal rate and regular rhythm.  Respiratory: Effort normal and breath sounds normal. No respiratory distress.  GI: Soft. Bowel sounds are normal. He exhibits no  distension.  Neurological: He is alert.  His voice remains hoarse but intelligible. He provides his name and age as well as address. Follows simple commands.  He is hard of hearing. Sensory loss to PP/LT in hands. MMT: deltoids 4/5. Bicep/triceps 4/5. Wrist 4-, Right HI 3- to 3/5. Left HI 1+ to 2. LE: HF 3+, KE 4-, ADF/APF 4/5. No resting tone. DTR's 1+.    Skin:  Surgical neck incision clean and dry  Psychiatric: He has a normal mood and affect. His behavior is normal     Assessment/Plan: 1. 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 Physiatrist is providing close team supervision and 24 hour management of active medical problems listed below. Physiatrist and rehab team continue to assess barriers to discharge/monitor patient progress toward functional and medical goals. FIM: FIM - Bathing Bathing Steps Patient Completed:  (attempted to assist with bath) Bathing: 1: Total-Patient completes 0-2 of 10 parts or less than 25%  FIM - Upper Body Dressing/Undressing Upper body dressing/undressing: 1: Total-Patient completed less than 25% of tasks FIM - Lower Body Dressing/Undressing Lower body dressing/undressing: 1: Total-Patient completed less than 25% of tasks  FIM - Toileting Toileting: 0: No continent bowel/bladder events this shift     FIM - Banker Devices: Arm rests;Orthosis (soft collar) Bed/Chair Transfer: 1: Mechanical lift (STEADY)  FIM - Locomotion: Wheelchair Distance: 75 Locomotion: Wheelchair: 0: Activity did not occur FIM -  Locomotion: Ambulation Locomotion: Ambulation Assistive Devices: Walker - Rolling;Orthosis (soft collar) Ambulation/Gait Assistance: 1: +2 Total assist Locomotion: Ambulation: 0: Activity did not occur  Comprehension Comprehension Mode:  Auditory Comprehension: 5-Understands basic 90% of the time/requires cueing < 10% of the time  Expression Expression Mode: Verbal Expression: 4-Expresses basic 75 - 89% of the time/requires cueing 10 - 24% of the time. Needs helper to occlude trach/needs to repeat words.  Social Interaction Social Interaction Mode: Asleep Social Interaction: 5-Interacts appropriately 90% of the time - Needs monitoring or encouragement for participation or interaction.  Problem Solving Problem Solving Mode: Asleep Problem Solving: 4-Solves basic 75 - 89% of the time/requires cueing 10 - 24% of the time  Memory Memory Mode: Asleep Memory: 4-Recognizes or recalls 75 - 89% of the time/requires cueing 10 - 24% of the time  Medical Problem List and Plan:  1. Functional deficits secondary to C3-4 myelopathy status post decompression fusion. Central cord presentation.  2. DVT Prophylaxis/Anticoagulation: SCDs. Monitor for any signs of DVT. Check vascular study today 3. Pain Management: Hydrocodone as needed. Monitor with increased mobility  4. Mood/depression: Prozac 20 mg daily. Provide emotional support and  5. Neuropsych: This patient is capable of making decisions on his own behalf.  6. Skin/Wound Care: Routine skin checks  7. Diabetes mellitus with peripheral neuropathy. Monitor blood sugars closely while on Decadron. DiaBeta 5 mg twice a day and 2.5 mg every evening, increase Lantus insulin to 25 units in am  -add HS snack to combat low am sugars 8. Hypertension. Tenormin 12.5 mg daily. Monitor with increased mobility   LOS (Days) 2 A FACE TO FACE EVALUATION WAS PERFORMED  Javier Murillo T 08/30/2014 8:18 AM

## 2014-08-30 NOTE — Plan of Care (Signed)
Problem: SCI BLADDER ELIMINATION Goal: RH STG MANAGE BLADDER WITH MEDICATION WITH ASSISTANCE STG Manage Bladder With Medication With Assistance.  Outcome: Not Progressing Patient is incontinent and uses condom catheter at night time.

## 2014-08-30 NOTE — IPOC Note (Addendum)
Overall Plan of Care [facility]) Patient Details Name: [name] MRN: [id number] DOB: 05/10/1930  Admitting Diagnosis: central cord   Hospital Problems: Active Problems:   Central cord syndrome     Functional Problem List: Nursing Bladder;Endurance;Nutrition;Pain;Safety;Sensory;Skin Integrity;Motor  PT Charity fundraiser;Behavior;Endurance;Motor;Pain;Perception;Safety;Sensory  OT Balance;Behavior;Cognition;Endurance;Motor;Sensory;Safety  SLP Cognition  TR Activity tolerance, functional mobility, balance, cognition, safety, pain       Basic ADL's: OT Eating;Grooming;Bathing;Dressing;Toileting     Advanced  ADL's: OT       Transfers: PT Bed Mobility;Bed to Chair;Car;Furniture  OT Toilet;Tub/Shower     Locomotion: PT Ambulation;Wheelchair Mobility;Stairs     Additional Impairments: OT Fuctional Use of Upper Extremity (BUEs)  SLP Social Cognition   Attention;Memory;Problem Solving;Social Interaction;Awareness  TR      Anticipated Outcomes Item Anticipated Outcome  Self Feeding Supervision/set up and AE PRN  Swallowing      Basic self-care  Min-Mod  Toileting  Mod (2/3 tasks)   Bathroom Transfers Min assist  Bowel/Bladder  Pt wll be continent of bowel and bladder with min assist  Transfers  min A  Locomotion  Supervison w/c; ambulation with therapy only  Communication     Cognition  supervision-Min A   Pain  Pain will be 4 or less on a scale of 0-10  Safety/Judgment  Patient will be free from falls/injury with min assist   Therapy Plan: PT Intensity: Minimum of 1-2 x/day ,45 to 90 minutes PT Frequency: 5 out of 7 days PT Duration Estimated Length of Stay: 2-3 weeks OT Intensity: Minimum of 1-2 x/day, 45 to 90 minutes OT Frequency: 5 out of 7 days OT Duration/Estimated Length of Stay: 2-3 weeks SLP Intensity: Minumum of 1-2 x/day, 30 to 90 minutes SLP Frequency: 5 out of 7 days SLP Duration/Estimated Length of Stay: 2- 3weeks   TR  Duration/ELOS:  2-3 weeks TR Frequency:  Min 1 time per week >20 minutes        Team Interventions: Nursing Interventions Bladder Management;Patient/Family Education;Pain Management;Skin Care/Wound Management;Discharge Planning;Disease Management/Prevention;Bowel Management  PT interventions Ambulation/gait training;Discharge planning;Functional mobility training;Psychosocial support;Therapeutic Activities;Visual/perceptual remediation/compensation;Balance/vestibular training;Disease management/prevention;Neuromuscular re-education;Skin care/wound management;Therapeutic Exercise;Wheelchair propulsion/positioning;Cognitive remediation/compensation;DME/adaptive equipment instruction;Pain management;Splinting/orthotics;UE/LE Strength taining/ROM;Community reintegration;Functional electrical stimulation;Patient/family education;Stair training;UE/LE Coordination activities  OT Interventions Balance/vestibular training;Cognitive remediation/compensation;Discharge planning;DME/adaptive equipment instruction;Neuromuscular re-education;Functional mobility training;Patient/family education;Psychosocial support;Self Care/advanced ADL retraining;UE/LE Strength taining/ROM;Therapeutic Exercise;Therapeutic Activities;UE/LE Coordination activities;Wheelchair propulsion/positioning  SLP Interventions Cueing hierarchy;Cognitive remediation/compensation;Environmental controls;Internal/external aids;Patient/family education;Therapeutic Activities;Functional tasks  TR Interventions Recreation/leisure participation, Balance/Vestibular training, functional mobility, therapeutic activities, UE/LE strength/coordination, cognitive retraining/compensation, w/c mobility, community reintegration, pt/family education, adaptive equipment instruction/use, discharge planning, psychosocial support  SW/CM Interventions Discharge Planning;Psychosocial Support;Patient/Family Education    Team Discharge Planning: Destination: PT-Skilled  Nursing Facility (SNF) (tbd pending family support and progress) ,OT- Home , SLP-Home (vs. SNF) Projected Follow-up: PT-24 hour supervision/assistance;Skilled nursing facility (tbd pending family support and pt progress), OT-  Home health OT;24 hour supervision/assistance, SLP-Home Health SLP;24 hour supervision/assistance;Skilled Nursing facility Projected Equipment Needs: PT-Wheelchair cushion (measurements);Wheelchair (measurements);To be determined, OT- Tub/shower bench;3 in 1 bedside comode, SLP-None recommended by SLP Equipment Details: PT-tbd, OT-  Patient/family involved in discharge planning: PT- Patient,  OT-Patient, SLP-Patient;Family member/caregiver  MD ELOS: 15-20 days Medical Rehab Prognosis:  Good Assessment: The patient has been admitted for CIR therapies with the diagnosis of cervical stenosis with central cord injury, s/p decompression. The team will be addressing functional mobility, strength, stamina, balance, safety, adaptive techniques and equipment, self-care, bowel and bladder mgt, patient and caregiver education, pain mgt, surgical precautions, appropriate splinting and ROM maintenance,  NMR, ego support. Goals have been set at min to mod assist for basic self-care and ADL's and min assist for transfers, supervision w/c propulsion .    Ranelle Oyster, MD, FAAPMR      See Team Conference Notes for weekly updates to the plan of care

## 2014-08-30 NOTE — Progress Notes (Signed)
Orthopedic Tech Progress Note Patient Details:  Javier Murillo [birth year] [id number] Called order in to [facility]. Patient ID: [name], male   DOB: 11-03-1930, 80 y.o.   MRN: [id number]   Lesle [name] 08/30/2014, 10:56 AM

## 2014-08-30 NOTE — Progress Notes (Signed)
Physical Therapy Session Note  Patient Details  Name: Javier Murillo MRN: [id number] Date of Birth: [birth year]  Today's Date: 08/30/2014 PT Individual Time: 1100-1200 PT Individual Time Calculation (min): 60 min   Short Term Goals: Week 1:  PT Short Term Goal 1 (Week 1): Pt will roll R and L in bed req min A and verbal cues for sequencing.  PT Short Term Goal 2 (Week 1): Pt will demonstrate supine to/from sit req mod A.  PT Short Term Goal 3 (Week 1): Pt will transfer w/c to/from bed req mod A.  PT Short Term Goal 4 (Week 1): Pt will tolerate ambulation x 20' with +2 assist PT Short Term Goal 5 (Week 1): Pt will demonstrate w/c propulsion on level surface with B LEs x 100' req SBA.   Skilled Therapeutic Interventions/Progress Updates:    Patient received semi-reclined in bed. Session focused on functional transfers, wheelchair mobility, and B LE/trunk NMR. Squat pivot transfers with maxA. Prolonged perch position with Stedy to increase weight bearing through B LEs with cognitive remediation game of Connect Four. Additional emphasis on fine motor tasks of picking up pieces during ConnectFour game Perch sitting<>standing with B UEs to pull up with minA. Prolonged standing and lateral weight shifts in standing (in [place]). Patient with poor sustained attention and initiation, requiring modA cues throughout session. Patient left sitting in wheelchair with seatbelt donned and all needs within reach.  RN notified of patient need for soft call bell.  Therapy Documentation Precautions:  Precautions Precautions: Cervical;Fall Required Braces or Orthoses: Cervical Brace Cervical Brace: Soft collar Restrictions Weight Bearing Restrictions: No Pain: Pain Assessment Pain Assessment: No/denies pain Pain Score: 0-No pain Pain Type: Acute pain Pain Location: Neck Pain Orientation: Upper;Posterior;Anterior Pain Descriptors / Indicators: Aching Pain Onset: On-going Pain Intervention(s):  Rest Multiple Pain Sites: No Locomotion : Ambulation Ambulation/Gait Assistance: Not tested (comment) Wheelchair Mobility Distance: 125   See FIM for current functional status  Therapy/Group: Individual Therapy  Chipper Herb. Arvie Villarruel, PT, DPT 08/30/2014, 12:12 PM

## 2014-08-30 NOTE — Care Management Note (Signed)
[facility]patient Rehabilitation Center Individual Statement of Services  Patient Name:  Javier Murillo  Date:  08/30/2014  Welcome to the Inpatient Rehabilitation Center.  Our goal is to provide you with an individualized program based on your diagnosis and situation, designed to meet your specific needs.  With this comprehensive rehabilitation program, you will be expected to participate in at least 3 hours of rehabilitation therapies Monday-Friday, with modified therapy programming on the weekends.  Your rehabilitation program will include the following services:  Physical Therapy (PT), Occupational Therapy (OT), Speech Therapy (ST), 24 hour per day rehabilitation nursing, Therapeutic Recreaction (TR), Neuropsychology, Case Management (Social Worker), Rehabilitation Medicine, Nutrition Services and Pharmacy Services  Weekly team conferences will be held on Tuesdays to discuss your progress.  Your Social Worker will talk with you frequently to get your input and to update you on team discussions.  Team conferences with you and your family in attendance may also be held.  Expected length of stay: 2-3 weeks  Overall anticipated outcome: minimal assistance  Depending on your progress and recovery, your program may change. Your Social Worker will coordinate services and will keep you informed of any changes. Your Social Worker's name and contact numbers are listed  below.  The following services may also be recommended but are not provided by the Inpatient Rehabilitation Center:   Driving Evaluations  Home Health Rehabiltiation Services  Outpatient Rehabilitation Services  Vocational Rehabilitation  Skilled Nursing Facility  Arrangements will be made to provide these services after discharge if needed.  Arrangements include referral to agencies that provide these services.  Your insurance has been verified to be:  [place] Your primary doctor is:  Dr. Donette Larry  Pertinent information will  be shared with your doctor and your insurance company.  Social Worker:  [place], [place] [phone] or [phone]   Information discussed with and copy given to patient by: Amada Jupiter, 08/30/2014, 3:23 PM

## 2014-08-31 ENCOUNTER — Inpatient Hospital Stay (HOSPITAL_COMMUNITY): Payer: Medicare Other

## 2014-08-31 ENCOUNTER — Inpatient Hospital Stay (HOSPITAL_COMMUNITY): Payer: Medicare Other | Admitting: Speech Pathology

## 2014-08-31 ENCOUNTER — Inpatient Hospital Stay (HOSPITAL_COMMUNITY): Payer: Medicare Other | Admitting: Physical Therapy

## 2014-08-31 DIAGNOSIS — E1149 Type 2 diabetes mellitus with other diabetic neurological complication: Secondary | ICD-10-CM

## 2014-08-31 DIAGNOSIS — E1142 Type 2 diabetes mellitus with diabetic polyneuropathy: Secondary | ICD-10-CM

## 2014-08-31 DIAGNOSIS — I10 Essential (primary) hypertension: Secondary | ICD-10-CM

## 2014-08-31 DIAGNOSIS — M4712 Other spondylosis with myelopathy, cervical region: Secondary | ICD-10-CM

## 2014-08-31 DIAGNOSIS — G8250 Quadriplegia, unspecified: Secondary | ICD-10-CM

## 2014-08-31 DIAGNOSIS — Z5189 Encounter for other specified aftercare: Secondary | ICD-10-CM

## 2014-08-31 LAB — GLUCOSE, CAPILLARY
GLUCOSE-CAPILLARY: 193 mg/dL — AB (ref 70–99)
Glucose-Capillary: 266 mg/dL — ABNORMAL HIGH (ref 70–99)
Glucose-Capillary: 194 mg/dL — ABNORMAL HIGH (ref 70–99)
Glucose-Capillary: 275 mg/dL — ABNORMAL HIGH (ref 70–99)
Glucose-Capillary: 344 mg/dL — ABNORMAL HIGH (ref 70–99)

## 2014-08-31 MED ORDER — FLUTICASONE PROPIONATE 50 MCG/ACT NA SUSP
2.0000 | Freq: Every day | NASAL | Status: DC | PRN
  Filled 2014-08-31: qty 16

## 2014-08-31 NOTE — Progress Notes (Signed)
Social Work  Social Work Assessment and Plan  Patient Details  Name: Javier Murillo MRN: [id number] Date of Birth: 22-Jul-1930  Today's Date: 08/31/2014  Problem List:  Patient Active Problem List   Diagnosis Date Noted  . Central cord syndrome 08/28/2014  . HNP (herniated nucleus pulposus) with myelopathy, cervical 08/22/2014  . Quadriplegia and quadriparesis 08/20/2014  . Gait disorder 08/20/2014  . Weakness 07/23/2014  . Diabetes mellitus with renal complications 07/23/2014  . Diabetic neuropathy 07/23/2014  . Acute renal failure 07/23/2014  . HTN (hypertension) 07/23/2014  . Dyslipidemia 07/23/2014   Past Medical History:  Past Medical History  Diagnosis Date  . Quadriplegia and quadriparesis 08/20/2014  . Diabetic peripheral neuropathy   . Gait disorder   . Peripheral vascular disease   . Degenerative arthritis   . Lumbosacral spondylosis   . Depression   . Chronic low back pain   . Kidney stones     only once  . Hypertension   . Hyperlipidemia   . Arthritis   . Diabetes mellitus without complication    Past Surgical History:  Past Surgical History  Procedure Laterality Date  . Shoulder surgery Bilateral   . Cataract extraction w/ intraocular lens  implant, bilateral    . Bursitis Bilateral     olecranon I&D  . Lumbar spine surgery      x7  . Back surgery      x 7  . Eye surgery      bilateral cataracts  . Bone spur removed  right sholder    . Appendectomy    . Anterior cervical decomp/discectomy fusion N/A 08/22/2014    Procedure: ANTERIOR CERVICAL DECOMPRESSION/DISCECTOMY FUSION, cervical three-four;  Surgeon: Hewitt Shorts, MD;  Location: [facility];  Service: Neurosurgery;  Laterality: N/A;  C3-4 anterior cervical decompression with fusion plating and bonegraft   Social History:  reports that he has quit smoking. His smoking use included Cigarettes. He has a 10 pack-year smoking history. He does not have any smokeless tobacco history on file. He  reports that he does not drink alcohol or use illicit drugs.  Family / Support Systems Marital Status: Widow/Widower How Long?: 2013 Patient Roles: Parent Children: daughter, Neomia Dear @ [phone] and son-in-law, Nicola Girt @ [phone] [facility]);  son, Mellody Dance, lives across the street from pt;  son, Gala Romney, also close by Anticipated Caregiver: daughter and son in law intermittently Ability/Limitations of Caregiver: daughter and son in law work and live in [place];  son, Mellody Dance, with his own health issues and cannot assist;  son, Gala Romney, and his wife also working Caregiver Availability: Intermittent Family Dynamics: Daughter reports that they all try to provide any support pt needs, however, limited by their work schedules and health issues.  Social History Preferred language: English Religion: Baptist Cultural Background: NA Read: Yes Write: Yes Employment Status: Retired Fish farm manager Issues: None Guardian/Conservator: None - pt's daughter and son-in-law [name] and Thayer Ohm) share HCPOA;  son-in-law is pt's durable POA   Abuse/Neglect Physical Abuse: Denies Verbal Abuse: Denies Sexual Abuse: Denies Exploitation of patient/patient's resources: Denies Self-Neglect: Denies  Emotional Status Pt's affect, behavior adn adjustment status: Pt pleasant and basically oriented to overall situation, however, his conversation is rambling and repetative.  He does not appear to be in any emotional distress.  Very calm. Does become briefly tearful when he points out that his wife's name is also Valentina Gu and reports that she passed away two years ago. Recent Psychosocial Issues: Daughter reports  that pt has been exhibiting cognitive decline over the past few years.  Notes family had to take over his bank account (and take away his checkbook) after he was "scammed" and they found he had spent several thousands of dollars on mailings requesting "support".  Reports that pt initially  very angry that this was being done (family taking over), however, has "accepted it now..." Pyschiatric History: None Substance Abuse History: None  Patient / Family Perceptions, Expectations & Goals Pt/Family understanding of illness & functional limitations: Pt unable to explain why he is in the hospital, however, does report that he was "losing strength in my hands!"  Daughter with good understanding of surgery performed and of current functional limitations.  Daughter reports that his cognition is very close to baseline. Premorbid pt/family roles/activities: Per daughter, family was checking on pt daily but that he was still driving and living alone.  She suspects he has had unwitnessed falls and wonders if he might have "hit her head".  Family had taken over managing pt's finances. Anticipated changes in roles/activities/participation: Per tx goals, anticipate pt will need 24/7 assistance at home which will need to be provided at home via family as caregivers vs private duty vs. SNF Pt/family expectations/goals: Daughter is "hopeful" he will make gains, however, she is realistic that he may require more than they can provide and may need to pursue SNF  [employer]: None Premorbid Home Care/DME Agencies: None Transportation available at discharge: yes Resource referrals recommended: Neuropsychology  Discharge Planning Living Arrangements: Alone Support Systems: Children;Other relatives;Friends/neighbors Type of Residence: Private residence Insurance Resources: Medicare (*AARP Medicare) Financial Resources: Social Security Financial Screen Referred: No Living Expenses: Own Money Management: Family Does the patient have any problems obtaining your medications?: No Home Management: pt and family Patient/Family Preliminary Plans: Pt and family would prefer that pt be able to return to his own home, however, daughter realistic that SNF may be needed.  Notes pt  will likely refuse initially. Barriers to Discharge: Self care;Family Support Social Work Anticipated Follow Up Needs: HH/OP;SNF Expected length of stay: 2-3 weeks  Clinical Impression Pleasant gentleman here following spinal surgery but exhibiting poor cognitive ability to stay on topic.  Speech very repetitive which daughter states is baseline.  Per tx notes, anticipating pt will need 24/7 assistance at home which family is unable to provide.  Likely to change d/c plan to SNF unless unexpected progress made.  Follow for support and d/c planning needs.   Kaitlynn Tramontana 08/31/2014, 11:54 AM

## 2014-08-31 NOTE — Progress Notes (Signed)
Inpatient Diabetes Program Recommendations  AACE/ADA: New Consensus Statement on Inpatient Glycemic Control (2013)  Target Ranges:  Prepandial:   less than 140 mg/dL      Peak postprandial:   less than 180 mg/dL (1-2 hours)      Critically ill patients:  140 - 180 mg/dL    Inpatient Diabetes Program Recommendations Insulin - Basal: Noted increase in basal insulin dose from 19 units to 25 units. Primariily controls the fasting glucose.  Insulin - Meal Coverage: Pt's post-prandial glucose levels are elevated (most probably due to Decadron). PLease add 3-4 units meal coverage tidwc while on Decaadron. Glyburide is not effective at controlling post-prandial glucose while on steroid therapy.  Thank you, Lenor Coffin, RN, CNS, Diabetes Coordinator [phone])

## 2014-08-31 NOTE — Progress Notes (Signed)
Physical Therapy Session Note  Patient Details  Name: Javier Murillo MRN: [id number] Date of Birth: November 17, 1930  Today's Date: 08/31/2014 PT Individual Time: 0930-1030 PT Individual Time Calculation (min): 60 min   Short Term Goals: Week 1:  PT Short Term Goal 1 (Week 1): Pt will roll R and L in bed req min A and verbal cues for sequencing.  PT Short Term Goal 2 (Week 1): Pt will demonstrate supine to/from sit req mod A.  PT Short Term Goal 3 (Week 1): Pt will transfer w/c to/from bed req mod A.  PT Short Term Goal 4 (Week 1): Pt will tolerate ambulation x 20' with +2 assist PT Short Term Goal 5 (Week 1): Pt will demonstrate w/c propulsion on level surface with B LEs x 100' req SBA.   Skilled Therapeutic Interventions/Progress Updates:    Therapeutic Activity: PT instructs pt in sit to stand transfer x 2 reps (once without RW and once with RW) req mod A each time on second attempt (pt has poor motor planning and would be tot A on first attempt). Pt req simple, one -step verbal cues for sequencing (scoot forward, tuck your feet, lean forward, open your hands, hold the walker).  PT instructs pt in stand-step transfer w/c to bed req max A for pivot, but pt does demonstrate the ability to take a step with each leg during this transfer (but poor foot placement due to ataxia).  PT instructs pt in stand-pivot transfer bed to w/c req max A for pivot.  PT instruct pt in squat-pivot transfer w/c to be req max A.  PT instructs pt in sit to/from supine transfer req mod A for B LEs.   Neuromuscular Reeducation: PT instructs pt in static standing balance x 2 reps (once without RW and once with RW) req min-max A variability, focusing on pt finding his balance point.  PT instructs pt in static sit balance edge of bed - tendency is to lean right. PT instructs pt in reaching activity crossing midline multiple times with each UE.   W/C Management: PT instructs pt in w/c propulsion with B LEs/UEs prn, req  verbal cues to participate and simple, one-step commands to open your hands, grab the wheel, push very hard (when using UEs).   Gait Training: PT instructs pt in ambulation in // bars req mod A for balance and simple, one -step cues for sequencing x 10' with w/c follow for safety. Pt demonstrates less scissoring of gait, but will leave UEs behind on // bars (L side moreso than R). Pt also begins a progressively more crouched walk with each step and req verbal cues to stand tall after every 1 or 2 steps.   Pt is progressing with functional mobility - demonstrating ability to ambulate further and for the first time taking a step during transfer, today. Pt verbalizes being unpleased with his progress, despite PT pointing out his improvements. Pt's daughter and SIL present for entire session, very interested in pt's care. PT explains that due to cognitive deficits, it will be unsafe for pt to d/c home alone. Family reports pt may be able to temporarily stay with them at d/c, but this is not a permanent placement b/c SIL is temporarily unemployed, but actively looking for work. Family may not understand the full extent of pt's cognitive deficits and its contribution to patient safety. PT conferenced with OT who agrees that pt is not safe to d/c home alone. Back up plan is SNF. Pt easily  agitated during therapy session because he does not like feeling unbalanced. Continue per PT POC.   Therapy Documentation Precautions:  Precautions Precautions: Cervical;Fall Required Braces or Orthoses: Cervical Brace Cervical Brace: Soft collar Restrictions Weight Bearing Restrictions: No Pain: Pain Assessment Pain Assessment: 0-10 Pain Score: 4  Pain Type: Acute pain Pain Location: Neck Pain Orientation: Left Pain Descriptors / Indicators: Aching Pain Onset: On-going Pain Intervention(s): Medication (See eMAR) Multiple Pain Sites: No Locomotion : Ambulation Ambulation/Gait Assistance: 3: Mod  assist Wheelchair Mobility Distance: 150    Therapy/Group: Individual Therapy  Javier Murillo M 08/31/2014, 12:47 PM

## 2014-08-31 NOTE — Progress Notes (Signed)
Occupational Therapy Session Note  Patient Details  Name: Javier Murillo MRN: [id number] Date of Birth: [birth year]  Today's Date: 08/31/2014 OT Individual Time: 4098-1191 OT Individual Time Calculation (min): 60 min    Short Term Goals: Week 1:  OT Short Term Goal 1 (Week 1): Pt will demonstrate ability to complete self-feeding using appropriate AE with min assist to problem-solve OT Short Term Goal 2 (Week 1): Pt will bathe upper body at w/c level at sink side with mod assist OT Short Term Goal 3 (Week 1): Pt will demo ability to stand supported with steadying assist to facilitate assist with lower body dressing OT Short Term Goal 4 (Week 1): Pt will complete transfer to [facility] from w/c with mod assist  OT Short Term Goal 5 (Week 1): Pt will sustain attention to task during BADL for 10 min with min verbal cues for redirection  Skilled Therapeutic Interventions/Progress Updates: ADL-retraining with emphasis on transfers, adapted bathing using AE (wash mit), static standing balance and improved attention.   Pt received supine in bed but alert and responsive given extra time to process directions.  Pt required mod assist to initiate bed mobility and transfer to w/c with manual facilitation for placement and weight-shifting from supine to side-lying.   Pt reported need for BM and was escorted to bathroom where he was productive for large BM although requiring total assist for clothing management and hygiene.   Pt progressed from toilet to tub bench with hand guidance and mod assist (lowering) to bench.   With setup assist to don wash mit, pt washed his stomach, groin, and upper legs this session with mod assist to problem-solve and apply soap to mit.   Pt required total assist to dress at sink due to poor motor planning and sequencing although standing for assist to pull up brief and pants with only steadying assist to maintain balance.   Pt setup for self-feeding at end of session.    RN made aware of  splint schedule posted over pt's bed.        Therapy Documentation Precautions:  Precautions Precautions: Cervical;Fall Required Braces or Orthoses: Cervical Brace Cervical Brace: Soft collar Restrictions Weight Bearing Restrictions: No  Pain: No/denies pain   See FIM for current functional status  Therapy/Group: Individual Therapy Second session: Time: 1300-1400 Time Calculation (min):  60 min  Pain Assessment: No/denies pain  Skilled Therapeutic Interventions: Therapeutic activities with emphasis on improved FMC of BUE.   Pt performed 10% of shaving task, seated at sink in w/c, with max verbal cues to sustain attention to task, providing redirection from reminiscence and nostalgic ruminations.   Pt was inattentive to thoroughness while shaving for 15 minutes until assisted by therapist.  Pt followed directions to lift his chin and turn his head when cued w/o hesitation.   Pt was then escorted to gym and completed seated fine motor activity (turning nuts on threaded rod) for 30+ minutes.   Pt completed spinning 2 nuts with right hand and 1 with left although with hand-over-hand guidance to more fully extend fingers with left hand.   Pt was then escorted back to his room and completed mod assist transfer from w/c to bed using bed rail and min verbal cues to initiate stand-pivot transfer this session.  See FIM for current functional status  Therapy/Group: Individual Therapy  Daphnee Preiss 08/31/2014, 8:59 AM

## 2014-08-31 NOTE — Evaluation (Signed)
Speech Language Pathology Assessment and Plan  Patient Details  Name: Javier Murillo MRN: [id number] Date of Birth: 04-07-1930  SLP Diagnosis: Cognitive Impairments  Rehab Potential: Good ELOS: 2- 3weeks     Today's Date: 08/31/2014 SLP Individual Time: 1100-1200 SLP Individual Time Calculation (min): 60 min   Problem List:  Patient Active Problem List   Diagnosis Date Noted  . Central cord syndrome 08/28/2014  . HNP (herniated nucleus pulposus) with myelopathy, cervical 08/22/2014  . Quadriplegia and quadriparesis 08/20/2014  . Gait disorder 08/20/2014  . Weakness 07/23/2014  . Diabetes mellitus with renal complications 95/63/8756  . Diabetic neuropathy 07/23/2014  . Acute renal failure 07/23/2014  . HTN (hypertension) 07/23/2014  . Dyslipidemia 07/23/2014   Past Medical History:  Past Medical History  Diagnosis Date  . Quadriplegia and quadriparesis 08/20/2014  . Diabetic peripheral neuropathy   . Gait disorder   . Peripheral vascular disease   . Degenerative arthritis   . Lumbosacral spondylosis   . Depression   . Chronic low back pain   . Kidney stones     only once  . Hypertension   . Hyperlipidemia   . Arthritis   . Diabetes mellitus without complication    Past Surgical History:  Past Surgical History  Procedure Laterality Date  . Shoulder surgery Bilateral   . Cataract extraction w/ intraocular lens  implant, bilateral    . Bursitis Bilateral     olecranon I&D  . Lumbar spine surgery      x7  . Back surgery      x 7  . Eye surgery      bilateral cataracts  . Bone spur removed  right sholder    . Appendectomy    . Anterior cervical decomp/discectomy fusion N/A 08/22/2014    Procedure: ANTERIOR CERVICAL DECOMPRESSION/DISCECTOMY FUSION, cervical three-four;  Surgeon: Hosie Spangle, MD;  Location: [place] [facility];  Service: Neurosurgery;  Laterality: N/A;  C3-4 anterior cervical decompression with fusion plating and bonegraft    Assessment / Plan  / Recommendation Clinical Impression Patient is an 80 y.o. right-handed male with history of hypertension and insulin-dependent diabetes mellitus with peripheral neuropathy as well as multiple back surgeries. Presented 08/22/2014 with recent fall 07/14/2014 and progressive weakness in the upper extremities but worse in the lower extremities. He was ambulating with a single-point cane and later needed a walker and wheelchair. MRI of cervical spine revealed multilevel spondylosis and degenerative disease with disc protrusions at all levels but with C3 and C4 spondylolisthesis and disc herniation resulting in severe stenosis with myelopathy. Underwent C3-4 anterior cervical decompression and arthrodesis 08/22/2014 per Dr. Sherwood Gambler. Postoperative pain management. Maintained on Decadron protocol. Physical and occupational therapy evaluations completed 08/23/2014 and a physical medicine rehabilitation consult was recommended. Patient admitted to CIR on 08/28/14 and a cognitive-linguistic evaluation was administered today due to reports from OT/PT of cognitive deficits. Patient was administered a limited evaluation due to fatigue and patient required cues for arousal and sustained attention throughout the session. Patient demonstrated moderate cognitive deficits impacting attention, working memory, problem solving and awareness with delayed processing and decreased frustration tolerance. Patient's son-in-law present and reports patient is verbose and "needs to finish a story" and has memory deficits at baseline, however, he also reports the patient is "mild mannered" and believes that the "agitation" is a result of decreased awareness of rehab process and overall deficits. Patient would benefit from skilled SLP intervention to maximize his cognitive-linguistic function and overall functional independence prior to  discharge.   Skilled Therapeutic Interventions          Administered a cognitive-linguistic evaluation. Please  see above for details. Educated the patient and his son-in-law in regards to the patient's current cognitive-linguistic function and goals of skilled SLP intervention. Both verbalize understanding but will need reinforcement.   SLP Assessment  Patient will need skilled [facility] during CIR admission    Recommendations  Oral Care Recommendations: Oral care BID Recommendations for Other Services: Neuropsych consult Patient destination: Home (vs. SNF) Follow up Recommendations: Home Health SLP;24 hour supervision/assistance;Skilled Nursing facility Equipment Recommended: None recommended by SLP    SLP Frequency 5 out of 7 days   SLP Treatment/Interventions Cueing hierarchy;Cognitive remediation/compensation;Environmental controls;Internal/external aids;Patient/family education;Therapeutic Activities;Functional tasks    Pain Pain Assessment Pain Assessment: 0-10 Pain Score: 0-No pain Pain Type: Acute pain Pain Location: Neck Pain Orientation: Left Pain Descriptors / Indicators: Aching Pain Intervention(s): Medication (See eMAR)  Short Term Goals: Week 1: SLP Short Term Goal 1 (Week 1): Patient will demonstrate sustained attention to a functional task for 10 minutes with Mod A multimodal cues for redirection.  SLP Short Term Goal 2 (Week 1): Patient will utilize external memory aids to recall new, daily information with Min A multimodal cues.  SLP Short Term Goal 3 (Week 1): Patient will demonstrate functional problem solving for basic and familiar tasks with Min A multimodal cues.  SLP Short Term Goal 4 (Week 1): Patient will utilize the call bell to request assistance with supervision multimodal cues.  SLP Short Term Goal 5 (Week 1): Patient will identify 1 goal of each individual therapy (PT/OT/ST) in order to minimize frustration tolerance with Mod A multimodal cues.    See FIM for current functional status Refer to Care Plan for Long Term  Goals  Recommendations for other services: Neuropsych  Discharge Criteria: Patient will be discharged from SLP if patient refuses treatment 3 consecutive times without medical reason, if treatment goals not met, if there is a change in medical status, if patient makes no progress towards goals or if patient is discharged from hospital.  The above assessment, treatment plan, treatment alternatives and goals were discussed and mutually agreed upon: by patient and by family  Dantavious Snowball 08/31/2014, 3:27 PM

## 2014-08-31 NOTE — Progress Notes (Signed)
PHYSICAL MEDICINE & REHABILITATION     PROGRESS NOTE    Subjective/Complaints: Not feeling well but denies pain, cough, anxiety, sob, etc.  A  review of systems has been performed and if not noted above is otherwise negative.   Objective: Vital Signs: Blood pressure 137/65, pulse 62, temperature 98 F (36.7 C), temperature source Oral, resp. rate 17, height 6' (1.829 m), weight 71.5 kg (157 lb 10.1 oz), SpO2 98.00%. No results found.  Recent Labs  08/29/14 1100  WBC 13.3*  HGB 13.7  HCT 41.3  PLT 330    Recent Labs  08/29/14 1100  NA 137  K 5.2  CL 97  GLUCOSE 391*  BUN 35*  CREATININE 1.47*  CALCIUM 9.4   CBG (last 3)   Recent Labs  08/30/14 1707 08/30/14 2117 08/31/14 0654  GLUCAP 296* 266* 194*    Wt Readings from Last 3 Encounters:  08/30/14 71.5 kg (157 lb 10.1 oz)  08/22/14 71 kg (156 lb 8.4 oz)  08/22/14 71 kg (156 lb 8.4 oz)    Physical Exam:   General ADL Comments: CNA educated on proper set up to increase independence with self feeding  Cognition:  Cognition  Overall Cognitive Status: Impaired/Different from baseline  Orientation Level: Oriented to person;Oriented to time;Oriented to situation;Disoriented to place  Cognition  Arousal/Alertness: Awake/alert  Behavior During Therapy: WFL for tasks assessed/performed  Overall Cognitive Status: Impaired/Different from baseline  Physical Exam:  Blood pressure 167/86, pulse 72, temperature 98.1 F (36.7 C), temperature source Oral, resp. rate 18, height  (1.753 m), weight 71 kg (156 lb 8.4 oz), SpO2 95.00%.  Physical Exam  Constitutional: He appears well-developed.  HENT: dentition fair/poor Head: Normocephalic.  Eyes: EOM are normal.  Neck: Neck supple. No thyromegaly present.  Cardiovascular: Normal rate and regular rhythm.  Respiratory: Effort normal and breath sounds normal. No respiratory distress.  GI: Soft. Bowel sounds are normal. He exhibits no distension.   Neurological: He is alert.  His voice remains hoarse but intelligible. He provides his name and age as well as address. Follows simple commands.  He is hard of hearing. Sensory loss to PP/LT in hands. MMT: deltoids 4/5. Bicep/triceps 4/5. Wrist 4-, Right HI 3- to 3/5. Left HI 2+ to 3-. LE: HF 3+, KE 4-, ADF/APF 4/5. No resting tone. DTR's 1+.    Skin:  Surgical neck incision clean and dry  Psychiatric: He has a normal mood and affect. His behavior is normal     Assessment/Plan: 1. 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 Physiatrist is providing close team supervision and 24 hour management of active medical problems listed below. Physiatrist and rehab team continue to assess barriers to discharge/monitor patient progress toward functional and medical goals.  Can use bilateral WHO's at night although he does not have resting tone and fingers remain easily moveable at this point.  FIM: FIM - Bathing Bathing Steps Patient Completed: Right upper leg;Left upper leg Bathing: 1: Total-Patient completes 0-2 of 10 parts or less than 25%  FIM - Upper Body Dressing/Undressing Upper body dressing/undressing: 1: Total-Patient completed less than 25% of tasks FIM - Lower Body Dressing/Undressing Lower body dressing/undressing: 1: Total-Patient completed less than 25% of tasks  FIM - Toileting Toileting: 0: No continent bowel/bladder events this shift     FIM - Banker Devices: Arm rests;Walker;Orthosis Bed/Chair  Transfer: 2: Bed > Chair or W/C: Max A (lift and lower assist);2: Chair or W/C > Bed: Max A (lift and lower assist)  FIM - Locomotion: Wheelchair Distance: 150 Locomotion: Wheelchair: 5: Travels 150 ft or more: maneuvers on rugs and over door sills with supervision, cueing or  coaxing FIM - Locomotion: Ambulation Locomotion: Ambulation Assistive Devices: Walker - Rolling;Orthosis (soft collar) Ambulation/Gait Assistance: Not tested (comment) Locomotion: Ambulation: 0: Activity did not occur  Comprehension Comprehension Mode: Auditory Comprehension: 4-Understands basic 75 - 89% of the time/requires cueing 10 - 24% of the time  Expression Expression Mode: Verbal Expression: 4-Expresses basic 75 - 89% of the time/requires cueing 10 - 24% of the time. Needs helper to occlude trach/needs to repeat words.  Social Interaction Social Interaction Mode: Asleep Social Interaction: 5-Interacts appropriately 90% of the time - Needs monitoring or encouragement for participation or interaction.  Problem Solving Problem Solving Mode: Asleep Problem Solving: 4-Solves basic 75 - 89% of the time/requires cueing 10 - 24% of the time  Memory Memory Mode: Asleep Memory: 4-Recognizes or recalls 75 - 89% of the time/requires cueing 10 - 24% of the time  Medical Problem List and Plan:  1. Functional deficits secondary to C3-4 myelopathy status post decompression fusion. Central cord presentation.  2. DVT Prophylaxis/Anticoagulation: SCDs. Monitor for any signs of DVT. Dopplers negative 3. Pain Management: Hydrocodone as needed. Monitor with increased mobility  4. Mood/depression: Prozac 20 mg daily. Provide emotional support and  5. Neuropsych: This patient is capable of making decisions on his own behalf.  6. Skin/Wound Care: Routine skin checks  7. Diabetes mellitus with peripheral neuropathy. Monitor blood sugars closely while on Decadron. DiaBeta 5 mg twice a day and 2.5 mg every evening, increased Lantus insulin to 25 units in am  - HS snack to combat low am sugars  -observe for pattern today 8. Hypertension. Tenormin 12.5 mg daily. Monitor with increased mobility   LOS (Days) 3 A FACE TO FACE EVALUATION WAS PERFORMED  SWARTZ,ZACHARY T 08/31/2014 7:10 AM

## 2014-09-01 ENCOUNTER — Inpatient Hospital Stay (HOSPITAL_COMMUNITY): Payer: Medicare Other | Admitting: Occupational Therapy

## 2014-09-01 ENCOUNTER — Inpatient Hospital Stay (HOSPITAL_COMMUNITY): Payer: Medicare Other | Admitting: Speech Pathology

## 2014-09-01 ENCOUNTER — Inpatient Hospital Stay (HOSPITAL_COMMUNITY): Payer: Medicare Other | Admitting: Physical Therapy

## 2014-09-01 DIAGNOSIS — I10 Essential (primary) hypertension: Secondary | ICD-10-CM

## 2014-09-01 DIAGNOSIS — G8250 Quadriplegia, unspecified: Secondary | ICD-10-CM

## 2014-09-01 DIAGNOSIS — R5383 Other fatigue: Secondary | ICD-10-CM

## 2014-09-01 DIAGNOSIS — R5381 Other malaise: Secondary | ICD-10-CM

## 2014-09-01 DIAGNOSIS — E1049 Type 1 diabetes mellitus with other diabetic neurological complication: Secondary | ICD-10-CM

## 2014-09-01 DIAGNOSIS — G8389 Other specified paralytic syndromes: Secondary | ICD-10-CM

## 2014-09-01 LAB — GLUCOSE, CAPILLARY
GLUCOSE-CAPILLARY: 93 mg/dL (ref 70–99)
GLUCOSE-CAPILLARY: 274 mg/dL — AB (ref 70–99)
GLUCOSE-CAPILLARY: 153 mg/dL — AB (ref 70–99)
Glucose-Capillary: 309 mg/dL — ABNORMAL HIGH (ref 70–99)

## 2014-09-01 NOTE — Progress Notes (Signed)
Speech Language Pathology Daily Session Note  Patient Details  Name: Javier Murillo MRN: [id number] Date of Birth: 04/23/1930  Today's Date: 09/01/2014 SLP Individual Time: 0930-1000 SLP Individual Time Calculation (min): 30 min  Short Term Goals: Week 1: SLP Short Term Goal 1 (Week 1): Patient will demonstrate sustained attention to a functional task for 10 minutes with Mod A multimodal cues for redirection.  SLP Short Term Goal 2 (Week 1): Patient will utilize external memory aids to recall new, daily information with Min A multimodal cues.  SLP Short Term Goal 3 (Week 1): Patient will demonstrate functional problem solving for basic and familiar tasks with Min A multimodal cues.  SLP Short Term Goal 4 (Week 1): Patient will utilize the call bell to request assistance with supervision multimodal cues.  SLP Short Term Goal 5 (Week 1): Patient will identify 1 goal of each individual therapy (PT/OT/ST) in order to minimize frustration tolerance with Mod A multimodal cues.    Skilled Therapeutic Interventions: Skilled ST intervention provided with focus on self-care/home management goals. Pt seen in room for ST treatment, seated upright in w/c. Slp introduced pill box organizer, provided visual example of proper use. Pt required mod-max multimodal cues to organize medications accordingly. Pt was 50% accurate with med management activity. Pt stated that he has his own system that he uses for medications at home, including a home-made wooden pill organizer. Pt easily distracted/off topic x 3 during session requiring verbal cues for redirection.    FIM:  Comprehension Comprehension Mode: Auditory Comprehension: 4-Understands basic 75 - 89% of the time/requires cueing 10 - 24% of the time Expression Expression: 4-Expresses basic 75 - 89% of the time/requires cueing 10 - 24% of the time. Needs helper to occlude trach/needs to repeat words. Social Interaction Social Interaction: 3-Interacts  appropriately 50 - 74% of the time - May be physically or verbally inappropriate. Problem Solving Problem Solving: 3-Solves basic 50 - 74% of the time/requires cueing 25 - 49% of the time Memory Memory: 3-Recognizes or recalls 50 - 74% of the time/requires cueing 25 - 49% of the time  Pain Pain Assessment Pain Assessment: No/denies pain Pain Score: 0-No pain  Therapy/Group: Individual Therapy  Shermar Friedland, Kara Pacer 09/01/2014, 12:47 PM

## 2014-09-01 NOTE — Progress Notes (Signed)
Patient ID: Javier Murillo, male   DOB: 1930-11-06, 80 y.o.   MRN: [id number]   [place] PHYSICAL MEDICINE & REHABILITATION     PROGRESS NOTE   09/01/14.  Subjective/Complaints:  80 y/o admit for CIR with functional deficits secondary to C3-4 myelopathy status post decompression fusion. Central cord presentation.  A  review of systems has been performed and if not noted above is otherwise negative. Good night; Feeding self breakfast this am  Past Medical History  Diagnosis Date  . Quadriplegia and quadriparesis 08/20/2014  . Diabetic peripheral neuropathy   . Gait disorder   . Peripheral vascular disease   . Degenerative arthritis   . Lumbosacral spondylosis   . Depression   . Chronic low back pain   . Kidney stones     only once  . Hypertension   . Hyperlipidemia   . Arthritis   . Diabetes mellitus without complication     Objective: Vital Signs: Blood pressure 124/63, pulse 56, temperature 97.5 F (36.4 C), temperature source Oral, resp. rate 18, height 6' (1.829 m), weight 71.5 kg (157 lb 10.1 oz), SpO2 95.00%. No results found.  Recent Labs  08/29/14 1100  WBC 13.3*  HGB 13.7  HCT 41.3  PLT 330    Recent Labs  08/29/14 1100  NA 137  K 5.2  CL 97  GLUCOSE 391*  BUN 35*  CREATININE 1.47*  CALCIUM 9.4   CBG (last 3)   Recent Labs  08/31/14 1618 08/31/14 2050 09/01/14 0705  GLUCAP 344* 193* 93    Wt Readings from Last 3 Encounters:  08/30/14 71.5 kg (157 lb 10.1 oz)  08/22/14 71 kg (156 lb 8.4 oz)  08/22/14 71 kg (156 lb 8.4 oz)    Intake/Output Summary (Last 24 hours) at 09/01/14 0828 Last data filed at 09/01/14 0522  Gross per 24 hour  Intake    480 ml  Output    800 ml  Net   -320 ml    Patient Vitals for the past 24 hrs:  BP Temp Temp src Pulse Resp SpO2  09/01/14 0521 124/63 mmHg 97.5 F (36.4 C) Oral 56 18 95 %  08/31/14 2055 127/56 mmHg 97.7 F (36.5 C) Oral 61 18 96 %  08/31/14 1412 118/54 mmHg 98.5 F (36.9 C) Oral 63  18 100 %  08/31/14 1048 142/64 mmHg - - 67 - -  08/31/14 0945 131/60 mmHg - - 64 - 96 %   Lab Results  Component Value Date   HGBA1C 8.8* 07/24/2014   Physical Exam:   Physical Exam:  Blood pressure 167/86, pulse 72, temperature 98.1 F (36.7 C), temperature source Oral, resp. rate 18, height  (1.753 m), weight 71 kg (156 lb 8.4 oz), SpO2 95.00%.  Physical Exam  Constitutional: He appears well-developed.  HENT: dentition fair/poor Head: Normocephalic.  Eyes: EOM are normal.  Neck: Neck supple. No thyromegaly present.  Soft Cervical collar in place Cardiovascular: Normal rate and regular rhythm.  Respiratory: Effort normal and breath sounds normal. No respiratory distress.  GI: Soft. Bowel sounds are normal. He exhibits no distension.  GU- condom cath in place Neurological: He is alert.  His voice remains hoarse but intelligible. Skin:  Surgical neck incision clean and dry  Psychiatric: He has a normal mood and affect. His behavior is normal     Medical Problem List and Plan:  1. Functional deficits secondary to C3-4 myelopathy status post decompression fusion. Central cord presentation.  2. DVT Prophylaxis/Anticoagulation:  SCDs. Monitor for any signs of DVT. Dopplers negative 3. Pain Management: Hydrocodone as needed. Monitor with increased mobility  4. Mood/depression: Prozac 20 mg daily. Provide emotional support and  5. Neuropsych: This patient is capable of making decisions on his own behalf.  6. Skin/Wound Care: Routine skin checks  7. Diabetes mellitus with peripheral neuropathy. Monitor blood sugars closely while on Decadron. DiaBeta 5 mg twice a day and 2.5 mg every evening, increased Lantus insulin to 25 units in am  - HS snack to combat low am sugars  -observe for pattern today 8. Hypertension. Tenormin 12.5 mg daily. Monitor with increased mobility   LOS (Days) 4 A FACE TO FACE EVALUATION WAS PERFORMED  Rogelia Boga 09/01/2014 8:25 AM

## 2014-09-01 NOTE — Progress Notes (Signed)
Occupational Therapy Session Note  Patient Details  Name: Javier Murillo MRN: [id number] Date of Birth: [birth year]  Today's Date: 09/01/2014 OT Individual Time: 1105-1208 and 100-135 OT Individual Time Calculation (min): 63 min and 35 min   Short Term Goals: Week 1:  OT Short Term Goal 1 (Week 1): Pt will demonstrate ability to complete self-feeding using appropriate AE with min assist to problem-solve OT Short Term Goal 2 (Week 1): Pt will bathe upper body at w/c level at sink side with mod assist OT Short Term Goal 3 (Week 1): Pt will demo ability to stand supported with steadying assist to facilitate assist with lower body dressing OT Short Term Goal 4 (Week 1): Pt will complete transfer to [facility] from w/c with mod assist  OT Short Term Goal 5 (Week 1): Pt will sustain attention to task during BADL for 10 min with min verbal cues for redirection  Skilled Therapeutic Interventions/Progress Updates:  1)  Patient resting in w/c upon arrival with significant sacral sitting.  Following repositioning, engaged in self care retraining to include sponge bath (declined shower) and dressing.  Focused session on sustained attention, functional use of BUEs during bath and dress, postural control, activity tolerance, sit><stands, standing tolerance/balance.  Condom catheter removed during bath and RN called in for skin check due to sacral pain and sore (padding falling off).  Patient wanting to tell numberous long stories and required redirection.  Patient able to sustain attention for 10 min on 2 occasions with only min cues.  Patient keeps hands in a fisted position at all times as well as when attempting to use hands.  He responds well to command of "open hand" to receive (washcloth, item of clothing towel, deoderant, etc.).  2)  Patient resting in w/c upon arrival and finishing his lunch.  Engaged in self feeding with built up foam hand on spoon and practiced several methods for picking up mug with handle  and snap on lid and bring to mouth to drink.  Patient reporting sacral pain therefore attempted to demonstrate lateral leans in w/c secondary to patient did not want to go back to bed "because I don't want to stay in bed all afternoon".  Patient found to be incontinent of urine to the point of saturation and soiled his shorts, w/c cushion cover and w/c back cover. Patient reports that he could tell when he needed to urinate however he stated, "I have a catheter in".  Reminded patient that the condom catheter was removed during bathing this morning because he only wears it at night.  Patient states that when he feels the urge to urinate, he has urgency and states that staff does not always come fast enough.  All soiled items removed and patient assisted back to bed following assist from NT to donn his pants.  Therapy Documentation Precautions:  Precautions Precautions: Cervical;Fall Required Braces or Orthoses: Cervical Brace Cervical Brace: Soft collar Restrictions Weight Bearing Restrictions: No Pain: 1) unable to rate sacral pain, "tolerable", repositioned 2) no report of pain ADL: See FIM for current functional status  Therapy/Group: Individual Therapy both sessions  Raye Wiens 09/01/2014, 3:33 PM

## 2014-09-01 NOTE — Progress Notes (Signed)
Physical Therapy Session Note  Patient Details  Name: Javier Murillo MRN: [id number] Date of Birth: 29-Apr-1930  Today's Date: 09/01/2014 PT Individual Time: 1005-1105 PT Individual Time Calculation (min): 60 min   Short Term Goals: Week 1:  PT Short Term Goal 1 (Week 1): Pt will roll R and L in bed req min A and verbal cues for sequencing.  PT Short Term Goal 2 (Week 1): Pt will demonstrate supine to/from sit req mod A.  PT Short Term Goal 3 (Week 1): Pt will transfer w/c to/from bed req mod A.  PT Short Term Goal 4 (Week 1): Pt will tolerate ambulation x 20' with +2 assist PT Short Term Goal 5 (Week 1): Pt will demonstrate w/c propulsion on level surface with B LEs x 100' req SBA.   Skilled Therapeutic Interventions/Progress Updates:    Therapeutic Activity: PT instructs pt in sit to stand transfer from w/c req armrests (no AD) req mod A, max A to pivot to bed and sit - pt immediately leans trunk forward to plant R hand on bed for stability, c/o "vertigo" (pt always calls imbalance "vertigo").  PT instructs pt in sit to supine transfer req mod A - pt close to getting legs onto bed by himself, but poor placement.  PT instructs pt in rolling L in bed without rail req verbal cues for sequencing and then L side lie to sit transfer req verbal cues for sequencing and min A for placement.  PT instructs pt in bed to w/c transfer with RW req max A for balance, specific verbal cues to stand tall, as pt's knees begin to bend and body crouches without focus on standing tall - stand-step transfer - poor walker use. Pt reports he prefers a pick-up walker as to a RW.  PT instructs pt in repeated sit to stand with RW with mirror feedback for support req min-mod A for balance - pt tends to lean to the R.   W/C Management: PT instructs pt in w/c propulsion, initially with B UE/LE prn, but pt with very slowed movement and poor initiation. PT then places pt's legs on legrests and instructs pt in B UE  propulsion req min A and verbal cues for increased L UE pushing so that chair would drive straight x 75'.   Gait Training: PT instructs pt in ambulation in // bars x 10' req mod A for balance, PT places foot between pt's feet to prevent scissoring from ataxic gait. Pt demonstrates L side inattention evidenced by leaving L arm behind on // bar. Pt req repeated verbal cues to stand tall, as when he loses focus he reverts into a crouched posture. By the end of the walk, pt begins to get agitated with PT due to frequent one-step commands since pt has impaired motor planning and execution.   Pt is beginning to make progress with PT, especially with bed mobility. Transfers continue to be difficult for him due to impaired balance and ataxia. Pt believes RW is to blame for poor transfers (and "vertigo") and is requesting to use a pick-up walker. Pt is improving with ambulation in // bars and may be ready to trial ambulation with the wall rail and a hemi-walker, as well as stairs with +2 assist. Continue per PT POC.   Therapy Documentation Precautions:  Precautions Precautions: Cervical;Fall Required Braces or Orthoses: Cervical Brace Cervical Brace: Soft collar Restrictions Weight Bearing Restrictions: No Pain: Pain Assessment Pain Assessment: No/denies pain Pain Score: 0-No pain Pain Type:  Acute pain Pain Location: Neck Pain Descriptors / Indicators: Aching Patients Stated Pain Goal: 3 Pain Intervention(s): Medication (See eMAR)  See FIM for current functional status  Therapy/Group: Individual Therapy  Adelina Collard M 09/01/2014, 10:13 AM

## 2014-09-02 ENCOUNTER — Encounter (HOSPITAL_COMMUNITY): Payer: Medicare Other

## 2014-09-02 DIAGNOSIS — S14121A Central cord syndrome at C1 level of cervical spinal cord, initial encounter: Secondary | ICD-10-CM

## 2014-09-02 DIAGNOSIS — Z5189 Encounter for other specified aftercare: Secondary | ICD-10-CM

## 2014-09-02 LAB — GLUCOSE, CAPILLARY
Glucose-Capillary: 95 mg/dL (ref 70–99)
Glucose-Capillary: 311 mg/dL — ABNORMAL HIGH (ref 70–99)
Glucose-Capillary: 290 mg/dL — ABNORMAL HIGH (ref 70–99)
Glucose-Capillary: 206 mg/dL — ABNORMAL HIGH (ref 70–99)

## 2014-09-02 NOTE — Progress Notes (Signed)
Patient ID: Javier Murillo, male   DOB: Oct 03, 1930, 81 y.o.   MRN: [id number]  Patient ID: Javier Murillo, male   DOB: 1929-12-14, 80 y.o.   MRN: [id number]   [place] PHYSICAL MEDICINE & REHABILITATION     PROGRESS NOTE   09/02/14.  Subjective/Complaints:  80 y/o admit for CIR with functional deficits secondary to C3-4 myelopathy status post decompression fusion. Central cord presentation.  A  review of systems has been performed and if not noted above is otherwise negative. Good night; Feeding self breakfast this am. No concerns or complaints  Past Medical History  Diagnosis Date  . Quadriplegia and quadriparesis 08/20/2014  . Diabetic peripheral neuropathy   . Gait disorder   . Peripheral vascular disease   . Degenerative arthritis   . Lumbosacral spondylosis   . Depression   . Chronic low back pain   . Kidney stones     only once  . Hypertension   . Hyperlipidemia   . Arthritis   . Diabetes mellitus without complication     Objective: Vital Signs: Blood pressure 128/63, pulse 60, temperature 97.8 F (36.6 C), temperature source Oral, resp. rate 16, height 6' (1.829 m), weight 71.5 kg (157 lb 10.1 oz), SpO2 97.00%. No results found. No results found for this basename: WBC, HGB, HCT, PLT,  in the last 72 hours No results found for this basename: NA, K, CL, CO, GLUCOSE, BUN, CREATININE, CALCIUM,  in the last 72 hours CBG (last 3)   Recent Labs  09/01/14 1614 09/01/14 2122 09/02/14 0651  GLUCAP 309* 153* 95    Wt Readings from Last 3 Encounters:  08/30/14 71.5 kg (157 lb 10.1 oz)  08/22/14 71 kg (156 lb 8.4 oz)  08/22/14 71 kg (156 lb 8.4 oz)    Intake/Output Summary (Last 24 hours) at 09/02/14 0915 Last data filed at 09/02/14 0530  Gross per 24 hour  Intake    480 ml  Output    500 ml  Net    -20 ml    Patient Vitals for the past 24 hrs:  BP Temp Temp src Pulse Resp SpO2  09/02/14 0528 128/63 mmHg 97.8 F (36.6 C) Oral 60 16 97 %  09/01/14 2123  160/80 mmHg 98.4 F (36.9 C) Axillary 63 16 97 %  09/01/14 1430 118/42 mmHg 98.1 F (36.7 C) Oral 65 14 100 %   Lab Results  Component Value Date   HGBA1C 8.8* 07/24/2014   Physical Exam:   Physical Exam:  Blood pressure 167/86, pulse 72, temperature 98.1 F (36.7 C), temperature source Oral, resp. rate 18, height  (1.753 m), weight 71 kg (156 lb 8.4 oz), SpO2 95.00%.  Physical Exam  Constitutional: He appears well-developed.  HENT: dentition fair/poor Head: Normocephalic.  Eyes: EOM are normal.  Neck: Neck supple. No thyromegaly present.  Soft Cervical collar in place Cardiovascular: Normal rate and regular rhythm.  Respiratory: Effort normal and breath sounds normal. No respiratory distress.  GI: Soft. Bowel sounds are normal. He exhibits no distension.  GU- condom cath in place Neurological: He is alert.  His voice remains hoarse but intelligible. Skin:  Soft cervical collar in place Psychiatric: He has a normal mood and affect. His behavior is normal     Medical Problem List and Plan:  1. Functional deficits secondary to C3-4 myelopathy status post decompression fusion. Central cord presentation.  2. DVT Prophylaxis/Anticoagulation: SCDs. Monitor for any signs of DVT. Dopplers negative 3. Pain Management: Hydrocodone as  needed. Monitor with increased mobility  4. Mood/depression: Prozac 20 mg daily. Provide emotional support and  5. Neuropsych: This patient is capable of making decisions on his own behalf.  6. Skin/Wound Care: Routine skin checks  7. Diabetes mellitus with peripheral neuropathy. Monitor blood sugars closely while on Decadron. DiaBeta 5 mg twice a day and 2.5 mg every evening, increased Lantus insulin to 25 units in am  - HS snack to combat low am sugars  -observe for pattern today 8. Hypertension. Tenormin 12.5 mg daily. Monitor with increased mobility   LOS (Days) 5 A FACE TO FACE EVALUATION WAS PERFORMED  Rogelia Boga 09/02/2014 9:15  AM

## 2014-09-02 NOTE — Progress Notes (Addendum)
Occupational Therapy Session Note  Patient Details  Name: Javier Murillo MRN: [id number] Date of Birth: 1930/04/21  Today's Date: 09/02/2014 OT Individual Time: 0900-1000 OT Individual Time Calculation (min): 60 min Pt initially declined therapy but finally agreed to engaging in activities while seated in w/c.  Encouraged patient to participate in bathing and dressing but pt continually declined.  Pt engaged in BUE and FM activities/task while seated in w/c.  Pt's conversation was tangential and pt required mod verbal cues to redirect to task.  Pt's hands remained in flexed position without continual (max) verbal cues to extend fingers and open hands.  Pt engaged in grasping clothes pins and placing them on metal dowel, removing them, and retrieving them from container.     Short Term Goals: Week 1:  OT Short Term Goal 1 (Week 1): Pt will demonstrate ability to complete self-feeding using appropriate AE with min assist to problem-solve OT Short Term Goal 2 (Week 1): Pt will bathe upper body at w/c level at sink side with mod assist OT Short Term Goal 3 (Week 1): Pt will demo ability to stand supported with steadying assist to facilitate assist with lower body dressing OT Short Term Goal 4 (Week 1): Pt will complete transfer to [facility] from w/c with mod assist  OT Short Term Goal 5 (Week 1): Pt will sustain attention to task during BADL for 10 min with min verbal cues for redirection  Skilled Therapeutic Interventions/Progress Updates:      Therapy Documentation Precautions:  Precautions Precautions: Cervical;Fall Required Braces or Orthoses: Cervical Brace Cervical Brace: Soft collar Restrictions Weight Bearing Restrictions: No   Pain:  Pt c/o his "bottom" hurt but unable to rate; repositioned and w/c cushion placed  See FIM for current functional status  Therapy/Group: Individual Therapy  Rich Brave 09/02/2014, 10:02 AM

## 2014-09-03 ENCOUNTER — Inpatient Hospital Stay (HOSPITAL_COMMUNITY): Payer: Medicare Other | Admitting: Speech Pathology

## 2014-09-03 ENCOUNTER — Inpatient Hospital Stay (HOSPITAL_COMMUNITY): Payer: Medicare Other

## 2014-09-03 ENCOUNTER — Encounter (HOSPITAL_COMMUNITY): Payer: Medicare Other

## 2014-09-03 LAB — URINE MICROSCOPIC-ADD ON

## 2014-09-03 LAB — URINALYSIS, ROUTINE W REFLEX MICROSCOPIC
Bilirubin Urine: NEGATIVE
Ketones, ur: NEGATIVE mg/dL
Leukocytes, UA: NEGATIVE
Nitrite: POSITIVE — AB
PROTEIN: NEGATIVE mg/dL
Specific Gravity, Urine: 1.029 (ref 1.005–1.030)
UROBILINOGEN UA: 0.2 mg/dL (ref 0.0–1.0)
pH: 5 (ref 5.0–8.0)

## 2014-09-03 LAB — GLUCOSE, CAPILLARY
GLUCOSE-CAPILLARY: 127 mg/dL — AB (ref 70–99)
Glucose-Capillary: 115 mg/dL — ABNORMAL HIGH (ref 70–99)
Glucose-Capillary: 236 mg/dL — ABNORMAL HIGH (ref 70–99)
Glucose-Capillary: 284 mg/dL — ABNORMAL HIGH (ref 70–99)

## 2014-09-03 MED ORDER — INSULIN GLARGINE 100 UNIT/ML ~~LOC~~ SOLN
35.0000 [IU] | Freq: Every morning | SUBCUTANEOUS | Status: DC
  Administered 2014-09-04: 35 [IU] via SUBCUTANEOUS
  Filled 2014-09-03 (×2): qty 0.35

## 2014-09-03 MED ORDER — CIPROFLOXACIN HCL 250 MG PO TABS
250.0000 mg | ORAL_TABLET | Freq: Two times a day (BID) | ORAL | Status: AC
  Administered 2014-09-03 – 2014-09-09 (×13): 250 mg via ORAL
  Filled 2014-09-03 (×14): qty 1

## 2014-09-03 NOTE — Progress Notes (Signed)
Occupational Therapy Session Note  Patient Details  Name: Javier Murillo MRN: [id number] Date of Birth: 09/21/1930  Today's Date: 09/03/2014 OT Individual Time: 0728-0828 OT Individual Time Calculation (min): 60 min    Short Term Goals: Week 1:  OT Short Term Goal 1 (Week 1): Pt will demonstrate ability to complete self-feeding using appropriate AE with min assist to problem-solve OT Short Term Goal 2 (Week 1): Pt will bathe upper body at w/c level at sink side with mod assist OT Short Term Goal 3 (Week 1): Pt will demo ability to stand supported with steadying assist to facilitate assist with lower body dressing OT Short Term Goal 4 (Week 1): Pt will complete transfer to [facility] from w/c with mod assist  OT Short Term Goal 5 (Week 1): Pt will sustain attention to task during BADL for 10 min with min verbal cues for redirection  Skilled Therapeutic Interventions/Progress Updates: ADL-retraining with emphasis on improved attention/awareness, sequencing, motor planning (initiation and termination of task), improved functional use of BUE, static/dynamic standing balance and functional mobility.   Pt received seated in his w/c finishing his breakfast using standard utensils.   Pt completed self-feeding with extra time and demo'd improved wrist extension at right wrist this session which facilitated improved grasp and manipulation of utensil.   Pt's performance (rate of self-feeding) accelerated after OT removed plates from tray and reduced distractions from environment, presenting only remaining food items to be consumed.   Pt continues to demo poor attention d/t distractions but now follows simple direct commands with improved efficiency versus delayed processing as evidenced by minimal cues required to progress through BADL.   Pt finished breakfast and ambulated to bathroom using RW approx 20' with mod assist for gait (steadying support to walker and patient).   Pt transferred to tub bench with min-mod  assist in prep for bathing with max assist to remove clothing and mod assist to bathe d/t poor posture and impaired motor planning.   After bathing, pt ambulated to chair at sink again with mod instructional cues and mod assist for gait to complete dressing with overall mod-max assist.    Pt ambulated back to w/c using RW and was advised to contact RN for assist with toileting when needed using call light.   Pt stated that he has used his late wife's standard walker at times at home.   OT offered to relay info to physical therapist.   OT applied safety belt; call light and phone placed within reach.     Therapy Documentation Precautions:  Precautions Precautions: Cervical;Fall Required Braces or Orthoses: Cervical Brace Cervical Brace: Soft collar Restrictions Weight Bearing Restrictions: No  Pain: Pain Assessment Pain Assessment: No/denies pain   See FIM for current functional status  Therapy/Group: Individual Therapy  Second session: Time: 1300-1400 Time Calculation (min):  60 min  Pain Assessment: No/denies pain  Skilled Therapeutic Interventions: ADL-retraining with focus on grooming, sit<>stand, and functional mobility using standard walker.   Pt received seated in w/c and finishing his lunch.   Pt reports learning strategy of using mashed potatoes to consolidate smaller foods (vegetables) onto his fork in order to reduce spills.   Pt is not requesting use of plate guard or incorporating method of repositioning plates and/or tray to improve efficiency as demonstrated previously d/t poor short-term memory.   After meal, pt ambulated to chair at sink with mod assist for gait and engaged in grooming, sitting and standing at sink, in order to complete oral care  and shaving.    With mod assist to maintain standing balance, pt stood at sink for 80 seconds for oral care before gradually descending to seat d/t fatigue.  Pt completed oral care seated and proceeded to shaving using electric  razor.  Pt was unable to sustain attention to shaving independently but maintained interest when assisted with shaving for improved thoroughness by therapist.   From his room, pt was escorted to gym using w/c and completed LE strengthening and senory re-ed followed by ambulation using compensatory strategy (weight on left ankle to improve proprioception).    Pt completed therapeutic activity and was received by physical therapist immediately after occupational therapy session ended.   See FIM for current functional status  Therapy/Group: Individual Therapy  Catalea Labrecque 09/03/2014, 10:12 AM

## 2014-09-03 NOTE — Progress Notes (Signed)
Physical Therapy Session Note  Patient Details  Name: Javier Murillo MRN: [id number] Date of Birth: 1930/06/16  Today's Date: 09/03/2014 PT Individual Time: 0830-0930 PT Individual Time Calculation (min): 60 min     Short Term Goals: Week 1:  PT Short Term Goal 1 (Week 1): Pt will roll R and L in bed req min A and verbal cues for sequencing.  PT Short Term Goal 2 (Week 1): Pt will demonstrate supine to/from sit req mod A.  PT Short Term Goal 3 (Week 1): Pt will transfer w/c to/from bed req mod A.  PT Short Term Goal 4 (Week 1): Pt will tolerate ambulation x 20' with +2 assist PT Short Term Goal 5 (Week 1): Pt will demonstrate w/c propulsion on level surface with B LEs x 100' req SBA.   Skilled Therapeutic Interventions/Progress Updates:    Pt received seated in w/c, agreeable to participate in therapy after mod encouragement. Pt required continual max redirection to attend to task and demonstrated agitation with therapist's redirection. Pt transported to hallway outside rehab gym despite therapist encouraging self-propulsion several times. Pt w/ x1 sit<>stand w/ ModA w/ use of hallway rail on R. Ambulated 20' w/ hallway rail on R and hemi-walker on L, then 30' w/ standard walker, both w/ ModA. Noted scissoring gait and ataxic stepping w/ BLE, intermittent buckling of R knee during stance phase on R. Pt expressed preference for standard walker, walker left in room in place of rolling walker. Pt propelled w/c 100' back to room w/ supervision and mod encouragement. Pt left seated in w/c w/ RN present.     Therapy Documentation Precautions:  Precautions Precautions: Cervical;Fall Required Braces or Orthoses: Cervical Brace Cervical Brace: Soft collar Restrictions Weight Bearing Restrictions: No General:   Vital Signs:   Pain: Pain Assessment Pain Assessment: No/denies pain Mobility:   Locomotion : Ambulation Ambulation/Gait Assistance: 3: Mod assist  Trunk/Postural Assessment  :    Balance:   Exercises:   Other Treatments:    See FIM for current functional status  Therapy/Group: Individual Therapy  Hosie Spangle Hosie Spangle, PT, DPT 09/03/2014, 12:10 PM

## 2014-09-03 NOTE — Progress Notes (Signed)
[place] PHYSICAL MEDICINE & REHABILITATION     PROGRESS NOTE    Subjective/Complaints: No new issues. Denies pain. RN states he doesn'Murillo like wearing WHO's at night. Fair appetite. A  review of systems has been performed and if not noted above is otherwise negative.   Objective: Vital Signs: Blood pressure 148/67, pulse 61, temperature 97.9 F (36.6 C), temperature source Oral, resp. rate 16, height 6' (1.829 m), weight 71.5 kg (157 lb 10.1 oz), SpO2 98.00%. No results found. No results found for this basename: WBC, HGB, HCT, PLT,  in the last 72 hours No results found for this basename: NA, K, CL, CO, GLUCOSE, BUN, CREATININE, CALCIUM,  in the last 72 hours CBG (last 3)   Recent Labs  09/02/14 1621 09/02/14 2102 09/03/14 0717  GLUCAP 290* 206* 115*    Wt Readings from Last 3 Encounters:  08/30/14 71.5 kg (157 lb 10.1 oz)  08/22/14 71 kg (156 lb 8.4 oz)  08/22/14 71 kg (156 lb 8.4 oz)    Physical Exam:   General ADL Comments: CNA educated on proper set up to increase independence with self feeding  Cognition:  Cognition  Overall Cognitive Status: Impaired/Different from baseline  Orientation Level: Oriented to person;Oriented to time;Oriented to situation;Disoriented to place  Cognition  Arousal/Alertness: Awake/alert  Behavior During Therapy: WFL for tasks assessed/performed  Overall Cognitive Status: Impaired/Different from baseline    Physical Exam:    Constitutional: He appears well-developed.  HENT: dentition fair/poor Head: Normocephalic.  Eyes: EOM are normal.  Neck: Neck supple. No thyromegaly present.  Cardiovascular: Normal rate and regular rhythm.  Respiratory: Effort normal and breath sounds normal. No respiratory distress.  GI: Soft. Bowel sounds are normal. He exhibits no distension.  Neurological: He is alert.  His voice is intelligible. He provides his name and age as well as address. Follows simple commands.  He is hard of hearing.  Sometimes slow to respond. Doesn'Murillo initiate much conversation.  Sensory loss to PP/LT in hands. MMT: deltoids 4/5. Bicep/triceps 4/5. Wrist 4-, Right HI 3- to 3/5. Left HI 2+ to 3-. LE: HF 3+, KE 4-, ADF/APF 4/5. No resting tone. DTR's 1+.    Skin:  Surgical neck incision clean and dry  Psychiatric: He has a normal mood and affect. His behavior is normal     Assessment/Plan: 1. 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 Physiatrist is providing close team supervision and 24 hour management of active medical problems listed below. Physiatrist and rehab team continue to assess barriers to discharge/monitor patient progress toward functional and medical goals.     FIM: FIM - Bathing Bathing Steps Patient Completed: Front perineal area;Abdomen Bathing: 1: Total-Patient completes 0-2 of 10 parts or less than 25%  FIM - Upper Body Dressing/Undressing Upper body dressing/undressing: 1: Total-Patient completed less than 25% of tasks FIM - Lower Body Dressing/Undressing Lower body dressing/undressing: 1: Total-Patient completed less than 25% of tasks  FIM - Toileting Toileting: 1: Total-Patient completed zero steps, helper did all 3  FIM - Diplomatic Services operational officer Devices: Grab bars Toilet Transfers: 3-To toilet/BSC: Mod A (lift or lower assist);3-From toilet/BSC: Mod A (lift or lower assist)  FIM - Bed/Chair Transfer Bed/Chair Transfer Assistive Devices: Bed rails;HOB elevated;Arm rests Bed/Chair Transfer: 3: Supine > Sit: Mod A (lifting assist/Pt. 50-74%/lift 2 legs;3: Bed > Chair or W/C: Mod A (lift or lower assist)  FIM - Locomotion: Wheelchair Distance: 75 Locomotion: Wheelchair: 2: Travels 50 - 149 ft with minimal assistance (Pt.>75%) FIM - Locomotion: Ambulation Locomotion: Ambulation Assistive  Devices: Parallel bars Ambulation/Gait Assistance: 3: Mod assist Locomotion: Ambulation: 1: Travels less than 50 ft with moderate assistance (Pt: 50 - 74%)  Comprehension Comprehension Mode: Auditory Comprehension: 3-Understands basic 50 - 74% of the time/requires cueing 25 - 50%  of the time  Expression Expression Mode: Verbal Expression: 3-Expresses basic 50 - 74% of the time/requires cueing 25 - 50% of the time. Needs to repeat parts of sentences.  Social Interaction Social Interaction Mode: Asleep Social Interaction: 3-Interacts appropriately 50 - 74% of the time - May be physically or verbally inappropriate.  Problem Solving Problem Solving Mode: Asleep Problem Solving: 3-Solves basic 50 - 74% of the time/requires cueing 25 - 49% of the time  Memory Memory Mode: Asleep Memory: 3-Recognizes or recalls 50 - 74% of the time/requires cueing 25 - 49% of the time  Medical Problem List and Plan:  1. Functional deficits secondary to C3-4 myelopathy status post decompression fusion. Central cord presentation.  2. DVT Prophylaxis/Anticoagulation: SCDs. Monitor for any signs of DVT. Dopplers negative 3. Pain Management: Hydrocodone as needed. Monitor with increased mobility  4. Mood/depression: Prozac 20 mg daily. Provide emotional support and  5. Neuropsych: This patient is capable of making decisions on his own behalf.  6. Skin/Wound Care: Routine skin checks  7. Diabetes mellitus with peripheral neuropathy. Monitor blood sugars closely while on Decadron. DiaBeta 5 mg twice a day and 2.5 mg every evening, increased Lantus insulin again today to 35 units. Consider regular insulin scheduled with meals  -now off steroids  -rx uti 8. Hypertension. Tenormin 12.5 mg daily. Monitor with increased mobility  9. Urine: +odor. Check ua and culture today   LOS (Days) 6 A FACE TO FACE EVALUATION WAS PERFORMED  Javier Murillo 09/03/2014 8:06 AM

## 2014-09-03 NOTE — Progress Notes (Signed)
Physical Therapy Session Note  Patient Details  Name: Javier Murillo MRN: [id number] Date of Birth: [birth year]  Today's Date: 09/03/2014 PT Individual Time: 1400-1430 PT Individual Time Calculation (min): 30 min  and  Today's Date: 09/03/2014 PT Co-Treatment Time: 1430 (co-tx w/ CP 1430-1505)-1450 PT Co-Treatment Time Calculation (min): 20 min  Short Term Goals: Week 1:  PT Short Term Goal 1 (Week 1): Pt will roll R and L in bed req min A and verbal cues for sequencing.  PT Short Term Goal 2 (Week 1): Pt will demonstrate supine to/from sit req mod A.  PT Short Term Goal 3 (Week 1): Pt will transfer w/c to/from bed req mod A.  PT Short Term Goal 4 (Week 1): Pt will tolerate ambulation x 20' with +2 assist PT Short Term Goal 5 (Week 1): Pt will demonstrate w/c propulsion on level surface with B LEs x 100' req SBA.   Skilled Therapeutic Interventions/Progress Updates:    Pt received handed off from OT, agreeable to participate in therapy. Noted pt much less agitated and in better spirits vs AM session. Pt seen for co-tx w/ SLP Toni Amend for second half of session for shared cognitive goals. Pt w/ SPT w/ standard walker to Nustep w/ ModA. Performed 5' w/ UE/LE and 7' w/ LE only for BLE strength/coordination. Pt ambulated 5' w/ standard walker and ModA, noted difficulty motor planning w/ turn to sit in Nustep and turn to sit on mat table despite mod verbal cues. For standing balance and LE coordination pt engaged in task of foot taps on cone x5 on LLE, for RLE pt stood and attempted to initiate but felt very off balance w/ task. Pt w/ squat pivot transfer to R w/ MaxA mat>w/c w/ max vc's for hand placement and foot placement. Pt propelled w/c back to room 100' w/ BUE and MinA for obstacle avoidance. Noted that without prompting pt said "I'll try to be nicer tomorrow morning." Pt w/ squat pivot transfer to L w/c>bed w/ MaxA. Sit>supine w/ ModA, totalA to move up in bed. Pt left supine in bed w/ all  needs within reach.  Therapy Documentation Precautions:  Precautions Precautions: Cervical;Fall Required Braces or Orthoses: Cervical Brace Cervical Brace: Soft collar Restrictions Weight Bearing Restrictions: No General:   Vital Signs: Therapy Vitals Temp: 98.3 F (36.8 C) Temp src: Oral Pulse Rate: 61 Resp: 18 BP: 107/62 mmHg Patient Position (if appropriate): Lying Oxygen Therapy SpO2: 97 % O2 Device: None (Room air) Pain: Pain Assessment Pain Assessment: No/denies pain See FIM for current functional status  Therapy/Group: Individual Therapy  Hosie Spangle Hosie Spangle, PT, DPT 09/03/2014, 4:21 PM

## 2014-09-03 NOTE — Progress Notes (Signed)
Inpatient Diabetes Program Recommendations  AACE/ADA: New Consensus Statement on Inpatient Glycemic Control (2013)  Target Ranges:  Prepandial:   less than 140 mg/dL      Peak postprandial:   less than 180 mg/dL (1-2 hours)      Critically ill patients:  140 - 180 mg/dL   Increase in basal levemir will primarily lower the fasting glucose rather than cover the po intake: Increase in basal poses a risk for fasting hypogycemia. Please either increase the correction tidwc or add meal coverage per below:  Inpatient Diabetes Program Recommendations Insulin - Basal: Noted lantus dose to be increaed by 10 units to 35 units.  Fasting glucose is well controlled!  Please continue at 25 units. Correction (SSI): Either increase the correction insulin tidwc to moderate or resistant or meal coverage per below.; Insulin - Meal Coverage: The hyiperlgycemia occurs following the meals. Please add meal coverage and not increase the basal. 3-4 units novolog would be helpful  Thank you, Lenor Coffin, RN, CNS, Diabetes Coordinator [phone])

## 2014-09-03 NOTE — Progress Notes (Signed)
Speech Language Pathology Daily Session Note  Patient Details  Name: Javier Murillo MRN: [id number] Date of Birth: 1930-01-21  Today's Date: 09/03/2014 SLP Co-Treatment Time: 1450 (Co-treat with JG 1430-1505)-1505 SLP Co-Treatment Time Calculation (min): 15 min  Short Term Goals: Week 1: SLP Short Term Goal 1 (Week 1): Patient will demonstrate sustained attention to a functional task for 10 minutes with Mod A multimodal cues for redirection.  SLP Short Term Goal 2 (Week 1): Patient will utilize external memory aids to recall new, daily information with Min A multimodal cues.  SLP Short Term Goal 3 (Week 1): Patient will demonstrate functional problem solving for basic and familiar tasks with Min A multimodal cues.  SLP Short Term Goal 4 (Week 1): Patient will utilize the call bell to request assistance with supervision multimodal cues.  SLP Short Term Goal 5 (Week 1): Patient will identify 1 goal of each individual therapy (PT/OT/ST) in order to minimize frustration tolerance with Mod A multimodal cues.    Skilled Therapeutic Interventions: Skilled co-treatment with PT focused on cognitive-linguistic, standing balance, upright tolerance and lower extremity coordination goals. SLP facilitated session by providing Min A multimodal cues for auditory comprehension of lower extremity coordination task.  Patient demonstrated appropriate social interaction and participation throughout the session but required Mod A multimodal cues for emergent awareness/sustained attention during standing balance tasks. Patient reported fatigue and was transferred back to bed at end of session by PT with all needs within reach. Continue with current plan of care.     FIM:  Comprehension Comprehension Mode: Auditory Comprehension: 4-Understands basic 75 - 89% of the time/requires cueing 10 - 24% of the time Expression Expression Mode: Verbal Expression: 4-Expresses basic 75 - 89% of the time/requires cueing 10 -  24% of the time. Needs helper to occlude trach/needs to repeat words. Social Interaction Social Interaction: 4-Interacts appropriately 75 - 89% of the time - Needs redirection for appropriate language or to initiate interaction. Problem Solving Problem Solving: 3-Solves basic 50 - 74% of the time/requires cueing 25 - 49% of the time Memory Memory: 3-Recognizes or recalls 50 - 74% of the time/requires cueing 25 - 49% of the time  Pain Pain Assessment Pain Assessment: No/denies pain  Therapy/Group: Individual Therapy  Huong Luthi 09/03/2014, 4:27 PM

## 2014-09-04 ENCOUNTER — Inpatient Hospital Stay (HOSPITAL_COMMUNITY): Payer: Medicare Other | Admitting: Physical Therapy

## 2014-09-04 ENCOUNTER — Inpatient Hospital Stay (HOSPITAL_COMMUNITY): Payer: Medicare Other

## 2014-09-04 LAB — GLUCOSE, CAPILLARY
GLUCOSE-CAPILLARY: 67 mg/dL — AB (ref 70–99)
GLUCOSE-CAPILLARY: 97 mg/dL (ref 70–99)
Glucose-Capillary: 267 mg/dL — ABNORMAL HIGH (ref 70–99)
Glucose-Capillary: 217 mg/dL — ABNORMAL HIGH (ref 70–99)
Glucose-Capillary: 157 mg/dL — ABNORMAL HIGH (ref 70–99)

## 2014-09-04 MED ORDER — INSULIN ASPART 100 UNIT/ML ~~LOC~~ SOLN
5.0000 [IU] | Freq: Two times a day (BID) | SUBCUTANEOUS | Status: DC
  Administered 2014-09-04 – 2014-09-06 (×3): 5 [IU] via SUBCUTANEOUS

## 2014-09-04 NOTE — Progress Notes (Signed)
Physical Therapy Session Note  Patient Details  Name: Javier Murillo MRN: [id number] Date of Birth: 07-15-1930  Today's Date: 09/04/2014 PT Individual Time: 0930-1045 PT Individual Time Calculation (min): 75 min   Short Term Goals: Week 1:  PT Short Term Goal 1 (Week 1): Pt will roll R and L in bed req min A and verbal cues for sequencing.  PT Short Term Goal 2 (Week 1): Pt will demonstrate supine to/from sit req mod A.  PT Short Term Goal 3 (Week 1): Pt will transfer w/c to/from bed req mod A.  PT Short Term Goal 4 (Week 1): Pt will tolerate ambulation x 20' with +2 assist PT Short Term Goal 5 (Week 1): Pt will demonstrate w/c propulsion on level surface with B LEs x 100' req SBA.   Skilled Therapeutic Interventions/Progress Updates:    Pt received seated in w/c, agreeable to participate in therapy w/ mod encouragement. Pt propelled w/c 200' to ortho gym. Therapist assisted pt w/ donning LiteGait harness. Pt walked on treadmill w/ body weight supported through Litegait harness for 3' w/ therapist providing manual facilitation at BLE (LLE>RLE) for normalized gait. Pt continues to demonstrate decreased initiation, decreased step length, increased knee flexion during stance in BLE, ataxic stepping during gait. After litegait, attempted to initiate stepping activity w/ visual cueing from mirror, but pt became very agitated with therapist. This therapist tried different approaches to encourage participation, but pt refused all of them, suspect very poor frustration tolerance. "Are you trying to make a fool of me?" "You think you're smarter than me." Pt transported back to room via w/c, left seated in w/c w/ all needs within reach.   Therapy Documentation Precautions:  Precautions Precautions: Cervical;Fall Required Braces or Orthoses: Cervical Brace Cervical Brace: Soft collar Restrictions Weight Bearing Restrictions: No General: PT Amount of Missed Time (min): 15 Minutes PT Missed  Treatment Reason: Patient unwilling to participate Vital Signs: Therapy Vitals Temp: 98 F (36.7 C) Temp src: Oral Pulse Rate: 63 Resp: 17 BP: 129/63 mmHg Patient Position (if appropriate): Sitting Oxygen Therapy SpO2: 97 % O2 Device: None (Room air) Pain: Pain Assessment Pain Assessment: No/denies pain Mobility:   Locomotion :    Trunk/Postural Assessment :    Balance:   Exercises:    See FIM for current functional status  Therapy/Group: Individual Therapy  Hosie Murillo, Javier Murillo, PT, DPT 09/04/2014, 5:15 PM

## 2014-09-04 NOTE — Progress Notes (Signed)
Speech Language Pathology Daily Session Note  Patient Details  Name: Javier Murillo MRN: [id number] Date of Birth: 29-Jun-1930  Today's Date: 09/04/2014 SLP Co-Treatment Time: 1300 (Co-treatment with PT (AH) from 1300-1400)-1330 SLP Co-Treatment Time Calculation (min): 30 min  Short Term Goals: Week 1: SLP Short Term Goal 1 (Week 1): Patient will demonstrate sustained attention to a functional task for 10 minutes with Mod A multimodal cues for redirection.  SLP Short Term Goal 2 (Week 1): Patient will utilize external memory aids to recall new, daily information with Min A multimodal cues.  SLP Short Term Goal 3 (Week 1): Patient will demonstrate functional problem solving for basic and familiar tasks with Min A multimodal cues.  SLP Short Term Goal 4 (Week 1): Patient will utilize the call bell to request assistance with supervision multimodal cues.  SLP Short Term Goal 5 (Week 1): Patient will identify 1 goal of each individual therapy (PT/OT/ST) in order to minimize frustration tolerance with Mod A multimodal cues.    Skilled Therapeutic Interventions: Skilled co-treatment session with PT focused on cognitive-linguistic goals and standing balance. Upon arrival, patient was self-feeding his lunch meal of regular textures and required Mod-Max A multimodal cues for functional problem solving and selective attention to self-feeding and his RUE in a mildly distracting environment.  Patient also participated in basic money management and problem solving task in kitchen and required Mod-Max A multimodal cues to complete task, suspect difficulty of task was exacerbated by patient attempting to maintain standing balance while trying to complete. Continue with current plan of care.    FIM:  Comprehension Comprehension Mode: Auditory Comprehension: 3-Understands basic 50 - 74% of the time/requires cueing 25 - 50%  of the time Expression Expression Mode: Verbal Expression: 3-Expresses basic 50 - 74%  of the time/requires cueing 25 - 50% of the time. Needs to repeat parts of sentences. Social Interaction Social Interaction: 3-Interacts appropriately 50 - 74% of the time - May be physically or verbally inappropriate. Problem Solving Problem Solving: 3-Solves basic 50 - 74% of the time/requires cueing 25 - 49% of the time Memory Memory: 3-Recognizes or recalls 50 - 74% of the time/requires cueing 25 - 49% of the time FIM - Eating Eating Activity: 4: Helper occasionally brings food to mouth  Pain Pain Assessment Pain Assessment: No/denies pain  Therapy/Group: Individual Therapy  Corley Kohls 09/04/2014, 3:29 PM

## 2014-09-04 NOTE — Progress Notes (Signed)
Physical Therapy Session Note  Patient Details  Name: Jule SerRobert L Wesch MRN: [id number] Date of Birth: [birth year]  Today's Date: 09/04/2014 PT Co-Treatment Time: 1330 (60 minute cotreat with SLP)-1400 PT Co-Treatment Time Calculation (min): 30 min  Short Term Goals: Week 1:  PT Short Term Goal 1 (Week 1): Pt will roll R and L in bed req min A and verbal cues for sequencing.  PT Short Term Goal 2 (Week 1): Pt will demonstrate supine to/from sit req mod A.  PT Short Term Goal 3 (Week 1): Pt will transfer w/c to/from bed req mod A.  PT Short Term Goal 4 (Week 1): Pt will tolerate ambulation x 20' with +2 assist PT Short Term Goal 5 (Week 1): Pt will demonstrate w/c propulsion on level surface with B LEs x 100' req SBA.   Skilled Therapeutic Interventions/Progress Updates:   Therapy co-treat PT/SLP with focus on functional cognitive activities during standing balance training/NMR.  While pt finishing lunch with SLP assessed pt seated posture and noted significant posterior pelvic tilt, kyphosis, forward head posture and pt noted to be initiating reaching utensils for food with head and neck with no pelvic or trunk initiation.  At end of session apple board placed under foam cushion and new Vonna Kotykjay basic back placed behind pt to provide more lumbar positioning and support and more stable base to allow to initiate reaching/movement from pelvis and trunk.   Following completion of lunch pt transported to ADL kitchen total A in w/c.  Performed NMR and cognitive activities in kitchen, see below for details. Pt returned to room and left with quick release belt, UE support and all items within reach.   Therapy Documentation Precautions:  Precautions Precautions: Cervical;Fall Required Braces or Orthoses: Cervical Brace Cervical Brace: Soft collar Restrictions Weight Bearing Restrictions: No Vital Signs: Therapy Vitals Temp: 98 F (36.7 C) Temp src: Oral Pulse Rate: 63 Resp: 17 BP: 129/63  mmHg Patient Position (if appropriate): Sitting Oxygen Therapy SpO2: 97 % O2 Device: None (Room air) Pain:  No c/o pain at rest Other Treatments: Treatments Neuromuscular Facilitation: Upper Extremity;Lower Extremity;Left;Right;Activity to increase motor control;Activity to increase timing and sequencing;Activity to increase sustained activation;Activity to increase lateral weight shifting;Activity to increase anterior-posterior weight shifting during standing first at counter with UE support on counter top with focus on use of tactile and one step verbal cues to maintain upright posture and COG over BOS to provide proximal stability to un-weight RUE to count money.  Pt required max > total A to maintain postural control and balance at times as pt has increased difficulty with alternating and divided attention and impaired proprioception.  During second stand pt maintained UE support on SW while performing cognitive selection task but continued to present with significant trunk flexion, posterior and R lateral lean with max-total A to maintain and correct postural control and balance.  Pt did respond to simple verbal cues but unable to maintain.     See FIM for current functional status  Therapy/Group: Co-Treatment  Edman CircleHall, Taylynn Easton Faucette 09/04/2014, 2:58 PM

## 2014-09-04 NOTE — Progress Notes (Signed)
[facility]     PROGRESS NOTE    Subjective/Complaints: Slow to progress. Attention waxes and wanes as does participation A  review of systems has been performed and if not noted above is otherwise negative.   Objective: Vital Signs: Blood pressure 122/63, pulse 55, temperature 98.6 F (37 C), temperature source Oral, resp. rate 19, height 6' (1.829 m), weight 71.5 kg (157 lb 10.1 oz), SpO2 98.00%. No results found. No results found for this basename: WBC, HGB, HCT, PLT,  in the last 72 hours No results found for this basename: NA, K, CL, CO, GLUCOSE, BUN, CREATININE, CALCIUM,  in the last 72 hours CBG (last 3)   Recent Labs  09/04/14 0703 09/04/14 0730 09/04/14 1143  GLUCAP 67* 97 267*    Wt Readings from Last 3 Encounters:  08/30/14 71.5 kg (157 lb 10.1 oz)  08/22/14 71 kg (156 lb 8.4 oz)  08/22/14 71 kg (156 lb 8.4 oz)    Physical Exam:   General ADL Comments: CNA educated on proper set up to increase independence with self feeding  Cognition:  Cognition  Overall Cognitive Status: Impaired/Different from baseline  Orientation Level: Oriented to person;Oriented to time;Oriented to situation;Disoriented to place  Cognition  Arousal/Alertness: Awake/alert  Behavior During Therapy: WFL for tasks assessed/performed  Overall Cognitive Status: Impaired/Different from baseline    Physical Exam:    Constitutional: He appears well-developed.  HENT: dentition fair/poor Head: Normocephalic.  Eyes: EOM are normal.  Neck: Neck supple. No thyromegaly present.  Cardiovascular: Normal rate and regular rhythm.  Respiratory: Effort normal and breath sounds normal. No respiratory distress.  GI: Soft. Bowel sounds are normal. He exhibits no distension.  Neurological: He is alert.  His voice is intelligible. He provides his name and age as well as address. Follows simple commands.  He is hard of hearing. Sometimes slow to respond. Doesn'Murillo  initiate much conversation.  Sensory loss to PP/LT in hands. MMT: deltoids 4/5. Bicep/triceps 4/5. Wrist 4-, Right HI 3- to 3/5. Left HI 2+ to 3-. LE: HF 3+, KE 4-, ADF/APF 4/5. No resting tone. DTR's 1+.    Skin:  Surgical neck incision clean and dry  Psychiatric: He has a normal mood and affect. His behavior is normal     Assessment/Plan: 1. 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 Physiatrist is providing close team supervision and 24 hour management of active medical problems listed below. Physiatrist and rehab team continue to assess barriers to discharge/monitor patient progress toward functional and medical goals.     FIM: FIM - Bathing Bathing Steps Patient Completed: Chest;Right Arm;Left Arm;Abdomen;Front perineal area;Right upper leg;Left upper leg Bathing: 1: Total-Patient completes 0-2 of 10 parts or less than 25%  FIM - Upper Body Dressing/Undressing Upper body dressing/undressing steps patient completed: Put head through opening of pull over shirt/dress Upper body dressing/undressing: 1: Total-Patient completed less than 25% of tasks FIM - Lower Body Dressing/Undressing Lower body dressing/undressing: 1: Total-Patient completed less than 25% of tasks  FIM - Toileting Toileting: 1: Total-Patient completed zero steps, helper did all 3  FIM - Diplomatic Services operational officer Devices: Best boy Transfers: 2-To toilet/BSC: Max A (lift and lower assist);2-From toilet/BSC: Max A (lift and lower assist)  FIM - Bed/Chair Transfer Bed/Chair Transfer Assistive Devices: Bed rails;HOB elevated;Arm rests Bed/Chair Transfer: 3: Supine > Sit: Mod A (lifting assist/Pt.  50-74%/lift 2 legs;3: Bed > Chair or W/C: Mod A (lift or lower assist)  FIM - Locomotion: Wheelchair Distance: 75 Locomotion:  Wheelchair: 2: Travels 50 - 149 ft with supervision, cueing or coaxing FIM - Locomotion: Ambulation Locomotion: Ambulation Assistive Devices: Parallel bars Ambulation/Gait Assistance: 3: Mod assist Locomotion: Ambulation: 1: Travels less than 50 ft with moderate assistance (Pt: 50 - 74%)  Comprehension Comprehension Mode: Auditory Comprehension: 3-Understands basic 50 - 74% of the time/requires cueing 25 - 50%  of the time  Expression Expression Mode: Verbal Expression: 3-Expresses basic 50 - 74% of the time/requires cueing 25 - 50% of the time. Needs to repeat parts of sentences.  Social Interaction Social Interaction Mode: Asleep Social Interaction: 3-Interacts appropriately 50 - 74% of the time - May be physically or verbally inappropriate.  Problem Solving Problem Solving Mode: Asleep Problem Solving: 3-Solves basic 50 - 74% of the time/requires cueing 25 - 49% of the time  Memory Memory Mode: Asleep Memory: 3-Recognizes or recalls 50 - 74% of the time/requires cueing 25 - 49% of the time  Medical Problem List and Plan:  1. Functional deficits secondary to C3-4 myelopathy status post decompression fusion. Central cord presentation.  2. DVT Prophylaxis/Anticoagulation: SCDs. Monitor for any signs of DVT. Dopplers negative 3. Pain Management: Hydrocodone as needed. Monitor with increased mobility  4. Mood/depression: Prozac 20 mg daily. Provide emotional support and  5. Neuropsych: This patient is capable of making decisions on his own behalf.  6. Skin/Wound Care: Routine skin checks  7. Diabetes mellitus with peripheral neuropathy. Monitor blood sugars closely while on Decadron. DiaBeta 5 mg twice a day and 2.5 mg every evening, increased Lantus insulin again today to 35 units. add regular insulin scheduled with meals  -now off steroids  -rx uti 8. Hypertension. Tenormin 12.5 mg daily. Monitor with increased mobility  9. Urine: +odor. UA equivocal to positive---continue  empiric cipro  -cx pending LOS (Days) 7 A FACE TO FACE EVALUATION WAS PERFORMED  Javier Murillo 09/04/2014 2:31 PM

## 2014-09-04 NOTE — Progress Notes (Signed)
Hypoglycemic Event  CBG: 67  Treatment: carbohydrate snack  Symptoms: None  Follow-up CBG: Time:0730 CBG Result:97  Possible Reasons for Event: Unknown  Comments/MD notified: Sticky note attached; AM nurse made aware    Javier Murillo A  Remember to initiate Hypoglycemia Order Set & complete

## 2014-09-04 NOTE — Progress Notes (Signed)
Occupational Therapy Session Note  Patient Details  Name: Javier Murillo MRN: [id number] Date of Birth: Apr 11, 1930  Today's Date: 09/04/2014 OT Individual Time: 0726-0826 OT Individual Time Calculation (min): 60 min    Short Term Goals: Week 1:  OT Short Term Goal 1 (Week 1): Pt will demonstrate ability to complete self-feeding using appropriate AE with min assist to problem-solve OT Short Term Goal 2 (Week 1): Pt will bathe upper body at w/c level at sink side with mod assist OT Short Term Goal 3 (Week 1): Pt will demo ability to stand supported with steadying assist to facilitate assist with lower body dressing OT Short Term Goal 4 (Week 1): Pt [facility] with mod assist  OT Short Term Goal 5 (Week 1): Pt will sustain attention to task during BADL for 10 min with min verbal cues for redirection  Skilled Therapeutic Interventions/Progress Updates: ADL-retraining with emphasis on transfers, functional mobility using RW, self-feeding using AE, and cognitive remediation.   Pt received supine in bed awaiting therapist for bathing and dressing.  Pt demo'd overall decline in function during this session and required extensive assist to ambulate to bathroom using standard walker and complete B & D d/t confusion, delayed processing, and incoordination of feet and hands.   Pt required total assist for all tasks (bathing and dressing) but was able to execute single command for movements (ie, "raise your arm", "open your hand", "grab here", etc).  Pt demo's overall passive engagement with B & D and grooming however sustains attention during self-feeding with setup assist although with limited efficiency (spills) by impaired grasp.   Pt accepts use of foam grip over utensil but requires cues and extra time to problem-solve while self-feeding with poor carry-over from day-to-day.   Pt left in w/c, self-feeding after setup to open containers, with call light and phone placed within  reach.    Therapy Documentation Precautions:  Precautions Precautions: Cervical;Fall Required Braces or Orthoses: Cervical Brace Cervical Brace: Soft collar Restrictions Weight Bearing Restrictions: No  Vital Signs: Therapy Vitals Temp: 98.6 F (37 C) Temp src: Oral Pulse Rate: 55 Resp: 19 BP: 122/63 mmHg Patient Position (if appropriate): Lying Oxygen Therapy SpO2: 98 % O2 Device: None (Room air)  Pain: Pain Assessment Pain Assessment: 0-10 Pain Score: 3  Pain Type: Acute pain Pain Location: Neck Pain Descriptors / Indicators: Other (Comment) (stinging) Pain Frequency: Intermittent Pain Onset: Gradual Pain Intervention(s): Medication (See eMAR)  See FIM for current functional status  Therapy/Group: Individual Therapy  Inayah Woodin 09/04/2014, 8:27 AM

## 2014-09-04 NOTE — Progress Notes (Signed)
Physical Therapy Session Note  Patient Details  Name: Javier Murillo MRN: [id number] Date of Birth: 13-Dec-1929  Today's Date: 09/04/2014 PT Individual Time: 1533-1606 PT Individual Time Calculation (min): 33 min   Short Term Goals: Week 1:  PT Short Term Goal 1 (Week 1): Pt will roll R and L in bed req min A and verbal cues for sequencing.  PT Short Term Goal 2 (Week 1): Pt will demonstrate supine to/from sit req mod A.  PT Short Term Goal 3 (Week 1): Pt will transfer w/c to/from bed req mod A.  PT Short Term Goal 4 (Week 1): Pt will tolerate ambulation x 20' with +2 assist PT Short Term Goal 5 (Week 1): Pt will demonstrate w/c propulsion on level surface with B LEs x 100' req SBA.   Skilled Therapeutic Interventions/Progress Updates:    Pt received seated in w/c with quick release belt on; agreeable to therapy with education, min coaxing. Session focused on w/c mobility, functional transfers. Pt performed w/c mobility x30' with bilat LE's and min A, frequent verbal cueing for sustained attention to task. Performed blocked practice of multiple sit<>stand transfers from w/c with LUE support at hallway hand rail requiring mod A, manual facilitation of anterior weight shift; majority of assist required to achieve bilat hip extension. Transitioned to ambulation x10' in controlled environment with LUE support at hallway hand rail, therapist positioned under LUE providing mod A for stability and upright posture (+2A for w/c follow), 1 lb cuff weights at bilat ankles for increased proprioceptive input. Gait trial ended due to pt fatigue. Following rest break, pt performed w/c mobility x50' in controlled environment with bilat UE's/LE's with min A, max cueing for sustained attention to task, to locate pt room. Session ended in pt room, where pt was left seated in w/c with quick release belt on for safety, soft call bell attached to shirt, and all needs within reach.  Therapy Documentation Precautions:   Precautions Precautions: Cervical;Fall Required Braces or Orthoses: Cervical Brace Cervical Brace: Soft collar Restrictions Weight Bearing Restrictions: No Vital Signs: Therapy Vitals Temp: 98 F (36.7 C) Temp src: Oral Pulse Rate: 63 Resp: 17 BP: 129/63 mmHg Patient Position (if appropriate): Sitting Oxygen Therapy SpO2: 97 % O2 Device: None (Room air) Pain: Pain Assessment Pain Assessment: No/denies pain Locomotion : Ambulation Ambulation/Gait Assistance: 3: Mod assist;1: +2 Total assist;Other (comment) (+2A for w/c follow) Wheelchair Mobility Distance: 1x30', 1x50'    See FIM for current functional status  Therapy/Group: Individual Therapy  Alem Fahl, Lorenda IshiharaBlair A 09/04/2014, 5:39 PM

## 2014-09-04 NOTE — Consult Note (Signed)
NEUROBEHAVIORAL STATUS EXAM - [facility]   MEDICAL NECESSITY:  Mr. Rmani Kellogg was seen on the [facility] Unit for a neurobehavioral status exam owing to the patient's diagnosis of central cord syndrome, and to assist in treatment planning during admission.   According to medical records, Mr. Borsuk was admitted to the rehab unit owing to "Functional deficits secondary to C3-4 myelopathy s/p decompression/fusion with central cord presentation." He has a history of hypertension and insulin-dependent diabetes mellitus with peripheral neuropathy as well as multiple back surgeries. He reportedly presented on 08/22/2014 with a recent fall 07/14/2014 and progressive weakness in the upper extremities worse in the lower extremities. A MRI scan of the cervical spine revealed multilevel spondylosis and degenerative disease with disc protrusions at all levels, but with C3 and C4 spondylolisthesis and disc herniation resulting in severe stenosis with myelopathy. He underwent C3-4 anterior cervical decompression and arthrodesis 08/22/2014   During today's visit, Mr. Villada denied suffering from any major cognitive difficulties, though he mentioned that recent memories are harder to recall than remote ones. He had a very hard time explaining himself and he seemed not to understand certain questions asked of him. He reported that the neck injury he sustained was "frightening." It is unclear whether he has a history of mental health treatment, though records indicate he takes Prozac. No adjustment issues endorsed. Cognitive impairment may be a barrier to therapy. Suicidal/homicidal ideation, plan or intent was denied. No manic or hypomanic episodes were reported. The patient denied ever experiencing any auditory/visual hallucinations. No major behavioral or personality changes were endorsed.  Patient is "unsure" whether he is making progress in  therapy and it is purportedly "hard to say" whether he has enjoyed the rehab staff. When asked about social support, he only stated that he has 3 children and 4 grandchildren.   Of note, social work indicated that the patient's family is concerned about a pre-existing undiagnosed dementia. Mr. Holzheimer is presently living alone. There have reportedly been instances when he was scammed out of money by [employer]. As such, his family has taken over financial management.   PROCEDURES ADMINISTERED: [2 units [id number] on 09/03/14] Diagnostic clinical interview  Review of available records Montreal Cognitive Assessment (MoCA)  Of note, given certain physical disabilities, an abbreviated version of this test was administered.   MENTAL STATUS: Mr. Tuohy mental status exam score of 12/25 is well below the cutoff used to indicate severe cognitive impairment or dementia. He was oriented to person, place (except city), time, and situation. However, I believe he was able to see a calendar in his room to assist in on orientation to time. Naming was 2/3. Verbal learning was profoundly impaired. Simple attention was intact, though more complex working memory was impaired. Phonemic fluency was also impaired, and delayed memory was severely impaired with only minimal benefit noted via recognition cues.   Behavioral Evaluation: Mr. See was appropriately dressed for season and situation, and he appeared tidy and well-groomed. Normal posture was noted. He was quiet and did not spontaneously generate a lot of conversation. His speech was as expected, though he had trouble finding words at times. He did not always seem to understand test directions and many instructions needed to be reread. His affect was blunted. Attention and motivation were adequate. Optimal test taking conditions were maintained.   IMPRESSION: Mr. Ratz appears to be experiencing a great deal of cognitive impairment; at the level of  dementia. He does not appear  to be treated for memory loss in any way. He denies any major issues with mood, but he had a hard time understanding the topic when we were discussing it. Rehab staff should be cognizant that the patient seems to be suffering from a condition such as Alzheimer's disease, as his present medical circumstances are unlikely to cause the nature and severity of the cognitive impairment noted. These issues could also be a barrier to therapy. At this time, consider treatment with nootropic medication and follow-up after discharge with a provider knowledgeable in dementia. Additional recommendations discussed below.    RECOMMENDATIONS   Consider initiating donepezil. Staff should be cognizant of patient's cognitive impairment, as it could be a barrier to therapy. Rule out other causes for cognitive impairment (e.g., B12 deficiency; thyroid dysfunction, etc. - though these are less likely).    Patient should not be living alone at this time, as his cognitive issues place him at increased risk of something adverse occurring    Continued follow-up regarding his antidepressant is warranted.   DIAGNOSES:  Major Neurocognitive Disorder (i.e., dementia)  Central cord syndrome   Debbe MountsAdam T. Chez Bulnes, Psy.D.  Clinical Neuropsychologist  Rehabilitation Psychologist

## 2014-09-05 ENCOUNTER — Encounter (HOSPITAL_COMMUNITY): Payer: Medicare Other | Admitting: Occupational Therapy

## 2014-09-05 ENCOUNTER — Inpatient Hospital Stay (HOSPITAL_COMMUNITY): Payer: Medicare Other | Admitting: Speech Pathology

## 2014-09-05 ENCOUNTER — Inpatient Hospital Stay (HOSPITAL_COMMUNITY): Payer: Medicare Other | Admitting: Physical Therapy

## 2014-09-05 ENCOUNTER — Inpatient Hospital Stay (HOSPITAL_COMMUNITY): Payer: Medicare Other | Admitting: *Deleted

## 2014-09-05 DIAGNOSIS — E1149 Type 2 diabetes mellitus with other diabetic neurological complication: Secondary | ICD-10-CM

## 2014-09-05 DIAGNOSIS — M4712 Other spondylosis with myelopathy, cervical region: Secondary | ICD-10-CM

## 2014-09-05 DIAGNOSIS — I10 Essential (primary) hypertension: Secondary | ICD-10-CM

## 2014-09-05 DIAGNOSIS — G8250 Quadriplegia, unspecified: Secondary | ICD-10-CM

## 2014-09-05 DIAGNOSIS — Z5189 Encounter for other specified aftercare: Secondary | ICD-10-CM

## 2014-09-05 DIAGNOSIS — E1142 Type 2 diabetes mellitus with diabetic polyneuropathy: Secondary | ICD-10-CM

## 2014-09-05 LAB — URINE CULTURE

## 2014-09-05 LAB — GLUCOSE, CAPILLARY
GLUCOSE-CAPILLARY: 52 mg/dL — AB (ref 70–99)
GLUCOSE-CAPILLARY: 77 mg/dL (ref 70–99)
Glucose-Capillary: 54 mg/dL — ABNORMAL LOW (ref 70–99)
Glucose-Capillary: 54 mg/dL — ABNORMAL LOW (ref 70–99)
Glucose-Capillary: 120 mg/dL — ABNORMAL HIGH (ref 70–99)
Glucose-Capillary: 351 mg/dL — ABNORMAL HIGH (ref 70–99)
Glucose-Capillary: 225 mg/dL — ABNORMAL HIGH (ref 70–99)

## 2014-09-05 MED ORDER — INSULIN GLARGINE 100 UNIT/ML ~~LOC~~ SOLN
30.0000 [IU] | Freq: Every morning | SUBCUTANEOUS | Status: DC
  Administered 2014-09-05: 30 [IU] via SUBCUTANEOUS
  Filled 2014-09-05 (×3): qty 0.3

## 2014-09-05 MED ORDER — GLYBURIDE 5 MG PO TABS
10.0000 mg | ORAL_TABLET | Freq: Every day | ORAL | Status: DC
  Administered 2014-09-05 – 2014-09-06 (×2): 10 mg via ORAL
  Administered 2014-09-07: 5 mg via ORAL
  Administered 2014-09-08 – 2014-09-11 (×4): 10 mg via ORAL
  Filled 2014-09-05 (×8): qty 2

## 2014-09-05 NOTE — Progress Notes (Signed)
Recreational Therapy Assessment and Plan  Patient Details  Name: Javier Murillo MRN: [id number] Date of Birth: 1929/12/16 Today's Date: 09/05/2014  Rehab Potential: Good ELOS: 2-3 weeks   Assessment Clinical Impression: Problem List:  Patient Active Problem List    Diagnosis  Date Noted   .  Central cord syndrome  08/28/2014   .  HNP (herniated nucleus pulposus) with myelopathy, cervical  08/22/2014   .  Quadriplegia and quadriparesis  08/20/2014   .  Gait disorder  08/20/2014   .  Weakness  07/23/2014   .  Diabetes mellitus with renal complications  07/23/2014   .  Diabetic neuropathy  07/23/2014   .  Acute renal failure  07/23/2014   .  HTN (hypertension)  07/23/2014   .  Dyslipidemia  07/23/2014    Past Medical History:  Past Medical History   Diagnosis  Date   .  Quadriplegia and quadriparesis  08/20/2014   .  Diabetic peripheral neuropathy    .  Gait disorder    .  Peripheral vascular disease    .  Degenerative arthritis    .  Lumbosacral spondylosis    .  Depression    .  Chronic low back pain    .  Kidney stones      only once   .  Hypertension    .  Hyperlipidemia    .  Arthritis    .  Diabetes mellitus without complication     Past Surgical History:  Past Surgical History   Procedure  Laterality  Date   .  Shoulder surgery  Bilateral    .  Cataract extraction w/ intraocular lens implant, bilateral     .  Bursitis  Bilateral      olecranon I&D   .  Lumbar spine surgery       x7   .  Back surgery       x 7   .  Eye surgery       bilateral cataracts   .  Bone spur removed right sholder     .  Appendectomy     .  Anterior cervical decomp/discectomy fusion  N/A  08/22/2014     Procedure: ANTERIOR CERVICAL DECOMPRESSION/DISCECTOMY FUSION, cervical three-four; Surgeon: Hewitt Shorts, MD; Location: [facility]; Service: Neurosurgery; Laterality: N/A; C3-4 anterior cervical decompression with fusion plating and bonegraft    Assessment & Plan  Clinical  Impression: [name] is a 80 y.o. right-handed male with history of hypertension and insulin-dependent diabetes mellitus with peripheral neuropathy as well as multiple back surgeries. Presented 08/22/2014 with recent fall 07/14/2014 and progressive weakness in the upper extremities worse in the lower extremities. He was ambulating with a single-point cane and later need of a walker and wheelchair. MRI of cervical spine revealed multilevel spondylosis and degenerative disease with disc protrusions at all levels but with C3 and C4 spondylolisthesis and disc herniation resulting in severe stenosis with myelopathy. Underwent C3-4 anterior cervical decompression and arthrodesis 08/22/2014 per Dr. Newell Coral. Postoperative pain management. Maintained on Decadron protocol. Patient transferred to CIR on 08/28/2014.   Pt presents with decreased activity tolerance, decreased functional mobility, decreased balance, decreased coordination, decreased awareness of midline, decreased initiation, decreased attention, decreased problem solving, decreased memory, delayed processing, decreased safety, decreased awareness Limiting pt's independence with leisure/community pursuits.    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;Community - Travel (Comment);Nature - [employer] Expression Interests: Music (Comment);Play  instrument (Comment) (plays fiddle, guitar-bluegrass) Other Leisure Interests: Television Leisure Participation Style: With Family/Friends;Alone Awareness of Community Resources: Good-identify 3 post discharge leisure resources Psychosocial / Spiritual Social interaction - Mood/Behavior: Cooperative Academic librarian Appropriate for Education?: Yes Recreational Therapy Orientation Orientation -Reviewed with patient: Available activity resources Strengths/Weaknesses Patient Strengths/Abilities: Willingness to  participate Patient weaknesses: Physical limitations;Minimal Premorbid Leisure Activity TR Patient demonstrates impairments in the following area(s): Edema;Endurance;Motor;Pain;Safety;Skin Integrity  Plan Rec Therapy Plan Is patient appropriate for Therapeutic Recreation?: Yes Rehab Potential: Good Treatment times per week: Min 1 time per week >20 minutes Estimated Length of Stay: 2-3 weeks TR Treatment/Interventions: Adaptive equipment instruction;Balance/vestibular training;Functional mobility training;Cognitive remediation/compensation;Patient/family education;Therapeutic activities;Recreation/leisure participation;Provide activity resources in room;Therapeutic exercise;UE/LE Coordination activities;Wheelchair propulsion/positioning;Community reintegration;1:1 session Recommendations for other services: Neuropsych  Recommendations for other services: Neuropsych  Discharge Criteria: Patient will be discharged from TR if patient refuses treatment 3 consecutive times without medical reason.  If treatment goals not met, if there is a change in medical status, if patient makes no progress towards goals or if patient is discharged from hospital.  The above assessment, treatment plan, treatment alternatives and goals were discussed and mutually agreed upon: by patient  Lanier 09/05/2014, 4:22 PM

## 2014-09-05 NOTE — Progress Notes (Signed)
Physical Therapy Weekly Progress Note  Patient Details  Name: Javier Murillo MRN: [id number] Date of Birth: 1930/02/15  Beginning of progress report period: August 28, 2014 End of progress report period: September 05, 2014  Today's Date: 09/05/2014 PT Individual Time: 0930-1030 Tx 2: 1300-1400 PT Individual Time Calculation (min): 60 min Tx 2:60 min  Patient has met 3 of 5 short term goals.  Long term goals updated (downgraded) due to updated d/c plan for SNF.   Patient continues to demonstrate the following deficits: impaired static and dynamic sit and stand balance, difficulty with ambulation, low activity tolerance, impaired proprioception in B UEs/LEs, motor apraxia and ataxia, R side inattention, and difficulty with bed mobility and transfers and therefore will continue to benefit from skilled PT intervention to enhance overall performance with activity tolerance, balance, postural control, ability to compensate for deficits, attention, awareness and coordination.  Patient not progressing toward long term goals.  See goal revision..  Plan of care revisions: goals downgraded due to d/c plan changed to SNF.  PT Short Term Goals Week 1:  PT Short Term Goal 1 (Week 1): Pt will roll R and L in bed req min A and verbal cues for sequencing.  PT Short Term Goal 1 - Progress (Week 1): Progressing toward goal PT Short Term Goal 2 (Week 1): Pt will demonstrate supine to/from sit req mod A.  PT Short Term Goal 2 - Progress (Week 1): Met PT Short Term Goal 3 (Week 1): Pt will transfer w/c to/from bed req mod A.  PT Short Term Goal 3 - Progress (Week 1): Not met PT Short Term Goal 4 (Week 1): Pt will tolerate ambulation x 20' with +2 assist PT Short Term Goal 4 - Progress (Week 1): Met PT Short Term Goal 5 (Week 1): Pt will demonstrate w/c propulsion on level surface with B LEs x 100' req SBA.  PT Short Term Goal 5 - Progress (Week 1): Met Week 2:  PT Short Term Goal 1 (Week 2): Pt will roll  R and L in bed req min A and verbal cues for sequencing.  PT Short Term Goal 2 (Week 2): Pt will transfer w/c to/from bed req mod A.  PT Short Term Goal 3 (Week 2): Pt will demonstrate supine to/from sit req min A PT Short Term Goal 4 (Week 2): Pt will demonstrate ambulation x 20' req mod A and LRAD.  PT Short Term Goal 5 (Week 2): Pt will demonstrate w/c propulsion with B UE/LEs x 150' req SBA.   Skilled Therapeutic Interventions/Progress Updates:    Therapeutic Activity: PT instructs pt in stand-pivot transfer w/c to bed req max A - pt's legs buckle as he loses focus on keeping them straight.  PT instructs pt in sit to supine transfer req mod A for B LEs - pt almost gets both LEs onto bed, but still needs assist.  PT instructs pt in rolling R and L without rail req simple, one step verbal cues for foot and hand placement and min-mod A.  PT instructs pt in L side lie to sit transfer without rails req mod A and simple, one step cues. PT instructs pt in stand/squat-pivot transfer bed to w/c req max A - pt unable to follow commands mid-transfer due to cognitive deficits.  PT instructs pt in stand-pivot transfer with standard walker req max A due to poor motor planning once in stand and difficulty following commands/legs buckle when attention is directed elsewhere.   W/C Propulsion:  PT instructs pt in w/c propulsion with B LEs/UEs x 100' req SBA - pt demonstrates R side inattention, evidenced by proximity to the wall on the R. Slow movement.   Gait Training: PT instructs pt in ambulation x 20' with R wall rail and L HW req assist to progress walker, repeated verbal cues for upright posture (but PT allows pt to also look at his feet due to impaired proprioception, intermittently), req mod A for balance and +2 assist for w/c follow. At end of walk, pt fatigued and legs buckle - PT assists pt back into w/c.   Neuromuscular Reeducation: PT instructs pt in sit to stand with R wall rail and L HW with  mirror for visual feedback to improve posture x 2 reps, with focus on achieving balance in static stand, first with R wall rail and L HW, then progressing to L HW only - req min-mod A for up to 10 seconds and repeated verbal cues for upright posture.   Tx 2: Gait Training: PT instructs pt in ambulation with R handrail and L HW x 15' + 15' req mod A for balance and w/c follow for safety. Pt demonstrates improved HW management during PT session.   Therapeutic Activity: PT instructs pt in sit to stand req mod A multiple times during therapy session. PT instructs pt in stand-step transfer onto toilet from w/c req max A, including verbal cues for hand placement. PT instructs pt in stand-step/pivot transfer off of toilet to w/c with grab bar req 2 person assist.   W/C Management: PT instructs pt in w/c propulsion with B UEs/LEs req SBA, significantly increased time, and encouragement 100' x 2 reps.   Pt continues to require significant encouragement to participate in PT, but is making slow functional gains, mostly in bed mobility and ambulation distance with appropriate AD. Pt states he does not believe balance can be improved. PT educates pt and encourages him, but he remains stubborn and difficult to work with. Son present during first session. Pt's behavior is improved during second session when family is not present. Continue per PT POC.   Therapy Documentation Precautions:  Precautions Precautions: Cervical;Fall Required Braces or Orthoses: Cervical Brace Cervical Brace: Soft collar Restrictions Weight Bearing Restrictions: No Pain: Pain Assessment Pain Assessment: Faces Faces Pain Scale: Hurts even more Pain Type: Acute pain Pain Location: Neck Pain Orientation: Anterior;Posterior Pain Onset: On-going Pain Intervention(s): Rest Multiple Pain Sites: No Tx 2: Pt c/o 2/10 neck pain and PT utilizes rest and repositioning to decrease pain.  See FIM for current functional  status  Therapy/Group: Individual Therapy  Kirti Carl M 09/05/2014, 9:36 AM

## 2014-09-05 NOTE — Progress Notes (Signed)
Hypoglycemic Event  CBG: 52  Treatment: 15 GM carbohydrate snack  Symptoms: None  Follow-up CBG: Time:0715 CBG Result:54  Possible Reasons for Event: Unknown  Comments/MD notified: Dr. Milda SmartSwartz    Javier Murillo, Javier Murillo  Remember to initiate Hypoglycemia Order Set & complete

## 2014-09-05 NOTE — Progress Notes (Signed)
[place] PHYSICAL MEDICINE & REHABILITATION     PROGRESS NOTE    Subjective/Complaints: No new issues. Sugars low in the am. Irritable at time. A  review of systems has been performed and if not noted above is otherwise negative.   Objective: Vital Signs: Blood pressure 127/53, pulse 65, temperature 98.3 F (36.8 C), temperature source Oral, resp. rate 16, height 6' (1.829 m), weight 70.9 kg (156 lb 4.9 oz), SpO2 96.00%. No results found. No results found for this basename: WBC, HGB, HCT, PLT,  in the last 72 hours No results found for this basename: NA, K, CL, CO, GLUCOSE, BUN, CREATININE, CALCIUM,  in the last 72 hours CBG (last 3)   Recent Labs  09/04/14 1143 09/04/14 1652 09/04/14 2036  GLUCAP 267* 217* 157*    Wt Readings from Last 3 Encounters:  09/05/14 70.9 kg (156 lb 4.9 oz)  08/22/14 71 kg (156 lb 8.4 oz)  08/22/14 71 kg (156 lb 8.4 oz)    Physical Exam:   General ADL Comments: CNA educated on proper set up to increase independence with self feeding  Cognition:  Cognition  Overall Cognitive Status: Impaired/Different from baseline  Orientation Level: Oriented to person;Oriented to time;Oriented to situation;Disoriented to place  Cognition  Arousal/Alertness: Awake/alert  Behavior During Therapy: WFL for tasks assessed/performed  Overall Cognitive Status: Impaired/Different from baseline    Physical Exam:    Constitutional: He appears well-developed.  HENT: dentition fair/poor Head: Normocephalic.  Eyes: EOM are normal.  Neck: Neck supple. No thyromegaly present.  Cardiovascular: Normal rate and regular rhythm.  Respiratory: Effort normal and breath sounds normal. No respiratory distress.  GI: Soft. Bowel sounds are normal. He exhibits no distension.  Neurological: He is alert.  His voice is intelligible. He provides his name and age as well as address. Follows simple commands.  He is hard of hearing. Sometimes slow to respond. Doesn't initiate  much conversation.  Sensory loss to PP/LT in hands. MMT: deltoids 4/5. Bicep/triceps 4/5. Wrist 4-, Right HI 3- to 3/5. Left HI 2+ to 3-. LE: HF 3+, KE 4-, ADF/APF 4/5. No resting tone. DTR's 1+.    Skin:  Surgical neck incision clean and dry  Psychiatric: He has a normal mood and affect. His behavior is normal     Assessment/Plan: 1. 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 Physiatrist is providing close team supervision and 24 hour management of active medical problems listed below. Physiatrist and rehab team continue to assess barriers to discharge/monitor patient progress toward functional and medical goals.     FIM: FIM - Bathing Bathing Steps Patient Completed: Chest;Right Arm;Left Arm;Abdomen;Front perineal area;Right upper leg;Left upper leg Bathing: 1: Total-Patient completes 0-2 of 10 parts or less than 25%  FIM - Upper Body Dressing/Undressing Upper body dressing/undressing steps patient completed: Put head through opening of pull over shirt/dress Upper body dressing/undressing: 1: Total-Patient completed less than 25% of tasks FIM - Lower Body Dressing/Undressing Lower body dressing/undressing: 1: Total-Patient completed less than 25% of tasks  FIM - Toileting Toileting: 1: Total-Patient completed zero steps, helper did all 3  FIM - Diplomatic Services operational officer Devices: Best boy Transfers: 2-To toilet/BSC: Max A (lift and lower assist);2-From toilet/BSC: Max A (lift and lower assist)  FIM - Press photographer Assistive Devices: Arm rests Bed/Chair Transfer: 3: Chair or W/C > Bed: Mod A (lift  or lower assist)  FIM - Locomotion: Wheelchair Distance: 1x30', 1x50' Locomotion: Wheelchair: 1: Travels less than 50 ft with minimal assistance (Pt.>75%) FIM -  Locomotion: Ambulation Locomotion: Ambulation Assistive Devices: Other (comment) (L rail) Ambulation/Gait Assistance: 3: Mod assist;1: +2 Total assist;Other (comment) (+2A for w/c follow) Locomotion: Ambulation: 1: Two helpers  Comprehension Comprehension Mode: Auditory Comprehension: 3-Understands basic 50 - 74% of the time/requires cueing 25 - 50%  of the time  Expression Expression Mode: Verbal Expression: 3-Expresses basic 50 - 74% of the time/requires cueing 25 - 50% of the time. Needs to repeat parts of sentences.  Social Interaction Social Interaction Mode: Asleep Social Interaction: 3-Interacts appropriately 50 - 74% of the time - May be physically or verbally inappropriate.  Problem Solving Problem Solving Mode: Asleep Problem Solving: 3-Solves basic 50 - 74% of the time/requires cueing 25 - 49% of the time  Memory Memory Mode: Asleep Memory: 3-Recognizes or recalls 50 - 74% of the time/requires cueing 25 - 49% of the time  Medical Problem List and Plan:  1. Functional deficits secondary to C3-4 myelopathy status post decompression fusion. Central cord presentation.  2. DVT Prophylaxis/Anticoagulation: SCDs. Monitor for any signs of DVT. Dopplers negative 3. Pain Management: Hydrocodone as needed. Monitor with increased mobility  4. Mood/depression: Prozac 20 mg daily. Provide emotional support and  5. Neuropsych: This patient is capable of making decisions on his own behalf.  6. Skin/Wound Care: Routine skin checks  7. Diabetes mellitus with peripheral neuropathy.  Adjust Lantus insulin to 30 u qam.   - add regular insulin scheduled with meals  -change diabeta to 10mg  qam.  -now off steroids  -rx uti 8. Hypertension. Tenormin 12.5 mg daily. Monitor with increased mobility  9. Urine: ecoli uti 100k  continue empiric cipro  -sens pending LOS (Days) 8 A FACE TO FACE EVALUATION WAS PERFORMED  SWARTZ,ZACHARY T 09/05/2014 7:56 AM

## 2014-09-05 NOTE — Progress Notes (Signed)
Occupational Therapy Session Note  Patient Details  Name: Javier Murillo MRN: [id number] Date of Birth: [birth year]  Today's Date: 09/05/2014 OT Individual Time: 4098-11910730-0830 OT Individual Time Calculation (min): 60 min    Short Term Goals: Week 1:  OT Short Term Goal 1 (Week 1): Pt will demonstrate ability to complete self-feeding using appropriate AE with min assist to problem-solve OT Short Term Goal 2 (Week 1): Pt will bathe upper body at w/c level at sink side with mod assist OT Short Term Goal 3 (Week 1): Pt will demo ability to stand supported with steadying assist to facilitate assist with lower body dressing OT Short Term Goal 4 (Week 1): [facility] with mod assist  OT Short Term Goal 5 (Week 1): Pt will sustain attention to task during BADL for 10 min with min verbal cues for redirection  Skilled Therapeutic Interventions/Progress Updates:  Pt seated in w/c and feeding self w/ R hand using fork w/ built up handle when arriving. Pt reported pain of 3-4/10 in neck when arriving. Pt and nurse tech reported that blood glucose was at 54 before beginning session, and was at 120 after pt finished eating. Pt oriented to person and place, used calender on wall to state month and date. Pt completed UB/LB B&D while seated in w/c at sink, completing 1 sit to stand t/f. Pt crossed legs for donning pants and socks. Pt intermittently able to answer questions and follow commands, needing mod v/c during session for initiating movements. Therapist repeated many questions and commands and pt often had long pauses before answering questions or beginning movements. Pt reported that he was "always waiting" and when further questioned about what he meant he stated he was "waiting to die." Therapist offered psychosocial support and encouragement for completing therapy session. Pt seated in w/c w/ all needs nearby when leaving.  Therapy Documentation Precautions:   Precautions Precautions: Cervical;Fall Required Braces or Orthoses: Cervical Brace Cervical Brace: Soft collar Restrictions Weight Bearing Restrictions: No  See FIM for current functional status  Therapy/Group: Individual Therapy  Marcheta Horsey Raynell 09/05/2014, 12:42 PM

## 2014-09-05 NOTE — Significant Event (Signed)
Hypoglycemic Event  CBG: 54  Treatment: 15 GM carbohydrate snack  Symptoms: breakfast  Follow-up CBG: Time:0745 CBG Result:120  Possible Reasons for Event: Medication regimen: adjustment of insulin  Comments/MD notified:swratz    Madie RenoHicks, [place] Deitrick Carol  Remember to initiate Hypoglycemia Order Set & complete

## 2014-09-05 NOTE — Plan of Care (Signed)
Problem: RH Balance Goal: LTG Patient will maintain dynamic standing balance (PT) LTG: Patient will maintain dynamic standing balance with assistance during mobility activities (PT)  Goal downgraded due to lack of progress  Problem: RH Bed Mobility Goal: LTG Patient will perform bed mobility with assist (PT) LTG: Patient will perform bed mobility with assistance, with/without cues (PT).  Goal downgraded due to lack of progress  Problem: RH Bed to Chair Transfers Goal: LTG Patient will perform bed/chair transfers w/assist (PT) LTG: Patient will perform bed/chair transfers with assistance, with/without cues (PT).  Goal downgraded due to lack of progress  Problem: RH Car Transfers Goal: LTG Patient will perform car transfers with assist (PT) LTG: Patient will perform car transfers with assistance (PT).  Goal downgraded due to lack of progress  Problem: RH Furniture Transfers Goal: LTG Patient will perform furniture transfers w/assist (OT/PT LTG: Patient will perform furniture transfers with assistance (OT/PT).  Goal downgraded due to lack of progress  Problem: RH Ambulation Goal: LTG Patient will ambulate in controlled environment (PT) LTG: Patient will ambulate in a controlled environment, # of feet with assistance (PT).  Goal downgraded due to lack of progress  Problem: RH Wheelchair Mobility Goal: LTG Patient will propel w/c in controlled environment (PT) LTG: Patient will propel wheelchair in controlled environment, # of feet with assist (PT)  Goal downgraded due to lack of progress

## 2014-09-05 NOTE — Progress Notes (Signed)
The skilled treatment note has been reviewed and SLP is in agreement.  Deseree Zemaitis, M.A., CCC-SLP  [phone]   

## 2014-09-05 NOTE — Progress Notes (Signed)
Speech Language Pathology Daily Session Note  Patient Details  Name: Javier Murillo MRN: [id number] Date of Birth: 1930/10/28  Today's Date: 09/05/2014 SLP Individual Time: [id number] SLP Individual Time Calculation (min): 60 min  Short Term Goals: Week 1: SLP Short Term Goal 1 (Week 1): Patient will demonstrate sustained attention to a functional task for 10 minutes with Mod A multimodal cues for redirection.  SLP Short Term Goal 2 (Week 1): Patient will utilize external memory aids to recall new, daily information with Min A multimodal cues.  SLP Short Term Goal 3 (Week 1): Patient will demonstrate functional problem solving for basic and familiar tasks with Min A multimodal cues.  SLP Short Term Goal 4 (Week 1): Patient will utilize the call bell to request assistance with supervision multimodal cues.  SLP Short Term Goal 5 (Week 1): Patient will identify 1 goal of each individual therapy (PT/OT/ST) in order to minimize frustration tolerance with Mod A multimodal cues.    Skilled Therapeutic Interventions: Skilled treatment session focused on cognitive linguistic goals. Student facilitated session by providing Max A multimodal cues for functional problem solving and selective attention during a basic money management task. Patient demonstrated intellectual awareness into difficulty of task and verbalized that he didn't realize how much his fall has impacted his memory. Patient left in room with quick release belt in place and all needs within reach. Continue with current plan of care.    FIM:  Comprehension Comprehension Mode: Auditory Comprehension: 3-Understands basic 50 - 74% of the time/requires cueing 25 - 50%  of the time Expression Expression Mode: Verbal Expression: 4-Expresses basic 75 - 89% of the time/requires cueing 10 - 24% of the time. Needs helper to occlude trach/needs to repeat words. Social Interaction Social Interaction: 4-Interacts appropriately 75 - 89% of the time  - Needs redirection for appropriate language or to initiate interaction. Problem Solving Problem Solving: 2-Solves basic 25 - 49% of the time - needs direction more than half the time to initiate, plan or complete simple activities Memory Memory: 2-Recognizes or recalls 25 - 49% of the time/requires cueing 51 - 75% of the time  Pain Pain Assessment Pain Assessment: 0-10 Pain Score:  (unable to rate) Faces Pain Scale: Hurts a little bit Pain Type: Acute pain Pain Location: Buttocks Pain Orientation: Medial Pain Descriptors / Indicators: Aching Pain Onset: Gradual Pain Intervention(s): RN made aware;Repositioned Multiple Pain Sites: No  Therapy/Group: Individual Therapy  Solae Norling 09/05/2014, 3:19 PM

## 2014-09-05 NOTE — Progress Notes (Signed)
Note reviewed and accurately reflects treatment session.   

## 2014-09-05 NOTE — Significant Event (Signed)
Hypoglycemic Event  CBG: 54  Treatment: 15 GM carbohydrate snack  Symptoms: None  Follow-up CBG: Time:0725 CBG Result:54  Possible Reasons for Event: Medication regimen: adjustment with insulin  Comments/MD notified:swartz    Javier Murillo, Hollan Philipp Carol  Remember to initiate Hypoglycemia Order Set & complete

## 2014-09-06 ENCOUNTER — Inpatient Hospital Stay (HOSPITAL_COMMUNITY): Payer: Medicare Other

## 2014-09-06 ENCOUNTER — Inpatient Hospital Stay (HOSPITAL_COMMUNITY): Payer: Medicare Other | Admitting: Speech Pathology

## 2014-09-06 ENCOUNTER — Inpatient Hospital Stay (HOSPITAL_COMMUNITY): Payer: Medicare Other | Admitting: Physical Therapy

## 2014-09-06 DIAGNOSIS — I10 Essential (primary) hypertension: Secondary | ICD-10-CM

## 2014-09-06 DIAGNOSIS — E1142 Type 2 diabetes mellitus with diabetic polyneuropathy: Secondary | ICD-10-CM

## 2014-09-06 DIAGNOSIS — G8250 Quadriplegia, unspecified: Secondary | ICD-10-CM

## 2014-09-06 DIAGNOSIS — E1121 Type 2 diabetes mellitus with diabetic nephropathy: Secondary | ICD-10-CM

## 2014-09-06 DIAGNOSIS — N390 Urinary tract infection, site not specified: Secondary | ICD-10-CM

## 2014-09-06 DIAGNOSIS — S14129D Central cord syndrome at unspecified level of cervical spinal cord, subsequent encounter: Secondary | ICD-10-CM

## 2014-09-06 LAB — GLUCOSE, CAPILLARY
Glucose-Capillary: 106 mg/dL — ABNORMAL HIGH (ref 70–99)
Glucose-Capillary: 57 mg/dL — ABNORMAL LOW (ref 70–99)
Glucose-Capillary: 406 mg/dL — ABNORMAL HIGH (ref 70–99)
Glucose-Capillary: 152 mg/dL — ABNORMAL HIGH (ref 70–99)
Glucose-Capillary: 47 mg/dL — ABNORMAL LOW (ref 70–99)
Glucose-Capillary: 91 mg/dL (ref 70–99)

## 2014-09-06 MED ORDER — INSULIN GLARGINE 100 UNIT/ML ~~LOC~~ SOLN
20.0000 [IU] | Freq: Every morning | SUBCUTANEOUS | Status: DC
  Administered 2014-09-06 – 2014-09-07 (×2): 20 [IU] via SUBCUTANEOUS
  Filled 2014-09-06 (×2): qty 0.2

## 2014-09-06 NOTE — Progress Notes (Signed)
Recreational Therapy Session Note  Patient Details  Name: Javier Murillo MRN: [id number] Date of Birth: March 06, 1930 Today's Date: 09/06/2014  Pain: no c/o Skilled Therapeutic Interventions/Progress Updates: Session focused on activity tolerance, w/c mobility, ambulation with standard walker, dynamic standing balance.  Pt stood while listening to bluegrass music to dance (included forward stepping, side stepping, and stepping backward with +2 assist.  Pt stood with UE's on LRT/CTRS shoulders with PT assisting at hips for balance.  Pt required simple one step commands to complete task.  Pt propelled w/c using BUEs or BLE's with close supervision-min assist & min verbal cues.  Pt performed dual tasking ambulating with standard walker while sharing a story with LRT/CTRS.  Pt required mod assist for ambulation with +2 assist for w/c follow.  Therapy/Group: Co-Treatment  Argie Applegate 09/06/2014, 1:22 PM

## 2014-09-06 NOTE — Progress Notes (Signed)
Speech Language Pathology Daily Session Note  Patient Details  Name: Javier Murillo MRN: [id number] Date of Birth: February 29, 1930  Today's Date: 09/06/2014 SLP Individual Time: 1400-1500 SLP Individual Time Calculation (min): 60 min  Short Term Goals: Week 1: SLP Short Term Goal 1 (Week 1): Patient will demonstrate sustained attention to a functional task for 10 minutes with Mod A multimodal cues for redirection.  SLP Short Term Goal 2 (Week 1): Patient will utilize external memory aids to recall new, daily information with Min A multimodal cues.  SLP Short Term Goal 3 (Week 1): Patient will demonstrate functional problem solving for basic and familiar tasks with Min A multimodal cues.  SLP Short Term Goal 4 (Week 1): Patient will utilize the call bell to request assistance with supervision multimodal cues.  SLP Short Term Goal 5 (Week 1): Patient will identify 1 goal of each individual therapy (PT/OT/ST) in order to minimize frustration tolerance with Mod A multimodal cues.    Skilled Therapeutic Interventions: Skilled treatment session focused on cognitive-linguistic goals. Student facilitated session by providing Max A multimodal cues for functional problem solving and intellectual awareness of current physical and cognitive impairments and goals of skilled therapeutic intervention across disciplines. Student utilized written aid to increase recall and reinforce education provided but patient continued to verbalize/demonstrate minimal emergent awareness throughout functional conversation. Patient left in room in tilt-in-space w/c with quick release belt in place and all needs within reach. Continue with current plan of care.    FIM:  Comprehension Comprehension Mode: Auditory Comprehension: 3-Understands basic 50 - 74% of the time/requires cueing 25 - 50%  of the time Expression Expression Mode: Verbal Expression: 4-Expresses basic 75 - 89% of the time/requires cueing 10 - 24% of the time.  Needs helper to occlude trach/needs to repeat words. Social Interaction Social Interaction: 3-Interacts appropriately 50 - 74% of the time - May be physically or verbally inappropriate. Problem Solving Problem Solving: 2-Solves basic 25 - 49% of the time - needs direction more than half the time to initiate, plan or complete simple activities Memory Memory: 3-Recognizes or recalls 50 - 74% of the time/requires cueing 25 - 49% of the time  Pain Pain Assessment Pain Assessment: No/denies pain  Therapy/Group: Individual Therapy  Daylah Sayavong 09/06/2014, 3:42 PM

## 2014-09-06 NOTE — Progress Notes (Signed)
Occupational Therapy Weekly Progress Note  Patient Details  Name: Javier Murillo MRN: [id number] Date of Birth: November 24, 1930  Beginning of progress report period: August 28, 2014 End of progress report period: September 06, 2014  Today's Date: 09/06/2014 OT Individual Time: 0728-0828 and 1300-1330 OT Individual Time Calculation (min): 60 min and 30 min     Patient has met 3 of 5 short term goals.  Patient currently required max assist for functional transfers and sit<>stand during self-care tasks. Patient is improving strength and AROM of BUE, however, coordination and strength limits his abilities to complete UB dressing more independently. Patient's biggest barriers are attention, motor apraxia and ataxia, and impaired proprioception.  Patient continues to demonstrate the following deficits: decreased static and dynamic standing balance, decreased activity tolerance, decreased strength, decreased coordination, impaired proprioception, motor apraxia and ataxia, decreased functional use of BUE/BLE, decreased awareness, and impaired problem solving and therefore will continue to benefit from skilled OT intervention to enhance overall performance with BADLs, ability to compensate for deficits, functional use of BUE/BLE, attention, and coordination.  Patient progressing toward long term goals..  Patient dishcarge plans to SNF.  OT Short Term Goals Week 1:  OT Short Term Goal 1 (Week 1): Pt will demonstrate ability to complete self-feeding using appropriate AE with min assist to problem-solve OT Short Term Goal 1 - Progress (Week 1): Met OT Short Term Goal 2 (Week 1): Pt will bathe upper body at w/c level at sink side with mod assist OT Short Term Goal 2 - Progress (Week 1): Progressing toward goal OT Short Term Goal 3 (Week 1): Pt will demo ability to stand supported with steadying assist to facilitate assist with lower body dressing OT Short Term Goal 3 - Progress (Week 1): Met OT Short Term  Goal 4 (Week 1): Pt will complete transfer to [facility] from w/c with mod assist  OT Short Term Goal 4 - Progress (Week 1): Not met OT Short Term Goal 5 (Week 1): Pt will sustain attention to task during BADL for 10 min with min verbal cues for redirection OT Short Term Goal 5 - Progress (Week 1): Met Week 2:  OT Short Term Goal 1 (Week 2): Pt will complete LB dressing with max assist OT Short Term Goal 2 (Week 2): Pt will sustain attention to task during BADL for 15 min with min cues for redirection OT Short Term Goal 3 (Week 2): Pt will complete transfer w/c<>BSC/toilet with mod assist  OT Short Term Goal 4 (Week 2): Pt will complete sit<>stand during LB dressing/bathing with mod assist  Skilled Therapeutic Interventions/Progress Updates:    Session 1: Pt seen for ADL retraining with focus on functional use of BUE, functional transfers, sit<>stand, and attention. Pt received supine in bed reporting feeling "tired" however agreeable to therapy. Pt completed stand pivot transfer bed>w/c with max assist and mod cues for safety and technique. Engaged in bathing at sink with mod cues for attention to task. Pt required hand over hand assist to wash RUE and top of LUE. Required max assist for sit<>stand 2x and min assist for static standing balance as therapist managed clothing over waist. Pt required min cues for problem solving for setup of breakfast tray then able to complete self-feeding with increased time. Pt left sitting in w/c with all needs in reach.  Session 2: Pt seen for 1:1 OT session with focus on attention, functional transfers, standing balance, and functional use of BUE. Pt received sitting in w/c reporting incontinent episode.  Completed stand pivot transfer with max assist w/c>toilet with use of grab bars and max cues and manual facilitation for pivoting of BLEs. Required total assist for hygiene and clothing management with pt standing with min assist using grab bars. Engaged in oral care at sink  with emphasis on grip and coordination with toothbrush and elbow extension to turn water on.  Pt required mod cues for attention to task during session. Pt left sitting in w/c with all needs in reach.  Therapy Documentation Precautions:  Precautions Precautions: Cervical;Fall Required Braces or Orthoses: Cervical Brace Cervical Brace: Soft collar Restrictions Weight Bearing Restrictions: No General:   Vital Signs: Therapy Vitals Temp: 97.5 F (36.4 C) Temp src: Oral Pulse Rate: 51 Resp: 17 BP: 134/56 mmHg Patient Position (if appropriate): Lying Oxygen Therapy SpO2: 98 % O2 Device: None (Room air) Pain: Pain Assessment Pain Assessment: 0-10 Pain Score:  (unable to rate) Pain Type: Acute pain Pain Location: Buttocks Pain Descriptors / Indicators: Aching Pain Onset: Gradual Pain Intervention(s): Medication (See eMAR) Multiple Pain Sites: No   Other Treatments:    See FIM for current functional status  Therapy/Group: Individual Therapy  Duayne Cal 09/06/2014, 7:15 AM

## 2014-09-06 NOTE — Progress Notes (Signed)
[place] PHYSICAL MEDICINE & REHABILITATION     PROGRESS NOTE    Subjective/Complaints: Sugar in 50's again this am. Says that his sugars run low at home in the morning as well.  A  review of systems has been performed and if not noted above is otherwise negative.   Objective: Vital Signs: Blood pressure 134/56, pulse 51, temperature 97.5 F (36.4 C), temperature source Oral, resp. rate 17, height 6' (1.829 m), weight 70.9 kg (156 lb 4.9 oz), SpO2 98.00%. No results found. No results found for this basename: WBC, HGB, HCT, PLT,  in the last 72 hours No results found for this basename: NA, K, CL, CO, GLUCOSE, BUN, CREATININE, CALCIUM,  in the last 72 hours CBG (last 3)   Recent Labs  09/05/14 2107 09/06/14 0652 09/06/14 0725  GLUCAP 225* 57* 106*    Wt Readings from Last 3 Encounters:  09/05/14 70.9 kg (156 lb 4.9 oz)  08/22/14 71 kg (156 lb 8.4 oz)  08/22/14 71 kg (156 lb 8.4 oz)    Physical Exam:   General ADL Comments: CNA educated on proper set up to increase independence with self feeding  Cognition:  Cognition  Overall Cognitive Status: Impaired/Different from baseline  Orientation Level: Oriented to person;Oriented to time;Oriented to situation;Disoriented to place  Cognition  Arousal/Alertness: Awake/alert  Behavior During Therapy: WFL for tasks assessed/performed  Overall Cognitive Status: Impaired/Different from baseline    Physical Exam:    Constitutional: He appears well-developed.  HENT: dentition fair/poor Head: Normocephalic.  Eyes: EOM are normal.  Neck: Neck supple. No thyromegaly present.  Cardiovascular: Normal rate and regular rhythm.  Respiratory: Effort normal and breath sounds normal. No respiratory distress.  GI: Soft. Bowel sounds are normal. He exhibits no distension.  Neurological: He is alert.  His voice is intelligible. He provides his name and age as well as address. Follows simple commands.  He is hard of hearing. Sometimes  slow to respond. Doesn't initiate much conversation.  Sensory loss to PP/LT in hands. MMT: deltoids 4/5. Bicep/triceps 4/5. Wrist 4-, Right HI 3- to 3/5. Left HI 2+ to 3-. LE: HF 3+, KE 4-, ADF/APF 4/5. No resting tone. DTR's 1+.    Skin:  Surgical neck incision clean and dry  Psychiatric: He has a normal mood and affect. His behavior is normal     Assessment/Plan: 1. 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 Physiatrist is providing close team supervision and 24 hour management of active medical problems listed below. Physiatrist and rehab team continue to assess barriers to discharge/monitor patient progress toward functional and medical goals.     FIM: FIM - Bathing Bathing Steps Patient Completed: Chest;Right upper leg;Left upper leg;Abdomen Bathing: 2: Max-Patient completes 3-4 6028f 10 parts or 25-49%  FIM - Upper Body Dressing/Undressing Upper body dressing/undressing steps patient completed: Thread/unthread left sleeve of pullover shirt/dress Upper body dressing/undressing: 1: Total-Patient completed less than 25% of tasks FIM - Lower Body Dressing/Undressing Lower body dressing/undressing: 1: Total-Patient completed less than 25% of tasks  FIM - Toileting Toileting: 1: Two helpers  FIM - Diplomatic Services operational officerToilet Transfers Toilet Transfers Assistive Devices: Grab bars Toilet Transfers: 2-To toilet/BSC: Max A (lift and lower assist);1-Two helpers;1-From toilet/BSC: Total A (helper does all/Pt. < 25%)  FIM - BankerBed/Chair Transfer Bed/Chair Transfer Assistive Devices: Walker;Arm rests;Orthosis (soft collar) Bed/Chair Transfer: 3: Sit > Supine: Mod A (lifting assist/Pt. 50-74%/lift 2  legs);2: Bed > Chair or W/C: Max A (lift and lower assist)  FIM - Locomotion: Wheelchair Distance: 150 Locomotion: Wheelchair: 5: Travels 150 ft or more:  maneuvers on rugs and over door sills with supervision, cueing or coaxing FIM - Locomotion: Ambulation Locomotion: Ambulation Assistive Devices: Walker - Hemi;Other (comment) (R wall rail) Ambulation/Gait Assistance: 1: +2 Total assist Locomotion: Ambulation: 1: Two helpers  Comprehension Comprehension Mode: Auditory Comprehension: 3-Understands basic 50 - 74% of the time/requires cueing 25 - 50%  of the time  Expression Expression Mode: Verbal Expression: 3-Expresses basic 50 - 74% of the time/requires cueing 25 - 50% of the time. Needs to repeat parts of sentences.  Social Interaction Social Interaction Mode: Asleep Social Interaction: 3-Interacts appropriately 50 - 74% of the time - May be physically or verbally inappropriate.  Problem Solving Problem Solving Mode: Asleep Problem Solving: 3-Solves basic 50 - 74% of the time/requires cueing 25 - 49% of the time  Memory Memory Mode: Asleep Memory: 3-Recognizes or recalls 50 - 74% of the time/requires cueing 25 - 49% of the time  Medical Problem List and Plan:  1. Functional deficits secondary to C3-4 myelopathy status post decompression fusion. Central cord presentation.  2. DVT Prophylaxis/Anticoagulation: SCDs. Monitor for any signs of DVT. Dopplers negative 3. Pain Management: Hydrocodone as needed. Monitor with increased mobility  4. Mood/depression: Prozac 20 mg daily. Provide emotional support and  5. Neuropsych: This patient is capable of making decisions on his own behalf.  6. Skin/Wound Care: Routine skin checks  7. Diabetes mellitus with peripheral neuropathy.  Adjust Lantus insulin to 20 u qam.   -CONTINUE regular insulin scheduled with lunch and dinner  -changed diabeta to 10mg  qam.  -now off steroids  -rx uti  -needs hs snack 8. Hypertension. Tenormin 12.5 mg daily. Monitor with increased mobility  9. Urine: ecoli uti 100k  sens to  cipro    LOS (Days) 9 A FACE TO FACE EVALUATION WAS  PERFORMED  Kambree Krauss T 09/06/2014 10:39 AM

## 2014-09-06 NOTE — Patient Care Conference (Signed)
Inpatient RehabilitationTeam Conference and Plan of Care Update Date: 09/04/2014   Time: 2:20  PM    Patient Name: Javier Murillo      Medical Record Number: [id number]  Date of Birth: Feb 29, 1930 Sex: Male         Room/Bed: 4W15C/4W15C-01 Payor Info: Payor: Advertising copywriter MEDICARE / Plan: AARP MEDICARE COMPLETE / Product Type: *No Product type* /    Admitting Diagnosis: central cord   Admit Date/Time:  08/28/2014  2:20 PM Admission Comments: No comment available   Primary Diagnosis:  <principal problem not specified> Principal Problem: <principal problem not specified>  Patient Active Problem List   Diagnosis Date Noted  . Central cord syndrome 08/28/2014  . HNP (herniated nucleus pulposus) with myelopathy, cervical 08/22/2014  . Quadriplegia and quadriparesis 08/20/2014  . Gait disorder 08/20/2014  . Weakness 07/23/2014  . Diabetes mellitus with renal complications 07/23/2014  . Diabetic neuropathy 07/23/2014  . Acute renal failure 07/23/2014  . HTN (hypertension) 07/23/2014  . Dyslipidemia 07/23/2014    Expected Discharge Date: Expected Discharge Date:  (SNF)  Team Members Present: Physician leading conference: Dr. Faith Rogue Social Worker Present: Amada Jupiter, LCSW Nurse Present: Carlean Purl, RN PT Present: Hosie Spangle, PT OT Present: Roney Mans, Arthur Holms, OT SLP Present: Feliberto Gottron, SLP Other (Discipline and Name): Ottie Glazier, RN [facility]) PPS Coordinator present : Tora Duck, RN, [facility]     Current Status/Progress Goal Weekly Team Focus  Medical   slow progress. participation waxes and wanes. dementia/cognitive issues are prevalent.  improve arousal, concentration  bladder rx, control diabetes   Bowel/Bladder   Pt continent of bowel. LBM 9-27. Incont. of bladder at times-urgancy. Brief inplace during day for episodes of incontinence. Condom cath HS. Timed toileting inplace  manage b/b with minimal assist  cont. timed tolieting    Swallow/Nutrition/ Hydration   assistance with opening containers, uses assistive device for utensils         ADL's   Max-Total Assist for BADL, Mod-Max for transfers  Min A transfers, Mod A for lower body dressing and toileting, Min A for bathing, grooming, cognition  BUE function, cognition (sustained attention, sequencing, initiation), motor planning, transfers, dynamic standing balance   Mobility   MinA for w/c propulsion w/ a LOT of encouragement, Mod-Max for transfers, Mod-Max for short ambulation  MinA for transfers, (S) for bed mobility, w/c propulsion, ModA for ambulation to 50'  ataxic gait, BLE strength/coordination, upright tolerance, standing balance   Communication             Safety/Cognition/ Behavioral Observations  Mod A  Min A  attention, awareness, problem solving, recall   Pain   pt complains of pain to neck at times. tylenol 650mg  prn for pain  3 or less  manage pain; medicate prior to therapy and PRN   Skin   anterior cervical incision-dermabond. red blanchable sacrum with white area over bone- foam inplace. skin tear to left hand-foam inplace; bruising to upper chest  remain free of further skin breakdown with minimal assist  assess skin q shift; turn q2hrs to relief pressure.     Rehab Goals Patient on target to meet rehab goals: Yes *See Care Plan and progress notes for long and short-term goals.  Barriers to Discharge: baseline cognitive deficits, coping issues    Possible Resolutions to Barriers:  ongoing care after dc from rehab    Discharge Planning/Teaching Needs:  likely to change d/c plan to SNF as family is unable to provide 24/7  assistance      Team Discussion:  Very grumpy in morning sessions.  Little progress with therapies.  Poor carry over of information.  Dementia is apparent.  Currently total assist overall.  BSs variable.  Team recommends SNF unless family can coordinate 24/7 physical care - SW to follow up.  Revisions to Treatment Plan:   likley change in d/c plan to snf   Continued Need for Acute Rehabilitation Level of Care: The patient requires daily medical management by a physician with specialized training in physical medicine and rehabilitation for the following conditions: Daily direction of a multidisciplinary physical rehabilitation program to ensure safe treatment while eliciting the highest outcome that is of practical value to the patient.: Yes Daily medical management of patient stability for increased activity during participation in an intensive rehabilitation regime.: Yes Daily analysis of laboratory values and/or radiology reports with any subsequent need for medication adjustment of medical intervention for : Post surgical problems;Neurological problems  Woodrow Drab 09/06/2014, 1:01 PM

## 2014-09-06 NOTE — Progress Notes (Signed)
Physical Therapy Session Note  Patient Details  Name: Javier Murillo MRN: [id number] Date of Birth: Jan 23, 1930  Today's Date: 09/06/2014 PT Individual Time: 1030-1200 PT Individual Time Calculation (min): 90 min with [employer], PT Tech as +2  Short Term Goals: Week 2:  PT Short Term Goal 1 (Week 2): Pt will roll R and L in bed req min A and verbal cues for sequencing.  PT Short Term Goal 2 (Week 2): Pt will transfer w/c to/from bed req mod A.  PT Short Term Goal 3 (Week 2): Pt will demonstrate supine to/from sit req min A PT Short Term Goal 4 (Week 2): Pt will demonstrate ambulation x 20' req mod A and LRAD.  PT Short Term Goal 5 (Week 2): Pt will demonstrate w/c propulsion with B UE/LEs x 150' req SBA.   Skilled Therapeutic Interventions/Progress Updates:    Neuromuscular Reeducation: PT instructs pt in chair push-ups: 3 x 10 reps, and during the last set he demonstrates sustained holds for up to 10 seconds at a time.  PT and recreational therapist do a dance exercise to [facility] music, including forward, side, and back stepping for improving balance req +2 assist (pt's arms on shoulders of rec therapist, while PT provides assist at gait belt. Pt req simple, one step commands.   W/C Management: PT instructs pt in w/c propulsion with B UEs/LEs x 200' req SBA and verbal cues to increase the space between himself and the w/c on the R, due to R side inattention.  PT exchanges foam cushion for a Roho cushion and adjusts inflation so pt's B ischial tuberosities sink into the cushion for relief.  During second set of w/c propulsion, pt dual tasks by telling Rec therapist a story while self propelling 150' req SBA on level surface.   Gait Training: PT instructs pt in ambulation with standard walker x 25' req mod A for balance and +2 assist for w/c follow for safety. On second set of ambulation x 25' req mod A for balance and +2 assist for w/c follow, pt dual tasks by telling rec therapist a story.  He has difficult time dual tasking and occasionally has to stop and stand to finish a thought, but this distraction while ambulation works to get pt to ambulate. Pt req a seated rest break after reporting he feels dizzy.   Therapeutic Activity: PT instructs pt in sit to stand with standard walker req min-mod A for balance and simple, one step cues for technique, w/c to loveseat req max A due to pt having difficulty turning standard walker, losing balance, and being assisted to sit on loveseat. PT instructs pt in stand-step transfer with standard walker from love seat to w/c req +2 assist, one person at gait belt, and one person assisting with controlling RW.  PT instructs pt in standard w/c to TIS w/c transfer stand-step/pivot req max A and 2nd person available for safety. Pt reports he is able to eat lunch in new w/c. Pt left tilted with quick-release belt in place and soft call light attached to him.   Pt is slowly progressing with physical therapy. Pt did well ambulating in a straight line with standard walker, but has significant difficulty managing walker with turns (ie - during transfers). Pt continues to have low endurance/low activity tolerance and impaired balance. Roho w/c cushions given to pt due to sacral decubitus stage 2 ulcer noted. RN and wound care RN examined pt's bottom and agreed to keep bandage off of bottom  so that it can dry out. Pt reports he has been dealing with this wound problem for 10 years and at one time it was infected with MRSA. PT to return this PM to ensure new TIS w/c is appropriately fit to pt for utmost comfort. Continue per PT POC.       Therapy Documentation Precautions:  Precautions Precautions: Cervical;Fall Required Braces or Orthoses: Cervical Brace Cervical Brace: Soft collar Restrictions Weight Bearing Restrictions: No Pain: Pain Assessment Pain Assessment: Faces Pain Score: 0-No pain Faces Pain Scale: Hurts little more Pain Location: Neck Pain  Onset: On-going Pain Intervention(s): Repositioned Multiple Pain Sites: Yes 2nd Pain Site Wong-Baker Pain Rating: Hurts little more Pain Type: Acute pain Pain Location: Buttocks Pain Descriptors / Indicators: Aching Pain Onset: Gradual Pain Intervention(s): Repositioned  See FIM for current functional status  Therapy/Group: Individual Therapy with Mikey CollegeKelvin, PT Tech as +2  Tyronza Happe M 09/06/2014, 10:36 AM

## 2014-09-06 NOTE — Progress Notes (Signed)
The skilled treatment note has been reviewed and SLP is in agreement.  Pina Sirianni, M.A., CCC-SLP  [phone]   

## 2014-09-06 NOTE — Progress Notes (Signed)
Social Work Patient ID: Javier Murillo, male   DOB: [birth year], 80 y.o.   MRN: [id number]   Have spoken with pt and with daughter to review team conference info and concerns.  Have explained to both that team feels limited gains have been made and pt still requiring significant assistance.  Pt will need 24/7 care upon d/c which daughter confirms family cannot provide.  Both pt and family are agreed on change of d/c plan to SNF.  Will begin process.  Kodee Ravert, LCSW

## 2014-09-06 NOTE — Significant Event (Signed)
Hypoglycemic Event  CBG: 57  Treatment: 15 GM carbohydrate snack  Symptoms: None  Follow-up CBG: Time:0725 CBG Result:106  Possible Reasons for Event: Medication regimen: adjustment of medication  Comments/MD notified:Swartz    Javier Murillo, Javier Murillo  Remember to initiate Hypoglycemia Order Set & complete

## 2014-09-07 ENCOUNTER — Inpatient Hospital Stay (HOSPITAL_COMMUNITY): Payer: Medicare Other

## 2014-09-07 ENCOUNTER — Inpatient Hospital Stay (HOSPITAL_COMMUNITY): Payer: Medicare Other | Admitting: Speech Pathology

## 2014-09-07 ENCOUNTER — Inpatient Hospital Stay (HOSPITAL_COMMUNITY): Payer: Medicare Other | Admitting: Physical Therapy

## 2014-09-07 LAB — GLUCOSE, CAPILLARY
GLUCOSE-CAPILLARY: 200 mg/dL — AB (ref 70–99)
Glucose-Capillary: 336 mg/dL — ABNORMAL HIGH (ref 70–99)
Glucose-Capillary: 232 mg/dL — ABNORMAL HIGH (ref 70–99)
Glucose-Capillary: 215 mg/dL — ABNORMAL HIGH (ref 70–99)

## 2014-09-07 MED ORDER — INSULIN GLARGINE 100 UNIT/ML ~~LOC~~ SOLN
25.0000 [IU] | Freq: Every morning | SUBCUTANEOUS | Status: DC
  Administered 2014-09-08 – 2014-09-09 (×2): 25 [IU] via SUBCUTANEOUS
  Filled 2014-09-07 (×2): qty 0.25

## 2014-09-07 NOTE — Progress Notes (Signed)
Occupational Therapy Session Note  Patient Details  Name: Javier Murillo MRN: [id number] Date of Birth: 1930/04/04  Today's Date: 09/07/2014 OT Individual Time: 2440-10271402-1442 OT Individual Time Calculation (min): 40 min    Short Term Goals: Week 2:  OT Short Term Goal 1 (Week 2): Pt will complete LB dressing with max assist OT Short Term Goal 2 (Week 2): Pt will sustain attention to task during BADL for 15 min with min cues for redirection OT Short Term Goal 3 (Week 2): Pt will complete transfer w/c<>BSC/toilet with mod assist  OT Short Term Goal 4 (Week 2): Pt will complete sit<>stand during LB dressing/bathing with mod assist  Skilled Therapeutic Interventions/Progress Updates:    Pt seen for 1:1 OT session with focus on anterior weight shifts and dexterity. Pt received sitting in w/c requiring cues of encouragement for participation as pt upset in regards to wearing quick release belt. Discussed safety plan, goals of therapy, and pt goals to facilitate active participation. Practiced donning/doffing velcro shoes with emphasis on anterior weight shift and coordination to remove and fasten straps. Pt required more than reasonable amount of time and max cues for anterior weight shift d/t fear of falling. Pt completed task with anterior weight shifts 4x total during session. Pt left sitting in w/c with quick release belt donned and all needs in reach.   Therapy Documentation Precautions:  Precautions Precautions: Cervical;Fall Required Braces or Orthoses: Cervical Brace Cervical Brace: Soft collar Restrictions Weight Bearing Restrictions: No General:   Vital Signs: Therapy Vitals Pulse Rate: 71 BP: 128/81 mmHg Pain: Pain Assessment Pain Assessment: No/denies pain  See FIM for current functional status  Therapy/Group: Individual Therapy  Daneil Danerkinson, Floyd Lusignan N 09/07/2014, 2:46 PM

## 2014-09-07 NOTE — Progress Notes (Signed)
Inpatient Diabetes Program Recommendations  AACE/ADA: New Consensus Statement on Inpatient Glycemic Control (2013)  Target Ranges:  Prepandial:   less than 140 mg/dL      Peak postprandial:   less than 180 mg/dL (1-2 hours)      Critically ill patients:  140 - 180 mg/dL    Inpatient Diabetes Program Recommendations Insulin - Basal: Lantus now at 25 units - should be okay if fasting tomorrow is not low. Correction (SSI): xxxxxxxxxxxx Insulin - Meal Coverage: the 5 units meal coverage was too much!  But pt needs some meal coverage now.  Please add meal coverage of 2-3 units tidwc. Thank you, Javier CoffinAnn Jarious Lyon, RN, CNS, Diabetes Coordinator [phone])

## 2014-09-07 NOTE — Progress Notes (Signed)
Physical Therapy Session Note  Patient Details  Name: Javier Murillo MRN: [id number] Date of Birth: 1930-01-16  Today's Date: 09/07/2014 PT Individual Time: 0930-1100 PT Individual Time Calculation (min): 90 min   Short Term Goals: Week 2:  PT Short Term Goal 1 (Week 2): Pt will roll R and L in bed req min A and verbal cues for sequencing.  PT Short Term Goal 2 (Week 2): Pt will transfer w/c to/from bed req mod A.  PT Short Term Goal 3 (Week 2): Pt will demonstrate supine to/from sit req min A PT Short Term Goal 4 (Week 2): Pt will demonstrate ambulation x 20' req mod A and LRAD.  PT Short Term Goal 5 (Week 2): Pt will demonstrate w/c propulsion with B UE/LEs x 150' req SBA.   Skilled Therapeutic Interventions/Progress Updates:    Neuromuscular Reeducation: PT instructs pt in dynamic standing balance activity with mirror feedback to compensate for proprioceptive deficits: marching in place x 10 reps B LE req min-mod A and repeated verbal cues for weight shifting away from R leg and forward.  PT instructs pt in heel raises with standard walker req min-mod A for balance x 10 reps, and repeated verbal cues to lean L and step away from the mat (pt repeatedly stepped R leg back towards the mat for balance during this activity.)  Therapeutic Activity: PT instructs pt in repeated sit to stand from mat with standard walker req repeated verbal cues for hand placement and leaning trunk forward x 10 reps req min A for balance.  PT instructs pt in mat mobility: pt rolls L and R with SBA, req min-mod A to scoot laterally in supine, breaking steps down into bridging and crunching in order to move body segmentally.  PT instructs pt in L side lie to sit edge of mat req min A and verbal cues for sequencing.  PT instructs pt in stand-step transfer with standard walker req mod A for balance, and slow, simple steps for walker movement, side stepping each leg, and standing tall. Entire transfer takes roughly 2  minutes.  PT instructs pt in standard chair with armrests transfer to w/c req standard walker and stand-step transfer, simple one-step cues req mod A.  PT instructs pt in stand-pivot transfer w/c to toilet with grab bar req min A - PT doffs pt's pants and brief.   W/C Management: PT Bufford Lope(Audra) alters pt's w/c to provide increased dump for increased sitting stability when up in chair in room.  PT instructs pt in w/c propulsion with B UE x 300' req SBA and verbal cues to swing wide in order to avoid obstacles. Pt has increased pathway deviations in w/c since seat was dumped due to slightly less contact of the castors with the ground.  PT spoke with CNA who reports she is comfortable tilting pt back for 1 minute when rounding hourly on patient.   Gait Training: PT instructs pt in ambulation with standard walker x 45' req min-mod A for balance - path includes one right turn 90 degrees into the gym. Pt demonstrates a flexed trunk posture, compensating for lack of proprioception with his vision (looking at his feet). Pt only req min A when completing step-to pattern leading with L LE, due to ability to compensate for L side weakness/poor motor control with B UE support. Pt req mod A when completing step-to pattern leading with R LE. Pt continues to req repeated verbal cues to stand tall during ambulation, but overall posture  is improved with use of standard walker, compared to wall rail and HW. Pt also demonstrates a compensated gait of rocking back on B heels in order to progress standard walker.  PT instructs pt on practice stairs with B rails req min A on ascent (3) and mod A on descent (2)  - low steps on ascent and standard height steps on descent.   Pt is continuing to progress with functional mobility, tolerating increased distance during gait and performing stairs for the first time. Today, pt demonstrated stand-step transfers with standard walker WITHOUT collapsing and being pivoted into seat for the  first time. Adjusting pt's w/c seat dump provides significantly improved seating stability and patient is still able to self propel manual w/c with B UEs. Pt's B hand movement is also improving: for the first time today, pt was able to grasp walker correctly with both hands WITHOUT PT having to verbally instruct pt to open hands and then grab walker. Pt seems like an excellent candidate for an adult foster home. Cont per PT POC.   Therapy Documentation Precautions:  Precautions Precautions: Cervical;Fall Required Braces or Orthoses: Cervical Brace Cervical Brace: Soft collar Restrictions Weight Bearing Restrictions: No Pain: Pain Assessment Pain Assessment: 0-10 Pain Score: 1  Pain Type: Acute pain Pain Location: Neck Pain Descriptors / Indicators: Aching Pain Onset: On-going Pain Intervention(s): Repositioned;Rest Multiple Pain Sites: Yes 2nd Pain Site Pain Score: 5 Pain Type: Acute pain Pain Location: Buttocks Pain Descriptors / Indicators: Aching Pain Onset: On-going Pain Intervention(s): Repositioned  See FIM for current functional status  Therapy/Group: Individual Therapy  Jilda Kress M 09/07/2014, 9:55 AM

## 2014-09-07 NOTE — Progress Notes (Signed)
Speech Language Pathology Weekly Progress Note  Patient Details  Name: Javier Murillo MRN: [id number] Date of Birth: 1930-11-24  Beginning of progress report period: August 31, 2014 End of progress report period: September 07, 2014    Short Term Goals: Week 1: SLP Short Term Goal 1 (Week 1): Patient will demonstrate sustained attention to a functional task for 10 minutes with Mod A multimodal cues for redirection.  SLP Short Term Goal 1 - Progress (Week 1): Met SLP Short Term Goal 2 (Week 1): Patient will utilize external memory aids to recall new, daily information with Min A multimodal cues.  SLP Short Term Goal 2 - Progress (Week 1): Not met SLP Short Term Goal 3 (Week 1): Patient will demonstrate functional problem solving for basic and familiar tasks with Min A multimodal cues.  SLP Short Term Goal 3 - Progress (Week 1): Not met SLP Short Term Goal 4 (Week 1): Patient will utilize the call bell to request assistance with supervision multimodal cues.  SLP Short Term Goal 4 - Progress (Week 1): Not met SLP Short Term Goal 5 (Week 1): Patient will identify 1 goal of each individual therapy (PT/OT/ST) in order to minimize frustration tolerance with Mod A multimodal cues.   SLP Short Term Goal 5 - Progress (Week 1): Not met    New Short Term Goals: Week 2: SLP Short Term Goal 1 (Week 2): Patient will identify 1 goal of each individual therapy (PT/OT/ST) in order to minimize frustration tolerance with Mod A multimodal cues.   SLP Short Term Goal 2 (Week 2): Patient will utilize the call bell to request assistance with supervision multimodal cues.  SLP Short Term Goal 3 (Week 2): Patient will demonstrate functional problem solving for basic and familiar tasks with Min A multimodal cues.  SLP Short Term Goal 4 (Week 2): Patient will utilize external memory aids to recall new, daily information with Min A multimodal cues.  SLP Short Term Goal 5 (Week 2): Patient will demonstrate sustained  attention to a functional task for 30 minutes with Min A multimodal cues for redirection.   Weekly Progress Updates: Patient has made minimal gains and has met 1 of 5 STG's this reporting period due to increased attention.  Currently, patient continues to require Mod-Max A for attention, functional problem solving with basic and familiar tasks, working memory and emergent awareness. Patient has demonstrated increased participation with therapy but continues to demonstrate decreased frustration tolerance and limited insight into goals of skilled intervention across disciplines.  Patient's discharge plan has now changed to SNF and SLP goals have been downgraded. Patient/family education ongoing. Patient would benefit from continued skilled SLP intervention to maximize his cognitive function and overall functional independence prior to discharge in order to reduce caregiver burden.    Intensity: Minumum of 1-2 x/day, 30 to 90 minutes Frequency: 5 out of 7 days Duration/Length of Stay: TBD due to SNF placement  Treatment/Interventions: Cueing hierarchy;Cognitive remediation/compensation;Environmental controls;Internal/external aids;Patient/family education;Therapeutic Activities;Functional tasks    Zuleyka Kloc, Colbert 09/07/2014, 7:17 AM

## 2014-09-07 NOTE — Progress Notes (Signed)
[place] PHYSICAL MEDICINE & REHABILITATION     PROGRESS NOTE    Subjective/Complaints: Sugars remain labile.  A  review of systems has been performed and if not noted above is otherwise negative.   Objective: Vital Signs: Blood pressure 139/52, pulse 66, temperature 97.9 F (36.6 C), temperature source Axillary, resp. rate 17, height 6' (1.829 m), weight 70.9 kg (156 lb 4.9 oz), SpO2 96.00%. No results found. No results found for this basename: WBC, HGB, HCT, PLT,  in the last 72 hours No results found for this basename: NA, K, CL, CO, GLUCOSE, BUN, CREATININE, CALCIUM,  in the last 72 hours CBG (last 3)   Recent Labs  09/06/14 2058 09/06/14 2124 09/07/14 0630  GLUCAP 47* 91 336*    Wt Readings from Last 3 Encounters:  09/05/14 70.9 kg (156 lb 4.9 oz)  08/22/14 71 kg (156 lb 8.4 oz)  08/22/14 71 kg (156 lb 8.4 oz)    Physical Exam:   General ADL Comments: CNA educated on proper set up to increase independence with self feeding  Cognition:  Cognition  Overall Cognitive Status: Impaired/Different from baseline  Orientation Level: Oriented to person;Oriented to time;Oriented to situation;Disoriented to place  Cognition  Arousal/Alertness: Awake/alert  Behavior During Therapy: WFL for tasks assessed/performed  Overall Cognitive Status: Impaired/Different from baseline    Physical Exam:    Constitutional: He appears well-developed.  HENT: dentition fair/poor Head: Normocephalic.  Eyes: EOM are normal.  Neck: Neck supple. No thyromegaly present.  Cardiovascular: Normal rate and regular rhythm.  Respiratory: Effort normal and breath sounds normal. No respiratory distress.  GI: Soft. Bowel sounds are normal. He exhibits no distension.  Neurological: He is alert.  His voice is intelligible. He provides his name and age as well as address. Follows simple commands.  He is hard of hearing. Sometimes slow to respond. Doesn't initiate much conversation.  Sensory loss to  PP/LT in hands. MMT: deltoids 4/5. Bicep/triceps 4/5. Wrist 4-, Right HI 3- to 3/5. Left HI 2+ to 3-. LE: HF 3+, KE 4-, ADF/APF 4/5. No resting tone. DTR's 1+.    Skin:  Surgical neck incision clean and dry  Psychiatric: He has a normal mood and affect. His behavior is normal     Assessment/Plan: 1. 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 Physiatrist is providing close team supervision and 24 hour management of active medical problems listed below. Physiatrist and rehab team continue to assess barriers to discharge/monitor patient progress toward functional and medical goals.     FIM: FIM - Bathing Bathing Steps Patient Completed: Chest;Abdomen;Front perineal area;Right upper leg;Left upper leg Bathing: 3: Mod-Patient completes 5-7 44f 10 parts or 50-74%  FIM - Upper Body Dressing/Undressing Upper body dressing/undressing steps patient completed: Thread/unthread right sleeve of pullover shirt/dresss;Thread/unthread left sleeve of pullover shirt/dress Upper body dressing/undressing: 3: Mod-Patient completed 50-74% of tasks FIM - Lower Body Dressing/Undressing Lower body dressing/undressing: 1: Total-Patient completed less than 25% of tasks  FIM - Hotel manager Devices: Grab bar or rail for support Toileting: 1: Total-Patient completed zero steps, helper did all 3  FIM - Diplomatic Services operational officer Devices: Grab bars Toilet Transfers: 2-From toilet/BSC: Max A (lift and lower assist);2-To toilet/BSC: Max A (lift and lower assist)  FIM - Press photographer Assistive Devices: HOB elevated;Bed rails;Arm rests Bed/Chair Transfer: 4: Supine > Sit: Min A (steadying Pt. >  75%/lift 1 leg);2: Bed > Chair or W/C: Max A (lift and lower assist)  FIM - Locomotion:  Wheelchair Distance: 200 Locomotion: Wheelchair: 5: Travels 150 ft or more: maneuvers on rugs and over door sills with supervision, cueing or coaxing FIM - Locomotion: Ambulation Locomotion: Ambulation Assistive Devices: Conservation officer, historic buildingsWalker - Standard Ambulation/Gait Assistance: 1: +2 Total assist (mod x 1 and w/c follow) Locomotion: Ambulation: 1: Two helpers  Comprehension Comprehension Mode: Auditory Comprehension: 4-Understands basic 75 - 89% of the time/requires cueing 10 - 24% of the time  Expression Expression Mode: Verbal Expression: 3-Expresses basic 50 - 74% of the time/requires cueing 25 - 50% of the time. Needs to repeat parts of sentences.  Social Interaction Social Interaction Mode: Asleep Social Interaction: 4-Interacts appropriately 75 - 89% of the time - Needs redirection for appropriate language or to initiate interaction.  Problem Solving Problem Solving Mode: Asleep Problem Solving: 3-Solves basic 50 - 74% of the time/requires cueing 25 - 49% of the time  Memory Memory Mode: Asleep Memory: 3-Recognizes or recalls 50 - 74% of the time/requires cueing 25 - 49% of the time  Medical Problem List and Plan:  1. Functional deficits secondary to C3-4 myelopathy status post decompression fusion. Central cord presentation.  2. DVT Prophylaxis/Anticoagulation: SCDs. Monitor for any signs of DVT. Dopplers negative 3. Pain Management: Hydrocodone as needed. Monitor with increased mobility  4. Mood/depression: Prozac 20 mg daily. Provide emotional support and  5. Neuropsych: This patient is capable of making decisions on his own behalf.  6. Skin/Wound Care: Routine skin checks  7. Diabetes mellitus with peripheral neuropathy.  Adjust Lantus insulin to 25 u qam.   -hold regular insulin scheduled with lunch and dinner  -changed diabeta to 10mg  qam.  -now off steroids  -rx uti  -needs hs snack  -need to avoid overtreating hypoglycemia and vice versa 8. Hypertension. Tenormin 12.5 mg  daily. Monitor with increased mobility  9. Urine: ecoli uti 100k  sens to  cipro    LOS (Days) 10 A FACE TO FACE EVALUATION WAS PERFORMED  SWARTZ,ZACHARY T 09/07/2014 9:43 AM

## 2014-09-07 NOTE — Progress Notes (Signed)
Occupational Therapy Session Note  Patient Details  Name: Javier Murillo MRN: [id number] Date of Birth: 06/26/1930  Today's Date: 09/07/2014 OT Individual Time: [id number] OT Individual Time Calculation (min): 60 min    Short Term Goals: Week 2:  OT Short Term Goal 1 (Week 2): Pt will complete LB dressing with max assist OT Short Term Goal 2 (Week 2): Pt will sustain attention to task during BADL for 15 min with min cues for redirection OT Short Term Goal 3 (Week 2): Pt will complete transfer w/c<>BSC/toilet with mod assist  OT Short Term Goal 4 (Week 2): Pt will complete sit<>stand during LB dressing/bathing with mod assist  Skilled Therapeutic Interventions/Progress Updates: ADL-retraining with focus on transfers, dynamic sitting balance, improved awareness and problem-solving, and sustained attention.   Pt received supine in bed awaiting therapist.   With min instructional cues, pt was able to roll to his left and grasp bed rails but required lifting assist to rise to sit at edge of bed and tactile cues to maintain balance.   Pt demo'd severe apraxia with transfer to w/c exhibiting increased posterior pelvic tilt and knee extension requiring max-total assist to perform stand-pivot transfer.   Pt was unable to reposition himself in his w/c or recognize need for weight-shifting and verbalizes fear of falling as limiting his initiation.   Pt was escorted to shower and completed transfer to bench using grab bars and only min-mod assist to position on bench.   After setup with shower patient remained seated and limited his performance to washing his face, stomach, chest and upper legs.   Pt was his peri-area after instructional cue and continues to required cues to advance through task.   Pt performed similar transfer back to w/c after bathing and dressed at sink with overall max assist.   Pt attempted shaving this session after setup and re-ed on use of electric razor (pt = 20%).   Pt requires setup  and supervision with self-feeding and is ineffective at using adapted silverware other than use of foam tubing over fork/spoon with instructional cues to problem-solve, initially.   Pt left in w/c with safety belt, call light and phone within reach.  RN tech notified of need for intermittent supervision and cognitive assist with self-feeding.     Therapy Documentation Precautions:  Precautions Precautions: Cervical;Fall Required Braces or Orthoses: Cervical Brace Cervical Brace: Soft collar Restrictions Weight Bearing Restrictions: No  Vital Signs: Therapy Vitals Temp: 97.9 F (36.6 C) Temp Source: Axillary Pulse Rate: 66 Resp: 17 BP: 139/52 mmHg Oxygen Therapy SpO2: 96 % O2 Device: None (Room air)  Pain: Pain Assessment Pain Assessment: No/denies pain  See FIM for current functional status  Therapy/Group: Individual Therapy  Nissan Frazzini 09/07/2014, 9:16 AM

## 2014-09-07 NOTE — Progress Notes (Signed)
Hypoglycemic Event  CBG: 47  Treatment: 15 GM carbohydrate snack  Symptoms: None  Follow-up CBG: Time: 2124 CBG Result: 91  Possible Reasons for Event: Unknown  Comments/MD notified:Dan Angiulli, PA   Javier Murillo A  Remember to initiate Hypoglycemia Order Set & complete

## 2014-09-07 NOTE — Progress Notes (Signed)
Speech Language Pathology Daily Session Note  Patient Details  Name: Javier Murillo MRN: [id number] Date of Birth: Apr 13, 1930  Today's Date: 09/07/2014 SLP Individual Time: 1300-1400 SLP Individual Time Calculation (min): 60 min  Short Term Goals: Week 2: SLP Short Term Goal 1 (Week 2): Patient will identify 1 goal of each individual therapy (PT/OT/ST) in order to minimize frustration tolerance with Mod A multimodal cues.   SLP Short Term Goal 2 (Week 2): Patient will utilize the call bell to request assistance with supervision multimodal cues.  SLP Short Term Goal 3 (Week 2): Patient will demonstrate functional problem solving for basic and familiar tasks with Min A multimodal cues.  SLP Short Term Goal 4 (Week 2): Patient will utilize external memory aids to recall new, daily information with Min A multimodal cues.  SLP Short Term Goal 5 (Week 2): Patient will demonstrate sustained attention to a functional task for 30 minutes with Min A multimodal cues for redirection.   Skilled Therapeutic Interventions: Skilled treatment session focused on addressing cognitive-linguistic goals. SLP facilitated session by providing Max assist multimodal cues for functional problem solving and increased awareness of current physical and cognitive impairments. Patient unable to divide attention to conversation and functional problem solving task and required Max assist multimodal cues to alternate attention. Patient verbalized minimal understanding of purposes of therapies despite Max assist cues for comprehension with written aid.   SLP also facilitated session with Max instructional cues to correctly utilize TV remote control.  Continue with current plan of care.   FIM:  Comprehension Comprehension Mode: Auditory Comprehension: 3-Understands basic 50 - 74% of the time/requires cueing 25 - 50%  of the time Expression Expression Mode: Verbal Expression: 4-Expresses basic 75 - 89% of the time/requires  cueing 10 - 24% of the time. Needs helper to occlude trach/needs to repeat words. Social Interaction Social Interaction: 3-Interacts appropriately 50 - 74% of the time - May be physically or verbally inappropriate. Problem Solving Problem Solving: 2-Solves basic 25 - 49% of the time - needs direction more than half the time to initiate, plan or complete simple activities Memory Memory: 3-Recognizes or recalls 50 - 74% of the time/requires cueing 25 - 49% of the time  Pain Pain Assessment Pain Assessment: No/denies pain  Therapy/Group: Individual Therapy  Charlane FerrettiMelissa Jemal Miskell, M.A., CCC-SLP [phone]  Surabhi Gadea 09/07/2014, 1:29 PM

## 2014-09-08 ENCOUNTER — Inpatient Hospital Stay (HOSPITAL_COMMUNITY): Payer: Medicare Other | Admitting: Physical Therapy

## 2014-09-08 LAB — GLUCOSE, CAPILLARY
GLUCOSE-CAPILLARY: 279 mg/dL — AB (ref 70–99)
GLUCOSE-CAPILLARY: 354 mg/dL — AB (ref 70–99)
Glucose-Capillary: 110 mg/dL — ABNORMAL HIGH (ref 70–99)
Glucose-Capillary: 205 mg/dL — ABNORMAL HIGH (ref 70–99)

## 2014-09-08 NOTE — Progress Notes (Signed)
Physical Therapy Session Note  Patient Details  Name: Javier Murillo MRN: [id number] Date of Birth: 06-Jan-1930  Today's Date: 09/08/2014 PT Individual Time: 0800-0900 PT Individual Time Calculation (min): 60 min   Short Term Goals: Week 2:  PT Short Term Goal 1 (Week 2): Pt will roll R and L in bed req min A and verbal cues for sequencing.  PT Short Term Goal 2 (Week 2): Pt will transfer w/c to/from bed req mod A.  PT Short Term Goal 3 (Week 2): Pt will demonstrate supine to/from sit req min A PT Short Term Goal 4 (Week 2): Pt will demonstrate ambulation x 20' req mod A and LRAD.  PT Short Term Goal 5 (Week 2): Pt will demonstrate w/c propulsion with B UE/LEs x 150' req SBA.   Skilled Therapeutic Interventions/Progress Updates:    Therapeutic Activity: PT instructs pt to roll L in bed req SBA, L side lie to sit edge of bed req min A, sit to stand with standard walker req min-mod A, stand-step transfer with standard walker req mod A for balance. PT instructs pt in dressing upper body and lower body while sitting edge of bed req min A for balance and mod A for donning shirt and tot A for lower body dressing (pants, socks, and shoes).  Neuromuscular Reeducation: PT instructs pt in TUG and pt scores: 189 seconds first attempt, 129 seconds second attempt, and 110 seconds third attempt. Pt req min-mod A with standard walker to complete TUG.   Pt's balance, gait, safety with functional mobility, and activity tolerance are all improving daily. Pt continues to be significantly limited due to impaired balance, B hand dysfunction, poor proprioception in feet/hands, and ataxic gait and 24 hour supervision at discharge due to cognitive deficits is still appropriate. Continue per PT POC.   Therapy Documentation Precautions:  Precautions Precautions: Cervical;Fall Required Braces or Orthoses: Cervical Brace Cervical Brace: Soft collar Restrictions Weight Bearing Restrictions: No Pain: Pain  Assessment Pain Assessment: 0-10 Pain Score: 1  Pain Type: Surgical pain Pain Location: Neck Pain Descriptors / Indicators: Aching Pain Onset: On-going Pain Intervention(s): Emotional support Multiple Pain Sites: No Balance: Balance Balance Assessed: Yes Standardized Balance Assessment Standardized Balance Assessment: Timed Up and Go Test Timed Up and Go Test TUG: Normal TUG Normal TUG (seconds): 110  See FIM for current functional status  Therapy/Group: Individual Therapy  Marny Smethers M 09/08/2014, 8:32 AM

## 2014-09-08 NOTE — Progress Notes (Signed)
[place] PHYSICAL MEDICINE & REHABILITATION     PROGRESS NOTE    Subjective/Complaints: Alert, no complaints today. Sugar 110 today!!! A  review of systems has been performed and if not noted above is otherwise negative.   Objective: Vital Signs: Blood pressure 154/64, pulse 52, temperature 97.8 F (36.6 C), temperature source Oral, resp. rate 17, height 6' (1.829 m), weight 70.9 kg (156 lb 4.9 oz), SpO2 99.00%. No results found. No results found for this basename: WBC, HGB, HCT, PLT,  in the last 72 hours No results found for this basename: NA, K, CL, CO, GLUCOSE, BUN, CREATININE, CALCIUM,  in the last 72 hours CBG (last 3)   Recent Labs  09/07/14 1622 09/07/14 2107 09/08/14 0636  GLUCAP 232* 215* 110*    Wt Readings from Last 3 Encounters:  09/05/14 70.9 kg (156 lb 4.9 oz)  08/22/14 71 kg (156 lb 8.4 oz)  08/22/14 71 kg (156 lb 8.4 oz)    Physical Exam:   General ADL Comments: CNA educated on proper set up to increase independence with self feeding  Cognition:  Cognition  Overall Cognitive Status: Impaired/Different from baseline  Orientation Level: Oriented to person;Oriented to time;Oriented to situation;Disoriented to place  Cognition  Arousal/Alertness: Awake/alert  Behavior During Therapy: WFL for tasks assessed/performed  Overall Cognitive Status: Impaired/Different from baseline    Physical Exam:    Constitutional: He appears well-developed.  HENT: dentition fair/poor Head: Normocephalic.  Eyes: EOM are normal.  Neck: Neck supple. No thyromegaly present.  Cardiovascular: Normal rate and regular rhythm.  Respiratory: Effort normal and breath sounds normal. No respiratory distress.  GI: Soft. Bowel sounds are normal. He exhibits no distension.  Neurological: He is alert.  His voice is intelligible. Quicker to respond today. Doesn't initiate much conversation.  Sensory loss to PP/LT in hands. MMT: deltoids 4/5. Bicep/triceps 4/5. Wrist 4-, Right HI 3-  to 3/5. Left HI 2+ to 3-. LE: HF 3+, KE 4-, ADF/APF 4/5. No resting tone. DTR's 1+.    Skin:  Surgical neck incision clean and dry  Psychiatric: He has a normal mood and affect. His behavior is normal. pleasant    Assessment/Plan: 1. 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 Physiatrist is providing close team supervision and 24 hour management of active medical problems listed below. Physiatrist and rehab team continue to assess barriers to discharge/monitor patient progress toward functional and medical goals.     FIM: FIM - Bathing Bathing Steps Patient Completed: Chest;Abdomen;Front perineal area;Right upper leg;Left upper leg Bathing: 3: Mod-Patient completes 5-7 3344f 10 parts or 50-74%  FIM - Upper Body Dressing/Undressing Upper body dressing/undressing steps patient completed: Thread/unthread right sleeve of pullover shirt/dresss;Thread/unthread left sleeve of pullover shirt/dress Upper body dressing/undressing: 3: Mod-Patient completed 50-74% of tasks FIM - Lower Body Dressing/Undressing Lower body dressing/undressing: 1: Total-Patient completed less than 25% of tasks  FIM - Hotel managerToileting Toileting Assistive Devices: Grab bar or rail for support Toileting: 1: Total-Patient completed zero steps, helper did all 3  FIM - Diplomatic Services operational officerToilet Transfers Toilet Transfers Assistive Devices: Grab bars Toilet Transfers: 4-To toilet/BSC: Min A (steadying Pt. > 75%)  FIM - BankerBed/Chair Transfer Bed/Chair Transfer Assistive Devices: Walker;Arm rests;Orthosis (soft collar) Bed/Chair Transfer: 4: Supine > Sit: Min A (steadying Pt. > 75%/lift 1 leg);4: Sit > Supine: Min A (steadying pt. > 75%/lift 1 leg);3: Bed > Chair or W/C: Mod A (lift  or lower assist);3: Chair or W/C > Bed: Mod A (lift or lower assist)  FIM - Locomotion: Wheelchair Distance:  300 Locomotion: Wheelchair: 5: Travels 150 ft or more: maneuvers on rugs and over door sills with supervision, cueing or coaxing FIM - Locomotion: Ambulation Locomotion: Ambulation Assistive Devices: Conservation officer, historic buildings Ambulation/Gait Assistance: 1: +2 Total assist (w/c follow x 1 and mod A x 1) Locomotion: Ambulation: 1: Two helpers  Comprehension Comprehension Mode: Auditory Comprehension: 3-Understands basic 50 - 74% of the time/requires cueing 25 - 50%  of the time  Expression Expression Mode: Verbal Expression: 4-Expresses basic 75 - 89% of the time/requires cueing 10 - 24% of the time. Needs helper to occlude trach/needs to repeat words.  Social Interaction Social Interaction Mode: Asleep Social Interaction: 3-Interacts appropriately 50 - 74% of the time - May be physically or verbally inappropriate.  Problem Solving Problem Solving Mode: Asleep Problem Solving: 2-Solves basic 25 - 49% of the time - needs direction more than half the time to initiate, plan or complete simple activities  Memory Memory Mode: Asleep Memory: 3-Recognizes or recalls 50 - 74% of the time/requires cueing 25 - 49% of the time  Medical Problem List and Plan:  1. Functional deficits secondary to C3-4 myelopathy status post decompression fusion. Central cord presentation.  2. DVT Prophylaxis/Anticoagulation: SCDs. Monitor for any signs of DVT. Dopplers negative 3. Pain Management: Hydrocodone as needed. Monitor with increased mobility  4. Mood/depression: Prozac 20 mg daily. Provide emotional support and  5. Neuropsych: This patient is capable of making decisions on his own behalf.  6. Skin/Wound Care: Routine skin checks  7. Diabetes mellitus with peripheral neuropathy.  Sugar better this am  -Adjusted Lantus insulin to 25 u qam.   -holding regular insulin scheduled with lunch and dinner  -changed diabeta to 10mg  qam.  -now off steroids  -rx uti  - hs snack  -need to avoid overtreating  hypoglycemia and vice versa 8. Hypertension. Tenormin 12.5 mg daily. Monitor with increased mobility  9. Urine: ecoli uti rx'ed with  cipro    LOS (Days) 11 A FACE TO FACE EVALUATION WAS PERFORMED  Aristeo Hankerson T 09/08/2014 9:16 AM

## 2014-09-09 ENCOUNTER — Inpatient Hospital Stay (HOSPITAL_COMMUNITY): Payer: Medicare Other

## 2014-09-09 LAB — GLUCOSE, CAPILLARY
GLUCOSE-CAPILLARY: 378 mg/dL — AB (ref 70–99)
Glucose-Capillary: 83 mg/dL (ref 70–99)
Glucose-Capillary: 176 mg/dL — ABNORMAL HIGH (ref 70–99)
Glucose-Capillary: 227 mg/dL — ABNORMAL HIGH (ref 70–99)

## 2014-09-09 MED ORDER — INSULIN GLARGINE 100 UNIT/ML ~~LOC~~ SOLN
30.0000 [IU] | Freq: Every morning | SUBCUTANEOUS | Status: DC
  Administered 2014-09-10 – 2014-09-11 (×2): 30 [IU] via SUBCUTANEOUS
  Filled 2014-09-09 (×2): qty 0.3

## 2014-09-09 NOTE — Progress Notes (Signed)
[place] PHYSICAL MEDICINE & REHABILITATION     PROGRESS NOTE    Subjective/Complaints: No new issues. Patient sleeping comfortably upon my arrival A  review of systems has been performed and if not noted above is otherwise negative.   Objective: Vital Signs: Blood pressure 151/66, pulse 52, temperature 98 F (36.7 C), temperature source Oral, resp. rate 18, height 6' (1.829 m), weight 70.9 kg (156 lb 4.9 oz), SpO2 99.00%. No results found. No results found for this basename: WBC, HGB, HCT, PLT,  in the last 72 hours No results found for this basename: NA, K, CL, CO, GLUCOSE, BUN, CREATININE, CALCIUM,  in the last 72 hours CBG (last 3)   Recent Labs  09/08/14 1147 09/08/14 1611 09/08/14 2020  GLUCAP 279* 354* 205*    Wt Readings from Last 3 Encounters:  09/05/14 70.9 kg (156 lb 4.9 oz)  08/22/14 71 kg (156 lb 8.4 oz)  08/22/14 71 kg (156 lb 8.4 oz)    Physical Exam:   General ADL Comments: CNA educated on proper set up to increase independence with self feeding  Cognition:  Cognition  Overall Cognitive Status: Impaired/Different from baseline  Orientation Level: Oriented to person;Oriented to time;Oriented to situation;Disoriented to place  Cognition  Arousal/Alertness: Awake/alert  Behavior During Therapy: WFL for tasks assessed/performed  Overall Cognitive Status: Impaired/Different from baseline    Physical Exam:    Constitutional: He appears well-developed.  HENT: dentition fair/poor Head: Normocephalic.  Eyes: EOM are normal.  Neck: Neck supple. No thyromegaly present.  Cardiovascular: Normal rate and regular rhythm.  Respiratory: Effort normal and breath sounds normal. No respiratory distress.  GI: Soft. Bowel sounds are normal. He exhibits no distension.  Neurological: He is alert.  His voice is intelligible. Quicker to respond today. Doesn't initiate much conversation.  Sensory loss to PP/LT in hands. MMT: deltoids 4/5. Bicep/triceps 4/5. Wrist 4-,  Right HI 3- to 3/5. Left HI 2+ to 3-. LE: HF 3+, KE 4-, ADF/APF 4/5. No resting tone. DTR's 1+.    Skin:  Surgical neck incision clean and dry  Psychiatric: He has a normal mood and affect. His behavior is normal. pleasant    Assessment/Plan: 1. 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 Physiatrist is providing close team supervision and 24 hour management of active medical problems listed below. Physiatrist and rehab team continue to assess barriers to discharge/monitor patient progress toward functional and medical goals.     FIM: FIM - Bathing Bathing Steps Patient Completed: Chest;Abdomen;Front perineal area;Right upper leg;Left upper leg Bathing: 3: Mod-Patient completes 5-7 2223f 10 parts or 50-74%  FIM - Upper Body Dressing/Undressing Upper body dressing/undressing steps patient completed: Thread/unthread right sleeve of pullover shirt/dresss;Pull shirt over trunk Upper body dressing/undressing: 3: Mod-Patient completed 50-74% of tasks FIM - Lower Body Dressing/Undressing Lower body dressing/undressing steps patient completed: Pull pants up/down Lower body dressing/undressing: 1: Total-Patient completed less than 25% of tasks  FIM - Hotel managerToileting Toileting Assistive Devices: Grab bar or rail for support Toileting: 1: Total-Patient completed zero steps, helper did all 3  FIM - Diplomatic Services operational officerToilet Transfers Toilet Transfers Assistive Devices: Grab bars Toilet Transfers: 4-To toilet/BSC: Min A (steadying Pt. > 75%)  FIM - BankerBed/Chair Transfer Bed/Chair Transfer Assistive Devices: Walker;Arm rests;Orthosis Bed/Chair Transfer: 3: Supine > Sit: Mod A (lifting assist/Pt. 50-74%/lift 2 legs;3: Bed > Chair or W/C: Mod A (lift or lower assist)  FIM -  Locomotion: Wheelchair Distance: 200 Locomotion: Wheelchair: 5: Travels 150 ft or more:  maneuvers on rugs and over door sills with supervision, cueing or coaxing FIM - Locomotion: Ambulation Locomotion: Ambulation Assistive Devices: Environmental consultant - Standard Ambulation/Gait Assistance: 3: Mod assist Locomotion: Ambulation: 1: Travels less than 50 ft with moderate assistance (Pt: 50 - 74%)  Comprehension Comprehension Mode: Auditory Comprehension: 3-Understands basic 50 - 74% of the time/requires cueing 25 - 50%  of the time  Expression Expression Mode: Verbal Expression: 3-Expresses basic 50 - 74% of the time/requires cueing 25 - 50% of the time. Needs to repeat parts of sentences.  Social Interaction Social Interaction Mode: Asleep Social Interaction: 3-Interacts appropriately 50 - 74% of the time - May be physically or verbally inappropriate.  Problem Solving Problem Solving Mode: Asleep Problem Solving: 3-Solves basic 50 - 74% of the time/requires cueing 25 - 49% of the time  Memory Memory Mode: Asleep Memory: 3-Recognizes or recalls 50 - 74% of the time/requires cueing 25 - 49% of the time  Medical Problem List and Plan:  1. Functional deficits secondary to C3-4 myelopathy status post decompression fusion. Central cord presentation.  2. DVT Prophylaxis/Anticoagulation: SCDs. Monitor for any signs of DVT. Dopplers negative 3. Pain Management: Hydrocodone as needed. Monitor with increased mobility  4. Mood/depression: Prozac 20 mg daily. Provide emotional support and  5. Neuropsych: This patient is capable of making decisions on his own behalf.  6. Skin/Wound Care: Routine skin checks  7. Diabetes mellitus with peripheral neuropathy.     -Adjust lantus to 30u qam. .   -holding regular insulin scheduled with lunch and dinner  -changed diabeta to 10mg  qam.  -now off steroids  -rx uti  - hs snack  -need to avoid overtreating hypoglycemia and vice versa 8. Hypertension. Tenormin 12.5 mg daily. Monitor with increased mobility  9. Urine: ecoli uti rx'ed with  cipro     LOS (Days) 12 A FACE TO FACE EVALUATION WAS PERFORMED  Arnesha Schiraldi T 09/09/2014 8:11 AM

## 2014-09-09 NOTE — Progress Notes (Signed)
Physical Therapy Session Note  Patient Details  Name: Javier Murillo MRN: [id number] Date of Birth: 1930-11-08  Today's Date: 09/09/2014 PT Individual Time: [id number] PT Individual Time Calculation (min): 58 min   Short Term Goals: Week 2:  PT Short Term Goal 1 (Week 2): Pt will roll R and L in bed req min A and verbal cues for sequencing.  PT Short Term Goal 2 (Week 2): Pt will transfer w/c to/from bed req mod A.  PT Short Term Goal 3 (Week 2): Pt will demonstrate supine to/from sit req min A PT Short Term Goal 4 (Week 2): Pt will demonstrate ambulation x 20' req mod A and LRAD.  PT Short Term Goal 5 (Week 2): Pt will demonstrate w/c propulsion with B UE/LEs x 150' req SBA.   Skilled Therapeutic Interventions/Progress Updates:    Pt received seated in w/c, agreeable to participate in therapy. Session focused on functional mobility, sit<>stands. Throughout session pt very tangential, not easily redirected back to therapy. Pt noted to be in better spirits than last time this therapist treated him - without prompting pt volunteered that he had been in a bad mood last time he saw me (5 days ago, showing improved memory) and described himself as a "hornets nest" that day. Pt stood w/ STEDI w/ MinGuard A progressing to SBA from therapist. Able to maintain standing for ~2-3 minutes at a time if attending to conversation or other task. Blocked practice sit<>stands from recliner x10 w/ STEDI lift and MinGuard-CGA. Pt left seated in w/c w/QRB on w/ all needs within reach. Voiced satisfaction w/ ROHO cushion and [employer] back.    Therapy Documentation Precautions:  Precautions Precautions: Cervical;Fall Required Braces or Orthoses: Cervical Brace Cervical Brace: Soft collar Restrictions Weight Bearing Restrictions: No General:   Vital Signs: Therapy Vitals Temp: 98 F (36.7 C) Temp Source: Oral Pulse Rate: 52 Resp: 18 BP: 151/66 mmHg Patient Position (if appropriate): Lying Oxygen  Therapy SpO2: 99 % O2 Device: None (Room air) Pain:   Mobility:   Locomotion :    Trunk/Postural Assessment :    Balance:   Exercises:   Other Treatments:    See FIM for current functional status  Therapy/Group: Individual Therapy  Javier Murillo, Javier Murillo Javier Murillo, PT, DPT 09/09/2014, 7:54 AM

## 2014-09-10 ENCOUNTER — Inpatient Hospital Stay (HOSPITAL_COMMUNITY): Payer: Medicare Other

## 2014-09-10 ENCOUNTER — Inpatient Hospital Stay (HOSPITAL_COMMUNITY): Payer: Medicare Other | Admitting: Speech Pathology

## 2014-09-10 LAB — GLUCOSE, CAPILLARY
Glucose-Capillary: 102 mg/dL — ABNORMAL HIGH (ref 70–99)
Glucose-Capillary: 343 mg/dL — ABNORMAL HIGH (ref 70–99)
Glucose-Capillary: 331 mg/dL — ABNORMAL HIGH (ref 70–99)
Glucose-Capillary: 113 mg/dL — ABNORMAL HIGH (ref 70–99)

## 2014-09-10 NOTE — Progress Notes (Signed)
[place] PHYSICAL MEDICINE & REHABILITATION     PROGRESS NOTE    Subjective/Complaints: Irritated that nurses hadn't gotten him up yet A  review of systems has been performed and if not noted above is otherwise negative.   Objective: Vital Signs: Blood pressure 140/66, pulse 60, temperature 98.3 F (36.8 C), temperature source Oral, resp. rate 18, height 6' (1.829 m), weight 70.9 kg (156 lb 4.9 oz), SpO2 96.00%. No results found. No results found for this basename: WBC, HGB, HCT, PLT,  in the last 72 hours No results found for this basename: NA, K, CL, CO, GLUCOSE, BUN, CREATININE, CALCIUM,  in the last 72 hours CBG (last 3)   Recent Labs  09/09/14 1648 09/09/14 2038 09/10/14 0715  GLUCAP 378* 227* 102*    Wt Readings from Last 3 Encounters:  09/05/14 70.9 kg (156 lb 4.9 oz)  08/22/14 71 kg (156 lb 8.4 oz)  08/22/14 71 kg (156 lb 8.4 oz)    Physical Exam:   General ADL Comments: CNA educated on proper set up to increase independence with self feeding  Cognition:  Cognition  Overall Cognitive Status: Impaired/Different from baseline  Orientation Level: Oriented to person;Oriented to time;Oriented to situation;Disoriented to place  Cognition  Arousal/Alertness: Awake/alert  Behavior During Therapy: WFL for tasks assessed/performed  Overall Cognitive Status: Impaired/Different from baseline    Physical Exam:    Constitutional: He appears well-developed.  HENT: dentition fair/poor Head: Normocephalic.  Eyes: EOM are normal.  Neck: Neck supple. No thyromegaly present.  Cardiovascular: Normal rate and regular rhythm.  Respiratory: Effort normal and breath sounds normal. No respiratory distress.  GI: Soft. Bowel sounds are normal. He exhibits no distension.  Neurological: He is alert.  His voice is intelligible. Quicker to respond today. Doesn't initiate much conversation.  Sensory loss to PP/LT in hands. MMT: deltoids 4/5. Bicep/triceps 4/5. Wrist 4-, Right HI 3-  to 3/5. Left HI 2+ to 3-. LE: HF 3+, KE 4-, ADF/APF 4/5. No resting tone. DTR's 1+.    Skin:  Surgical neck incision clean and dry  Psychiatric: He has a normal mood and affect. His behavior is normal. pleasant    Assessment/Plan: 1. 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 Physiatrist is providing close team supervision and 24 hour management of active medical problems listed below. Physiatrist and rehab team continue to assess barriers to discharge/monitor patient progress toward functional and medical goals.     FIM: FIM - Bathing Bathing Steps Patient Completed: Chest;Abdomen;Front perineal area;Right upper leg;Left upper leg Bathing: 3: Mod-Patient completes 5-7 [id number] 10 parts or 50-74%  FIM - Upper Body Dressing/Undressing Upper body dressing/undressing steps patient completed: Thread/unthread right sleeve of pullover shirt/dresss;Pull shirt over trunk Upper body dressing/undressing: 3: Mod-Patient completed 50-74% of tasks FIM - Lower Body Dressing/Undressing Lower body dressing/undressing steps patient completed: Pull pants up/down Lower body dressing/undressing: 1: Total-Patient completed less than 25% of tasks  FIM - Hotel managerToileting Toileting Assistive Devices: Grab bar or rail for support Toileting: 1: Total-Patient completed zero steps, helper did all 3  FIM - Diplomatic Services operational officerToilet Transfers Toilet Transfers Assistive Devices: Grab bars Toilet Transfers: 4-To toilet/BSC: Min A (steadying Pt. > 75%)  FIM - BankerBed/Chair Transfer Bed/Chair Transfer Assistive Devices: Walker;Arm rests;Orthosis Bed/Chair Transfer: 3: Chair or W/C > Bed: Mod A (lift or lower assist);3: Bed > Chair or W/C: Mod A (lift or lower assist)  FIM -  Locomotion: Wheelchair Distance: 200 Locomotion: Wheelchair: 0: Activity did not occur FIM - Locomotion:  Ambulation Locomotion: Ambulation Assistive Devices: Conservation officer, historic buildings Ambulation/Gait Assistance: 3: Mod assist Locomotion: Ambulation: 0: Activity did not occur  Comprehension Comprehension Mode: Auditory Comprehension: 3-Understands basic 50 - 74% of the time/requires cueing 25 - 50%  of the time  Expression Expression Mode: Verbal Expression: 3-Expresses basic 50 - 74% of the time/requires cueing 25 - 50% of the time. Needs to repeat parts of sentences.  Social Interaction Social Interaction Mode: Asleep Social Interaction: 3-Interacts appropriately 50 - 74% of the time - May be physically or verbally inappropriate.  Problem Solving Problem Solving Mode: Asleep Problem Solving: 3-Solves basic 50 - 74% of the time/requires cueing 25 - 49% of the time  Memory Memory Mode: Asleep Memory: 3-Recognizes or recalls 50 - 74% of the time/requires cueing 25 - 49% of the time  Medical Problem List and Plan:  1. Functional deficits secondary to C3-4 myelopathy status post decompression fusion. Central cord presentation.  2. DVT Prophylaxis/Anticoagulation: SCDs. Monitor for any signs of DVT. Dopplers negative 3. Pain Management: Hydrocodone as needed. Monitor with increased mobility  4. Mood/depression: Prozac 20 mg daily. Provide emotional support and  5. Neuropsych: This patient is capable of making decisions on his own behalf.  6. Skin/Wound Care: Routine skin checks  7. Diabetes mellitus with peripheral neuropathy.   Some improvement  -Adjusted lantus to 30u qam.    -stopped regular insulin scheduled with lunch and dinner  -changed diabeta to 10mg  qam.  -now off steroids  -rx uti  - hs snack  -need to avoid overtreating hypoglycemia and vice versa 8. Hypertension. Tenormin 12.5 mg daily. Monitor with increased mobility  9. Urine: ecoli uti rx'ed with  cipro    LOS (Days) 13 A FACE TO FACE EVALUATION WAS PERFORMED  Annie Saephan T 09/10/2014 7:58 AM

## 2014-09-10 NOTE — Progress Notes (Signed)
The skilled treatment note has been reviewed and SLP is in agreement.  Parlee Amescua, M.A., CCC-SLP  [phone]   

## 2014-09-10 NOTE — Progress Notes (Signed)
Occupational Therapy Session Note  Patient Details  Name: Javier Murillo MRN: [id number] Date of Birth: Jan 01, 1930  Today's Date: 09/10/2014 OT Individual Time: 0728-0828 OT Individual Time Calculation (min): 60 min    Short Term Goals: Week 2:  OT Short Term Goal 1 (Week 2): Pt will complete LB dressing with max assist OT Short Term Goal 2 (Week 2): Pt will sustain attention to task during BADL for 15 min with min cues for redirection OT Short Term Goal 3 (Week 2): Pt will complete transfer w/c<>BSC/toilet with mod assist  OT Short Term Goal 4 (Week 2): Pt will complete sit<>stand during LB dressing/bathing with mod assist  Skilled Therapeutic Interventions/Progress Updates: ADL-retraining with focus on improved functional grasp during BADL, bed mobility, transfers, static standing balance, and problem-solving.   Pt received supine in bed reporting need for bowel care although unaware of any BM.   Pt completed bed mobility with only min assist this session and min verbal cues after setup of bed (HOB elevated, max firmness dialed with air mattress).   Pt completed stand-pivot transfer to w/c with only lowering assist required d/t pt misjudging distance prior to cushion prior to sitting.   Pt was escorted to bathroom and completed similar transfer to toilet.   Pt sat on toilet approx 15 minutes while attempting to pass BM.    OT requested RN assist as pt had fully formed hardened feces he was unable to pass.   With digital stim assist, pt passed BM and ambulated to tub bench with mod assist for gait.   Pt completed showering with max assist d/t fatigue from BM and required total assist to don lower body clothing at sink, although standing supported for 30 seconds at sink to allow assist with pulling up pants.    Pt completed self-feeding with setup to open containers and to provide red foam tubing for fork.   Pt rejected use of bendable good grips utensil in favor of modified standard fork.   Pt left  in w/c completed breakfast with call light and phone within reach.     Therapy Documentation Precautions:  Precautions Precautions: Cervical;Fall Required Braces or Orthoses: Cervical Brace Cervical Brace: Soft collar Restrictions Weight Bearing Restrictions: No  Vital Signs: Therapy Vitals Temp: 98.3 F (36.8 C) Temp Source: Oral Pulse Rate: 60 Resp: 18 BP: 140/66 mmHg Patient Position (if appropriate): Lying Oxygen Therapy SpO2: 96 % O2 Device: None (Room air)  Pain: Pain Assessment Pain Assessment: No/denies pain  See FIM for current functional status  Therapy/Group: Individual Therapy  Sarenity Ramaker 09/10/2014, 8:30 AM

## 2014-09-10 NOTE — Progress Notes (Signed)
Physical Therapy Session Note  Patient Details  Name: Javier Murillo MRN: [id number] Date of Birth: 01-Aug-1930  Today's Date: 09/10/2014 PT Individual Time: 0900-1000 PT Individual Time Calculation (min): 60 min  Session 2 Time: 1430-1535 Time Calculation (min): 65 min  Short Term Goals: Week 2:  PT Short Term Goal 1 (Week 2): Pt will roll R and L in bed req min A and verbal cues for sequencing.  PT Short Term Goal 2 (Week 2): Pt will transfer w/c to/from bed req mod A.  PT Short Term Goal 3 (Week 2): Pt will demonstrate supine to/from sit req min A PT Short Term Goal 4 (Week 2): Pt will demonstrate ambulation x 20' req mod A and LRAD.  PT Short Term Goal 5 (Week 2): Pt will demonstrate w/c propulsion with B UE/LEs x 150' req SBA.   Skilled Therapeutic Interventions/Progress Updates:    Session 1: Pt received seated in w/c, agreeable to participate in therapy. Propelled w/c 150' to rehab gym w/ supervision and lots of encouragement. SPT w/ standard walker, then ambulation w/ standard walker 50' through rehab gym w/ several obstacles. In standing reaching L/R at mod LOS w/ Min-ModA to remain standing. Foot taps on 5 inch step w/ visual targets, min-mod cueing to remain on task and maintain balance. SPT back to w/c w/ standard walker and ModA, propelled w/c back to room w/ supervision. Pt left seated in w/c w/ QRB on w/ all needs within reahc.   Session 2: Pt received seated in w/c, agreeable to participate in therapy. Pt had slid forward in wheelchair despite chair having been dumped. Pt able to boost and scoot hips back in chair w/ MinA. Pt's son in law present for entire session, interacted appropriately w/ pt and provided motivation. Difficulty w/ sit<>stand this session compared to last despite different cueing and hand placement, pt w/ strong posterior lean on standing, unable to shift weight forward. Pt transferred w/c>mat w/ STEDI lift, MinA to come to standing w/ STEDI. x15 sit<>stands  w/ STEDI w/ cueing to sit slowly and shift weight forward. Worked on standing and seated balance in STEDI lift w/ reaching and tossing rings and horseshoes at mod LOS while standing in STEDI and seated on high perch. Performed SPT mat>w/c w/ MaxA, max verbal cueing for sequencing. Propelled w/c back to room w/ supervision. Pt stood w/ STEDI lift for therapist to reflate ROHO cushion to ensure proper pressure relief.  Patient making steady progress w/ therapy, however demonstrated variable performance especially from AM to PM sessions. Pt still has episodes of agitation, especially in regards to quick release belt placement when pt is alone in room. Pt will required 24 hr care at discharge and is appropriate for SNF placement if family is unable to provide this.    Therapy Documentation Precautions:  Precautions Precautions: Cervical;Fall Required Braces or Orthoses: Cervical Brace Cervical Brace: Soft collar Restrictions Weight Bearing Restrictions: No General:   Vital Signs: Therapy Vitals Temp: 98.3 F (36.8 C) Temp Source: Oral Pulse Rate: 60 Resp: 18 BP: 140/66 mmHg Patient Position (if appropriate): Lying Oxygen Therapy SpO2: 96 % O2 Device: None (Room air) Pain:   Mobility:   Locomotion :    Trunk/Postural Assessment :    Balance:   Exercises:   Other Treatments:    See FIM for current functional status  Therapy/Group: Individual Therapy  Hosie SpangleGodfrey, Lorraine Cimmino Hosie SpangleJess Keriann Rankin, PT, DPT 09/10/2014, 7:45 AM

## 2014-09-10 NOTE — Progress Notes (Signed)
Speech Language Pathology Daily Session Note  Patient Details  Name: Javier Murillo MRN: [id number] Date of Birth: Dec 11, 1929  Today's Date: 09/10/2014 SLP Individual Time: 1030-1130 SLP Individual Time Calculation (min): 60 min  Short Term Goals: Week 2: SLP Short Term Goal 1 (Week 2): Patient will identify 1 goal of each individual therapy (PT/OT/ST) in order to minimize frustration tolerance with Mod A multimodal cues.   SLP Short Term Goal 2 (Week 2): Patient will utilize the call bell to request assistance with supervision multimodal cues.  SLP Short Term Goal 3 (Week 2): Patient will demonstrate functional problem solving for basic and familiar tasks with Min A multimodal cues.  SLP Short Term Goal 4 (Week 2): Patient will utilize external memory aids to recall new, daily information with Min A multimodal cues.  SLP Short Term Goal 5 (Week 2): Patient will demonstrate sustained attention to a functional task for 30 minutes with Min A multimodal cues for redirection.   Skilled Therapeutic Interventions: Skilled treatment session focused on cognitive-linguistic goals. Student facilitated session by providing Max A multimodal cues for functional problem solving and sustained attention during a mildly complex new learning task. Patient demonstrated decreased frustration tolerance and minimal understanding of clinical reasoning of task despite Max A multimodal cues for participation and increased awareness of current physical and cognitive impairments. Patient left in room in w/c with quick release belt in place and all needs within reach. Continue with current plan of care.   FIM:  Comprehension Comprehension Mode: Auditory Comprehension: 4-Understands basic 75 - 89% of the time/requires cueing 10 - 24% of the time Expression Expression Mode: Verbal Expression: 4-Expresses basic 75 - 89% of the time/requires cueing 10 - 24% of the time. Needs helper to occlude trach/needs to repeat  words. Social Interaction Social Interaction: 2-Interacts appropriately 25 - 49% of time - Needs frequent redirection. Problem Solving Problem Solving: 2-Solves basic 25 - 49% of the time - needs direction more than half the time to initiate, plan or complete simple activities Memory Memory: 2-Recognizes or recalls 25 - 49% of the time/requires cueing 51 - 75% of the time  Pain Pain Assessment Pain Assessment: No/denies pain  Therapy/Group: Individual Therapy  Javier Murillo 09/10/2014, 12:47 PM

## 2014-09-10 NOTE — Progress Notes (Signed)
Inpatient Diabetes Program Recommendations  AACE/ADA: New Consensus Statement on Inpatient Glycemic Control (2013)  Target Ranges:  Prepandial:   less than 140 mg/dL      Peak postprandial:   less than 180 mg/dL (1-2 hours)      Critically ill patients:  140 - 180 mg/dL    With increase in lantus to 30 units-potential for fasting hypoglycemia, then high after meals with a roller coaster effect. Inpatient Diabetes Program Recommendations Insulin - Basal: Now Fasting increased to 30 units-Fasting glucose does well with 25 units. Correction (SSI): xxxxxxxxxxxx Insulin - Meal Coverage: Please add 3 units meal coverage to cover meals (lantus is a basal insuin-it is not intended to cover po intake)  Thank you, Lenor CoffinAnn Shene Maxfield, RN, CNS, Diabetes Coordinator [phone])

## 2014-09-11 ENCOUNTER — Inpatient Hospital Stay (HOSPITAL_COMMUNITY): Payer: Medicare Other

## 2014-09-11 ENCOUNTER — Inpatient Hospital Stay (HOSPITAL_COMMUNITY): Payer: Medicare Other | Admitting: Speech Pathology

## 2014-09-11 DIAGNOSIS — R531 Weakness: Secondary | ICD-10-CM

## 2014-09-11 LAB — GLUCOSE, CAPILLARY
Glucose-Capillary: 103 mg/dL — ABNORMAL HIGH (ref 70–99)
Glucose-Capillary: 278 mg/dL — ABNORMAL HIGH (ref 70–99)

## 2014-09-11 MED ORDER — GLYBURIDE 5 MG PO TABS
5.0000 mg | ORAL_TABLET | Freq: Every day | ORAL | Status: DC
  Administered 2014-09-11: 5 mg via ORAL
  Filled 2014-09-11: qty 1

## 2014-09-11 NOTE — Progress Notes (Addendum)
Physical Therapy Discharge Summary  Patient Details  Name: Javier Murillo MRN: [id number] Date of Birth: [birth year]  Today's Date: 09/11/2014 PT Individual Time: 1100-1155 PT Individual Time Calculation (min): 55 min    Patient has met 7 of 10 long term goals due to improved activity tolerance, improved balance, improved postural control, increased strength, ability to compensate for deficits, increased functional use of  right upper extremity, right lower extremity, left upper extremity and left lower extremity, improved attention, improved awareness and improved coordination.  Patient currently requires supervision at w/c level and mod A at standing level with use of standard walker.   Patient's care partner unavailable to provide the necessary physical and cognitive assistance at discharge.  Reasons goals not met: Pt did not meet goals regard car t/f and day to day recall regarding physical therapy due to needing assistance greater than respective target goal levels. Pt did not achieve w/c propulsion goal in community setting due to need for continued practice in controlled/home environments.   Recommendation:  Patient will benefit from ongoing skilled PT services in skilled nursing facility setting to continue to advance safe functional mobility, address ongoing impairments in decreased decreased attention, decreased awareness, decreased memory, decreased safety awareness, decreased functional endurance, decreased sensation, decreased proprioception, decreased coordination, decreased balance, decreased strength decreased overall functional transfers and mobility and minimize fall risk.  Equipment: No equipment provided  Reasons for discharge: treatment goals met and discharge from hospital  Patient/family agrees with progress made and goals achieved: Yes  Skilled Therapeutic Interventions 1:1. Pt received sitting in w/c, ready for therapy. Focus this session on formal reassessment of  pt's functional transfers and mobility, see details below. Pt req overall supervision at w/c level and mod A at standing level for transfers and ambulation w/ standard walker up to 25'x2. Pt req max A for safe completion of car transfer. Pt continues to req consistent cueing with all transfers for safety and technique due to decreased emergent awareness and poor hand placement. Heavy reliance on compensatory strategies during transfers and standing mobility including B genu recurvatum and trunk extension for stability as well as use of momentum for advancement of B LE. Pt left sitting in w/c at end of session w/ all needs in reach.   PT Discharge Precautions/Restrictions Precautions Precautions: Cervical;Fall Required Braces or Orthoses: Cervical Brace (soft cervical collar) Restrictions Weight Bearing Restrictions: No Vital Signs Therapy Vitals Pulse Rate: 67 BP: 106/49 mmHg Pain Pain Assessment Pain Assessment: No/denies pain Vision/Perception  See OT discharge for complete details Perception Comments: neglects lower body during BADL d/t fear of falling, impaired FMC of both hands and poor postural control Praxis Praxis-Other Comments: inconsistent apraxia during transfers and functional mobility using standard walker  Cognition Overall Cognitive Status: Impaired/Different from baseline Arousal/Alertness: Awake/alert Orientation Level: Oriented X4 Attention: Sustained;Selective Focused Attention: Appears intact Sustained Attention: Impaired Sustained Attention Impairment: Verbal basic;Functional basic Selective Attention: Impaired Selective Attention Impairment: Verbal basic;Functional basic Alternating Attention: Impaired Alternating Attention Impairment: Verbal basic;Functional basic Memory: Impaired Memory Impairment: Decreased recall of new information;Decreased short term memory Decreased Short Term Memory: Verbal basic;Functional basic Awareness: Impaired Awareness  Impairment: Emergent impairment Problem Solving: Impaired Problem Solving Impairment: Verbal basic;Functional basic Behaviors: Poor frustration tolerance Safety/Judgment: Impaired Sensation Sensation Light Touch: Impaired Detail Light Touch Impaired Details: Impaired RUE;Impaired LUE;Impaired RLE;Impaired LLE Stereognosis: Impaired Detail Stereognosis Impaired Details: Impaired LUE;Impaired RUE Hot/Cold: Appears Intact Proprioception: Impaired Detail Proprioception Impaired Details: Impaired RUE;Impaired LUE;Impaired RLE;Impaired LLE Additional Comments: overall improved since time of  initial eval, however, pt continues to remain significantly impaired Coordination Gross Motor Movements are Fluid and Coordinated: No Fine Motor Movements are Fluid and Coordinated: No Coordination and Movement Description: jerky movements Finger Nose Finger Test: pathway deviations and slight ataxic movement, L worse than R Motor  Motor Motor: Ataxia;Abnormal postural alignment and control;Motor apraxia Motor - Discharge Observations: however, improved since initial eval  Mobility Bed Mobility Bed Mobility: Sit to Supine;Rolling Right;Right Sidelying to Sit Rolling Right: 4: Min assist Rolling Right Details: Verbal cues for technique Rolling Left: 5: Supervision Rolling Left Details: Verbal cues for technique;Verbal cues for safe use of DME/AE Right Sidelying to Sit: 4: Min assist Right Sidelying to Sit Details: Verbal cues for sequencing;Verbal cues for technique;Verbal cues for precautions/safety;Manual facilitation for weight shifting Left Sidelying to Sit: 5: Supervision;With rails;HOB elevated Left Sidelying to Sit Details: Verbal cues for technique Sitting - Scoot to Edge of Bed: 4: Min assist Sitting - Scoot to [employer] of Bed Details: Manual facilitation for weight bearing Sit to Supine: 4: Min guard Sit to Supine - Details: Verbal cues for precautions/safety Transfers Transfers: Yes Sit to  Stand: 3: Mod assist Sit to Stand Details: Manual facilitation for weight shifting;Verbal cues for technique;Verbal cues for safe use of DME/AE Sit to Stand Details (indicate cue type and reason): Consistent cues for hand placement; pt demonstrates significant trunk extension as well as B genu recurvatum as compensatory strategy despite cues for correction Stand to Sit: 3: Mod assist Stand to Sit Details (indicate cue type and reason): Verbal cues for technique;Manual facilitation for weight shifting;Verbal cues for safe use of DME/AE Stand to Sit Details: Consistent cues for hand placement Stand Pivot Transfers: 3: Mod assist Stand Pivot Transfer Details: Verbal cues for safe use of DME/AE;Verbal cues for technique;Manual facilitation for weight shifting Locomotion  Ambulation Ambulation: Yes Ambulation/Gait Assistance: 3: Mod assist Ambulation Distance (Feet): 25 Feet Assistive device: Standard walker Ambulation/Gait Assistance Details: Manual facilitation for weight shifting;Manual facilitation for placement;Verbal cues for technique;Verbal cues for sequencing Ambulation/Gait Assistance Details: Pt consistently looking at B LE due to decreased proprioception Gait Gait: Yes Gait Pattern: Impaired Gait Pattern: Scissoring;Ataxic;Step-through pattern;Decreased trunk rotation;Trunk flexed;Narrow base of support;Left genu recurvatum;Right genu recurvatum Gait velocity: decreased Stairs / Additional Locomotion Stairs: Yes Stairs Assistance: 3: Mod assist Stairs Assistance Details: Verbal cues for technique;Verbal cues for precautions/safety;Verbal cues for sequencing;Manual facilitation for weight shifting Stair Management Technique: Two rails;Step to pattern;Forwards Number of Stairs: 3 Height of Stairs: 4 Architect: Yes Wheelchair Assistance: 5: Investment banker, operational Details: Verbal cues for Marketing executive: Both upper  extremities Wheelchair Parts Management: Needs assistance Distance: 160  Trunk/Postural Assessment  Cervical Assessment Cervical Assessment: Exceptions to [facility] Cervical AROM Overall Cervical AROM: Deficits;Due to precautions Overall Cervical AROM Comments: soft c-collar Thoracic Assessment Thoracic Assessment: Exceptions to [facility] Thoracic AROM Overall Thoracic AROM: Deficits;Due to premorid status Overall Thoracic AROM Comments: moderate kyphotic posture Lumbar Assessment Lumbar Assessment: Exceptions to [facility] Lumbar AROM Overall Lumbar AROM: Deficits;Due to premorid status Overall Lumbar AROM Comments: stiff in all directions Postural Control Postural Control: Deficits on evaluation Head Control: limited by soft c-collar Righting Reactions: delayed and poor use of UEs Protective Responses: delayed and poor use of UEs Postural Limitations: premorbid kyphotic and forward head posture  Balance Balance Balance Assessed: Yes Standardized Balance Assessment Standardized Balance Assessment: Timed Up and Go Test Timed Up and Go Test TUG: Normal TUG Normal TUG (seconds): 110 Static Sitting Balance Static Sitting - Balance Support:  Feet supported;Bilateral upper extremity supported Static Sitting - Level of Assistance: 5: Stand by assistance Dynamic Sitting Balance Dynamic Sitting - Balance Support: Feet supported;During functional activity;Left upper extremity supported;Right upper extremity supported;Bilateral upper extremity supported Dynamic Sitting - Level of Assistance: 5: Stand by assistance;4: Min assist Dynamic Sitting - Balance Activities: Lateral lean/weight shifting;Forward lean/weight shifting;Reaching for objects;Reaching across midline Sitting balance - Comments: During BADL tasks; able to reach with supervision and cues although diminished initiation d/t sensory/motor deficits at bilateral hands with fear of falling Static Standing Balance Static Standing - Balance  Support: Bilateral upper extremity supported Static Standing - Level of Assistance: 4: Min assist Static Standing - Comment/# of Minutes: During BADL standing at standard walker for 45 seconds Dynamic Standing Balance Dynamic Standing - Balance Support: Right upper extremity supported;Left upper extremity supported;Bilateral upper extremity supported Dynamic Standing - Level of Assistance: 3: Mod assist Dynamic Standing - Balance Activities: Lateral lean/weight shifting;Forward lean/weight shifting;Reaching for objects Dynamic Standing - Comments: During BADL at sink Extremity Assessment  RUE Assessment RUE Assessment: Exceptions to [facility] RUE AROM (degrees) Overall AROM Right Upper Extremity: Deficits RUE Overall AROM Comments: Limited shoulder flexion, unable to don shirts overhead during assisted dressing RUE Strength RUE Overall Strength: Deficits RUE Overall Strength Comments: diminished grip strength with weakness at wrist/finger extension, grossly 4/5 at RUE RUE Tone RUE Tone: Within Functional Limits LUE Assessment LUE Assessment: Exceptions to WFL LUE AROM (degrees) Overall AROM Left Upper Extremity: Deficits LUE Overall AROM Comments: shoulder flexion limitation prevents donning shirts overhead LUE Strength LUE Overall Strength: Deficits LUE Overall Strength Comments: 3+/5 with weakness at wrist/finger extension complicated by sensory impairment (palmar hands, fingertips) LUE Tone LUE Tone: Within Functional Limits RLE Assessment RLE Assessment: Exceptions to [facility] RLE AROM (degrees) Overall AROM Right Lower Extremity: Within functional limits for tasks assessed RLE Strength RLE Overall Strength: Deficits RLE Overall Strength Comments: hip flexion 4/5, knee extension 4/5, knee flexion 4-/5, ankle DF 5/5 LLE Assessment LLE Assessment: Exceptions to WFL LLE AROM (degrees) Overall AROM Left Lower Extremity: Within functional limits for tasks assessed LLE Strength LLE  Overall Strength: Deficits LLE Overall Strength Comments: hip flexion 4-/5, knee extension 4/5, knee flexion 5/5, ankle DF 4/5  See FIM for current functional status  Javier Murillo 09/11/2014, 5:55 PM

## 2014-09-11 NOTE — Progress Notes (Signed)
Occupational Therapy Session Note  Patient Details  Name: Javier Murillo MRN: [id number] Date of Birth: 1930/11/12  Today's Date: 09/11/2014 OT Individual Time: [id number] OT Individual Time Calculation (min): 60 min    Short Term Goals: Week 2:  OT Short Term Goal 1 (Week 2): Pt will complete LB dressing with max assist OT Short Term Goal 2 (Week 2): Pt will sustain attention to task during BADL for 15 min with min cues for redirection OT Short Term Goal 3 (Week 2): Pt will complete transfer w/c<>BSC/toilet with mod assist  OT Short Term Goal 4 (Week 2): Pt will complete sit<>stand during LB dressing/bathing with mod assist  Skilled Therapeutic Interventions/Progress Updates: ADL-retraining with focus on improved static standing balance, dynamic sitting balance, weight-shifting, transfers and improved FMC of BUE.   Pt received supine in bed, disoriented to place and situation initially, resolved with min facilitation for improved recall.   Pt performed bed mobility this session with only setup and verbal cues for placement to rise to sitting at edge of bed.   Pt performed transfers during this session (bed<>w/c, w/c<>toilet & tub bench) consistently with mod assist (lowering) and mod verbal/tactile cues for hand placement and technique.   Pt demo's poor carry-over of technique from day-to-day d/t impaired memory.   Pt completed bath with overall mod assist after toileting (urine) with total assist for clothing management and hygiene d/t impaired FMC of bilateral hands.   Pt donned clothing near sink this session using standard walker for assist with lower body dressing, standing at walker while therapist pulled up brief and pants.   No LOB noted as pt stood for approx 45 seconds.   Pt returned to w/c for setup with self-feeding.   Despite numerous cues and extra time allowed for pt to direct care and/or choices, pt remains passive and compliant with commands provided by therapist during BADL and  setup assist.   Pt left in w/c, self-feeding with call light and phone within reach, safety belt attached.         Therapy Documentation Precautions:  Precautions Precautions: Cervical;Fall Required Braces or Orthoses: Cervical Brace Cervical Brace: Soft collar Restrictions Weight Bearing Restrictions: No  Vital Signs: Therapy Vitals Temp: 97.8 F (36.6 C) Temp Source: Oral Pulse Rate: 62 Resp: 18 BP: 149/81 mmHg Patient Position (if appropriate): Lying Oxygen Therapy SpO2: 98 % O2 Device: None (Room air)  Pain: Pain Assessment Pain Assessment: No/denies pain  See FIM for current functional status  Therapy/Group: Individual Therapy  Tondalaya Perren 09/11/2014, 8:38 AM

## 2014-09-11 NOTE — Progress Notes (Signed)
Speech Language Pathology Session Note & Discharge Summary  Patient Details  Name: Javier Murillo MRN: [id number] Date of Birth: 07/11/1930  Today's Date: 09/11/2014 SLP Individual Time: 0945-1045 SLP Individual Time Calculation (min): 60 min   Skilled Therapeutic Interventions: Skilled treatment session focused on cognitive-linguistic goals. Student facilitated session by educating patient on memory compensatory strategies in regards to taking notes, using external visual aids, keeping a routine and checking things off as patient completes them in order to increase recall and carryover of information. Patient was lethargic and required Mod-Max A multimodal cues for recall and sustained attention during a functional task of writing down memory compensatory strategies, suspect lethargy due to patient report that he did not sleep well last night. Patient was also verbose with stories about wife, [name], and required Mod A multimodal cues for redirection to task. Patient left in room in w/c with quick release belt in place and all needs within reach. Continue with current plan of care.   Patient has met 1 of 4 long term goals.  Patient to discharge at overall Mod;Max level.   Reasons goals not met: patient continues to require Mod-Max A multimodal cues for problem solving, memory and awareness   Clinical Impression/Discharge Summary: Patient has made minimal gains and has met 1 of 4 LTG's this admission due to increased attention. Currently, patient continues to require Mod-Max A for emergent awareness, working memory and functional problem solving with basic and familiar tasks. Patient has demonstrated increased participation with therapy but continues to demonstrate decreased frustration tolerance and limited insight into goals of skilled intervention across disciplines. Patient's family is unable to provide the necessary cognitive and physical assistance needed at this time, therefore, patient will  discharge to SNF with 24 hour supervision. Patient would benefit from f/u skilled SLP services to maximize overall cognitive-linguistic function in order to maximize his functional independence and reduce caregiver burden.   Care Partner:  Caregiver Able to Provide Assistance: No  Type of Caregiver Assistance: Physical;Cognitive  Recommendation:  24 hour supervision/assistance;Skilled Nursing facility  Rationale for SLP Follow Up: Maximize cognitive function and independence;Reduce caregiver burden   Equipment: N/A   Reasons for discharge: Discharged from hospital   Patient/Family Agrees with Progress Made and Goals Achieved: Yes   See FIM for current functional status  ,  09/11/2014, 4:22 PM    

## 2014-09-11 NOTE — Plan of Care (Signed)
Problem: SCI BLADDER ELIMINATION Goal: RH STG MANAGE BLADDER WITH EQUIPMENT WITH ASSISTANCE STG Manage Bladder With Equipment With Max Assistance  Outcome: Progressing Condom cath at night

## 2014-09-11 NOTE — Discharge Summary (Signed)
Javier Murillo, Javier Murillo NO.:  [id number]  MEDICAL RECORD NO.:  [id number]  LOCATION:  [id number]                        FACILITY:  [facility]  PHYSICIAN:  Ranelle Oyster, M.D.DATE OF BIRTH:  04/30/1930  DATE OF ADMISSION:  08/28/2014 DATE OF DISCHARGE:  09/11/2014                              DISCHARGE SUMMARY   DISCHARGE DIAGNOSES: 1. 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 2. Sequential compression devices for DVT prophylaxis with negative     Dopplers. 3. Pain management. 4. Depression. 5. Diabetes mellitus, peripheral neuropathy. 6. Hypertension. 7. Escherichia coli urinary tract infection-Cipro completed.  HISTORY OF PRESENT ILLNESS:  This 80 year old right-handed male with history of multiple back surgeries presented August 22, 2014, with recent fall on July 14, 2014, with progressive weakness in the upper extremities and worse in the lower extremities.  He was ambulating with a single-point cane and later needed a walker and wheelchair.  MRI of the cervical spine revealed multilevel spondylosis and degenerative disease with disk protrusions at the levels but with C3 and C4 spondylolisthesis and disk herniation resulting in severe stenosis with myelopathy.  Underwent C3-4 anterior cervical decompression and arthrodesis on August 22, 2014, per Dr. Newell Coral.  Postoperative pain management.  Completed Decadron taper.  Physical and occupational therapy ongoing.  The patient was admitted for a comprehensive rehab program.  PAST MEDICAL HISTORY:  See discharge diagnoses.  SOCIAL HISTORY:  Lives alone.  FUNCTIONAL HISTORY:  Prior to admission, recently using a walker, wheelchair.  FUNCTIONAL STATUS:  Upon admission to Rehab Services was max assist +2 to ambulate 16 feet, moderate assist for side lying to sit, mod to max assist for activities of daily living.  PHYSICAL EXAMINATION:   VITAL SIGNS:  Blood pressure 167/86, pulse 72, temperature 98, respirations 18. GENERAL:  This was an alert male, followed simple commands.  He was able to write his name and age but very tangential in thought processing, he was hard of hearing. LUNGS:  Clear to auscultation. CARDIAC:  Regular rate and rhythm. ABDOMEN:  Soft, nontender.  Good bowel sounds.  Surgical neck incision clean and dry.  REHABILITATION HOSPITAL COURSE:  The patient was admitted to Inpatient Rehab Services with therapies initiated on a 3-hour daily basis consisting of physical therapy, occupational therapy, speech therapy, and rehabilitation nursing.  The following issues were addressed during the patient's rehabilitation stay.  Pertaining to Mr. Pursel's functional deficits secondary to cervical myelopathy, he had undergone decompression and fusion with central cord presentation and would follow up Dr. Newell Coral of Neurosurgery.  Surgical site healing nicely. Sequential compression devices in place for DVT prophylaxis.  Venous Doppler studies negative.  Pain management with the use of hydrocodone and monitored closely.  He did have a history of depression, he continued on Prozac with emotional support provided.  Diabetes mellitus, peripheral neuropathy.  His insulin was adjusted as his Decadron had been tapered to off as well as remaining on oral agents of glyburide. He was receiving a bedtime snack.  Blood pressure was controlled with Tenormin, no orthostatic changes.  He had completed a course of Cipro for an E. coli urinary tract infection.  The patient received weekly collaborative interdisciplinary team conferences to discuss estimated length of stay, family teaching, and any barriers to discharge.  He was propelling his wheelchair 150 feet to the rehabilitation gym with supervision and lots of encouragement.  Working with a standard walker to ambulate 50 feet through the rehab gym with several obstacles.   Min to mod assist to remain standing.  Activities of daily living working with bed mobility, transfers, static standing balance, and problem solving.  He completed bed mobility with only minimal assist through a session but somewhat inconsistent.  Completed stand pivot transfers to wheelchair with only lowering assist required.  He was followed by Speech Therapy for some cognitive delay in processing.  Max assist cues for functional problem solving and sustained attention.  The patient with slow progressive gains throughout his rehab stay with limited as ordered home it was felt that skilled nursing facility was needed with bed becoming available September 12, 2014, and discharge taking place at that time.  DISCHARGE MEDICATIONS: 1. Tenormin 12.5 mg p.o. daily. 2. Dulcolax suppository daily as needed. 3. Vitamin D 1000 units p.o. daily. 4. Ferrous sulfate 325 mg p.o. daily. 5. Prozac 20 mg p.o. daily. 6. Flonase 2 sprays each nostril daily as needed. 7. Glyburide 10 mg p.o. daily at breakfast and 5 mg p.o. daily at     lunch. 8. Hydrocodone 1-2 tablets every 4 hours as needed for pain. 9. Lantus insulin 30 units subcutaneously every morning.  DIET:  Diabetic ADA diet.  SPECIAL INSTRUCTIONS:  The patient would follow up Dr. Faith RogueZachary Swartz at the outpatient rehab center as directed; Dr. Shirlean Kellyobert Nudelman, Neurosurgery, call for appointment.     Mariam Dollaraniel Asim Gersten, P.A.   ______________________________ Ranelle OysterZachary T. Swartz, M.D.   DA/MEDQ  D:  09/11/2014  T:  09/11/2014  Job:  [id number]  cc:   Hewitt Shortsobert W. Nudelman, M.D. Georgann HousekeeperKarrar Husain, MD

## 2014-09-11 NOTE — Progress Notes (Signed)
Social Work Patient ID: Javier Murillo, male   DOB: 1930-07-20, 80 y.o.   MRN: [id number]  Have received SNF bed offer today from [facility] - pt and family have accepted with plan to transfer pt to facility today.  Have alerted tx team and MD.  Amada JupiterHOYLE, An Lannan, LCSW

## 2014-09-11 NOTE — Progress Notes (Signed)
Social Work  Discharge Note  The overall goal for the admission was met for:   Discharge location: No - plan changed to SNF as family cannot provide 24/7 assist  Length of Stay: No - extended slightly due to SNF bed search - LOS=14 days  Discharge activity level: Yes - minimal assist overall  Home/community participation: Yes  Services provided included: MD, RD, PT, OT, RN, TR, Pharmacy, Neuropsych and SW  Financial Services: Medicare ([employer])  Follow-up services arranged: Other: SNF @ [facility]  Comments (or additional information):  Patient/Family verbalized understanding of follow-up arrangements: Yes  Individual responsible for coordination of the follow-up plan: daughter  Confirmed correct DME delivered: NA    ,  

## 2014-09-11 NOTE — Discharge Summary (Signed)
Discharge summary job # [id number]

## 2014-09-11 NOTE — Progress Notes (Signed)
The skilled treatment note has been reviewed and SLP is in agreement.  Shray Hunley, M.A., CCC-SLP  [phone]   

## 2014-09-11 NOTE — Plan of Care (Signed)
Problem: RH Car Transfers Goal: LTG Patient will perform car transfers with assist (PT) LTG: Patient will perform car transfers with assistance (PT).  Outcome: Not Met (add Reason) Pt req max A for safety due to attempts at sitting prematurely  Problem: RH Wheelchair Mobility Goal: LTG Patient will propel w/c in community environment (PT) LTG: Patient will propel wheelchair in community environment, # of feet with assist (PT)  Outcome: Not Met (add Reason) Not tested due to continued need for practice in controlled/home environments.   Problem: RH Memory Goal: LTG Patient demonstrate ability for day to day recall (PT) LTG: Patient will demonstrate ability for day to day recall/carryover during mobility activities with assist (PT)  Outcome: Not Met (add Reason) Pt consistently req >min A for memory

## 2014-09-11 NOTE — Progress Notes (Signed)
[facility] PHYSICAL [facility]ICINE & REHABILITATION     PROGRESS NOTE    Subjective/Complaints: No new problems.  A  review of systems has been performed and if not noted above is otherwise negative.   Objective: Vital Signs: Blood pressure 149/81, pulse 62, temperature 97.8 F (36.6 C), temperature source Oral, resp. rate 18, height 6' (1.829 m), weight 70.9 kg (156 lb 4.9 oz), SpO2 98.00%. No results found. No results found for this basename: WBC, HGB, HCT, PLT,  in the last 72 hours No results found for this basename: NA, K, CL, CO, GLUCOSE, BUN, CREATININE, CALCIUM,  in the last 72 hours CBG (last 3)   Recent Labs  09/10/14 1625 09/10/14 2044 09/11/14 0708  GLUCAP 331* 113* 103*    Wt Readings from Last 3 Encounters:  09/05/14 70.9 kg (156 lb 4.9 oz)  08/22/14 71 kg (156 lb 8.4 oz)  08/22/14 71 kg (156 lb 8.4 oz)    Physical Exam:   General ADL Comments: CNA educated on proper set up to increase independence with self feeding  Cognition:  Cognition  Overall Cognitive Status: Impaired/Different from baseline  Orientation Level: Oriented to person;Oriented to time;Oriented to situation;Disoriented to place  Cognition  Arousal/Alertness: Awake/alert  Behavior During Therapy: WFL for tasks assessed/performed  Overall Cognitive Status: Impaired/Different from baseline    Physical Exam:    Constitutional: He appears well-developed.  HENT: dentition fair/poor Head: Normocephalic.  Eyes: EOM are normal.  Neck: Neck supple. No thyromegaly present.  Cardiovascular: Normal rate and regular rhythm.  Respiratory: Effort normal and breath sounds normal. No respiratory distress.  GI: Soft. Bowel sounds are normal. He exhibits no distension.  Neurological: He is alert.  His voice is intelligible. Quicker to respond today. Doesn't initiate much conversation.  Sensory loss to PP/LT in hands. MMT: deltoids 4/5. Bicep/triceps 4/5. Wrist 4-, Right HI 3- to 3/5. Left HI 2+ to 3-.  LE: HF 3+, KE 4-, ADF/APF 4/5. No resting tone. DTR's 1+.    Skin:  Surgical neck incision clean and dry  Psychiatric: He has a normal mood and affect. His behavior is normal. pleasant    Assessment/Plan: 1. 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 Physiatrist is providing close team supervision and 24 hour management of active medical problems listed below. Physiatrist and rehab team continue to assess barriers to discharge/monitor patient progress toward functional and medical goals.     FIM: FIM - Bathing Bathing Steps Patient Completed: Abdomen;Front perineal area Bathing: 1: Total-Patient completes 0-2 of 10 parts or less than 25%  FIM - Upper Body Dressing/Undressing Upper body dressing/undressing steps patient completed: Thread/unthread right sleeve of pullover shirt/dresss;Thread/unthread left sleeve of pullover shirt/dress Upper body dressing/undressing: 3: Mod-Patient completed 50-74% of tasks FIM - Lower Body Dressing/Undressing Lower body dressing/undressing steps patient completed: Pull pants up/down Lower body dressing/undressing: 1: Total-Patient completed less than 25% of tasks  FIM - Hotel manager Devices: Grab bar or rail for support Toileting: 1: Total-Patient completed zero steps, helper did all 3  FIM - Diplomatic Services operational officer Devices: Grab bars Toilet Transfers: 4-To toilet/BSC: Min A (steadying Pt. > 75%);3-From toilet/BSC: Mod A (lift or lower assist)  FIM - Bed/Chair Transfer Bed/Chair Transfer Assistive Devices: Bed rails;Arm rests;HOB elevated Bed/Chair Transfer: 3: Bed > Chair or W/C: Mod A (lift or lower assist);3: Chair or W/C > Bed: Mod A (lift  or lower assist)  FIM - Locomotion: Wheelchair Distance: 200 Locomotion: Wheelchair: 5: Travels 150 ft or  more: maneuvers on rugs and over door sills with supervision, cueing or coaxing FIM - Locomotion: Ambulation Locomotion: Ambulation Assistive Devices: Environmental consultantWalker - Standard Ambulation/Gait Assistance: 3: Mod assist Locomotion: Ambulation: 2: Travels 50 - 149 ft with moderate assistance (Pt: 50 - 74%)  Comprehension Comprehension Mode: Auditory Comprehension: 4-Understands basic 75 - 89% of the time/requires cueing 10 - 24% of the time  Expression Expression Mode: Verbal Expression: 4-Expresses basic 75 - 89% of the time/requires cueing 10 - 24% of the time. Needs helper to occlude trach/needs to repeat words.  Social Interaction Social Interaction Mode: Asleep Social Interaction: 2-Interacts appropriately 25 - 49% of time - Needs frequent redirection.  Problem Solving Problem Solving Mode: Asleep Problem Solving: 2-Solves basic 25 - 49% of the time - needs direction more than half the time to initiate, plan or complete simple activities  Memory Memory Mode: Asleep Memory: 2-Recognizes or recalls 25 - 49% of the time/requires cueing 51 - 75% of the time  Medical Problem List and Plan:  1. Functional deficits secondary to C3-4 myelopathy status post decompression fusion. Central cord presentation.  2. DVT Prophylaxis/Anticoagulation: SCDs. Monitor for any signs of DVT. Dopplers negative 3. Pain Management: Hydrocodone as needed. Monitor with increased mobility  4. Mood/depression: Prozac 20 mg daily. Provide emotional support and  5. Neuropsych: This patient is capable of making decisions on his own behalf.  6. Skin/Wound Care: Routine skin checks  7. Diabetes mellitus with peripheral neuropathy.   Some improvement  -Adjusted lantus to 30u qam.    -stopped regular insulin scheduled with lunch and dinner  -change diabeta to 10mg  qam and 5pm at lunch  -now off steroids  -rx uti  - hs snack  -need to avoid overtreating hypoglycemia and vice versa 8. Hypertension. Tenormin 12.5 mg  daily. Monitor with increased mobility  9. Urine: ecoli uti rx'ed with  cipro    LOS (Days) 14 A FACE TO FACE EVALUATION WAS PERFORMED  Kayson Tasker T 09/11/2014 7:54 AM

## 2014-09-11 NOTE — Progress Notes (Signed)
Occupational Therapy Discharge Summary  Patient Details  Name: Javier Murillo MRN: [id number] Date of Birth: Oct 13, 1930  Today's Date: 09/11/2014   Patient has met 8 of 13 long term goals due to improved activity tolerance, improved balance and ability to compensate for deficits.  Patient to discharge at overall Mod Assist level.  Patient's care partner unavailable to provide the necessary physical and cognitive assistance at discharge; therefore, pt to d/c to SNF.    Reasons goals not met: Diminished sensation and FMC of bilateral hands, cognitive impairment (attention, awareness, memory) with poor carry-over of skills development, fear of falling with rigid behavioral pattern resisting guidance/direction.  Recommendation:  Patient will benefit from ongoing skilled OT services in skilled nursing facility setting to continue to advance functional skills in the area of BADL and reduce burden of care.  Equipment: No equipment provided  Reasons for discharge: discharge from hospital  Patient/family agrees with progress made and goals achieved: Yes  OT Discharge Precautions/Restrictions  Precautions Precautions: Cervical;Fall Required Braces or Orthoses: Cervical Brace (soft cervical collar) Restrictions Weight Bearing Restrictions: No  Vital Signs Therapy Vitals Pulse Rate: 67 BP: 106/49 mmHg  Pain Pain Assessment Pain Assessment: No/denies pain  ADL ADL ADL Comments: see FIM  Vision/Perception  Vision- History Baseline Vision/History: Wears glasses Wears Glasses: At all times Patient Visual Report: No change from baseline Vision- Assessment Vision Assessment?: No apparent visual deficits Perception Comments: neglects lower body during BADL d/t fear of falling, impaired FMC of both hands and poor postural control Praxis Praxis-Other Comments: inconsistent apraxia during transfers and functional mobility using standard walker   Cognition Overall Cognitive Status:  Impaired/Different from baseline Arousal/Alertness: Lethargic Orientation Level: Oriented X4 Attention: Sustained Focused Attention: Appears intact Sustained Attention: Impaired Sustained Attention Impairment: Verbal basic;Functional basic Selective Attention: Impaired Selective Attention Impairment: Verbal basic;Functional basic Alternating Attention: Impaired Alternating Attention Impairment: Verbal basic;Functional basic Memory: Impaired Memory Impairment: Decreased recall of new information;Decreased short term memory Decreased Short Term Memory: Verbal basic;Functional basic Awareness: Impaired Awareness Impairment: Emergent impairment Problem Solving: Impaired Problem Solving Impairment: Verbal basic;Functional basic Behaviors: Poor frustration tolerance Safety/Judgment: Impaired  Sensation Sensation Light Touch: Impaired Detail Light Touch Impaired Details: Impaired RUE;Impaired LUE Stereognosis: Impaired Detail Stereognosis Impaired Details: Impaired LUE;Impaired RUE Hot/Cold: Appears Intact Proprioception: Impaired Detail Proprioception Impaired Details: Impaired RUE;Impaired LUE Additional Comments: Improved FMC during IP rehab admission however pt continues to require cues to open his hands, particularly left hand.   Pt able to self-feed using foam tubing. Coordination Gross Motor Movements are Fluid and Coordinated: No Fine Motor Movements are Fluid and Coordinated: No Coordination and Movement Description: jerky movements Finger Nose Finger Test: pathway deviations and slight ataxic movement, L worse than R  Motor  Motor Motor: Ataxia;Abnormal postural alignment and control;Motor apraxia  Mobility  Bed Mobility Bed Mobility: Rolling Left;Left Sidelying to Sit;Sitting - Scoot to Edge of Bed Rolling Left: 5: Supervision Rolling Left Details: Verbal cues for technique;Verbal cues for safe use of DME/AE Left Sidelying to Sit: 5: Supervision;With rails;HOB  elevated Left Sidelying to Sit Details: Verbal cues for technique Sitting - Scoot to Edge of Bed: 4: Min assist Sitting - Scoot to [place] of Bed Details: Manual facilitation for weight bearing Transfers Transfers: Sit to Stand;Stand to Sit Sit to Stand: 3: Mod assist Sit to Stand Details: Manual facilitation for weight shifting;Verbal cues for technique Stand to Sit: 3: Mod assist Stand to Sit Details (indicate cue type and reason): Tactile cues for placement;Manual facilitation for weight shifting  Trunk/Postural Assessment  Cervical Assessment Cervical Assessment: Exceptions to [facility] Cervical AROM Overall Cervical AROM: Deficits;Due to precautions Overall Cervical AROM Comments: soft c-collar Thoracic Assessment Thoracic Assessment: Exceptions to [facility] Thoracic AROM Overall Thoracic AROM: Deficits;Due to premorid status Overall Thoracic AROM Comments: moderate kyphotic posture Lumbar Assessment Lumbar Assessment: Exceptions to [facility] Lumbar AROM Overall Lumbar AROM: Deficits;Due to premorid status Overall Lumbar AROM Comments: stiff in all directions Postural Control Postural Control: Deficits on evaluation Head Control: limited by soft c-collar Righting Reactions: delayed and poor use of UEs Protective Responses: delayed and poor use of UEs Postural Limitations: premorbid kyphotic and forward head posture   Balance Balance Balance Assessed: Yes Standardized Balance Assessment Standardized Balance Assessment: Timed Up and Go Test Timed Up and Go Test TUG: Normal TUG Normal TUG (seconds): 110 Static Sitting Balance Static Sitting - Balance Support: Feet supported;Bilateral upper extremity supported Static Sitting - Level of Assistance: 5: Stand by assistance Dynamic Sitting Balance Dynamic Sitting - Balance Support: Feet supported;During functional activity Dynamic Sitting - Level of Assistance: 5: Stand by assistance Dynamic Sitting - Balance Activities: Reaching for  objects;Reaching across midline;Trunk control activities Sitting balance - Comments: During BADL tasks; able to reach with supervision and cues although diminished initiation d/t sensory/motor deficits at bilateral hands with fear of falling Static Standing Balance Static Standing - Balance Support: During functional activity;Bilateral upper extremity supported Static Standing - Level of Assistance: 4: Min assist Static Standing - Comment/# of Minutes: During BADL standing at standard walker for 45 seconds Dynamic Standing Balance Dynamic Standing - Balance Support: During functional activity;Left upper extremity supported Dynamic Standing - Level of Assistance: 2: Max assist Dynamic Standing - Balance Activities: Lateral lean/weight shifting;Reaching for objects Dynamic Standing - Comments: During BADL at sink  Extremity/Trunk Assessment RUE Assessment RUE Assessment: Exceptions to [facility] RUE AROM (degrees) Overall AROM Right Upper Extremity: Deficits RUE Overall AROM Comments: Limited shoulder flexion, unable to don shirts overhead during assisted dressing RUE Strength RUE Overall Strength: Deficits RUE Overall Strength Comments: diminished grip strength with weakness at wrist/finger extension, grossly 4/5 at RUE RUE Tone RUE Tone: Within Functional Limits LUE Assessment LUE Assessment: Exceptions to [facility] LUE AROM (degrees) Overall AROM Left Upper Extremity: Deficits LUE Overall AROM Comments: shoulder flexion limitation prevents donning shirts overhead LUE Strength LUE Overall Strength: Deficits LUE Overall Strength Comments: 3+/5 with weakness at wrist/finger extension complicated by sensory impairment (palmar hands, fingertips) LUE Tone LUE Tone: Within Functional Limits  See FIM for current functional status  Arieal Cuoco 09/11/2014, 3:16 PM

## 2014-09-11 NOTE — Progress Notes (Signed)
Recreational Therapy Discharge Summary Patient Details  Name: Javier Murillo MRN: [id number] Date of Birth: Sep 19, 1930 Today's Date: 09/11/2014  Long term goals set: 1  Long term goals met: 1  Comments on progress toward goals: Pt is discharging to SNF for continued therapies & 24 hour care.  Pt is discharging at overall min assist/min-mod cues  level for simple TR tasks seated. Reasons for discharge: discharge from hospital  Patient/family agrees with progress made and goals achieved: Yes  Karyn Brull 09/11/2014, 5:27 PM

## 2014-10-02 ENCOUNTER — Telehealth: Payer: Self-pay | Admitting: Neurology

## 2014-10-02 ENCOUNTER — Ambulatory Visit: Payer: Medicare Other | Admitting: Neurology

## 2014-10-02 NOTE — Telephone Encounter (Signed)
This patient did not show for a revisit appointment today. 

## 2014-10-03 ENCOUNTER — Encounter: Payer: Self-pay | Admitting: *Deleted

## 2014-10-24 ENCOUNTER — Encounter: Payer: Self-pay | Admitting: Neurology

## 2014-10-30 ENCOUNTER — Encounter: Payer: Self-pay | Admitting: Neurology

## 2014-12-18 LAB — BASIC METABOLIC PANEL: CREATININE: 1.3 mg/dL (ref <=1.3)

## 2014-12-18 LAB — HEPATIC FUNCTION PANEL: ALT: 22 U/L (ref 10–40)

## 2014-12-18 LAB — HEMOGLOBIN A1C: Hgb A1c MFr Bld: 9.3 % — AB (ref 4.0–6.0)

## 2015-01-30 ENCOUNTER — Encounter: Payer: Self-pay | Admitting: Endocrinology

## 2015-01-30 ENCOUNTER — Ambulatory Visit (INDEPENDENT_AMBULATORY_CARE_PROVIDER_SITE_OTHER): Payer: Medicare Other | Admitting: Endocrinology

## 2015-01-30 VITALS — BP 112/64 | HR 59 | Temp 94.0°F | Wt 172.4 lb

## 2015-01-30 DIAGNOSIS — E1142 Type 2 diabetes mellitus with diabetic polyneuropathy: Secondary | ICD-10-CM

## 2015-01-30 DIAGNOSIS — E1165 Type 2 diabetes mellitus with hyperglycemia: Secondary | ICD-10-CM

## 2015-01-30 DIAGNOSIS — E1122 Type 2 diabetes mellitus with diabetic chronic kidney disease: Secondary | ICD-10-CM

## 2015-01-30 DIAGNOSIS — E1151 Type 2 diabetes mellitus with diabetic peripheral angiopathy without gangrene: Secondary | ICD-10-CM

## 2015-01-30 DIAGNOSIS — R600 Localized edema: Secondary | ICD-10-CM

## 2015-01-30 DIAGNOSIS — IMO0002 Reserved for concepts with insufficient information to code with codable children: Secondary | ICD-10-CM

## 2015-01-30 DIAGNOSIS — R001 Bradycardia, unspecified: Secondary | ICD-10-CM

## 2015-01-30 DIAGNOSIS — D649 Anemia, unspecified: Secondary | ICD-10-CM | POA: Insufficient documentation

## 2015-01-30 DIAGNOSIS — E1342 Other specified diabetes mellitus with diabetic polyneuropathy: Secondary | ICD-10-CM

## 2015-01-30 DIAGNOSIS — G629 Polyneuropathy, unspecified: Secondary | ICD-10-CM

## 2015-01-30 DIAGNOSIS — N182 Chronic kidney disease, stage 2 (mild): Secondary | ICD-10-CM

## 2015-01-30 NOTE — Patient Instructions (Addendum)
Check blood sugar at 8 PM every second day  STOP GLYBURIDE  HUMALOG insulin: He will get a fixed dose before each meal regardless of blood sugar as follows:  BREAKFAST = 2 units.  LUNCH = 3 units.  DINNER = 3 units  Change sliding scale Humalog as follows and this is to be given IN addition to the above fixed dose:  Blood sugar under 200: No insulin Blood sugar 200-249 = 1 unit 250 and above = 2 units  Please fax  legible records of his blood sugar on 02/04/15 to the following [phone]

## 2015-01-30 NOTE — Progress Notes (Signed)
Patient ID: Javier Murillo, male   DOB: Mar 12, 1930, 81 y.o.   MRN: [id number]           Reason for Appointment: Consultation for Type 2 Diabetes  Referring physician: Zachery Dauer  History of Present Illness:          Diagnosis: Type 2 diabetes mellitus, date of diagnosis:1975        Past history: He may have been treated with oral agents for the first few years of his diabetes management He thinks he has been on insulin for several years, uncertain how long. For some reason he has been continued on glyburide also which she probably has been taking for a long time Not clear if he had taken metformin or why it was stopped He probably has been on Lantus for quite some time He has had fair control of his diabetes over the last 2-3 years with A1c ranging from 7.4-9.3, relatively higher since 10/2013  Recent history:  Apparently was taking variable doses of Lantus prior to his hospitalization in 10/15 Subsequently he had been on a fixed dose of Lantus along with sliding scale Humalog based on blood sugar levels before meals Because of persistently high A1c of 9% or more since 06/2014 he is now referred here for further management  He is now taking 33 units of Lantus in the morning consistently His Humalog dose ranges from 2-12 units based on blood sugar but not getting any insulin for glucose below 100. From his MAR he appears to be getting usually 2 units at breakfast and lunch and 2-6 units at supper time, rarely 10 units His blood sugar readings are very difficult to read on his nursing home records Blood sugars appear to be relatively good at breakfast and lunch and mostly high at suppertime Diet: He is generally ordering whatever he wants on his menu at the nursing home and may frequently eat ice cream Hypoglycemia: None         Oral hypoglycemic drugs the patient is taking are: Glyburide 10 mg a.m., 5 mg at noon      Side effects from medications have been: None INSULIN regimen is  described as:   Lantus 33 units in a.m., Humalog as above  Glucose monitoring:  done one time a day         Glucometer: Unknown       Blood Glucose readings by time of day and averages from home record recently  PREMEAL Breakfast Lunch Dinner Bedtime  Overall   Glucose range: 72-266 76-159 108-319  not done    Median:         Self-care: The diet that the patient has been following is: None, patient is generally eating liberal diet which he orders at his nursing home    Meals: 3 meals per day. Breakfast is eggs, toast, sausage.  Lunch and dinner usually a meat or fish along with vegetables, bread and sugar-free ice cream.  Usually has sugar-free snacks           Physical activity:  none, in wheelchair         Dietician visit, most recent: Never .               Weight history:  Wt Readings from Last 3 Encounters:  01/30/15 172 lb 6.4 oz (78.2 kg)  09/05/14 156 lb 4.9 oz (70.9 kg)  08/22/14 156 lb 8.4 oz (71 kg)    Glycemic control:   Lab Results  Component Value Date  HGBA1C 9.3* 12/18/2014   HGBA1C 8.8* 07/24/2014   Lab Results  Component Value Date   LDLCALC 35 07/24/2014   CREATININE 1.3 12/18/2014    Urine microalbumin/creatinine ratio 50.4 done on 10/27/13     Medication List       This list is accurate as of: 01/30/15 12:10 PM.  Always use your most recent med list.               atenolol 25 MG tablet  Commonly known as:  TENORMIN  Take 12.5 mg by mouth daily.     cholecalciferol 1000 UNITS tablet  Commonly known as:  VITAMIN D  Take 1,000 Units by mouth every morning.     ferrous sulfate 325 (65 FE) MG tablet  Take 325 mg by mouth daily with breakfast.     FLUoxetine 20 MG capsule  Commonly known as:  PROZAC  Take 20 mg by mouth daily.     insulin aspart 100 UNIT/ML injection  Commonly known as:  novoLOG  Inject into the skin as needed for high blood sugar.     insulin glargine 100 UNIT/ML injection  Commonly known as:  LANTUS  Inject 19  Units into the skin daily.        Allergies:  Allergies  Allergen Reactions  . Fluvirin [Influenza Virus Vaccine Split] Other (See Comments)    Gave patient flu    Past Medical History  Diagnosis Date  . Quadriplegia and quadriparesis 08/20/2014  . Diabetic peripheral neuropathy   . Gait disorder   . Peripheral vascular disease   . Degenerative arthritis   . Lumbosacral spondylosis   . Depression   . Chronic low back pain   . Kidney stones     only once  . Hypertension   . Hyperlipidemia   . Arthritis   . Diabetes mellitus without complication     Past Surgical History  Procedure Laterality Date  . Shoulder surgery Bilateral   . Cataract extraction w/ intraocular lens  implant, bilateral    . Bursitis Bilateral     olecranon I&D  . Lumbar spine surgery      x7  . Back surgery      x 7  . Eye surgery      bilateral cataracts  . Bone spur removed  right sholder    . Appendectomy    . Anterior cervical decomp/discectomy fusion N/A 08/22/2014    Procedure: ANTERIOR CERVICAL DECOMPRESSION/DISCECTOMY FUSION, cervical three-four;  Surgeon: Hewitt Shorts, MD;  Location: [facility];  Service: Neurosurgery;  Laterality: N/A;  C3-4 anterior cervical decompression with fusion plating and bonegraft    Family History  Problem Relation Age of Onset  . Cancer Mother   . Heart attack Father   . Dementia Sister     Social History:  reports that he has quit smoking. His smoking use included Cigarettes. He has a 10 pack-year smoking history. He does not have any smokeless tobacco history on file. He reports that he does not drink alcohol or use illicit drugs.    Review of Systems      No headaches.      Vision is normal. Most recent eye exam was over a year ago.  Has had cataract surgery       Lipids: No recent labs available, not on any medications       Lab Results  Component Value Date   CHOL 108 07/24/2014   HDL 59 07/24/2014   LDLCALC  35 07/24/2014   TRIG  68 07/24/2014   CHOLHDL 1.8 07/24/2014                  Skin: No rash or infections     Thyroid:  No  unusual fatigue.     The blood pressure has been normal: Not clear why he is taking atenolol     For the last month has had significant swelling of feet.  He is not on diuretics and is supposed to wear elastic stockings     No shortness of breath or chest tightness on exertion.     Bowel habits: Normal.      No joint  pains.          No history of Numbness, tingling or burning in feet but does have some numbness and tingling in fingers.  Previously prescribed gabapentin for unknown reasons.  He has some urinary incontinence. Previous history of erectile dysfunction present   He does have weakness in his legs from central cord problems previously and not able to ambulate    Physical Examination:  BP 112/64 mmHg  Pulse 59  Temp(Src) 94 F (34.4 C)  Wt 172 lb 6.4 oz (78.2 kg)  SpO2 90%  GENERAL:         Patient is averagely built and nourished without significant obesity HEENT:         Eye exam shows normal external appearance. Fundus exam shows no retinopathy, right fundus less clearly visible. Oral exam shows normal mucosa and tongue .  NECK:         General:  Neck exam shows no lymphadenopathy. Carotids are normal to palpation and no bruit heard.  Thyroid is not enlarged and no nodules felt.   LUNGS:         Chest is symmetrical. Lungs are clear to auscultation.Marland Kitchen.   HEART:         Heart sounds:  S1 and S2 are normal. No murmurs or clicks heard., no S3 or S4.   ABDOMEN:   There is no distention present. Liver and spleen are not palpable. No other mass or tenderness present.   NEUROLOGICAL:  he is fairly alert although answering questions somewhat slowly.  Vibration sense is likely absent in toes, some limitation of patient cooperation present. Ankle jerks are absent bilaterally.          Diabetic foot exam shows normal monofilament sensation in the right toes but probably  absent on the left and plantar surfaces; patient is somewhat inattentive to the testing. Has no skin lesions or ulcers on the feet and ABSENT pedal pulses MUSCULOSKELETAL:       There is no enlargement or deformity of the joints. Spine is normal to inspection.Marland Kitchen.   SKIN:       No rash or lesions of concern.       EXTREMITIES:     There is 3+ lower leg and pedal edema. No skin lesions or pigmentation present..  ASSESSMENT:  Diabetes type 2, uncontrolled with recent A1c of 9.3%. He has had long-standing diabetes and is not significantly obese. Most likely is insulin deficient and will need to continue basal bolus insulin regimen that he is taking Is not taking very large amounts of insulin considering his weight. However his taking Humalog as needed only and this tends to cause relatively high readings at times when his carbohydrate intake is significantly larger and he is taking only insulin when the blood sugar is high Although he  has relatively good fasting blood sugars he tends to have relatively higher readings before supper and possibly after dinner also contributing to his high A1c.    Patient will do well with basal bolus insulin regimen which can be administered conveniently at the nursing home. However he does have variable diet and blood sugars should not be expected to be consistently controlled Also should do well with taking at least small doses of mealtime coverage with Humalog and extra for high readings over 200 His goal for treatment would be the best possible level of blood sugars without hypoglycemia  Complications: Appears to have some signs of peripheral neuropathy although his examination is limited by his attention span. Likely has peripheral vascular disease with absent pedal pulses. Urine microalbumin not available but was mildly increased in 2014  Renal function: His creatinine clearance is 52 indicating stage II kidney disease  BRADYCARDIA: This will be addressed by  PCP, not clear why he is on atenolol. History of mild microalbuminuria, will need follow-up; however since his blood pressure is low normal but not be a candidate for antihypertensives like ramipril  ?  Peripheral neuropathy or carpal tunnel syndrome in hands  Lower leg edema: Likely to be from venous insufficiency but will need assessment of urine protein also.  No clinical evidence of CHF  PLAN:   As in patient instructions. Stop glyburide because of age and stage II kidney disease and potential for severe hypoglycemia. Given instructions for starting small doses of mealtime coverage with Humalog and extra for high readings over 200. May consider increasing Lantus if fasting blood sugar increases with stopping glyburide Will review his blood sugars in one week and adjust insulin accordingly The treatment plan and rationale were explained to his son and the patient   Patient Instructions  Check blood sugar at 8 PM every second day  STOP GLYBURIDE  HUMALOG insulin: He will get a fixed dose before each meal regardless of blood sugar as follows:  BREAKFAST = 2 units.  LUNCH = 3 units.  DINNER = 3 units  Change sliding scale Humalog as follows and this is to be given IN addition to the above fixed dose:  Blood sugar under 200: No insulin Blood sugar 200-249 = 1 unit 250 and above = 2 units  Please fax  legible records of his blood sugar on 02/04/15 to the following [phone]    Total visit time including review of multiple outside records, previous hospital labs, counseling, instructions to nursing home and review of outside blood sugar logs = 60 minutes   Javier Murillo 01/30/2015, 12:10 PM   Note: This office note was prepared with Insurance underwriter. Any transcriptional errors that result from this process are unintentional.

## 2015-02-04 ENCOUNTER — Other Ambulatory Visit: Payer: Self-pay

## 2015-02-04 ENCOUNTER — Telehealth: Payer: Self-pay | Admitting: Endocrinology

## 2015-02-04 NOTE — Telephone Encounter (Signed)
Ok to change to 33 units?

## 2015-02-04 NOTE — Telephone Encounter (Signed)
[facility]

## 2015-02-04 NOTE — Telephone Encounter (Signed)
[facility]  Clarification orders have been sent can we give them clarification on gliburide and humalog.  # [id number]

## 2015-02-04 NOTE — Telephone Encounter (Signed)
Per last visit from 2.24.2016:  HUMALOG insulin: He will get a fixed dose before each meal regardless of blood sugar as follows:  BREAKFAST = 2 units. LUNCH = 3 units. DINNER = 3 units  Change sliding scale Humalog as follows and this is to be given IN addition to the above fixed dose:  Blood sugar under 200: No insulin Blood sugar 200-249 = 1 unit 250 and above = 2 units  And Stop Glyburide

## 2015-02-04 NOTE — Telephone Encounter (Signed)
Form has been faxed back and confirmation received.

## 2015-02-04 NOTE — Telephone Encounter (Signed)
Yes okay 

## 2015-02-04 NOTE — Telephone Encounter (Signed)
[phone] is the son in law and POA Thayer OhmChris please call once the form is filled out and [facility]

## 2015-02-04 NOTE — Telephone Encounter (Signed)
lantus dosage needs to say 33 u daily please adjust

## 2015-02-28 ENCOUNTER — Ambulatory Visit (INDEPENDENT_AMBULATORY_CARE_PROVIDER_SITE_OTHER): Payer: Medicare Other | Admitting: Endocrinology

## 2015-02-28 ENCOUNTER — Encounter: Payer: Self-pay | Admitting: Endocrinology

## 2015-02-28 VITALS — BP 130/78 | HR 46 | Temp 97.5°F | Ht 72.0 in | Wt 172.0 lb

## 2015-02-28 DIAGNOSIS — IMO0002 Reserved for concepts with insufficient information to code with codable children: Secondary | ICD-10-CM

## 2015-02-28 DIAGNOSIS — E1165 Type 2 diabetes mellitus with hyperglycemia: Secondary | ICD-10-CM | POA: Diagnosis not present

## 2015-02-28 NOTE — Progress Notes (Signed)
Patient ID: Javier Murillo, male   DOB: 06/27/1930, 81 y.o.   MRN: [id number]           Reason for Appointment: Follow-up for Type 2 Diabetes  Referring physician: [name]  History of Present Illness:          Diagnosis: Type 2 diabetes mellitus, date of diagnosis:1975        Past history: He may have been treated with oral agents for the first few years of his diabetes management He thinks he has been on insulin for several years, uncertain how long. For some reason he has been continued on glyburide also which she probably has been taking for a long time Not clear if he had taken metformin or why it was stopped He probably has been on Lantus for quite some time He has had fair control of his diabetes over the last 2-3 years with A1c ranging from 7.4-9.3, relatively higher since 10/2013  Recent history:   On his initial consultation he had been on a fixed dose of Lantus along with sliding scale Humalog based on blood sugar levels before meals Because of persistently high A1c of 9% or more since 06/2014 he seen in consultation and the following changes were made to his Humalog insulin: BREAKFAST = 2 units. LUNCH = 3 units. DINNER = 3 units  Change sliding scale Humalog as follows and this is to be given IN addition to the above fixed dose:  Blood sugar under 200: No insulin  Blood sugar 200-249 = 1 unit 250 and above = 2 units  He is also taking 33 units of Lantus in the morning consistently His glyburide stopped because of his age and needing basal bolus insulin anyway His son says that they treatment regimen of insulin and oral agents has been somewhat inconsistent at nursing home but more recently instructions are being followed accurately He was also advised to have blood sugars checked after supper periodically which have been done at times  Diet: He is generally ordering whatever he wants on his menu at the nursing home and may frequently eat ice cream Hypoglycemia: None     Oral hypoglycemic drugs the patient is taking are: Glyburide 10 mg a.m., 5 mg at noon      Side effects from medications have been: None INSULIN regimen is described as:   Lantus 33 units in a.m., Humalog as above  Glucose monitoring:  done 2-3 times a day Glucometer: Unknown       Blood Glucose readings by time of day and averages from home record recently  PRE-MEAL Breakfast Lunch Dinner Bedtime Overall  Glucose range:  69 67   86-203   157-270  ?     Mean/median:         Self-care: The diet that the patient has been following is: None, patient is generally eating liberal diet which he orders at his nursing home    Meals: 3 meals per day. Breakfast is eggs, toast, sausage.  Lunch and dinner usually a meat or fish along with vegetables, bread and sugar-free ice cream.  Usually has sugar-free snacks           Physical activity:  none, in wheelchair         Dietician visit, most recent: Never .               Weight history:  Wt Readings from Last 3 Encounters:  02/28/15 172 lb (78.019 kg)  01/30/15 172 lb 6.4 oz (  78.2 kg)  09/05/14 156 lb 4.9 oz (70.9 kg)    Glycemic control:   Lab Results  Component Value Date   HGBA1C 9.3* 12/18/2014   HGBA1C 8.8* 07/24/2014   Lab Results  Component Value Date   LDLCALC 35 07/24/2014   CREATININE 1.3 12/18/2014    Urine microalbumin/creatinine ratio 50.4 done on 10/27/13     Medication List       This list is accurate as of: 02/28/15  4:53 PM.  Always use your most recent med list.               atenolol 25 MG tablet  Commonly known as:  TENORMIN  Take 12.5 mg by mouth daily.     cholecalciferol 1000 UNITS tablet  Commonly known as:  VITAMIN D  Take 1,000 Units by mouth every morning.     ferrous sulfate 325 (65 FE) MG tablet  Take 325 mg by mouth daily with breakfast.     FLUoxetine 20 MG capsule  Commonly known as:  PROZAC  Take 20 mg by mouth daily.     glyBURIDE 5 MG tablet  Commonly known as:  DIABETA      HUMALOG KWIKPEN 100 UNIT/ML KiwkPen  Generic drug:  insulin lispro     insulin aspart 100 UNIT/ML injection  Commonly known as:  novoLOG  Inject into the skin as needed for high blood sugar.     insulin glargine 100 UNIT/ML injection  Commonly known as:  LANTUS  Inject 19 Units into the skin daily.        Allergies:  Allergies  Allergen Reactions  . Fluvirin [Influenza Virus Vaccine Split] Other (See Comments)    Gave patient flu    Past Medical History  Diagnosis Date  . Quadriplegia and quadriparesis 08/20/2014  . Diabetic peripheral neuropathy   . Gait disorder   . Peripheral vascular disease   . Degenerative arthritis   . Lumbosacral spondylosis   . Depression   . Chronic low back pain   . Kidney stones     only once  . Hypertension   . Hyperlipidemia   . Arthritis   . Diabetes mellitus without complication     Past Surgical History  Procedure Laterality Date  . Shoulder surgery Bilateral   . Cataract extraction w/ intraocular lens  implant, bilateral    . Bursitis Bilateral     olecranon I&D  . Lumbar spine surgery      x7  . Back surgery      x 7  . Eye surgery      bilateral cataracts  . Bone spur removed  right sholder    . Appendectomy    . Anterior cervical decomp/discectomy fusion N/A 08/22/2014    Procedure: ANTERIOR CERVICAL DECOMPRESSION/DISCECTOMY FUSION, cervical three-four;  Surgeon: Hewitt Shorts, MD;  Location: [facility];  Service: Neurosurgery;  Laterality: N/A;  C3-4 anterior cervical decompression with fusion plating and bonegraft    Family History  Problem Relation Age of Onset  . Cancer Mother   . Heart attack Father   . Dementia Sister     Social History:  reports that he has quit smoking. His smoking use included Cigarettes. He has a 10 pack-year smoking history. He does not have any smokeless tobacco history on file. He reports that he does not drink alcohol or use illicit drugs.    Review of Systems         Lipids:  not on any medications  Lab Results  Component Value Date   CHOL 108 07/24/2014   HDL 59 07/24/2014   LDLCALC 35 07/24/2014   TRIG 68 07/24/2014   CHOLHDL 1.8 07/24/2014                He does have weakness in his legs from central cord problems previously and not able to ambulate   Physical Examination:  BP 130/78 mmHg  Pulse 46  Temp(Src) 97.5 F (36.4 C) (Oral)  Ht 6' (1.829 m)  Wt 172 lb (78.019 kg)  BMI 23.32 kg/m2  SpO2 95%  GENERAL: He is alert and conversant .       EXTREMITIES:     There is 1+ lower leg and pedal edema. No tenderness   ASSESSMENT:  Diabetes type 2, uncontrolled with last A1c of 9.3%. He has had long-standing diabetes and is requiring basal bolus insulin regimen  Currently taking mostly basal insulin with small doses of mealtime coverage.  His blood sugars appear to be improved with trying to get consistent coverage for all his meals with set doses of insulin rather than as needed Most of her sugars are excellent in the morning and reasonably good at lunchtime Sugars are generally higher at suppertime with only one reading below 200 No readings after supper  PLAN:   Increase lunchtime coverage by 1 unit to 3 units and reduce Lantus to 30 units  Kinte Trim 02/28/2015, 4:53 PM   Note: This office note was prepared with Dragon voice recognition system technology. Any transcriptional errors that result from this process are unintentional.

## 2015-02-28 NOTE — Patient Instructions (Signed)
FAX GLUCOSE READINGS TO [phone]   SEND these readings with patient on each visit

## 2015-04-08 ENCOUNTER — Telehealth: Payer: Self-pay | Admitting: Endocrinology

## 2015-04-08 ENCOUNTER — Other Ambulatory Visit (INDEPENDENT_AMBULATORY_CARE_PROVIDER_SITE_OTHER): Payer: Medicare Other

## 2015-04-08 DIAGNOSIS — IMO0002 Reserved for concepts with insufficient information to code with codable children: Secondary | ICD-10-CM

## 2015-04-08 DIAGNOSIS — E1165 Type 2 diabetes mellitus with hyperglycemia: Secondary | ICD-10-CM | POA: Diagnosis not present

## 2015-04-08 LAB — COMPREHENSIVE METABOLIC PANEL
ALT: 14 U/L (ref 0–53)
AST: 17 U/L (ref 0–37)
Albumin: 3.9 g/dL (ref 3.5–5.2)
Alkaline Phosphatase: 66 U/L (ref 39–117)
BUN: 22 mg/dL (ref 6–23)
CHLORIDE: 103 meq/L (ref 96–112)
CO2: 29 meq/L (ref 19–32)
Calcium: 9.8 mg/dL (ref 8.4–10.5)
Creatinine, Ser: 1.39 mg/dL (ref 0.40–1.50)
GFR: 51.89 mL/min — AB (ref >60.00)
Glucose, Bld: 99 mg/dL (ref 70–99)
Potassium: 4 mEq/L (ref 3.5–5.1)
SODIUM: 139 meq/L (ref 135–145)
TOTAL PROTEIN: 6.7 g/dL (ref 6.0–8.3)
Total Bilirubin: 0.5 mg/dL (ref 0.2–1.2)

## 2015-04-08 LAB — LIPID PANEL
CHOLESTEROL: 169 mg/dL (ref 0–200)
HDL: 54.8 mg/dL (ref >39.00)
LDL Cholesterol: 99 mg/dL (ref 0–99)
NonHDL: 114.2
TRIGLYCERIDES: 78 mg/dL (ref 0.0–149.0)
Total CHOL/HDL Ratio: 3
VLDL: 15.6 mg/dL (ref 0.0–40.0)

## 2015-04-08 LAB — HEMOGLOBIN A1C: Hgb A1c MFr Bld: 9.5 % — ABNORMAL HIGH (ref 4.6–6.5)

## 2015-04-08 NOTE — Telephone Encounter (Signed)
Recent sugars by fax [phone] for 3 days, no change in insulin

## 2015-04-11 ENCOUNTER — Encounter: Payer: Self-pay | Admitting: Endocrinology

## 2015-04-11 ENCOUNTER — Ambulatory Visit (INDEPENDENT_AMBULATORY_CARE_PROVIDER_SITE_OTHER): Payer: Medicare Other | Admitting: Endocrinology

## 2015-04-11 VITALS — BP 138/84 | HR 53 | Temp 97.9°F | Resp 16 | Ht 72.0 in | Wt 175.6 lb

## 2015-04-11 DIAGNOSIS — IMO0002 Reserved for concepts with insufficient information to code with codable children: Secondary | ICD-10-CM

## 2015-04-11 DIAGNOSIS — E1165 Type 2 diabetes mellitus with hyperglycemia: Secondary | ICD-10-CM | POA: Diagnosis not present

## 2015-04-11 NOTE — Patient Instructions (Signed)
See written instructions

## 2015-04-11 NOTE — Progress Notes (Signed)
Patient ID: Javier SerRobert L Murillo, male   DOB: [birth year], 81 y.o.   MRN: [id number]           Reason for Appointment: Follow-up for Type 2 Diabetes  Referring physician: [name]  History of Present Illness:          Diagnosis: Type 2 diabetes mellitus, date of diagnosis:1975        Past history: He may have been treated with oral agents for the first few years of his diabetes management He thinks he has been on insulin for several years, uncertain how long. For some reason he has been continued on glyburide also which she probably has been taking for a long time Not clear if he had taken metformin or why it was stopped He probably has been on Lantus for quite some time He has had fair control of his diabetes over the last 2-3 years with A1c ranging from 7.4-9.3, relatively higher since 10/2013  Recent history:   INSULIN regimen is described as:   Lantus 33 units in a.m.,  Humalog 2 units at breakfast and 3 units at lunch and supper as above Correction doses for high readings: If blood sugar below 200: No insulin  Blood sugar 200-249 = 1 unit 250 and above = 2 units  On his initial consultation he had been on a fixed dose of Lantus along with sliding scale Humalog based on blood sugar levels before meals Because of persistently high A1c of 9% or more since 06/2014 he seen in consultation and he was started on pre-meal insulin along with correction doses His glyburide stopped because of his age and needing basal bolus insulin anyway On his follow-up visit he was told to increase to lunchtime dose to 4 units because of high readings at suppertime and the Lantus was reduced to 30 units but apparently the instructions have not been followed Difficult to get a clear-cut records from his rest [facility] as [facility]  More recently blood sugars appear to be somewhat better even the liver high frequently for early part of April No hypoglycemia He was also advised to have blood  sugars checked after supper periodically which have been done at times  Diet: He is generally ordering whatever he wants on his menu at the nursing home and may  eat ice cream He is eating eggs, meat and toast at breakfast Hypoglycemia: None         Oral hypoglycemic drugs the patient is taking are: Glyburide 10 mg a.m., 5 mg at noon      Side effects from medications have been: None  Glucose monitoring:  done 2-3 times a day Glucometer: Unknown       Blood Glucose readings by time of day and averages from home record recently  PRE-MEAL Breakfast Lunch Dinner Bedtime Overall  Glucose range: 125-172 150-350  150-250   124, 164    Average:  about 150   210   180      Self-care: The diet that the patient has been following is: None, patient is generally eating liberal diet which he orders at his nursing home    Meals: 3 meals per day. Breakfast is eggs, toast, sausage.  Lunch and dinner usually a meat or fish along with vegetables, bread and sugar-free ice cream.  Usually has sugar-free snacks           Physical activity:  none, in wheelchair         Dietician  visit, most recent: Never .               Weight history:  Wt Readings from Last 3 Encounters:  04/11/15 175 lb 9.6 oz (79.652 kg)  02/28/15 172 lb (78.019 kg)  01/30/15 172 lb 6.4 oz (78.2 kg)    Glycemic control:   Lab Results  Component Value Date   HGBA1C 9.5* 04/08/2015   HGBA1C 9.3* 12/18/2014   HGBA1C 8.8* 07/24/2014   Lab Results  Component Value Date   LDLCALC 99 04/08/2015   CREATININE 1.39 04/08/2015    Urine microalbumin/creatinine ratio 50.4 done on 10/27/13     Medication List       This list is accurate as of: 04/11/15  4:47 PM.  Always use your most recent med list.               atenolol 25 MG tablet  Commonly known as:  TENORMIN  Take 12.5 mg by mouth daily.     bisacodyl 10 MG suppository  Commonly known as:  DULCOLAX  1 suppository as needed     cholecalciferol 1000 UNITS tablet    Commonly known as:  VITAMIN D  Take 1,000 Units by mouth every morning.     ferrous sulfate 325 (65 FE) MG tablet  Take 325 mg by mouth daily with breakfast.     FLUoxetine 20 MG capsule  Commonly known as:  PROZAC  Take 20 mg by mouth daily.     fluticasone 50 MCG/ACT nasal spray  Commonly known as:  FLONASE     HUMALOG 100 UNIT/ML injection  Generic drug:  insulin lispro     insulin aspart 100 UNIT/ML injection  Commonly known as:  novoLOG  Inject into the skin as needed for high blood sugar.     insulin glargine 100 UNIT/ML injection  Commonly known as:  LANTUS  Inject 19 Units into the skin daily.        Allergies:  Allergies  Allergen Reactions  . Fluvirin [Influenza Virus Vaccine Split] Other (See Comments)    Gave patient flu    Past Medical History  Diagnosis Date  . Quadriplegia and quadriparesis 08/20/2014  . Diabetic peripheral neuropathy   . Gait disorder   . Peripheral vascular disease   . Degenerative arthritis   . Lumbosacral spondylosis   . Depression   . Chronic low back pain   . Kidney stones     only once  . Hypertension   . Hyperlipidemia   . Arthritis   . Diabetes mellitus without complication     Past Surgical History  Procedure Laterality Date  . Shoulder surgery Bilateral   . Cataract extraction w/ intraocular lens  implant, bilateral    . Bursitis Bilateral     olecranon I&D  . Lumbar spine surgery      x7  . Back surgery      x 7  . Eye surgery      bilateral cataracts  . Bone spur removed  right sholder    . Appendectomy    . Anterior cervical decomp/discectomy fusion N/A 08/22/2014    Procedure: ANTERIOR CERVICAL DECOMPRESSION/DISCECTOMY FUSION, cervical three-four;  Surgeon: Hewitt Shortsobert W Nudelman, MD;  Location: [facility];  Service: Neurosurgery;  Laterality: N/A;  C3-4 anterior cervical decompression with fusion plating and bonegraft    Family History  Problem Relation Age of Onset  . Cancer Mother   . Heart attack  Father   . Dementia Sister  Social History:  reports that he has quit smoking. His smoking use included Cigarettes. He has a 10 pack-year smoking history. He does not have any smokeless tobacco history on file. He reports that he does not drink alcohol or use illicit drugs.    Review of Systems         Lipids: not on any medications for hyperlipidemia       Lab Results  Component Value Date   CHOL 169 04/08/2015   HDL 54.80 04/08/2015   LDLCALC 99 04/08/2015   TRIG 78.0 04/08/2015   CHOLHDL 3 04/08/2015                He does have weakness in his legs from central cord problems previously and not able to ambulate Also some numbness in fingers   Physical Examination:  BP 138/84 mmHg  Pulse 53  Temp(Src) 97.9 F (36.6 C)  Resp 16  Ht 6' (1.829 m)  Wt 175 lb 9.6 oz (79.652 kg)  BMI 23.81 kg/m2  SpO2 96%  GENERAL: He is alert and conversant .       ASSESSMENT:  Diabetes type 2, uncontrolled with last A1c of 9.3%. He has had long-standing diabetes and is requiring basal bolus insulin regimen  Currently taking relatively high dose of basal insulin with small doses of mealtime coverage. His A1c is still high over 9% but it has not been 3 months since his insulin changes were made  His blood sugars appear to be improved more recently even though his insulin has not been changed much Not clear if he is consistently getting extra insulin for high readings and if all the instructions for mealtime insulin are being followed as the records of his MAR are somewhat inconsistent Fasting readings are only mildly increased and relatively more consistent  PLAN:   Increase breakfast coverage by 1 unit to 3 units and continue same dose of Lantus Follow-up records to be reviewed in 10 days  [facility] 04/11/2015, 4:47 PM   Note: This office note was prepared with Insurance underwriter. Any transcriptional errors that result from this process are  unintentional.

## 2015-06-20 ENCOUNTER — Telehealth: Payer: Self-pay | Admitting: Endocrinology

## 2015-06-20 NOTE — Telephone Encounter (Signed)
Glucose readings by fax show most blood sugars after supper over 200.  Will increase suppertime dose to 4 units, order faxed

## 2015-06-25 ENCOUNTER — Ambulatory Visit: Payer: Medicare Other | Admitting: Endocrinology

## 2015-07-08 ENCOUNTER — Ambulatory Visit (INDEPENDENT_AMBULATORY_CARE_PROVIDER_SITE_OTHER): Payer: Medicare Other | Admitting: Endocrinology

## 2015-07-08 ENCOUNTER — Encounter: Payer: Self-pay | Admitting: Endocrinology

## 2015-07-08 VITALS — BP 134/80 | HR 50 | Temp 98.2°F | Resp 14 | Ht 72.0 in | Wt 170.8 lb

## 2015-07-08 DIAGNOSIS — E1165 Type 2 diabetes mellitus with hyperglycemia: Secondary | ICD-10-CM

## 2015-07-08 DIAGNOSIS — IMO0002 Reserved for concepts with insufficient information to code with codable children: Secondary | ICD-10-CM

## 2015-07-08 LAB — POCT GLYCOSYLATED HEMOGLOBIN (HGB A1C): Hemoglobin A1C: 8.5

## 2015-07-08 NOTE — Patient Instructions (Addendum)
Increase HUMALOG at supper from 4 to 5 units  Fax me his insulin doses to confirm  Send sugar log to me every 2 weeks

## 2015-07-08 NOTE — Progress Notes (Signed)
Patient ID: Javier Murillo, male   DOB: 12-13-1929, 81 y.o.   MRN: [id number]           Reason for Appointment: Follow-up for Type 2 Diabetes  Referring physician: Zachery Dauer  History of Present Illness:          Diagnosis: Type 2 diabetes mellitus, date of diagnosis:1975        Past history: He may have been treated with oral agents for the first few years of his diabetes management He thinks he has been on insulin for several years, uncertain how long. For some reason he has been continued on glyburide also which she probably has been taking for a long time Not clear if he had taken metformin or why it was stopped He probably has been on Lantus for quite some time He has had fair control of his diabetes over the last 2-3 years with A1c ranging from 7.4-9.3, relatively higher since 10/2013  On his initial consultation he had been on a fixed dose of Lantus along with sliding scale Humalog based on blood sugar levels before meals Because of persistently high A1c of 9% or more since 06/2014 he seen in consultation and he was started on pre-meal insulin along with correction doses His glyburide stopped because of his age and needing basal bolus insulin   Recent history:   INSULIN regimen is described as:   Lantus 33 units in a.m., Humalog 3 units at breakfast and 3 units at lunch and 4 supper Correction doses for high readings: If blood sugar below 200: No insulin  Blood sugar 200-249 = 1 unit 250 and above = 2 units    His insulin doses have been periodically adjusted based on his blood sugars sent by the fax from the nursing home.  Today his insulin doses could not be confirmed as no paperwork was sent from the  Nursing home.   Current blood sugar patterns and problems identified:  His fasting blood sugars are near normal with a range of 110-219 but mostly fairly good  More recently blood sugars appear to be fairly good at lunch    Glucose readings at suppertime ranging from  110-306.  Last week's bedtime readings range from 300-308 No hypoglycemia  Diet: He is generally ordering whatever he wants on his menu at the nursing home and he usually finishes his meal.  He thinks he does not order any dessert  He is eating eggs, meat and toast at breakfast Hypoglycemia: None         Oral hypoglycemic drugs the patient is taking are:       Side effects from medications have been: None  Glucose monitoring:  done 2-3 times a day Glucometer: Unknown       Blood Glucose readings by time of day and averages from recent fax As above   Self-care: The diet that the patient has been following is: None, patient is generally eating liberal diet which he orders at his nursing home    Meals: 3 meals per day. Breakfast is eggs, toast, sausage.  Lunch and dinner usually a meat or fish along with vegetables, bread and sugar-free ice cream.  Usually has sugar-free snacks           Physical activity:  none, in wheelchair         Dietician visit, most recent: Never .               Weight history:  Wt Readings from Last 3  Encounters:  07/08/15 170 lb 12.8 oz (77.474 kg)  04/11/15 175 lb 9.6 oz (79.652 kg)  02/28/15 172 lb (78.019 kg)    Glycemic control:   Lab Results  Component Value Date   HGBA1C 8.5 07/08/2015   HGBA1C 9.5* 04/08/2015   HGBA1C 9.3* 12/18/2014   Lab Results  Component Value Date   LDLCALC 99 04/08/2015   CREATININE 1.39 04/08/2015    Urine microalbumin/creatinine ratio 50.4 done on 10/27/13     Medication List       This list is accurate as of: 07/08/15  5:17 PM.  Always use your most recent med list.               atenolol 25 MG tablet  Commonly known as:  TENORMIN  Take 12.5 mg by mouth daily.     bisacodyl 10 MG suppository  Commonly known as:  DULCOLAX  1 suppository as needed     cholecalciferol 1000 UNITS tablet  Commonly known as:  VITAMIN D  Take 1,000 Units by mouth every morning.     ferrous sulfate 325 (65 FE) MG tablet   Take 325 mg by mouth daily with breakfast.     FLUoxetine 20 MG capsule  Commonly known as:  PROZAC  Take 20 mg by mouth daily.     fluticasone 50 MCG/ACT nasal spray  Commonly known as:  FLONASE     HUMALOG 100 UNIT/ML injection  Generic drug:  insulin lispro  ss     insulin glargine 100 UNIT/ML injection  Commonly known as:  LANTUS  Inject 19 Units into the skin daily.        Allergies:  Allergies  Allergen Reactions  . Fluvirin [Influenza Virus Vaccine Split] Other (See Comments)    Gave patient flu    Past Medical History  Diagnosis Date  . Quadriplegia and quadriparesis 08/20/2014  . Diabetic peripheral neuropathy   . Gait disorder   . Peripheral vascular disease   . Degenerative arthritis   . Lumbosacral spondylosis   . Depression   . Chronic low back pain   . Kidney stones     only once  . Hypertension   . Hyperlipidemia   . Arthritis   . Diabetes mellitus without complication     Past Surgical History  Procedure Laterality Date  . Shoulder surgery Bilateral   . Cataract extraction w/ intraocular lens  implant, bilateral    . Bursitis Bilateral     olecranon I&D  . Lumbar spine surgery      x7  . Back surgery      x 7  . Eye surgery      bilateral cataracts  . Bone spur removed  right sholder    . Appendectomy    . Anterior cervical decomp/discectomy fusion N/A 08/22/2014    Procedure: ANTERIOR CERVICAL DECOMPRESSION/DISCECTOMY FUSION, cervical three-four;  Surgeon: Hewitt Shorts, MD;  Location: [facility];  Service: Neurosurgery;  Laterality: N/A;  C3-4 anterior cervical decompression with fusion plating and bonegraft    Family History  Problem Relation Age of Onset  . Cancer Mother   . Heart attack Father   . Dementia Sister     Social History:  reports that he has quit smoking. His smoking use included Cigarettes. He has a 10 pack-year smoking history. He does not have any smokeless tobacco history on file. He reports that he does  not drink alcohol or use illicit drugs.    Review of Systems  Lipids: not on any medications for hyperlipidemia       Lab Results  Component Value Date   CHOL 169 04/08/2015   HDL 54.80 04/08/2015   LDLCALC 99 04/08/2015   TRIG 78.0 04/08/2015   CHOLHDL 3 04/08/2015               He does have weakness in his legs from central cord problems previously and not able to ambulate Also some numbness in fingers   Physical Examination:  BP 134/80 mmHg  Pulse 50  Temp(Src) 98.2 F (36.8 C)  Resp 14  Ht 6' (1.829 m)  Wt 170 lb 12.8 oz (77.474 kg)  BMI 23.16 kg/m2  SpO2 96%  GENERAL: He is alpleasant    ASSESSMENT:  Diabetes type 2, uncontrolled with  improved A1c of 8.5, previously over 9% He has had long-standing diabetes and is requiring basal bolus insulin regimen  Currently taking relatively high dose of basal insulin with small doses of mealtime coverage. His blood sugars are improving but inconsistent at suppertime Blood sugars are recently appeared to be mostly over 300 after supper. Not clear if this is related to his diet or needing more insulin recently at suppertime Fasting readings are only mildly increased and relatively more consistent  Also unable to confirm his insulin doses as records from nursing home not available  PLAN:   Increase suppertime coverage by 1 unit again and he will take 5 units For now he will continue same dose of Lantus Follow-up records to be reviewed  every 2 weeks  Monike Bragdon 07/08/2015, 5:17 PM   Note: This office note was prepared with Insurance underwriter. Any transcriptional errors that result from this process are unintentional.

## 2015-09-15 ENCOUNTER — Observation Stay (HOSPITAL_COMMUNITY): Payer: Medicare Other

## 2015-09-15 ENCOUNTER — Encounter (HOSPITAL_COMMUNITY): Payer: Self-pay | Admitting: Emergency Medicine

## 2015-09-15 ENCOUNTER — Inpatient Hospital Stay (HOSPITAL_COMMUNITY)
Admission: EM | Admit: 2015-09-15 | Discharge: 2015-09-17 | DRG: 070 | Disposition: A | Discharge status: Skilled Nursing Facility | Payer: Medicare Other | Attending: Family Medicine | Admitting: Family Medicine

## 2015-09-15 DIAGNOSIS — M545 Low back pain: Secondary | ICD-10-CM | POA: Diagnosis present

## 2015-09-15 DIAGNOSIS — E876 Hypokalemia: Secondary | ICD-10-CM | POA: Diagnosis present

## 2015-09-15 DIAGNOSIS — E785 Hyperlipidemia, unspecified: Secondary | ICD-10-CM | POA: Diagnosis present

## 2015-09-15 DIAGNOSIS — Z887 Allergy status to serum and vaccine status: Secondary | ICD-10-CM

## 2015-09-15 DIAGNOSIS — G8250 Quadriplegia, unspecified: Secondary | ICD-10-CM | POA: Diagnosis present

## 2015-09-15 DIAGNOSIS — R001 Bradycardia, unspecified: Secondary | ICD-10-CM | POA: Diagnosis present

## 2015-09-15 DIAGNOSIS — N183 Chronic kidney disease, stage 3 (moderate): Secondary | ICD-10-CM | POA: Diagnosis present

## 2015-09-15 DIAGNOSIS — Z794 Long term (current) use of insulin: Secondary | ICD-10-CM | POA: Diagnosis not present

## 2015-09-15 DIAGNOSIS — N179 Acute kidney failure, unspecified: Secondary | ICD-10-CM | POA: Diagnosis present

## 2015-09-15 DIAGNOSIS — R4182 Altered mental status, unspecified: Secondary | ICD-10-CM | POA: Diagnosis present

## 2015-09-15 DIAGNOSIS — E1142 Type 2 diabetes mellitus with diabetic polyneuropathy: Secondary | ICD-10-CM | POA: Diagnosis present

## 2015-09-15 DIAGNOSIS — N178 Other acute kidney failure: Secondary | ICD-10-CM | POA: Diagnosis not present

## 2015-09-15 DIAGNOSIS — J189 Pneumonia, unspecified organism: Secondary | ICD-10-CM | POA: Diagnosis present

## 2015-09-15 DIAGNOSIS — E1122 Type 2 diabetes mellitus with diabetic chronic kidney disease: Secondary | ICD-10-CM | POA: Diagnosis present

## 2015-09-15 DIAGNOSIS — Z87891 Personal history of nicotine dependence: Secondary | ICD-10-CM

## 2015-09-15 DIAGNOSIS — I129 Hypertensive chronic kidney disease with stage 1 through stage 4 chronic kidney disease, or unspecified chronic kidney disease: Secondary | ICD-10-CM | POA: Diagnosis present

## 2015-09-15 DIAGNOSIS — J9601 Acute respiratory failure with hypoxia: Secondary | ICD-10-CM | POA: Diagnosis present

## 2015-09-15 DIAGNOSIS — E860 Dehydration: Secondary | ICD-10-CM | POA: Diagnosis present

## 2015-09-15 DIAGNOSIS — G8929 Other chronic pain: Secondary | ICD-10-CM | POA: Diagnosis present

## 2015-09-15 DIAGNOSIS — Z79899 Other long term (current) drug therapy: Secondary | ICD-10-CM

## 2015-09-15 DIAGNOSIS — I739 Peripheral vascular disease, unspecified: Secondary | ICD-10-CM | POA: Diagnosis present

## 2015-09-15 DIAGNOSIS — R5383 Other fatigue: Secondary | ICD-10-CM

## 2015-09-15 DIAGNOSIS — M47817 Spondylosis without myelopathy or radiculopathy, lumbosacral region: Secondary | ICD-10-CM | POA: Diagnosis present

## 2015-09-15 DIAGNOSIS — G9340 Encephalopathy, unspecified: Secondary | ICD-10-CM | POA: Diagnosis present

## 2015-09-15 DIAGNOSIS — E1140 Type 2 diabetes mellitus with diabetic neuropathy, unspecified: Secondary | ICD-10-CM | POA: Diagnosis present

## 2015-09-15 DIAGNOSIS — R0600 Dyspnea, unspecified: Secondary | ICD-10-CM | POA: Diagnosis not present

## 2015-09-15 DIAGNOSIS — E0841 Diabetes mellitus due to underlying condition with diabetic mononeuropathy: Secondary | ICD-10-CM

## 2015-09-15 DIAGNOSIS — E0840 Diabetes mellitus due to underlying condition with diabetic neuropathy, unspecified: Secondary | ICD-10-CM | POA: Diagnosis not present

## 2015-09-15 LAB — CBC WITH DIFFERENTIAL/PLATELET
Basophils Absolute: 0 10*3/uL (ref 0.0–0.1)
Basophils Relative: 0 %
Eosinophils Absolute: 0.1 10*3/uL (ref 0.0–0.7)
Eosinophils Relative: 1 %
HEMATOCRIT: 37.1 % — AB (ref 39.0–52.0)
Hemoglobin: 12 g/dL — ABNORMAL LOW (ref 13.0–17.0)
LYMPHS ABS: 1.7 10*3/uL (ref 0.7–4.0)
LYMPHS PCT: 16 %
MCH: 32.6 pg (ref 26.0–34.0)
MCHC: 32.3 g/dL (ref 30.0–36.0)
MCV: 100.8 fL — AB (ref 78.0–100.0)
MONO ABS: 1.1 10*3/uL — AB (ref 0.1–1.0)
Monocytes Relative: 11 %
Neutro Abs: 7.5 10*3/uL (ref 1.7–7.7)
Neutrophils Relative %: 72 %
Platelets: 246 10*3/uL (ref 150–400)
RBC: 3.68 MIL/uL — ABNORMAL LOW (ref 4.22–5.81)
RDW: 12.3 % (ref 11.5–15.5)
WBC: 10.5 10*3/uL (ref 4.0–10.5)

## 2015-09-15 LAB — BASIC METABOLIC PANEL
Anion gap: 7 (ref 5–15)
BUN: 35 mg/dL — AB (ref 6–20)
CHLORIDE: 107 mmol/L (ref 101–111)
CO2: 27 mmol/L (ref 22–32)
Calcium: 9.3 mg/dL (ref 8.9–10.3)
Creatinine, Ser: 1.3 mg/dL — ABNORMAL HIGH (ref 0.61–1.24)
GFR calc Af Amer: 57 mL/min — ABNORMAL LOW (ref >60)
GFR, EST NON AFRICAN AMERICAN: 49 mL/min — AB (ref >60)
GLUCOSE: 82 mg/dL (ref 65–99)
Potassium: 3.9 mmol/L (ref 3.5–5.1)
Sodium: 141 mmol/L (ref 135–145)

## 2015-09-15 LAB — BLOOD GAS, ARTERIAL
ACID-BASE EXCESS: 0.9 mmol/L (ref 0.0–2.0)
Bicarbonate: 25.2 mEq/L — ABNORMAL HIGH (ref 20.0–24.0)
Drawn by: 295031
O2 Content: 2 L/min
O2 SAT: 98.1 %
PATIENT TEMPERATURE: 98.6
PCO2 ART: 41.6 mmHg (ref 35.0–45.0)
TCO2: 22.9 mmol/L (ref 0–100)
pH, Arterial: 7.4 (ref 7.350–7.450)
pO2, Arterial: 112 mmHg — ABNORMAL HIGH (ref 80.0–100.0)

## 2015-09-15 LAB — URINALYSIS, ROUTINE W REFLEX MICROSCOPIC
Bilirubin Urine: NEGATIVE
GLUCOSE, UA: NEGATIVE mg/dL
Ketones, ur: NEGATIVE mg/dL
Leukocytes, UA: NEGATIVE
Nitrite: NEGATIVE
PROTEIN: NEGATIVE mg/dL
Specific Gravity, Urine: 1.027 (ref 1.005–1.030)
Urobilinogen, UA: 0.2 mg/dL (ref 0.0–1.0)
pH: 5 (ref 5.0–8.0)

## 2015-09-15 LAB — URINE MICROSCOPIC-ADD ON

## 2015-09-15 LAB — CBG MONITORING, ED
Glucose-Capillary: 60 mg/dL — ABNORMAL LOW (ref 65–99)
Glucose-Capillary: 102 mg/dL — ABNORMAL HIGH (ref 65–99)

## 2015-09-15 LAB — PROCALCITONIN: Procalcitonin: <0.1 ng/mL

## 2015-09-15 LAB — GLUCOSE, CAPILLARY: GLUCOSE-CAPILLARY: 72 mg/dL (ref 65–99)

## 2015-09-15 LAB — MRSA PCR SCREENING: MRSA BY PCR: NEGATIVE

## 2015-09-15 MED ORDER — DEXTROSE 50 % IV SOLN
25.0000 g | Freq: Once | INTRAVENOUS | Status: AC
  Administered 2015-09-15: 25 g via INTRAVENOUS

## 2015-09-15 MED ORDER — NAPHAZOLINE HCL 0.1 % OP SOLN
1.0000 [drp] | Freq: Four times a day (QID) | OPHTHALMIC | Status: DC | PRN
  Filled 2015-09-15: qty 15

## 2015-09-15 MED ORDER — ONDANSETRON HCL 4 MG PO TABS: 4.0000 mg | ORAL_TABLET | Freq: Four times a day (QID) | ORAL | Status: DC | PRN

## 2015-09-15 MED ORDER — DEXTROSE 50 % IV SOLN
INTRAVENOUS | Status: AC
  Filled 2015-09-15: qty 50

## 2015-09-15 MED ORDER — ENOXAPARIN SODIUM 40 MG/0.4ML ~~LOC~~ SOLN
40.0000 mg | SUBCUTANEOUS | Status: DC
  Administered 2015-09-15 – 2015-09-16 (×2): 40 mg via SUBCUTANEOUS
  Filled 2015-09-15 (×2): qty 0.4

## 2015-09-15 MED ORDER — SODIUM CHLORIDE 0.9 % IV BOLUS (SEPSIS)
500.0000 mL | Freq: Once | INTRAVENOUS | Status: AC
  Administered 2015-09-15: 500 mL via INTRAVENOUS

## 2015-09-15 MED ORDER — ONDANSETRON HCL 4 MG/2ML IJ SOLN: 4.0000 mg | Freq: Four times a day (QID) | INTRAMUSCULAR | Status: DC | PRN

## 2015-09-15 MED ORDER — DEXTROSE 5 % IV SOLN
1.0000 g | Freq: Once | INTRAVENOUS | Status: AC
  Administered 2015-09-15: 1 g via INTRAVENOUS
  Filled 2015-09-15: qty 10

## 2015-09-15 MED ORDER — SODIUM CHLORIDE 0.9 % IV SOLN
INTRAVENOUS | Status: DC
Start: 2015-09-15 — End: 2015-09-17
  Administered 2015-09-15 – 2015-09-16 (×2): via INTRAVENOUS

## 2015-09-15 MED ORDER — SODIUM CHLORIDE 0.9 % IV SOLN
INTRAVENOUS | Status: DC
  Administered 2015-09-15: 15:00:00 via INTRAVENOUS

## 2015-09-15 MED ORDER — HYDRALAZINE HCL 20 MG/ML IJ SOLN
5.0000 mg | INTRAMUSCULAR | Status: DC | PRN
  Administered 2015-09-15: 5 mg via INTRAVENOUS
  Filled 2015-09-15: qty 1

## 2015-09-15 MED ORDER — INSULIN ASPART 100 UNIT/ML ~~LOC~~ SOLN
0.0000 [IU] | Freq: Three times a day (TID) | SUBCUTANEOUS | Status: DC
  Administered 2015-09-16: 2 [IU] via SUBCUTANEOUS
  Administered 2015-09-16: 1 [IU] via SUBCUTANEOUS
  Administered 2015-09-16: 2 [IU] via SUBCUTANEOUS
  Administered 2015-09-17: 1 [IU] via SUBCUTANEOUS

## 2015-09-15 MED ORDER — DEXTROSE 5 % IV SOLN
500.0000 mg | INTRAVENOUS | Status: DC
  Administered 2015-09-15 – 2015-09-16 (×2): 500 mg via INTRAVENOUS
  Filled 2015-09-15 (×2): qty 500

## 2015-09-15 MED ORDER — DEXTROSE 5 % IV SOLN
1.0000 g | INTRAVENOUS | Status: DC
  Administered 2015-09-16: 1 g via INTRAVENOUS
  Filled 2015-09-15: qty 10

## 2015-09-15 NOTE — ED Notes (Signed)
Family at bedside. 

## 2015-09-15 NOTE — ED Notes (Signed)
Per family pt lives at [facility] and mid week when pt had a fall, tiredness, "not being himself", and unable to get a urine specimen to rule out UTI.

## 2015-09-15 NOTE — ED Notes (Signed)
Report called to [place], RN 3 Chad

## 2015-09-15 NOTE — ED Notes (Signed)
Attempted to call report to 19 Chad.  RN will call me back

## 2015-09-15 NOTE — H&P (Addendum)
[facility] History and Physical  TAKUMI DIN ZOX:[id number] DOB: May 03, 1930 DOA: 09/15/2015  Referring physician: ED physician, Dr. Effie Shy PCP: Gaye Alken, MD   Chief Complaint: altered mental status   HPI:  Pt is 81 yo male with known HTN, decubitus ulcer, resident of ALF, presented to [facility] ED for evaluation of progressive confusion. Pt is unable to provide any history at the time of the admission and family at bedside able to provide some information. They explained that pt was ut having lunch with family 3 days PTA and appeared to be confused at times but has not voiced any concerns. At the facility he was noted be more confused and lethargic this AM. No reported chest pain or shortness of breath, no reported abd or urinary concerns, no reported fevers, chills.   In ED, pt is somnolent and difficult to awake. HR noted to be in 40- 50's and at one point oxygen saturation noted to be down to 60's. [facility] asked to admit for further evaluation.   Assessment and Plan:  Principal Problem:   Acute encephalopathy - unclear etiology at this time - UA unremarkable and CXR with no specific acute etiology - ? PNA reported on CXR but not very impressive - will place pt on empiric ABX for now until more data is back - monitor in SDU  Active Problems:   Acute renal failure (HCC) - appears to be pre renal in etiology - IVF will be provided and will repeat BMP In AM    Acute respiratory failure with hypoxia (HCC) - unclear etiology, not sure that CXR can really explain it either - d/w PCCM E-link, monitor pt closely in SDU - ABG requested    Bradycardia - hold Atenolol  - keep on cardiac monitor     Diabetes mellitus with neuropathy (HCC) - hold Lantus for now as pt is NPO - place on SSI only   DVT prophylaxis - Lovenox SQ  Radiological Exams on Admission: Dg Chest 2 View 09/15/2015   Low lung volumes limit examination. Basilar heterogeneous opacities may represent  atelectasis or infection. Component of interstitial process, particularly given the appearance on lateral view is not excluded.    Code Status: Full Family Communication: Family at bedside Disposition Plan: Admit for further evaluation    Danie Binder [facility] [phone]   Review of Systems:  Unable to obtain due to altered mental state     Past Medical History  Diagnosis Date  . Quadriplegia and quadriparesis (HCC) 08/20/2014  . Diabetic peripheral neuropathy (HCC)   . Gait disorder   . Peripheral vascular disease (HCC)   . Degenerative arthritis   . Lumbosacral spondylosis   . Depression   . Chronic low back pain   . Kidney stones     only once  . Hypertension   . Hyperlipidemia   . Arthritis   . Diabetes mellitus without complication [facility])     Past Surgical History  Procedure Laterality Date  . Shoulder surgery Bilateral   . Cataract extraction w/ intraocular lens  implant, bilateral    . Bursitis Bilateral     olecranon I&D  . Lumbar spine surgery      x7  . Back surgery      x 7  . Eye surgery      bilateral cataracts  . Bone spur removed  right sholder    . Appendectomy    . Anterior cervical decomp/discectomy fusion N/A 08/22/2014    Procedure: ANTERIOR CERVICAL DECOMPRESSION/DISCECTOMY  FUSION, cervical three-four;  Surgeon: Hewitt Shorts, MD;  Location: [facility];  Service: Neurosurgery;  Laterality: N/A;  C3-4 anterior cervical decompression with fusion plating and bonegraft    Social History:  reports that he has quit smoking. His smoking use included Cigarettes. He has a 10 pack-year smoking history. He does not have any smokeless tobacco history on file. He reports that he does not drink alcohol or use illicit drugs.  Allergies  Allergen Reactions  . Fluvirin [Influenza Virus Vaccine Split] Other (See Comments)    Gave patient flu    Family History  Problem Relation Age of Onset  . Cancer Mother   . Heart attack Father   . Dementia Sister      Medication Sig  atenolol (TENORMIN) 25 MG tablet Take 12.5 mg by mouth daily.  ferrous sulfate 325 (65 FE) MG tablet Take 325 mg by mouth daily with breakfast.  FLUoxetine (PROZAC) 20 MG capsule Take 20 mg by mouth daily.  HUMALOG 100 UNIT/ML injection Inject 3-4 Units into the skin 4 (four) times daily  insulin 100 UNIT/ML injection Inject 33 Units into the skin daily.    Physical Exam: Filed Vitals:   09/15/15 1210 09/15/15 1316  BP: 120/47   Pulse: 50   Temp: 98 F (36.7 C) 98.6 F (37 C)  TempSrc: Oral Rectal  Resp: 18   SpO2: 97%     Physical Exam  Constitutional: Appears somnolent, difficult to awake, NAD HENT: Normocephalic. External right and left ear normal. Dry M Eyes: PERRLA, no scleral icterus.  Neck: No JVD. No tracheal deviation. No thyromegaly.  CVS: Bradycardic, no gallops  Pulmonary: Effort and breath sounds normal, no stridor, diminished breath sounds at bases  Abdominal: Soft. BS +,  no distension, tenderness, rebound or guarding.  Musculoskeletal: Normal range of motion. +2 bilateral LE edema  Lymphadenopathy: No lymphadenopathy noted, cervical, inguinal. Neuro: somnolent, withdraws to sternal rub Skin: Skin is warm and dry. No rash noted. Not diaphoretic. No erythema. No pallor.  Psychiatric: Difficult to assess due to AMS  Labs on Admission:  Basic Metabolic Panel:  Recent Labs Lab 09/15/15 1233  NA 141  K 3.9  CL 107  CO2 27  GLUCOSE 82  BUN 35*  CREATININE 1.30*  CALCIUM 9.3   CBC:  Recent Labs Lab 09/15/15 1233  WBC 10.5  NEUTROABS 7.5  HGB 12.0*  HCT 37.1*  MCV 100.8*  PLT 246   EKG: pending  If 7PM-7AM, please contact night-coverage [email or web address] Password [email or web address] 09/15/2015, 2:56 PM

## 2015-09-15 NOTE — ED Notes (Signed)
Attempted to call report to stepdown.  RN will have to call me back

## 2015-09-15 NOTE — ED Notes (Signed)
Report called to Revonda Standard, RN - pt to be transported to 1233

## 2015-09-15 NOTE — ED Provider Notes (Signed)
CSN: [id number]     Arrival date & time 09/15/15  1200 History   First MD Initiated Contact with Patient 09/15/15 1222     Chief Complaint  Patient presents with  . Fatigue  . Urinary Retention     (Consider location/radiation/quality/duration/timing/severity/associated sxs/prior Treatment) The history is provided by the patient, the nursing home and a relative.    Javier Murillo is a 81 y.o. male who presents for evaluation of altered mental status, weakness, and increasing tiredness. This is unusual for the patient. He has been having trouble getting to the cafeteria to eat and drink. He lives in an assisted living facility. His doctor initiated care today for a sacral pressure sore. He had first visit, with nurse, to do that. He has been taking his medications as prescribed. No recent fever, chills, cough, stated shortness of breath or complaint of pain. There are no other known modifying factors.    Past Medical History  Diagnosis Date  . Quadriplegia and quadriparesis (HCC) 08/20/2014  . Diabetic peripheral neuropathy (HCC)   . Gait disorder   . Peripheral vascular disease (HCC)   . Degenerative arthritis   . Lumbosacral spondylosis   . Depression   . Chronic low back pain   . Kidney stones     only once  . Hypertension   . Hyperlipidemia   . Arthritis   . Diabetes mellitus without complication [facility])    Past Surgical History  Procedure Laterality Date  . Shoulder surgery Bilateral   . Cataract extraction w/ intraocular lens  implant, bilateral    . Bursitis Bilateral     olecranon I&D  . Lumbar spine surgery      x7  . Back surgery      x 7  . Eye surgery      bilateral cataracts  . Bone spur removed  right sholder    . Appendectomy    . Anterior cervical decomp/discectomy fusion N/A 08/22/2014    Procedure: ANTERIOR CERVICAL DECOMPRESSION/DISCECTOMY FUSION, cervical three-four;  Surgeon: Hewitt Shorts, MD;  Location: [facility];  Service: Neurosurgery;   Laterality: N/A;  C3-4 anterior cervical decompression with fusion plating and bonegraft   Family History  Problem Relation Age of Onset  . Cancer Mother   . Heart attack Father   . Dementia Sister    Social History  Substance Use Topics  . Smoking status: Former Smoker -- 1.00 packs/day for 10 years    Types: Cigarettes  . Smokeless tobacco: None  . Alcohol Use: No     Comment: very rare    Review of Systems  All other systems reviewed and are negative.     Allergies  Fluvirin  Home Medications   Prior to Admission medications   Medication Sig Start Date End Date Taking? Authorizing Provider  atenolol (TENORMIN) 25 MG tablet Take 12.5 mg by mouth daily.   Yes Historical Provider, MD  cholecalciferol (VITAMIN D) 1000 UNITS tablet Take 1,000 Units by mouth daily.    Yes Historical Provider, MD  ferrous sulfate 325 (65 FE) MG tablet Take 325 mg by mouth daily with breakfast.   Yes Historical Provider, MD  FLUoxetine (PROZAC) 20 MG capsule Take 20 mg by mouth daily.   Yes Historical Provider, MD  HUMALOG 100 UNIT/ML injection Inject 3-4 Units into the skin 4 (four) times daily -  with meals and at bedtime. Gets 3 units before each meal except 4 units at supper and also on sliding scale  03/26/15  Yes Historical Provider, MD  insulin glargine (LANTUS) 100 UNIT/ML injection Inject 33 Units into the skin daily.    Yes Historical Provider, MD   BP 120/47 mmHg  Pulse 50  Temp(Src) 98.6 F (37 C) (Rectal)  Resp 18  SpO2 97% Physical Exam  Constitutional: He is oriented to person, place, and time. He appears well-developed.  Elderly, frail  HENT:  Head: Normocephalic and atraumatic.  Right Ear: External ear normal.  Left Ear: External ear normal.  Dry mucous membranes. No oral lesions  Eyes: Conjunctivae and EOM are normal. Pupils are equal, round, and reactive to light.  Neck: Normal range of motion and phonation normal. Neck supple.  Cardiovascular: Normal rate, regular  rhythm and normal heart sounds.   Pulmonary/Chest: Effort normal and breath sounds normal. No respiratory distress. He exhibits no bony tenderness.  Abdominal: Soft. He exhibits no distension. There is no tenderness.  Musculoskeletal: Normal range of motion.  Neurological: He is alert and oriented to person, place, and time. No cranial nerve deficit or sensory deficit. He exhibits normal muscle tone. Coordination normal.  Skin: Skin is warm, dry and intact.  Stage I-2 sacral pressure sore, without associated induration or drainage.  Psychiatric: He has a normal mood and affect. His behavior is normal. Judgment and thought content normal.  Nursing note and vitals reviewed.   ED Course  Procedures (including critical care time)  Medications  sodium chloride 0.9 % bolus 500 mL (not administered)  0.9 %  sodium chloride infusion (not administered)  cefTRIAXone (ROCEPHIN) 1 g in dextrose 5 % 50 mL IVPB (not administered)    Patient Vitals for the past 24 hrs:  BP Temp Temp src Pulse Resp SpO2  09/15/15 1316 - 98.6 F (37 C) Rectal - - -  09/15/15 1210 (!) 120/47 mmHg 98 F (36.7 C) Oral (!) 50 18 97 %    2:44 PM Reevaluation with update and discussion. After initial assessment and treatment, an updated evaluation reveals patient is somewhat more alert and bright. Heart rate currently 44 palpated. Right wrist radial artery by me. Findings discussed with family members, son and daughter. All questions answered. [name]    2:48 PM-Consult complete with Hospitalist. Patient case explained and discussed. She agrees to admit patient for further evaluation and treatment. Call ended at 15:00  Labs Review Labs Reviewed  URINALYSIS, ROUTINE W REFLEX MICROSCOPIC (NOT AT [facility]) - Abnormal; Notable for the following:    Hgb urine dipstick TRACE (*)    All other components within normal limits  BASIC METABOLIC PANEL - Abnormal; Notable for the following:    BUN 35 (*)    Creatinine, Ser  1.30 (*)    GFR calc non Af Amer 49 (*)    GFR calc Af Amer 57 (*)    All other components within normal limits  CBC WITH DIFFERENTIAL/PLATELET - Abnormal; Notable for the following:    RBC 3.68 (*)    Hemoglobin 12.0 (*)    HCT 37.1 (*)    MCV 100.8 (*)    Monocytes Absolute 1.1 (*)    All other components within normal limits  URINE CULTURE  URINE MICROSCOPIC-ADD ON    BUN  Date Value Ref Range Status  09/15/2015 35* 6 - 20 mg/dL Final  16/09/9603 22 6 - 23 mg/dL Final  54/08/8118 35* 6 - 23 mg/dL Final  14/78/2956 34* 6 - 23 mg/dL Final   CREATININE  Date Value Ref Range Status  12/18/2014 1.3 .  6 - 1.3 mg/dL Final   CREATININE, SER  Date Value Ref Range Status  09/15/2015 1.30* 0.61 - 1.24 mg/dL Final  25/36/6440 3.47 0.40 - 1.50 mg/dL Final  42/59/5638 7.56* 0.50 - 1.35 mg/dL Final  43/32/9518 8.41 0.50 - 1.35 mg/dL Final      Imaging Review No results found. I have personally reviewed and evaluated these images and lab results as part of my medical decision-making.   EKG Interpretation None      MDM   Final diagnoses:  Lethargy  Dehydration    Evaluation consistent with lethergy and altered mental status related to urinary tract infection. Doubt severe sepsis, metabolic instability, or impending vascular collapse. Suspect mild dehydration. He lives in an assisted living facility, and is not currently able to manage his ADLs. He will require at least overnight observation for IV fluids, and reassessment.   Nursing Notes Reviewed/ Care Coordinated, and agree without changes. Applicable Imaging Reviewed.  Interpretation of Laboratory Data incorporated into ED treatment   Plan: Admit  Mancel Bale, MD 09/15/15 1541

## 2015-09-15 NOTE — ED Notes (Signed)
Pt sats dropped to 67% prior to transport to [facility].  Dr. Izola Price notified.  Patient will go to [facility].  She will change orders

## 2015-09-15 NOTE — ED Notes (Signed)
Report called to [place], RN [address]

## 2015-09-16 ENCOUNTER — Inpatient Hospital Stay (HOSPITAL_COMMUNITY): Payer: Medicare Other

## 2015-09-16 DIAGNOSIS — R0600 Dyspnea, unspecified: Secondary | ICD-10-CM

## 2015-09-16 DIAGNOSIS — Z794 Long term (current) use of insulin: Secondary | ICD-10-CM

## 2015-09-16 DIAGNOSIS — J9601 Acute respiratory failure with hypoxia: Secondary | ICD-10-CM

## 2015-09-16 DIAGNOSIS — N178 Other acute kidney failure: Secondary | ICD-10-CM

## 2015-09-16 DIAGNOSIS — E0840 Diabetes mellitus due to underlying condition with diabetic neuropathy, unspecified: Secondary | ICD-10-CM

## 2015-09-16 DIAGNOSIS — R001 Bradycardia, unspecified: Secondary | ICD-10-CM

## 2015-09-16 DIAGNOSIS — G9340 Encephalopathy, unspecified: Principal | ICD-10-CM

## 2015-09-16 LAB — URINE CULTURE
CULTURE: NO GROWTH
Special Requests: NORMAL

## 2015-09-16 LAB — BASIC METABOLIC PANEL
ANION GAP: 5 (ref 5–15)
BUN: 24 mg/dL — ABNORMAL HIGH (ref 6–20)
CHLORIDE: 108 mmol/L (ref 101–111)
CO2: 29 mmol/L (ref 22–32)
Calcium: 9.2 mg/dL (ref 8.9–10.3)
Creatinine, Ser: 1.14 mg/dL (ref 0.61–1.24)
GFR calc non Af Amer: 58 mL/min — ABNORMAL LOW (ref >60)
Glucose, Bld: 78 mg/dL (ref 65–99)
POTASSIUM: 3.4 mmol/L — AB (ref 3.5–5.1)
SODIUM: 142 mmol/L (ref 135–145)

## 2015-09-16 LAB — GLUCOSE, CAPILLARY
GLUCOSE-CAPILLARY: 51 mg/dL — AB (ref 65–99)
GLUCOSE-CAPILLARY: 58 mg/dL — AB (ref 65–99)
GLUCOSE-CAPILLARY: 301 mg/dL — AB (ref 65–99)
Glucose-Capillary: 147 mg/dL — ABNORMAL HIGH (ref 65–99)
Glucose-Capillary: 138 mg/dL — ABNORMAL HIGH (ref 65–99)
Glucose-Capillary: 153 mg/dL — ABNORMAL HIGH (ref 65–99)
Glucose-Capillary: 166 mg/dL — ABNORMAL HIGH (ref 65–99)

## 2015-09-16 LAB — CBC
HEMATOCRIT: 39.3 % (ref 39.0–52.0)
Hemoglobin: 12.7 g/dL — ABNORMAL LOW (ref 13.0–17.0)
MCH: 32.5 pg (ref 26.0–34.0)
MCHC: 32.3 g/dL (ref 30.0–36.0)
MCV: 100.5 fL — AB (ref 78.0–100.0)
Platelets: 261 10*3/uL (ref 150–400)
RBC: 3.91 MIL/uL — AB (ref 4.22–5.81)
RDW: 12.2 % (ref 11.5–15.5)
WBC: 8.7 10*3/uL (ref 4.0–10.5)

## 2015-09-16 LAB — STREP PNEUMONIAE URINARY ANTIGEN: Strep Pneumo Urinary Antigen: NEGATIVE

## 2015-09-16 LAB — BRAIN NATRIURETIC PEPTIDE: B Natriuretic Peptide: 278.9 pg/mL — ABNORMAL HIGH (ref 0.0–100.0)

## 2015-09-16 LAB — HIV ANTIBODY (ROUTINE TESTING W REFLEX): HIV Screen 4th Generation wRfx: NONREACTIVE

## 2015-09-16 MED ORDER — AMLODIPINE BESYLATE 5 MG PO TABS
5.0000 mg | ORAL_TABLET | Freq: Every day | ORAL | Status: DC
  Administered 2015-09-16 – 2015-09-17 (×2): 5 mg via ORAL
  Filled 2015-09-16 (×2): qty 1

## 2015-09-16 MED ORDER — DEXTROSE 50 % IV SOLN
INTRAVENOUS | Status: AC
  Administered 2015-09-16: 50 mL
  Filled 2015-09-16: qty 50

## 2015-09-16 MED ORDER — LISINOPRIL 10 MG PO TABS
10.0000 mg | ORAL_TABLET | Freq: Every day | ORAL | Status: DC
  Administered 2015-09-16 – 2015-09-17 (×2): 10 mg via ORAL
  Filled 2015-09-16 (×2): qty 1

## 2015-09-16 MED ORDER — FUROSEMIDE 20 MG PO TABS
20.0000 mg | ORAL_TABLET | Freq: Every day | ORAL | Status: DC
  Administered 2015-09-16 – 2015-09-17 (×2): 20 mg via ORAL
  Filled 2015-09-16 (×2): qty 1

## 2015-09-16 MED ORDER — POTASSIUM CHLORIDE CRYS ER 20 MEQ PO TBCR
40.0000 meq | EXTENDED_RELEASE_TABLET | Freq: Once | ORAL | Status: AC
  Administered 2015-09-16: 40 meq via ORAL
  Filled 2015-09-16: qty 2

## 2015-09-16 NOTE — Progress Notes (Signed)
[facility] PROGRESS NOTE  Javier Murillo ZOX:[id number] DOB: March 13, 1930 DOA: 09/15/2015 PCP: Gaye Alken, MD  Assessment/Plan: 1. Acute encephalopathy- resolved, patient at baseline. Likely from transient hypoxia which patient has been having at the assisted living facility.  2. Acute respiratory with hypoxia- patient's echocardiogram shows grade 1 diastolic dysfunction, will obtain BNP. Patient is not short of breath at this time. Start low-dose Lasix 20 mg by mouth daily. 3. Acute kidney injury- mild renal failure, improved with IV fluids. Will discontinue IV fluids as patient has history of diastolic dysfunction. 4. ? Community-acquired pneumonia- patient has been started on ceftriaxone and Zithromax, chest x-ray does not show pneumonia. Urinary strep pneumo antigen is negative, Legionella antigen is pending. 5. Bradycardia- patient was on atenolol which is currently on hold. We'll monitor the patient on telemetry 6. Hypertension- start the patient on amlodipine 5 mg by mouth daily, lisinopril 10 mg by mouth daily. 7. Diabetes mellitus- Lantus currently on hold, sliding scale insulin with NovoLog 8. Hypokalemia- replace potassium and check BMP in a.m. 9. DVT prophylaxis- Lovenox  Code Status: Full code Family Communication: Discussed with patient's daughter and son-in-law at bedside Disposition Plan: Assisted living when medically stable   Consultants:  None  Procedures:  None  Antibiotics:  Ceftriaxone  Zithromax  HPI/Subjective: 81 yo male with known HTN, decubitus ulcer, resident of ALF, presented to [facility] ED for evaluation of progressive confusion. Pt is unable to provide any history at the time of the admission and family at bedside able to provide some information. They explained that pt was ut having lunch with family 3 days PTA and appeared to be confused at times but has not voiced any concerns. At the facility he was noted be more confused and  lethargic this AM. No reported chest pain or shortness of breath, no reported abd or urinary concerns, no reported fevers, chills.   This morning patient feels better. Denies shortness of breath or chest pain.  Objective: Filed Vitals:   09/16/15 1600  BP: 129/79  Pulse: 53  Temp: 97.5 F (36.4 C)  Resp: 18    Intake/Output Summary (Last 24 hours) at 09/16/15 1641 Last data filed at 09/16/15 1622  Gross per 24 hour  Intake 2806.09 ml  Output   1760 ml  Net 1046.09 ml   Filed Weights   09/15/15 1846  Weight: 79.5 kg (175 lb 4.3 oz)    Exam:   General:  Appears in no acute distress  Cardiovascular: S1-S2 is regular  Respiratory: Clear to auscultation bilaterally  Abdomen: Soft, nontender, no organomegaly  Musculoskeletal: No edema of the lower extremities   Data Reviewed: Basic Metabolic Panel:  Recent Labs Lab 09/15/15 1233 09/16/15 0345  NA 141 142  K 3.9 3.4*  CL 107 108  CO2 27 29  GLUCOSE 82 78  BUN 35* 24*  CREATININE 1.30* 1.14  CALCIUM 9.3 9.2   Liver Function Tests: No results for input(s): AST, ALT, ALKPHOS, BILITOT, PROT, ALBUMIN in the last 168 hours. No results for input(s): LIPASE, AMYLASE in the last 168 hours. No results for input(s): AMMONIA in the last 168 hours. CBC:  Recent Labs Lab 09/15/15 1233 09/16/15 0345  WBC 10.5 8.7  NEUTROABS 7.5  --   HGB 12.0* 12.7*  HCT 37.1* 39.3  MCV 100.8* 100.5*  PLT 246 261   Cardiac Enzymes: No results for input(s): CKTOTAL, CKMB, CKMBINDEX, TROPONINI in the last 168 hours. BNP (last 3 results) No results for input(s): BNP in the  last 8760 hours.  ProBNP (last 3 results) No results for input(s): PROBNP in the last 8760 hours.  CBG:  Recent Labs Lab 09/16/15 0525 09/16/15 0548 09/16/15 0724 09/16/15 1151 09/16/15 1604  GLUCAP 51* 147* 138* 153* 166*    Recent Results (from the past 240 hour(s))  Urine culture     Status: None   Collection Time: 09/15/15 12:49 PM  Result  Value Ref Range Status   Specimen Description URINE, CLEAN CATCH  Final   Special Requests Normal  Final   Culture   Final    NO GROWTH 1 DAY Performed at [facility]    Report Status 09/16/2015 FINAL  Final  Culture, blood (routine x 2) Call MD if unable to obtain prior to antibiotics being given     Status: None (Preliminary result)   Collection Time: 09/15/15  5:21 PM  Result Value Ref Range Status   Specimen Description BLOOD LEFT HAND  Final   Special Requests BOTTLES DRAWN AEROBIC ONLY 5CC  Final   Culture   Final    NO GROWTH < 24 HOURS Performed at [facility]    Report Status PENDING  Incomplete  Culture, blood (routine x 2) Call MD if unable to obtain prior to antibiotics being given     Status: None (Preliminary result)   Collection Time: 09/15/15  5:29 PM  Result Value Ref Range Status   Specimen Description BLOOD RIGHT ANTECUBITAL  Final   Special Requests BOTTLES DRAWN AEROBIC AND ANAEROBIC 5 CC EACH  Final   Culture   Final    NO GROWTH < 24 HOURS Performed at [facility]    Report Status PENDING  Incomplete  MRSA PCR Screening     Status: None   Collection Time: 09/15/15  6:30 PM  Result Value Ref Range Status   MRSA by PCR NEGATIVE NEGATIVE Final    Comment:        The GeneXpert MRSA Assay (FDA approved for NASAL specimens only), is one component of a comprehensive MRSA colonization surveillance program. It is not intended to diagnose MRSA infection nor to guide or monitor treatment for MRSA infections.      Studies: Dg Chest 2 View  09/15/2015   CLINICAL DATA:  Patient with weakness, lethargy. Confusion. Dementia.  EXAM: CHEST  2 VIEW  COMPARISON:  None.  FINDINGS: Low lung volumes. Normal cardiac mediastinal contours. Bilateral lower lung heterogeneous opacities. Anterior cervical spinal fusion hardware. Mid thoracic spine degenerative changes.  IMPRESSION: Low lung volumes limit examination. Basilar heterogeneous opacities may  represent atelectasis or infection. Component of interstitial process, particularly given the appearance on lateral view is not excluded.   Electronically Signed   By: Annia Belt M.D.   On: 09/15/2015 15:34    Scheduled Meds: . amLODipine  5 mg Oral Daily  . azithromycin  500 mg Intravenous Q24H  . cefTRIAXone (ROCEPHIN)  IV  1 g Intravenous Q24H  . enoxaparin (LOVENOX) injection  40 mg Subcutaneous Q24H  . furosemide  20 mg Oral Daily  . insulin aspart  0-9 Units Subcutaneous TID WC  . lisinopril  10 mg Oral Daily   Continuous Infusions: . sodium chloride 10 mL/hr at 09/16/15 1241    Principal Problem:   Acute encephalopathy Active Problems:   Diabetic neuropathy (HCC)   Acute renal failure (HCC)   Acute respiratory failure with hypoxia (HCC)   Bradycardia   Diabetes mellitus with neuropathy (HCC)    Time spent: 25  min    [facility]  [facility] Pager [phone]. If 7PM-7AM, please contact night-coverage at [email or web address], password [email or web address] 09/16/2015, 4:41 PM  LOS: 1 day

## 2015-09-16 NOTE — Care Management Note (Signed)
Case Management Note  Patient Details  Name: Javier Murillo MRN: [id number] Date of Birth: 1929/12/16  Subjective/Objective:         ams versus infectious process versus cva /tia           Action/Plan:Date:  Oct. 10, 2016 U.R. performed for needs and level of care. Will continue to follow for Case Management needs.  Marcelle Smiling, RN, BSN, [place]   [phone]    Expected Discharge Date:                  Expected Discharge Plan:  Skilled Nursing Facility  In-House Referral:  Clinical Social Work  Discharge planning Services  CM Consult  Post Acute Care Choice:  NA Choice offered to:  NA  DME Arranged:    DME Agency:     HH Arranged:    HH Agency:     Status of Service:  In process, will continue to follow  Medicare Important Message Given:    Date Medicare IM Given:    Medicare IM give by:    Date Additional Medicare IM Given:    Additional Medicare Important Message give by:     If discussed at Long Length of Stay Meetings, dates discussed:    Additional Comments:  Golda Acre, RN 09/16/2015, 10:20 AM

## 2015-09-16 NOTE — Progress Notes (Signed)
Echocardiogram 2D Echocardiogram has been performed.  Javier Murillo 09/16/2015, 11:05 AM

## 2015-09-16 NOTE — Progress Notes (Signed)
Patients blood sugar reported to RN by NT to be 58. Patient was given 2 cups of orange juice. Will recheck blood sugar in 15 minutes per protocol. Bosie Helper, RN

## 2015-09-16 NOTE — Consult Note (Signed)
WOC wound consult note Reason for Consult:Moisture Associated Skin Damage (MASD) Incontinence Associated to sacrum and buttocks. Present on admission.  Wound type: Incontinence Associated Skin Damage, partial thickness Pressure Ulcer POA: Yes Measurement: 3 separate lesions  1 cm x 0.8 cm x 0.1 cm to sacrum 0.5 cm x 0.5 cm x 0.1 cm to left buttocks 0.5 cm in diameter distal to left buttock lesion Wound bed:100% pink and moist Meatus is red and moist upon assessment today.  Drainage (amount, consistency, odor) Scant serous drainage.  Periwound:Blanchable erythema Dressing procedure/placement/frequency:Cleanse sacrum and buttocks with soap and water.  Apply sacral foam dressing.  Change every 3 days and PRN soilage. Apply moisture barrier cream to perineal areas to protect from incontinence.  Keep clean and dry.  Will not follow at this time.  Please re-consult if needed.  Maple Hudson RN BSN [facility] Pager [phone]

## 2015-09-17 ENCOUNTER — Telehealth: Payer: Self-pay | Admitting: Endocrinology

## 2015-09-17 LAB — GLUCOSE, CAPILLARY: GLUCOSE-CAPILLARY: 146 mg/dL — AB (ref 65–99)

## 2015-09-17 LAB — PROCALCITONIN

## 2015-09-17 LAB — LEGIONELLA PNEUMOPHILA SEROGP 1 UR AG: L. PNEUMOPHILA SEROGP 1 UR AG: NEGATIVE

## 2015-09-17 LAB — BASIC METABOLIC PANEL
ANION GAP: 6 (ref 5–15)
BUN: 26 mg/dL — ABNORMAL HIGH (ref 6–20)
CO2: 28 mmol/L (ref 22–32)
Calcium: 9 mg/dL (ref 8.9–10.3)
Chloride: 104 mmol/L (ref 101–111)
Creatinine, Ser: 1.32 mg/dL — ABNORMAL HIGH (ref 0.61–1.24)
GFR calc Af Amer: 56 mL/min — ABNORMAL LOW (ref >60)
GFR, EST NON AFRICAN AMERICAN: 48 mL/min — AB (ref >60)
Glucose, Bld: 185 mg/dL — ABNORMAL HIGH (ref 65–99)
POTASSIUM: 4.3 mmol/L (ref 3.5–5.1)
SODIUM: 138 mmol/L (ref 135–145)

## 2015-09-17 MED ORDER — FUROSEMIDE 20 MG PO TABS: 20.0000 mg | ORAL_TABLET | Freq: Every day | ORAL | Status: DC

## 2015-09-17 MED ORDER — LISINOPRIL 10 MG PO TABS: 10.0000 mg | ORAL_TABLET | Freq: Every day | ORAL | Status: DC

## 2015-09-17 MED ORDER — LEVOFLOXACIN 750 MG PO TABS: 750.0000 mg | ORAL_TABLET | ORAL | Status: AC

## 2015-09-17 MED ORDER — AMLODIPINE BESYLATE 5 MG PO TABS: 5.0000 mg | ORAL_TABLET | Freq: Every day | ORAL | Status: DC

## 2015-09-17 MED ORDER — LEVOFLOXACIN 750 MG PO TABS: 750.0000 mg | ORAL_TABLET | ORAL | Status: DC

## 2015-09-17 MED ORDER — AMLODIPINE BESYLATE 5 MG PO TABS: 5.0000 mg | ORAL_TABLET | Freq: Every day | ORAL | Status: AC

## 2015-09-17 NOTE — Telephone Encounter (Addendum)
Could you review for Dr. Lucianne Muss?

## 2015-09-17 NOTE — Discharge Summary (Signed)
Physician Discharge Summary  Javier Murillo ZOX:[id number] DOB: 18-Jul-1930 DOA: 09/15/2015  PCP: Gaye Alken, MD  Admit date: 09/15/2015 Discharge date: 09/17/2015  Time spent: 25* minutes  Recommendations for Outpatient Follow-up:  1. Follow up PCP in one week to check BMP 2. Continue Levaquin 750 mg po Q 48 hrs for five more days, stop on 09/21/15  Discharge Diagnoses:  Principal Problem:   Acute encephalopathy Active Problems:   Diabetic neuropathy (HCC)   Acute renal failure (HCC)   Acute respiratory failure with hypoxia (HCC)   Bradycardia   Diabetes mellitus with neuropathy [facility])   Discharge Condition: Stable  Diet recommendation: Low salt diet  Filed Weights   09/15/15 1846  Weight: 79.5 kg (175 lb 4.3 oz)    History of present illness:  81 yo male with known HTN, decubitus ulcer, resident of ALF, presented to [facility] ED for evaluation of progressive confusion. Pt is unable to provide any history at the time of the admission and family at bedside able to provide some information. They explained that pt was ut having lunch with family 3 days PTA and appeared to be confused at times but has not voiced any concerns. At the facility he was noted be more confused and lethargic this AM. No reported chest pain or shortness of breath, no reported abd or urinary concerns, no reported fevers, chills  Hospital Course:  1. Acute encephalopathy- resolved, patient at baseline. Likely from transient hypoxia which patient has been having at the assisted living facility. Oxygen saturation 97% on RA. 2. Acute respiratory with hypoxia- patient's echocardiogram shows grade 1 diastolic dysfunction,BNP is 278.9, was started on low dose lasix 20 mg po daily. Breathin is stable, will discharge on po lasix 20 mg daily. 3. CKD stage 3- creatinine is stable. Follow BMP in one week 4. ? Community-acquired pneumonia- patient has been started on ceftriaxone and Zithromax, chest x-ray does not  show pneumonia. Urinary strep pneumo antigen is negative, Legionella antigen is pending.Blood cultures are negative so far.Will disharge on po Levaquin 750 mg po Q 48 hrs for five more days. 5. Bradycardia- resolved, atenolol has been discontinued. 6. Hypertension- startedhe patient on amlodipine 5 mg by mouth daily, lisinopril 10 mg by mouth daily. BP has improved, will discharge on these medications. 7. Diabetes mellitus- Continue Lantus and sliding scale insulin with Novolog. 8. Hypokalemia- replaced potassium, this morning potassium is 4.3  Procedures:  None  Consultations:  None  Discharge Exam: Filed Vitals:   09/17/15 0800  BP: 136/58  Pulse: 55  Temp:   Resp: 13    General: Appears in no acute distress Cardiovascular: S1S2 RRR Respiratory: Clear bilaterally  Discharge Instructions   Discharge Instructions    Diet - low sodium heart healthy    Complete by:  As directed      Increase activity slowly    Complete by:  As directed           Current Discharge Medication List    START taking these medications   Details  amLODipine (NORVASC) 5 MG tablet Take 1 tablet (5 mg total) by mouth daily.    furosemide (LASIX) 20 MG tablet Take 1 tablet (20 mg total) by mouth daily. Qty: 30 tablet    levofloxacin (LEVAQUIN) 750 MG tablet Take 1 tablet (750 mg total) by mouth every other day. Qty: 3 tablet, Refills: 0    lisinopril (PRINIVIL,ZESTRIL) 10 MG tablet Take 1 tablet (10 mg total) by mouth daily.  CONTINUE these medications which have NOT CHANGED   Details  cholecalciferol (VITAMIN D) 1000 UNITS tablet Take 1,000 Units by mouth daily.     ferrous sulfate 325 (65 FE) MG tablet Take 325 mg by mouth daily with breakfast.    FLUoxetine (PROZAC) 20 MG capsule Take 20 mg by mouth daily.    HUMALOG 100 UNIT/ML injection Inject 3-4 Units into the skin 4 (four) times daily -  with meals and at bedtime. Gets 3 units before each meal except 4 units at supper and  also on sliding scale    insulin glargine (LANTUS) 100 UNIT/ML injection Inject 33 Units into the skin daily.       STOP taking these medications     atenolol (TENORMIN) 25 MG tablet        Allergies  Allergen Reactions  . Fluvirin [Influenza Virus Vaccine Split] Other (See Comments)    Gave patient flu      The results of significant diagnostics from this hospitalization (including imaging, microbiology, ancillary and laboratory) are listed below for reference.    Significant Diagnostic Studies: Dg Chest 2 View  09/15/2015   CLINICAL DATA:  Patient with weakness, lethargy. Confusion. Dementia.  EXAM: CHEST  2 VIEW  COMPARISON:  None.  FINDINGS: Low lung volumes. Normal cardiac mediastinal contours. Bilateral lower lung heterogeneous opacities. Anterior cervical spinal fusion hardware. Mid thoracic spine degenerative changes.  IMPRESSION: Low lung volumes limit examination. Basilar heterogeneous opacities may represent atelectasis or infection. Component of interstitial process, particularly given the appearance on lateral view is not excluded.   Electronically Signed   By: Annia Belt M.D.   On: 09/15/2015 15:34    Microbiology: Recent Results (from the past 240 hour(s))  Urine culture     Status: None   Collection Time: 09/15/15 12:49 PM  Result Value Ref Range Status   Specimen Description URINE, CLEAN CATCH  Final   Special Requests Normal  Final   Culture   Final    NO GROWTH 1 DAY Performed at [facility]    Report Status 09/16/2015 FINAL  Final  Culture, blood (routine x 2) Call MD if unable to obtain prior to antibiotics being given     Status: None (Preliminary result)   Collection Time: 09/15/15  5:21 PM  Result Value Ref Range Status   Specimen Description BLOOD LEFT HAND  Final   Special Requests BOTTLES DRAWN AEROBIC ONLY 5CC  Final   Culture   Final    NO GROWTH < 24 HOURS Performed at [facility]    Report Status PENDING  Incomplete   Culture, blood (routine x 2) Call MD if unable to obtain prior to antibiotics being given     Status: None (Preliminary result)   Collection Time: 09/15/15  5:29 PM  Result Value Ref Range Status   Specimen Description BLOOD RIGHT ANTECUBITAL  Final   Special Requests BOTTLES DRAWN AEROBIC AND ANAEROBIC 5 CC EACH  Final   Culture   Final    NO GROWTH < 24 HOURS Performed at [facility]    Report Status PENDING  Incomplete  MRSA PCR Screening     Status: None   Collection Time: 09/15/15  6:30 PM  Result Value Ref Range Status   MRSA by PCR NEGATIVE NEGATIVE Final    Comment:        The GeneXpert MRSA Assay (FDA approved for NASAL specimens only), is one component of a comprehensive MRSA colonization surveillance program.  It is not intended to diagnose MRSA infection nor to guide or monitor treatment for MRSA infections.      Labs: Basic Metabolic Panel:  Recent Labs Lab 09/15/15 1233 09/16/15 0345 09/17/15 0517  NA 141 142 138  K 3.9 3.4* 4.3  CL 107 108 104  CO2 GLUCOSE 82 78 185*  BUN 35* 24* 26*  CREATININE 1.30* 1.14 1.32*  CALCIUM 9.3 9.2 9.0   Liver Function Tests: No results for input(s): AST, ALT, ALKPHOS, BILITOT, PROT, ALBUMIN in the last 168 hours. No results for input(s): LIPASE, AMYLASE in the last 168 hours. No results for input(s): AMMONIA in the last 168 hours. CBC:  Recent Labs Lab 09/15/15 1233 09/16/15 0345  WBC 10.5 8.7  NEUTROABS 7.5  --   HGB 12.0* 12.7*  HCT 37.1* 39.3  MCV 100.8* 100.5*  PLT 246 261   Cardiac Enzymes: No results for input(s): CKTOTAL, CKMB, CKMBINDEX, TROPONINI in the last 168 hours. BNP: BNP (last 3 results)  Recent Labs  09/16/15 1755  BNP 278.9*    ProBNP (last 3 results) No results for input(s): PROBNP in the last 8760 hours.  CBG:  Recent Labs Lab 09/16/15 0724 09/16/15 1151 09/16/15 1604 09/16/15 2158 09/17/15 0749  GLUCAP 138* 153* 166* 301* 146*        Signed:  LAMA,GAGAN S  [facility] 09/17/2015, 9:51 AM

## 2015-09-17 NOTE — Progress Notes (Signed)
CSW received call from [facility] that patient was discharged back to assisted living wihtout fl2, discharge summary, or medication review. CSW to follow up with unit, and patient attending.    Olga Coaster, LCSW  Clinical Social Work  [employer] [phone]

## 2015-09-17 NOTE — Telephone Encounter (Signed)
Tonight, please check cbg every 2 hrs, and apply SS q 2 hrs. Go to ER for n/v/sob Please call Dr Lucianne Muss tomorrow to report progress

## 2015-09-17 NOTE — Progress Notes (Signed)
Discharge notes gone over with patient and copy sent home with patient.  Four perscriptions given to son to take back with patient to [facility].  Haynes Giannotti Debroah Loop RN

## 2015-09-17 NOTE — Telephone Encounter (Signed)
Dee calling [address] of [place] stated patient b/s was 415 at 4:00, 445 at 4:30 with his sliding scale, and still going up, please advise today

## 2015-09-17 NOTE — Telephone Encounter (Signed)
Javier Murillo see notes below. I contacted [facility] and left a voicemail advising of note below. I was unable to verbally speak with her.

## 2015-09-17 NOTE — Telephone Encounter (Signed)
Needs to be seen for follow-up in another 2-3 weeks, if blood sugars are consistently high need to be seen this week

## 2015-09-18 NOTE — Telephone Encounter (Signed)
Please read messages below

## 2015-09-19 ENCOUNTER — Ambulatory Visit (INDEPENDENT_AMBULATORY_CARE_PROVIDER_SITE_OTHER): Payer: Medicare Other | Admitting: Endocrinology

## 2015-09-19 ENCOUNTER — Encounter: Payer: Self-pay | Admitting: Endocrinology

## 2015-09-19 VITALS — BP 104/52 | HR 70 | Temp 98.2°F | Resp 16 | Ht 72.0 in | Wt 173.0 lb

## 2015-09-19 DIAGNOSIS — E1165 Type 2 diabetes mellitus with hyperglycemia: Secondary | ICD-10-CM

## 2015-09-19 DIAGNOSIS — Z794 Long term (current) use of insulin: Secondary | ICD-10-CM

## 2015-09-19 NOTE — Progress Notes (Signed)
Patient ID: Javier Murillo, male   DOB: Feb 25, 1930, 81 y.o.   MRN: [id number]           Reason for Appointment: Follow-up for Type 2 Diabetes  Referring physician: Zachery Dauer  History of Present Illness:          Diagnosis: Type 2 diabetes mellitus, date of diagnosis:1975        Past history: He may have been treated with oral agents for the first few years of his diabetes management He thinks he has been on insulin for several years, uncertain how long. For some reason he has been continued on glyburide also which she probably has been taking for a long time Not clear if he had taken metformin or why it was stopped He probably has been on Lantus for quite some time He has had fair control of his diabetes over the last 2-3 years with A1c ranging from 7.4-9.3, relatively higher since 10/2013  On his initial consultation he had been on a fixed dose of Lantus along with sliding scale Humalog based on blood sugar levels before meals Because of persistently high A1c of 9% or more since 06/2014 he seen in consultation and he was started on pre-meal insulin along with correction doses His glyburide stopped because of his age and needing basal bolus insulin   Recent history:  INSULIN regimen is described as:   Lantus 33 units in a.m., Humalog 3 units at breakfast and 3 units at lunch and 4 supper Correction doses for high readings: If blood sugar below 200: No insulin  Blood sugar 200-249 = 1 unit 250 and above = 2 units  His blood sugars are  recently higher since he was in the hospital on 10/9. His Lantus was held in the hospital but only restarted when he was back in nursing home  Glucose readings obtained from nursing home for the last 2-3 days are as follows 11th: 4 pm = 428 12th: 7 am 183, 11 am 337, 4 pm 458.   13th: 7 am 300, 11 am 236  Previous detailed records are not available from nursing home, no records were sent again today. He thinks he has been getting his insulin as  directed since he went back to the nursing home Occasionally he will go out to eat with his son but is otherwise getting the diet from the nursing home    His insulin doses have been periodically adjusted based on his blood sugars sent by the fax from the nursing home.  Today his insulin doses could not be confirmed as no paperwork was sent from the  Nursing home.  No hypoglycemia  Diet: He is generally ordering whatever he wants on his menu at the nursing home and he usually finishes his meal.  He thinks he does not order any dessert  He is eating eggs, meat and toast at breakfast Hypoglycemia: None         Oral hypoglycemic drugs the patient is taking are:       Side effects from medications have been: None  Glucose monitoring:  done 2-3 times a day Glucometer: Unknown       Blood Glucose readings by time of day and averages from recent fax As above   Self-care: The diet that the patient has been following is: None, patient is generally eating liberal diet which he orders at his nursing home    Meals: 3 meals per day. Breakfast is eggs, toast, sausage.  Lunch and dinner  usually a meat or fish along with vegetables, bread and sugar-free ice cream.  Usually has sugar-free snacks           Physical activity:  none, in wheelchair         Dietician visit, most recent: Never .               Weight history:  Wt Readings from Last 3 Encounters:  09/19/15 173 lb (78.472 kg)  09/15/15 175 lb 4.3 oz (79.5 kg)  07/08/15 170 lb 12.8 oz (77.474 kg)    Glycemic control:   Lab Results  Component Value Date   HGBA1C 8.5 07/08/2015   HGBA1C 9.5* 04/08/2015   HGBA1C 9.3* 12/18/2014   Lab Results  Component Value Date   LDLCALC 99 04/08/2015   CREATININE 1.32* 09/17/2015    Urine microalbumin/creatinine ratio 50.4 done on 10/27/13     Medication List       This list is accurate as of: 09/19/15 11:59 PM.  Always use your most recent med list.               amLODipine 5 MG  tablet  Commonly known as:  NORVASC  Take 1 tablet (5 mg total) by mouth daily.     cholecalciferol 1000 UNITS tablet  Commonly known as:  VITAMIN D  Take 1,000 Units by mouth daily.     ferrous sulfate 325 (65 FE) MG tablet  Take 325 mg by mouth daily with breakfast.     FLUoxetine 20 MG capsule  Commonly known as:  PROZAC  Take 20 mg by mouth daily.     furosemide 20 MG tablet  Commonly known as:  LASIX  Take 1 tablet (20 mg total) by mouth daily.     HUMALOG 100 UNIT/ML injection  Generic drug:  insulin lispro  Inject 3-4 Units into the skin 4 (four) times daily -  with meals and at bedtime. Gets 3 units before each meal except 4 units at supper and also on sliding scale     insulin glargine 100 UNIT/ML injection  Commonly known as:  LANTUS  Inject 33 Units into the skin daily.     levofloxacin 750 MG tablet  Commonly known as:  LEVAQUIN  Take 1 tablet (750 mg total) by mouth every other day.     lisinopril 10 MG tablet  Commonly known as:  PRINIVIL,ZESTRIL  Take 1 tablet (10 mg total) by mouth daily.        Allergies:  Allergies  Allergen Reactions  . Fluvirin [Influenza Virus Vaccine Split] Other (See Comments)    Gave patient flu    Past Medical History  Diagnosis Date  . Quadriplegia and quadriparesis (HCC) 08/20/2014  . Diabetic peripheral neuropathy (HCC)   . Gait disorder   . Peripheral vascular disease (HCC)   . Degenerative arthritis   . Lumbosacral spondylosis   . Depression   . Chronic low back pain   . Kidney stones     only once  . Hypertension   . Hyperlipidemia   . Arthritis   . Diabetes mellitus without complication [facility])     Past Surgical History  Procedure Laterality Date  . Shoulder surgery Bilateral   . Cataract extraction w/ intraocular lens  implant, bilateral    . Bursitis Bilateral     olecranon I&D  . Lumbar spine surgery      x7  . Back surgery      x 7  . Eye surgery  bilateral cataracts  . Bone spur removed   right sholder    . Appendectomy    . Anterior cervical decomp/discectomy fusion N/A 08/22/2014    Procedure: ANTERIOR CERVICAL DECOMPRESSION/DISCECTOMY FUSION, cervical three-four;  Surgeon: Hewitt Shortsobert W Nudelman, MD;  Location: [facility];  Service: Neurosurgery;  Laterality: N/A;  C3-4 anterior cervical decompression with fusion plating and bonegraft    Family History  Problem Relation Age of Onset  . Cancer Mother   . Heart attack Father   . Dementia Sister     Social History:  reports that he has quit smoking. His smoking use included Cigarettes. He has a 10 pack-year smoking history. He does not have any smokeless tobacco history on file. He reports that he does not drink alcohol or use illicit drugs.    Review of Systems     Recently admitted to the hospital for dehydration and weakness       Lipids: not on any medications for hyperlipidemia       Lab Results  Component Value Date   CHOL 169 04/08/2015   HDL 54.80 04/08/2015   LDLCALC 99 04/08/2015   TRIG 78.0 04/08/2015   CHOLHDL 3 04/08/2015               He does have weakness in his legs from central cord problems previously and not able to ambulate Also some numbness in fingers   Physical Examination:  BP 104/52 mmHg  Pulse 70  Temp(Src) 98.2 F (36.8 C)  Resp 16  Ht 6' (1.829 m)  Wt 173 lb (78.472 kg)  BMI 23.46 kg/m2  SpO2 96%  GENERAL: He is alert and communicative   ASSESSMENT:  Diabetes type 2, uncontrolled with recent hyperglycemia subsequent to acute illness and interruption of his Lantus regimen in hospital See history of present illness for detailed discussion of his current management, blood sugar patterns and problems identified He is on a basal bolus insulin regimen with good compliance with his insulin regimen  Today his blood sugar is still high in the afternoon of 289. He is asymptomatic from hyperglycemia Most likely the hyperglycemia still persisting and will need to increase his  insulin regimen for now   PLAN:   Increase all her insulin doses by 2 units for now and review blood sugars by fax from nursing home next week Need to check more readings after supper which have not been apparently checked recently at nursing home Written instructions given    Patient Instructions    INSULIN doses: Increase Lantus to 35 units every morning HUMALOG insulin: Increase dose to 5 UNITS before each meal   Check blood sugar after supper around 8 PM every other day        [facility] PLLCKUMAR,Lendon George 09/22/2015, 8:51 PM   Note: This office note was prepared with [employer] underwriterDragon voice recognition system technology. Any transcriptional errors that result from this process are unintentional.

## 2015-09-19 NOTE — Patient Instructions (Signed)
   INSULIN doses: Increase Lantus to 35 units every morning HUMALOG insulin: Increase dose to 5 UNITS before each meal   Check blood sugar after supper around 8 PM every other day

## 2015-09-20 LAB — CULTURE, BLOOD (ROUTINE X 2)
CULTURE: NO GROWTH
Culture: NO GROWTH

## 2015-09-20 NOTE — Telephone Encounter (Signed)
Please confirm that nursing home is following new instructions sent with the patient for insulin

## 2015-09-24 NOTE — Telephone Encounter (Signed)
Need to get blood sugar readings and current insulin doses from nursing home

## 2015-09-25 NOTE — Telephone Encounter (Signed)
Left message to fax readings and insulin doses

## 2015-11-11 ENCOUNTER — Telehealth: Payer: Self-pay | Admitting: Endocrinology

## 2015-11-11 NOTE — Telephone Encounter (Signed)
Orders faxed to patients nursing home. 

## 2015-11-11 NOTE — Telephone Encounter (Signed)
Blood sugars are high, insulin changes as follows:  Reduce LANTUS insulin to 32 units Humalog 6 units at breakfast, 7 units at lunch and 8 units at dinner  Needs to change follow-up to 2-3 weeks from now instead of January

## 2015-12-05 ENCOUNTER — Encounter: Payer: Self-pay | Admitting: Endocrinology

## 2015-12-05 ENCOUNTER — Ambulatory Visit (INDEPENDENT_AMBULATORY_CARE_PROVIDER_SITE_OTHER): Payer: Medicare Other | Admitting: Endocrinology

## 2015-12-05 VITALS — BP 122/70 | HR 63 | Temp 97.8°F | Resp 14 | Ht 72.0 in | Wt 170.0 lb

## 2015-12-05 DIAGNOSIS — E1165 Type 2 diabetes mellitus with hyperglycemia: Secondary | ICD-10-CM | POA: Diagnosis not present

## 2015-12-05 DIAGNOSIS — IMO0002 Reserved for concepts with insufficient information to code with codable children: Secondary | ICD-10-CM

## 2015-12-05 DIAGNOSIS — E1340 Other specified diabetes mellitus with diabetic neuropathy, unspecified: Secondary | ICD-10-CM

## 2015-12-05 LAB — POCT GLYCOSYLATED HEMOGLOBIN (HGB A1C): Hemoglobin A1C: 8.3

## 2015-12-05 NOTE — Progress Notes (Signed)
Patient ID: Javier SerRobert L Murillo, male   DOB: 1930/11/28, 81 y.o.   MRN: [id number]           Reason for Appointment: Follow-up for Type 2 Diabetes  Referring physician: [name]  History of Present Illness:          Diagnosis: Type 2 diabetes mellitus, date of diagnosis:1975        Past history: He may have been treated with oral agents for the first few years of his diabetes management He thinks he has been on insulin for several years, uncertain how long. For some reason he has been continued on glyburide also which she probably has been taking for a long time Not clear if he had taken metformin or why it was stopped  He has had fair control of his diabetes over the last 2-3 years with A1c ranging from 7.4-9.3, relatively higher since 10/2013  On his initial consultation he had been on a fixed dose of Lantus along with sliding scale Humalog based on blood sugar levels before meals Because of persistently high A1c of 9% or more since 06/2014 he seen in consultation and he was started on pre-meal insulin along with correction doses His glyburide stopped because of his age and needing basal bolus insulin   Recent history:  INSULIN regimen is described as:   Lantus 32 units in a.m., Humalog 6-7-8 units before meals Correction doses for high readings: If blood sugar below 200: No insulin  Blood sugar 200-249 = 1 unit 250 and above = 2 units  Currently taking basal bolus insulin regimen only for diabetes He appears to be requiring relatively higher dose of mealtime insulin more recently A1c is still over 8% although insulin dose for his Humalog was changed only about 3 weeks ago by review of his faxed that sugars His diet is somewhat variable More recently because of his being restricted to his room he has not had any choices in his meals    His insulin doses have been periodically adjusted based on his blood sugars sent by the fax from the nursing home.  No hypoglycemia  Blood sugars  reviewed from a copy of the from nursing home are difficult to read  Mean values apply above for all meters except median for One Touch  PRE-MEAL Fasting Lunch Dinner Bedtime Overall  Glucose range: 108-239 71-242 155-419  300+    Mean/median:        Diet: He is generally ordering whatever he wants on his menu at the nursing home and he usually finishes his meal.  He thinks he does not order any dessert  He is eating eggs, meat and toast at breakfast Hypoglycemia: None          Glucose monitoring:  done 2-3 times a day Glucometer: Unknown       Blood Glucose readings by time of day and averages from recent fax As above   Self-care: The diet that the patient has been following is: None, patient is generally eating liberal diet which he orders at his nursing home    Meals: 3 meals per day. Breakfast is eggs, toast, sausage.  Lunch and dinner usually a meat or fish along with vegetables, bread and sugar-free ice cream.  Usually has sugar-free snacks           Physical activity:  none, in wheelchair         Dietician visit, most recent: Never .  Weight history:  Wt Readings from Last 3 Encounters:  12/05/15 170 lb (77.111 kg)  09/19/15 173 lb (78.472 kg)  09/15/15 175 lb 4.3 oz (79.5 kg)    Glycemic control:   Lab Results  Component Value Date   HGBA1C 8.3 12/05/2015   HGBA1C 8.5 07/08/2015   HGBA1C 9.5* 04/08/2015   Lab Results  Component Value Date   LDLCALC 99 04/08/2015   CREATININE 1.32* 09/17/2015    Urine microalbumin/creatinine ratio 50.4 done on 10/27/13     Medication List       This list is accurate as of: 12/05/15 11:59 PM.  Always use your most recent med list.               amLODipine 5 MG tablet  Commonly known as:  NORVASC  Take 1 tablet (5 mg total) by mouth daily.     cholecalciferol 1000 units tablet  Commonly known as:  VITAMIN D  Take 1,000 Units by mouth daily.     ferrous sulfate 325 (65 FE) MG tablet  Take 325 mg by  mouth daily with breakfast.     FLUoxetine 20 MG capsule  Commonly known as:  PROZAC  Take 20 mg by mouth daily.     furosemide 20 MG tablet  Commonly known as:  LASIX  Take 1 tablet (20 mg total) by mouth daily.     HUMALOG 100 UNIT/ML injection  Generic drug:  insulin lispro  Inject 3-4 Units into the skin 4 (four) times daily -  with meals and at bedtime. Gets 3 units before each meal except 4 units at supper and also on sliding scale     insulin glargine 100 UNIT/ML injection  Commonly known as:  LANTUS  Inject 33 Units into the skin daily.     lisinopril 10 MG tablet  Commonly known as:  PRINIVIL,ZESTRIL  Take 1 tablet (10 mg total) by mouth daily.        Allergies:  Allergies  Allergen Reactions  . Fluvirin [Influenza Virus Vaccine Split] Other (See Comments)    Gave patient flu    Past Medical History  Diagnosis Date  . Quadriplegia and quadriparesis (HCC) 08/20/2014  . Diabetic peripheral neuropathy (HCC)   . Gait disorder   . Peripheral vascular disease (HCC)   . Degenerative arthritis   . Lumbosacral spondylosis   . Depression   . Chronic low back pain   . Kidney stones     only once  . Hypertension   . Hyperlipidemia   . Arthritis   . Diabetes mellitus without complication [facility])     Past Surgical History  Procedure Laterality Date  . Shoulder surgery Bilateral   . Cataract extraction w/ intraocular lens  implant, bilateral    . Bursitis Bilateral     olecranon I&D  . Lumbar spine surgery      x7  . Back surgery      x 7  . Eye surgery      bilateral cataracts  . Bone spur removed  right sholder    . Appendectomy    . Anterior cervical decomp/discectomy fusion N/A 08/22/2014    Procedure: ANTERIOR CERVICAL DECOMPRESSION/DISCECTOMY FUSION, cervical three-four;  Surgeon: Hewitt Shorts, MD;  Location: [facility];  Service: Neurosurgery;  Laterality: N/A;  C3-4 anterior cervical decompression with fusion plating and bonegraft    Family  History  Problem Relation Age of Onset  . Cancer Mother   . Heart attack Father   .  Dementia Sister     Social History:  reports that he has quit smoking. His smoking use included Cigarettes. He has a 10 pack-year smoking history. He does not have any smokeless tobacco history on file. He reports that he does not drink alcohol or use illicit drugs.    Review of Systems     Recently seen by PCP and reportedly had mild dehydration       Lipids: not on any medications for hyperlipidemia       Lab Results  Component Value Date   CHOL 169 04/08/2015   HDL 54.80 04/08/2015   LDLCALC 99 04/08/2015   TRIG 78.0 04/08/2015   CHOLHDL 3 04/08/2015               He does have weakness in his legs from central cord problems previously and not able to ambulate Also some numbness in fingers   Physical Examination:  BP 122/70 mmHg  Pulse 63  Temp(Src) 97.8 F (36.6 C)  Resp 14  Ht 6' (1.829 m)  Wt 170 lb (77.111 kg)  BMI 23.05 kg/m2  SpO2 96%  GENERAL: He is alert and conversing well   ASSESSMENT:  Diabetes type 2, uncontrolled  See history of present illness for detailed discussion of his current management, blood sugar patterns and problems identified He is on a basal bolus insulin regimen with good compliance with his insulin regimen His blood sugars are still difficult to control after meal is partly from inconsistent diet and limited choices at the nursing home Also difficult to analyze his blood sugars as he readings from nursing home are difficult to read on the faxed copies especially after dinner A1c is reasonable at 8.3 today  Mostly appears to be having high readings before and after evening meal  PLAN:   Increase lunchtime dose by 2 units and suppertime dose by 1 unit, he will now take 9 units before lunch and dinner Interview 30 to Lantus in the morning and 6 Humalog for breakfast  Will review blood sugars by fax periodically again  Written instructions given     Patient Instructions  As on NH sheet        [facility] 12/06/2015, 7:55 AM   Note: This office note was prepared with Dragon voice recognition system technology. Any transcriptional errors that result from this process are unintentional.

## 2015-12-05 NOTE — Patient Instructions (Signed)
As on NH sheet

## 2015-12-06 LAB — BASIC METABOLIC PANEL
BUN: 25 mg/dL — ABNORMAL HIGH (ref 6–23)
CHLORIDE: 99 meq/L (ref 96–112)
CO2: 31 mEq/L (ref 19–32)
Calcium: 10.1 mg/dL (ref 8.4–10.5)
Creatinine, Ser: 1.38 mg/dL (ref 0.40–1.50)
GFR: 52.24 mL/min — ABNORMAL LOW (ref >60.00)
Glucose, Bld: 238 mg/dL — ABNORMAL HIGH (ref 70–99)
POTASSIUM: 4.7 meq/L (ref 3.5–5.1)
Sodium: 137 mEq/L (ref 135–145)

## 2015-12-06 LAB — LIPID PANEL
CHOLESTEROL: 164 mg/dL (ref 0–200)
HDL: 54.9 mg/dL (ref >39.00)
LDL CALC: 80 mg/dL (ref 0–99)
NonHDL: 109.33
TRIGLYCERIDES: 146 mg/dL (ref 0.0–149.0)
Total CHOL/HDL Ratio: 3
VLDL: 29.2 mg/dL (ref 0.0–40.0)

## 2015-12-19 ENCOUNTER — Ambulatory Visit: Payer: Medicare Other | Admitting: Endocrinology

## 2016-01-03 ENCOUNTER — Telehealth: Payer: Self-pay | Admitting: Endocrinology

## 2016-01-03 NOTE — Telephone Encounter (Signed)
It was faxed to the nursing home.

## 2016-01-03 NOTE — Telephone Encounter (Signed)
Blood sugars faxed from nursing home show mostly high readings at 4 PM ranging from 194-364, not consistently high other times To take 11 units Humalog at lunchtime instead of 9 units, please fax

## 2016-01-24 ENCOUNTER — Telehealth: Payer: Self-pay | Admitting: Endocrinology

## 2016-01-24 NOTE — Telephone Encounter (Signed)
Orders are faxed.

## 2016-01-24 NOTE — Telephone Encounter (Signed)
Blood sugars for the last week show persistently high readings at suppertime ranging from 224-348  Need to fax instructions to [facility] to change Lantus to 18 units at breakfast and suppertime starting from 2/18

## 2016-02-02 ENCOUNTER — Emergency Department (HOSPITAL_COMMUNITY)
Admission: EM | Admit: 2016-02-02 | Discharge: 2016-02-02 | Disposition: A | Discharge status: Home / Self Care | Payer: Medicare Other | Attending: Emergency Medicine | Admitting: Emergency Medicine

## 2016-02-02 ENCOUNTER — Encounter (HOSPITAL_COMMUNITY): Payer: Self-pay

## 2016-02-02 ENCOUNTER — Emergency Department (HOSPITAL_COMMUNITY): Payer: Medicare Other

## 2016-02-02 DIAGNOSIS — Z8739 Personal history of other diseases of the musculoskeletal system and connective tissue: Secondary | ICD-10-CM | POA: Diagnosis not present

## 2016-02-02 DIAGNOSIS — N189 Chronic kidney disease, unspecified: Secondary | ICD-10-CM | POA: Diagnosis not present

## 2016-02-02 DIAGNOSIS — R0602 Shortness of breath: Secondary | ICD-10-CM | POA: Insufficient documentation

## 2016-02-02 DIAGNOSIS — Z79899 Other long term (current) drug therapy: Secondary | ICD-10-CM | POA: Diagnosis not present

## 2016-02-02 DIAGNOSIS — Z87891 Personal history of nicotine dependence: Secondary | ICD-10-CM | POA: Diagnosis not present

## 2016-02-02 DIAGNOSIS — F329 Major depressive disorder, single episode, unspecified: Secondary | ICD-10-CM | POA: Diagnosis not present

## 2016-02-02 DIAGNOSIS — R531 Weakness: Secondary | ICD-10-CM

## 2016-02-02 DIAGNOSIS — Z87442 Personal history of urinary calculi: Secondary | ICD-10-CM | POA: Diagnosis not present

## 2016-02-02 DIAGNOSIS — Z794 Long term (current) use of insulin: Secondary | ICD-10-CM | POA: Diagnosis not present

## 2016-02-02 DIAGNOSIS — I129 Hypertensive chronic kidney disease with stage 1 through stage 4 chronic kidney disease, or unspecified chronic kidney disease: Secondary | ICD-10-CM | POA: Insufficient documentation

## 2016-02-02 DIAGNOSIS — E1140 Type 2 diabetes mellitus with diabetic neuropathy, unspecified: Secondary | ICD-10-CM | POA: Diagnosis not present

## 2016-02-02 DIAGNOSIS — E860 Dehydration: Secondary | ICD-10-CM | POA: Diagnosis not present

## 2016-02-02 DIAGNOSIS — G8929 Other chronic pain: Secondary | ICD-10-CM | POA: Diagnosis not present

## 2016-02-02 LAB — URINALYSIS, ROUTINE W REFLEX MICROSCOPIC
Bilirubin Urine: NEGATIVE
GLUCOSE, UA: NEGATIVE mg/dL
HGB URINE DIPSTICK: NEGATIVE
KETONES UR: NEGATIVE mg/dL
Nitrite: NEGATIVE
PROTEIN: NEGATIVE mg/dL
Specific Gravity, Urine: 1.022 (ref 1.005–1.030)
pH: 5 (ref 5.0–8.0)

## 2016-02-02 LAB — COMPREHENSIVE METABOLIC PANEL
ALBUMIN: 4.5 g/dL (ref 3.5–5.0)
ALK PHOS: 61 U/L (ref 38–126)
ALT: 29 U/L (ref 17–63)
ANION GAP: 11 (ref 5–15)
AST: 72 U/L — ABNORMAL HIGH (ref 15–41)
BILIRUBIN TOTAL: 0.6 mg/dL (ref 0.3–1.2)
BUN: 42 mg/dL — ABNORMAL HIGH (ref 6–20)
CALCIUM: 9.3 mg/dL (ref 8.9–10.3)
CO2: 27 mmol/L (ref 22–32)
Chloride: 99 mmol/L — ABNORMAL LOW (ref 101–111)
Creatinine, Ser: 1.62 mg/dL — ABNORMAL HIGH (ref 0.61–1.24)
GFR calc Af Amer: 44 mL/min — ABNORMAL LOW (ref >60)
GFR, EST NON AFRICAN AMERICAN: 38 mL/min — AB (ref >60)
GLUCOSE: 142 mg/dL — AB (ref 65–99)
POTASSIUM: 4.3 mmol/L (ref 3.5–5.1)
Sodium: 137 mmol/L (ref 135–145)
TOTAL PROTEIN: 7.3 g/dL (ref 6.5–8.1)

## 2016-02-02 LAB — CBG MONITORING, ED: Glucose-Capillary: 114 mg/dL — ABNORMAL HIGH (ref 65–99)

## 2016-02-02 LAB — URINE MICROSCOPIC-ADD ON: RBC / HPF: NONE SEEN RBC/hpf (ref 0–5)

## 2016-02-02 LAB — CBC WITH DIFFERENTIAL/PLATELET
BASOS ABS: 0 10*3/uL (ref 0.0–0.1)
BASOS PCT: 0 %
EOS PCT: 0 %
Eosinophils Absolute: 0 10*3/uL (ref 0.0–0.7)
HEMATOCRIT: 42.5 % (ref 39.0–52.0)
Hemoglobin: 13.8 g/dL (ref 13.0–17.0)
LYMPHS PCT: 9 %
Lymphs Abs: 0.7 10*3/uL (ref 0.7–4.0)
MCH: 33 pg (ref 26.0–34.0)
MCHC: 32.5 g/dL (ref 30.0–36.0)
MCV: 101.7 fL — ABNORMAL HIGH (ref 78.0–100.0)
MONO ABS: 1.2 10*3/uL — AB (ref 0.1–1.0)
Monocytes Relative: 14 %
NEUTROS ABS: 6.3 10*3/uL (ref 1.7–7.7)
Neutrophils Relative %: 77 %
PLATELETS: 284 10*3/uL (ref 150–400)
RBC: 4.18 MIL/uL — AB (ref 4.22–5.81)
RDW: 12.5 % (ref 11.5–15.5)
WBC: 8.2 10*3/uL (ref 4.0–10.5)

## 2016-02-02 LAB — BRAIN NATRIURETIC PEPTIDE: B NATRIURETIC PEPTIDE 5: 24.5 pg/mL (ref 0.0–100.0)

## 2016-02-02 LAB — I-STAT TROPONIN, ED: Troponin i, poc: 0.03 ng/mL (ref 0.00–0.08)

## 2016-02-02 MED ORDER — SODIUM CHLORIDE 0.9 % IV BOLUS (SEPSIS)
500.0000 mL | Freq: Once | INTRAVENOUS | Status: AC
  Administered 2016-02-02: 500 mL via INTRAVENOUS

## 2016-02-02 NOTE — ED Provider Notes (Signed)
CSN: [id number]     Arrival date & time 02/02/16  1058 History   First MD Initiated Contact with Patient 02/02/16 1138     Chief Complaint  Patient presents with  . Weakness     (Consider location/radiation/quality/duration/timing/severity/associated sxs/prior Treatment) HPI Comments: Patient is an 82 year old male with history of diabetes, chronic renal insufficiency, diabetic neuropathy. He presents for evaluation of progressive weakness over the past several days. He is a resident of [address] assisted living. According to the staff there, he has had little appetite and increasing weakness. The patient denies to me he is experiencing any specific symptoms outside of a runny nose and congestion. He denies any chest pain but does report slight difficulty breathing.  Patient is a 82 y.o. male presenting with weakness. The history is provided by the patient.  Weakness This is a new problem. The current episode started 2 days ago. The problem occurs constantly. Associated symptoms include shortness of breath. Pertinent negatives include no chest pain. Nothing aggravates the symptoms. Nothing relieves the symptoms. He has tried nothing for the symptoms. The treatment provided no relief.    Past Medical History  Diagnosis Date  . Quadriplegia and quadriparesis (HCC) 08/20/2014  . Diabetic peripheral neuropathy (HCC)   . Gait disorder   . Peripheral vascular disease (HCC)   . Degenerative arthritis   . Lumbosacral spondylosis   . Depression   . Chronic low back pain   . Kidney stones     only once  . Hypertension   . Hyperlipidemia   . Arthritis   . Diabetes mellitus without complication [facility])    Past Surgical History  Procedure Laterality Date  . Shoulder surgery Bilateral   . Cataract extraction w/ intraocular lens  implant, bilateral    . Bursitis Bilateral     olecranon I&D  . Lumbar spine surgery      x7  . Back surgery      x 7  . Eye surgery      bilateral  cataracts  . Bone spur removed  right sholder    . Appendectomy    . Anterior cervical decomp/discectomy fusion N/A 08/22/2014    Procedure: ANTERIOR CERVICAL DECOMPRESSION/DISCECTOMY FUSION, cervical three-four;  Surgeon: Hewitt Shorts, MD;  Location: [facility];  Service: Neurosurgery;  Laterality: N/A;  C3-4 anterior cervical decompression with fusion plating and bonegraft   Family History  Problem Relation Age of Onset  . Cancer Mother   . Heart attack Father   . Dementia Sister    Social History  Substance Use Topics  . Smoking status: Former Smoker -- 1.00 packs/day for 10 years    Types: Cigarettes  . Smokeless tobacco: None  . Alcohol Use: No     Comment: very rare    Review of Systems  Respiratory: Positive for shortness of breath.   Cardiovascular: Negative for chest pain.  Neurological: Positive for weakness.  All other systems reviewed and are negative.     Allergies  Fluvirin  Home Medications   Prior to Admission medications   Medication Sig Start Date End Date Taking? Authorizing Provider  amLODipine (NORVASC) 5 MG tablet Take 1 tablet (5 mg total) by mouth daily. 09/17/15  Yes Meredeth Ide, MD  cholecalciferol (VITAMIN D) 1000 UNITS tablet Take 1,000 Units by mouth daily.    Yes Historical Provider, MD  ferrous sulfate 325 (65 FE) MG tablet Take 325 mg by mouth daily with breakfast.   Yes Historical Provider, MD  FLUoxetine (PROZAC) 20 MG capsule Take 20 mg by mouth daily.   Yes Historical Provider, MD  furosemide (LASIX) 20 MG tablet Take 1 tablet (20 mg total) by mouth daily. 09/17/15  Yes Meredeth Ide, MD  HUMALOG 100 UNIT/ML injection Inject 6-9 Units into the skin 4 (four) times daily -  with meals and at bedtime. 6 units with breakfast 9 units before lunch and 9 units before supper. If BS<200 = 0 units 200-249=1 unit >250=2 units every other night at bedtime at 8pm. 03/26/15  Yes Historical Provider, MD  insulin glargine (LANTUS) 100 UNIT/ML  injection Inject 32 Units into the skin daily.    Yes Historical Provider, MD  lisinopril (PRINIVIL,ZESTRIL) 10 MG tablet Take 1 tablet (10 mg total) by mouth daily. 09/17/15  Yes Meredeth Ide, MD   BP 122/59 mmHg  Pulse 78  Temp(Src) 98.5 F (36.9 C) (Oral)  Resp 20  SpO2 96% Physical Exam  Constitutional: He is oriented to person, place, and time. He appears well-developed and well-nourished.  Patient is an elderly male in no acute distress. He is somnolent but arousable. He is oriented 4 when awakened.  HENT:  Head: Normocephalic and atraumatic.  Mouth/Throat: Oropharynx is clear and moist.  Neck: Normal range of motion. Neck supple.  Cardiovascular: Normal rate, regular rhythm and normal heart sounds.   No murmur heard. Pulmonary/Chest: Effort normal and breath sounds normal. No respiratory distress. He has no wheezes. He has no rales.  Abdominal: Soft. Bowel sounds are normal. He exhibits no distension. There is no tenderness.  Musculoskeletal: Normal range of motion. He exhibits no edema.  Lymphadenopathy:    He has no cervical adenopathy.  Neurological: He is oriented to person, place, and time. No cranial nerve deficit. He exhibits normal muscle tone. Coordination normal.  Skin: Skin is warm and dry.  Nursing note and vitals reviewed.   ED Course  Procedures (including critical care time) Labs Review Labs Reviewed  CBG MONITORING, ED - Abnormal; Notable for the following:    Glucose-Capillary 114 (*)    All other components within normal limits  COMPREHENSIVE METABOLIC PANEL  CBC WITH DIFFERENTIAL/PLATELET  BRAIN NATRIURETIC PEPTIDE  URINALYSIS, ROUTINE W REFLEX MICROSCOPIC (NOT AT [facility])  I-STAT TROPOININ, ED    Imaging Review No results found. I have personally reviewed and evaluated these images and lab results as part of my medical decision-making.   EKG Interpretation   Date/Time:  Sunday February 02 2016 11:36:31 EST Ventricular Rate:  75 PR Interval:   129 QRS Duration: 100 QT Interval:  415 QTC Calculation: 463 R Axis:   -16 Text Interpretation:  Sinus rhythm Inferior infarct, old Anterior infarct,  old Lateral leads are also involved Baseline wander in lead(s) V1 V6  Confirmed by Loucile Posner  MD, Camiyah Friberg ([id number]) on 02/02/2016 12:43:05 PM      MDM   Final diagnoses:  None    Patient is an 82 year old male with history of hypertension, renal insufficiency. He was brought for evaluation of weakness. He has had decreased by mouth intake for the past 2 days. He is somnolent but easily arousable and appropriate when awakened. His neurologic exam is nonfocal. His workup reveals no fever, no leukocytosis, normal troponin and changed EKG. His urinalysis is clear. The only abnormality that is significant at this time is an elevated BUN and creatinine which I suspect is related to mild dehydration. He normally has some renal insufficiency, however this is to have somewhat higher degree. He was  hydrated with 1 L of normal saline and appears to be perking up. At this point I feel as though he is appropriate for discharge.  I had a long discussion with the family regarding the disposition. They tell me he has plenty of help in his current situation and feel as though he would be looked after. I will discharge him back to the assisted living facility and advised him to increase his by mouth intake for the next several days, and return to the ER if his symptoms worsen or change.    Geoffery Lyons, MD 02/02/16 [phone]

## 2016-02-02 NOTE — ED Notes (Signed)
Per family.  Pt from [facility]. Pt has been more weak and had loss of appetite.  Slight congestion.  Facility also stated bp was high this morning.

## 2016-02-02 NOTE — Discharge Instructions (Signed)
Increase your to 8 ounce glasses of water per day for the next several days.  Return to the emergency department if you develop increased weakness, fever, difficulty breathing, or other new and concerning symptoms.   Dehydration, Adult Dehydration is a condition in which you do not have enough fluid or water in your body. It happens when you take in less fluid than you lose. Vital organs such as the kidneys, brain, and heart cannot function without a proper amount of fluids. Any loss of fluids from the body can cause dehydration.  Dehydration can range from mild to severe. This condition should be treated right away to help prevent it from becoming severe. CAUSES  This condition may be caused by:  Vomiting.  Diarrhea.  Excessive sweating, such as when exercising in hot or humid weather.  Not drinking enough fluid during strenuous exercise or during an illness.  Excessive urine output.  Fever.  Certain medicines. RISK FACTORS This condition is more likely to develop in:  People who are taking certain medicines that cause the body to lose excess fluid (diuretics).   People who have a chronic illness, such as diabetes, that may increase urination.  Older adults.   People who live at high altitudes.   People who participate in endurance sports.  SYMPTOMS  Mild Dehydration  Thirst.  Dry lips.  Slightly dry mouth.  Dry, warm skin. Moderate Dehydration  Very dry mouth.   Muscle cramps.   Dark urine and decreased urine production.   Decreased tear production.   Headache.   Light-headedness, especially when you stand up from a sitting position.  Severe Dehydration  Changes in skin.   Cold and clammy skin.   Skin does not spring back quickly when lightly pinched and released.   Changes in body fluids.   Extreme thirst.   No tears.   Not able to sweat when body temperature is high, such as in hot weather.   Minimal urine production.    Changes in vital signs.   Rapid, weak pulse (more than 100 beats per minute when you are sitting still).   Rapid breathing.   Low blood pressure.   Other changes.   Sunken eyes.   Cold hands and feet.   Confusion.  Lethargy and difficulty being awakened.  Fainting (syncope).   Short-term weight loss.   Unconsciousness. DIAGNOSIS  This condition may be diagnosed based on your symptoms. You may also have tests to determine how severe your dehydration is. These tests may include:   Urine tests.   Blood tests.  TREATMENT  Treatment for this condition depends on the severity. Mild or moderate dehydration can often be treated at home. Treatment should be started right away. Do not wait until dehydration becomes severe. Severe dehydration needs to be treated at the hospital. Treatment for Mild Dehydration  Drinking plenty of water to replace the fluid you have lost.   Replacing minerals in your blood (electrolytes) that you may have lost.  Treatment for Moderate Dehydration  Consuming oral rehydration solution (ORS). Treatment for Severe Dehydration  Receiving fluid through an IV tube.   Receiving electrolyte solution through a feeding tube that is passed through your nose and into your stomach (nasogastric tube or NG tube).  Correcting any abnormalities in electrolytes. HOME CARE INSTRUCTIONS   Drink enough fluid to keep your urine clear or pale yellow.   Drink water or fluid slowly by taking small sips. You can also try sucking on ice cubes.  Have  food or beverages that contain electrolytes. Examples include bananas and sports drinks.  Take over-the-counter and prescription medicines only as told by your health care provider.   Prepare ORS according to the manufacturer's instructions. Take sips of ORS every 5 minutes until your urine returns to normal.  If you have vomiting or diarrhea, continue to try to drink water, ORS, or both.   If  you have diarrhea, avoid:   Beverages that contain caffeine.   Fruit juice.   Milk.   Carbonated soft drinks.  Do not take salt tablets. This can lead to the condition of having too much sodium in your body (hypernatremia).  SEEK MEDICAL CARE IF:  You cannot eat or drink without vomiting.  You have had moderate diarrhea during a period of more than 24 hours.  You have a fever. SEEK IMMEDIATE MEDICAL CARE IF:   You have extreme thirst.  You have severe diarrhea.  You have not urinated in 6-8 hours, or you have urinated only a small amount of very dark urine.  You have shriveled skin.  You are dizzy, confused, or both.   This information is not intended to replace advice given to you by your health care provider. Make sure you discuss any questions you have with your health care provider.   Document Released: 11/23/2005 Document Revised: 08/14/2015 Document Reviewed: 04/10/2015 Elsevier Interactive Patient Education [employer]

## 2016-02-19 IMAGING — MR MR CERVICAL SPINE W/O CM
4 of 5 series · 20 of 48 positions shown · non-contrast
Comparison: MRI brain 07/23/2014.  Cervical spine CT 07/23/2014.

CLINICAL DATA: Neck pain with weakness and loss of use in limbs for
1.5 months. History of diabetes with peripheral neuropathy.

EXAM:
MRI CERVICAL SPINE WITHOUT CONTRAST
TECHNIQUE: Multiplanar, multisequence MR imaging of the cervical spine was
performed. No intravenous contrast was administered.

[Series 3: T2 · sagittal · 3.0mm · 0.41mm/px · 7 of 12 slices shown (1 of 3)]
[im 1/12]
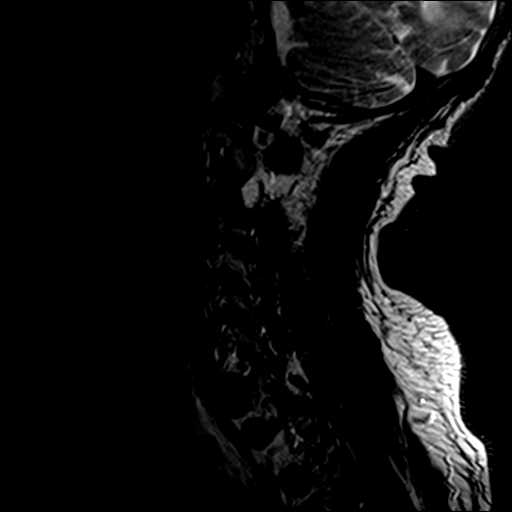
[im 2/12]
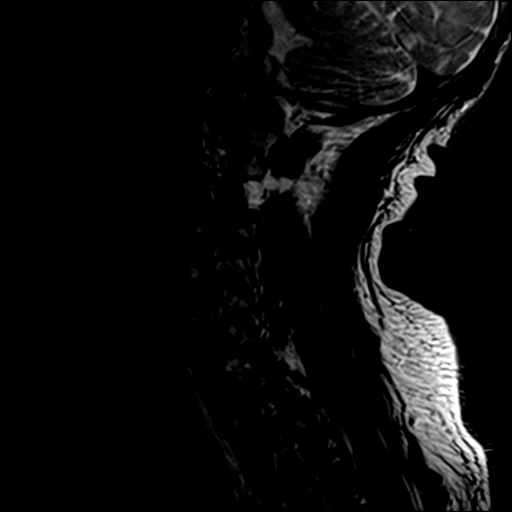
[im 4/12]
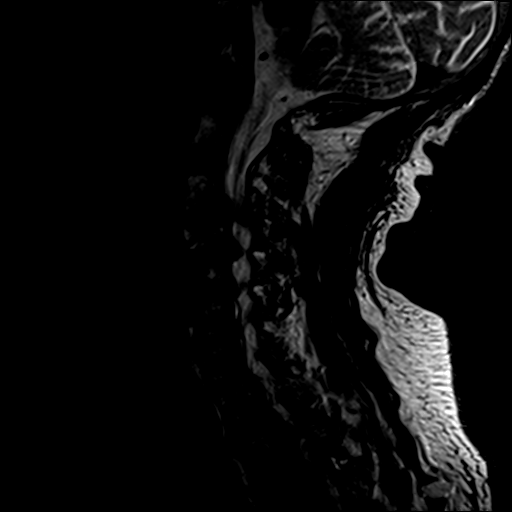
[im 6/12]
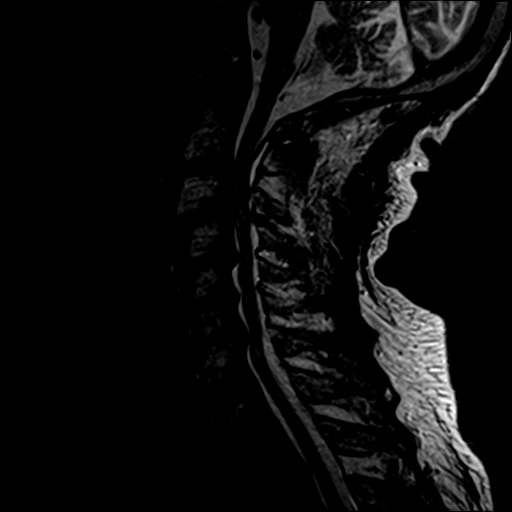
[im 8/12]
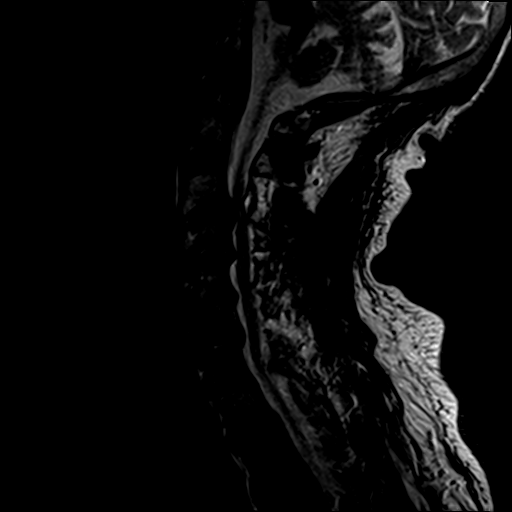
[im 10/12]
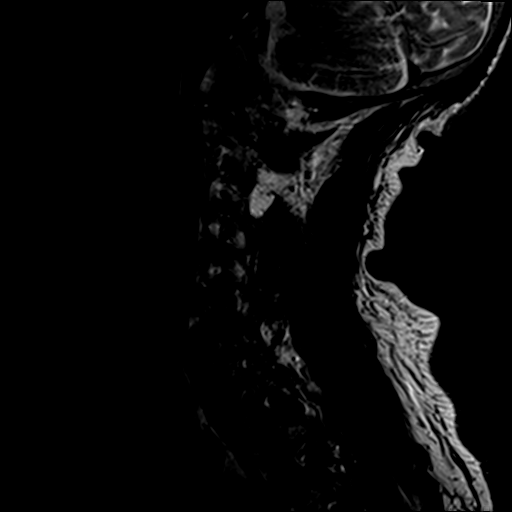
[im 12/12]
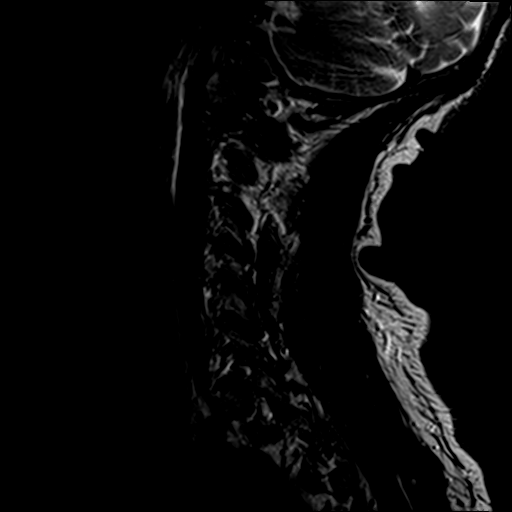

[Series 4: T1 · sagittal · 3.0mm · 0.41mm/px · 3 of 12 slices shown]
[im 2/12]
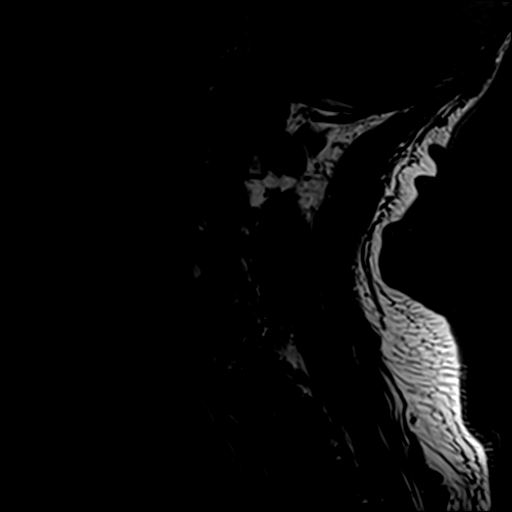
[im 6/12]
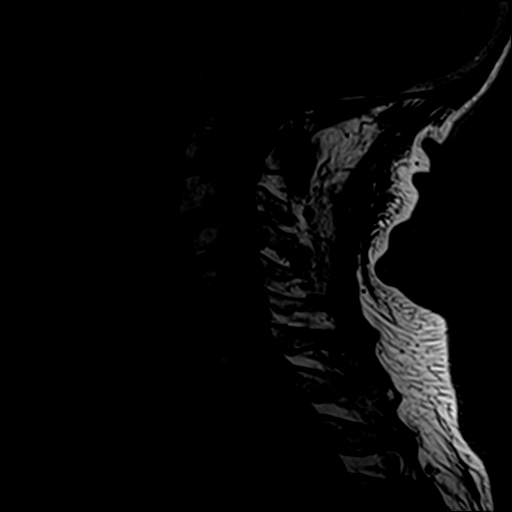
[im 10/12]
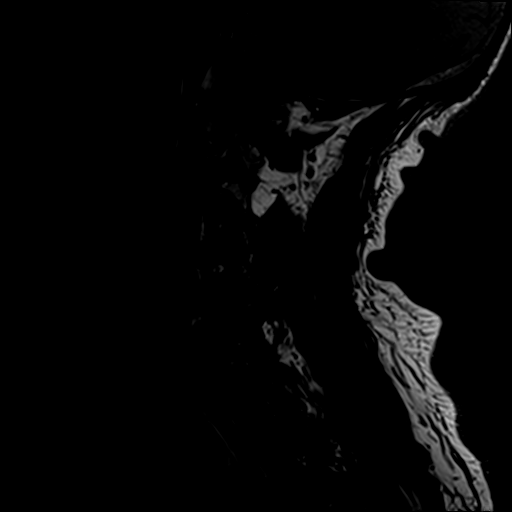

[Series 6: T2 · axial · 3.0mm · 0.39mm/px · z∈[-49,+29]mm · 7 of 27 slices shown (2 of 3)]
[im 1/27]
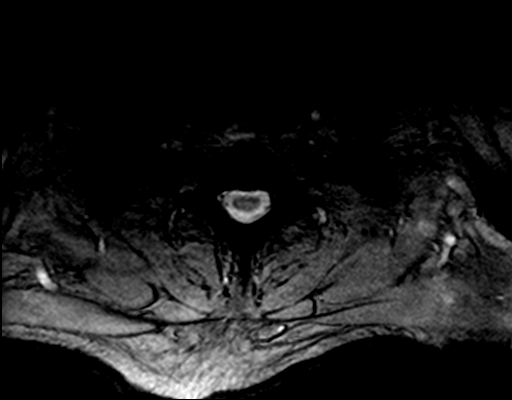
[im 5/27]
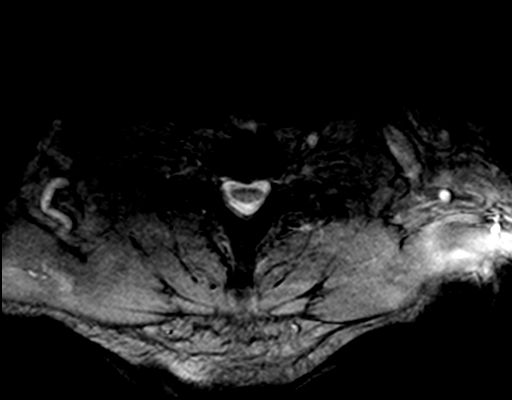
[im 9/27]
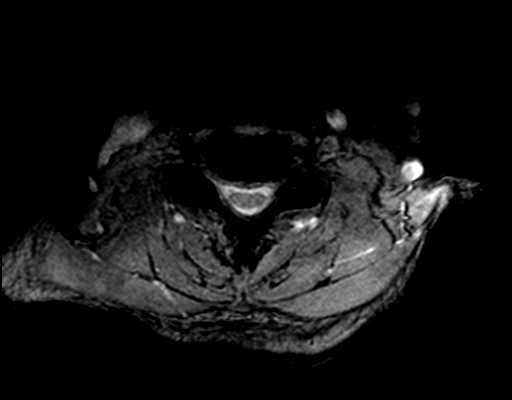
[im 13/27]
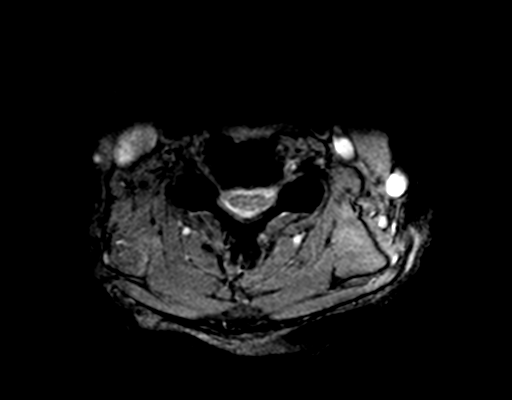
[im 15/27]
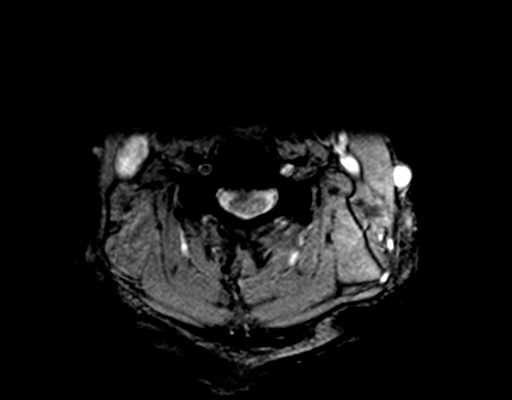
[im 19/27]
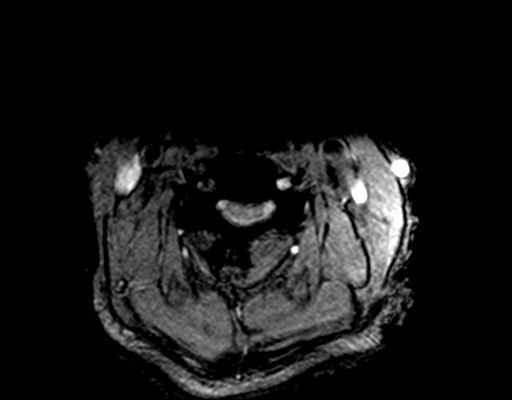
[im 23/27]
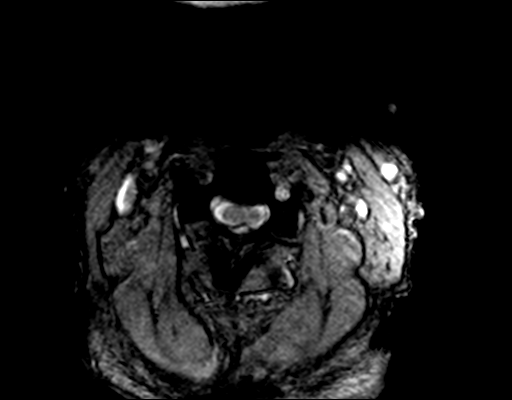

[Series 7: T2 · axial · 3.0mm · 0.39mm/px · z∈[-35,+29]mm · 3 of 27 slices shown (3 of 3)]
[im 5/27]
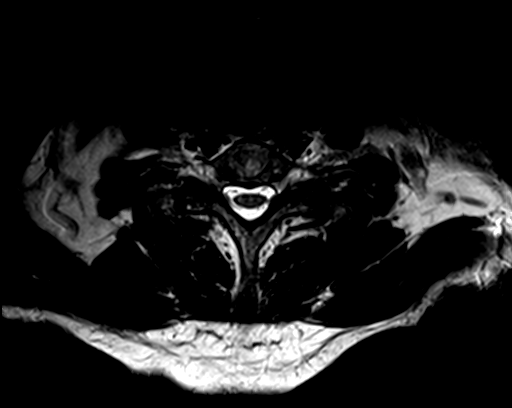
[im 15/27]
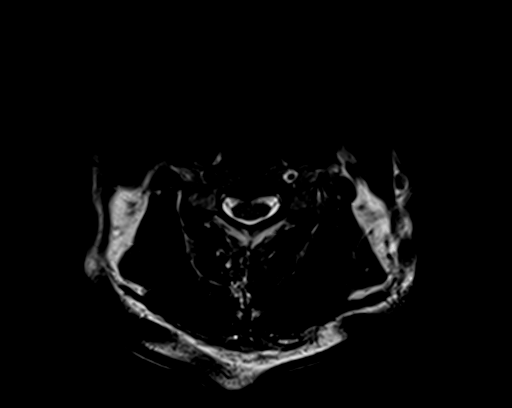
[im 23/27]
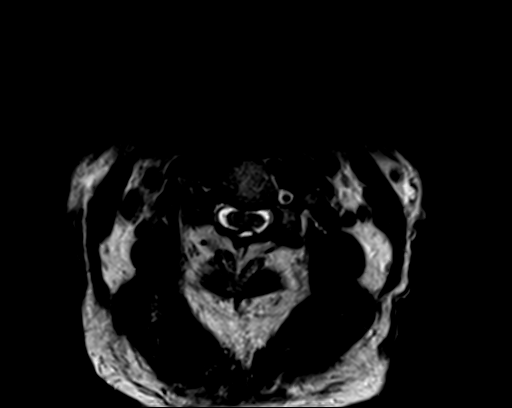

[20 of 48 positions shown; findings below may reference images not displayed]

FINDINGS: The alignment is unchanged from the prior CT. There is a grade 1
anterolisthesis at C3-4 secondary to advanced facet disease. There
is no evidence of acute fracture. The cervical spinal canal is small
on a congenital basis.

The craniocervical junction appears normal. There is cord flattening
at C3-4 with mild cord hyperintensity. No other abnormal cord signal
is demonstrated. There is a posterior epidural fluid collection
extending cephalad from the C3-4 disc space level to the C2 level.
This is slightly off midline to the left. There are bilateral
vertebral artery flow voids.

C2-3: Asymmetric facet hypertrophy on the left. No significant
spinal stenosis or nerve root encroachment.

C3-4: There is cord compression with narrowing of the AP diameter of
the canal to approximately 4 mm. This is due to a broad-based
central disc protrusion, advanced facet disease, the resulting grade
1 anterolisthesis and the posterior epidural fluid collection. As
above, there is some cord hyperintensity suspicious for
myelomalacia.

C4-5: There is spondylosis with asymmetric uncinate spurring on the
right. Relatively mild bilateral facet hypertrophy is present. The
AP diameter of the canal is narrowed to 6 mm without cord deformity.
There is moderate right-greater-than-left foraminal narrowing.

C5-6: There is spondylosis with posterior osteophytes, facet
hypertrophy and uncinate spurring bilaterally. The CSF surrounding
the cord is effaced without cord deformity. Moderate foraminal
narrowing is present bilaterally.

C6-7: There is chronic disc space loss with posterior osteophytes,
uncinate spurring and facet hypertrophy. The CSF surrounding the
cord is effaced without cord deformity. Mild foraminal narrowing is
present bilaterally.

C7-T1: Moderate bilateral facet hypertrophy. No cord deformity or
significant foraminal compromise.
IMPRESSION: 1. Cord compression at C3-4 secondary to a grade 1 anterolisthesis,
advanced disc degeneration and a posterior epidural fluid
collection, likely arising from the facet joints. This fluid does
extend superiorly to the C2 level. There is T2 hyperintensity within
the cord at the C3-4 disc space level suspicious for edema or early
myelomalacia.
2. Additional multilevel spondylosis as detailed above. There is
moderate spinal stenosis at C4-5 without cord compression.
3. No evidence of acute fracture.
4. Critical Value/emergent results were called by telephone at the
time of interpretation on 08/20/2014 at [DATE] to Dr. CRICLIVII GUTIUM
, who verbally acknowledged these results.

## 2016-02-21 ENCOUNTER — Emergency Department (HOSPITAL_COMMUNITY): Payer: Medicare Other

## 2016-02-21 ENCOUNTER — Encounter (HOSPITAL_COMMUNITY): Payer: Self-pay

## 2016-02-21 ENCOUNTER — Emergency Department (HOSPITAL_COMMUNITY)
Admission: EM | Admit: 2016-02-21 | Discharge: 2016-02-21 | Disposition: A | Discharge status: Home / Self Care | Payer: Medicare Other | Attending: Emergency Medicine | Admitting: Emergency Medicine

## 2016-02-21 DIAGNOSIS — F0390 Unspecified dementia without behavioral disturbance: Secondary | ICD-10-CM | POA: Insufficient documentation

## 2016-02-21 DIAGNOSIS — F329 Major depressive disorder, single episode, unspecified: Secondary | ICD-10-CM | POA: Insufficient documentation

## 2016-02-21 DIAGNOSIS — Z87891 Personal history of nicotine dependence: Secondary | ICD-10-CM | POA: Diagnosis not present

## 2016-02-21 DIAGNOSIS — R4182 Altered mental status, unspecified: Secondary | ICD-10-CM | POA: Diagnosis present

## 2016-02-21 DIAGNOSIS — Z8739 Personal history of other diseases of the musculoskeletal system and connective tissue: Secondary | ICD-10-CM | POA: Insufficient documentation

## 2016-02-21 DIAGNOSIS — I10 Essential (primary) hypertension: Secondary | ICD-10-CM | POA: Insufficient documentation

## 2016-02-21 DIAGNOSIS — R350 Frequency of micturition: Secondary | ICD-10-CM | POA: Diagnosis not present

## 2016-02-21 DIAGNOSIS — Z794 Long term (current) use of insulin: Secondary | ICD-10-CM | POA: Insufficient documentation

## 2016-02-21 DIAGNOSIS — R531 Weakness: Secondary | ICD-10-CM | POA: Insufficient documentation

## 2016-02-21 DIAGNOSIS — G8929 Other chronic pain: Secondary | ICD-10-CM | POA: Insufficient documentation

## 2016-02-21 DIAGNOSIS — E1142 Type 2 diabetes mellitus with diabetic polyneuropathy: Secondary | ICD-10-CM | POA: Insufficient documentation

## 2016-02-21 DIAGNOSIS — Z87442 Personal history of urinary calculi: Secondary | ICD-10-CM | POA: Insufficient documentation

## 2016-02-21 LAB — CBC
HCT: 44.4 % (ref 39.0–52.0)
Hemoglobin: 14.2 g/dL (ref 13.0–17.0)
MCH: 32.8 pg (ref 26.0–34.0)
MCHC: 32 g/dL (ref 30.0–36.0)
MCV: 102.5 fL — AB (ref 78.0–100.0)
PLATELETS: 291 10*3/uL (ref 150–400)
RBC: 4.33 MIL/uL (ref 4.22–5.81)
RDW: 12.6 % (ref 11.5–15.5)
WBC: 10.1 10*3/uL (ref 4.0–10.5)

## 2016-02-21 LAB — URINALYSIS, ROUTINE W REFLEX MICROSCOPIC
BILIRUBIN URINE: NEGATIVE
GLUCOSE, UA: 250 mg/dL — AB
HGB URINE DIPSTICK: NEGATIVE
KETONES UR: NEGATIVE mg/dL
LEUKOCYTES UA: NEGATIVE
Nitrite: NEGATIVE
PROTEIN: NEGATIVE mg/dL
Specific Gravity, Urine: 1.018 (ref 1.005–1.030)
pH: 6 (ref 5.0–8.0)

## 2016-02-21 LAB — I-STAT CG4 LACTIC ACID, ED: LACTIC ACID, VENOUS: 1.18 mmol/L (ref 0.5–2.0)

## 2016-02-21 LAB — COMPREHENSIVE METABOLIC PANEL
ALBUMIN: 4.4 g/dL (ref 3.5–5.0)
ALT: 21 U/L (ref 17–63)
AST: 23 U/L (ref 15–41)
Alkaline Phosphatase: 77 U/L (ref 38–126)
Anion gap: 9 (ref 5–15)
BUN: 27 mg/dL — AB (ref 6–20)
CHLORIDE: 104 mmol/L (ref 101–111)
CO2: 28 mmol/L (ref 22–32)
CREATININE: 1.17 mg/dL (ref 0.61–1.24)
Calcium: 9.8 mg/dL (ref 8.9–10.3)
GFR calc Af Amer: >60 mL/min (ref >60)
GFR, EST NON AFRICAN AMERICAN: 56 mL/min — AB (ref >60)
GLUCOSE: 137 mg/dL — AB (ref 65–99)
Potassium: 4 mmol/L (ref 3.5–5.1)
Sodium: 141 mmol/L (ref 135–145)
Total Bilirubin: 0.7 mg/dL (ref 0.3–1.2)
Total Protein: 7.1 g/dL (ref 6.5–8.1)

## 2016-02-21 MED ORDER — SODIUM CHLORIDE 0.9 % IV SOLN
INTRAVENOUS | Status: DC
  Administered 2016-02-21: 08:00:00 via INTRAVENOUS

## 2016-02-21 MED ORDER — ACETAMINOPHEN 500 MG PO TABS: 500.0000 mg | ORAL_TABLET | Freq: Four times a day (QID) | ORAL | Status: DC | PRN

## 2016-02-21 NOTE — ED Notes (Signed)
Patient transported to CT 

## 2016-02-21 NOTE — ED Notes (Signed)
MD at bedside. EDP [facility] 

## 2016-02-21 NOTE — ED Notes (Signed)
Bed: [id number] Expected date:  Expected time:  Means of arrival:  Comments: 38M/AMS

## 2016-02-21 NOTE — ED Notes (Addendum)
MD at bedside. EDP [facility] SPEAKING WITH FAMILY AT PRESENT

## 2016-02-21 NOTE — ED Notes (Signed)
Delay in discharge. Family will transport pt home

## 2016-02-21 NOTE — Discharge Instructions (Signed)
As discussed, your evaluation today has been largely reassuring.  But, it is important that you monitor your condition carefully, and do not hesitate to return to the ED if you develop new, or concerning changes in your condition. ? ?Otherwise, please follow-up with your physician for appropriate ongoing care. ? ?

## 2016-02-21 NOTE — ED Provider Notes (Signed)
CSN: [id number]     Arrival date & time 02/21/16  [id number] History   First MD Initiated Contact with Patient 02/21/16 [phone]     Chief Complaint  Patient presents with  . Altered Mental Status  . Urinary Frequency  . Generalized Body Aches     (Consider location/radiation/quality/duration/timing/severity/associated sxs/prior Treatment) HPI Elderly male with dementia presents from his nursing facility due to staff concerns of decreased interactivity. Level V caveat secondary to dementia. The patient self offers brief responses to questioning, seems oriented to self, roughly to place, denies complaints. Per report, the patient has been notably less interactive than usual over the past 24 hours. No report of the fall, trauma. No report of new fever, vomiting.  After the initial evaluation, family members arrive, they corroborate history, that the patient is interacting less than usual, appears more sleepy. He states that this presentation is similar to a prior episode of dehydration.  Past Medical History  Diagnosis Date  . Quadriplegia and quadriparesis (HCC) 08/20/2014  . Diabetic peripheral neuropathy (HCC)   . Gait disorder   . Peripheral vascular disease (HCC)   . Degenerative arthritis   . Lumbosacral spondylosis   . Depression   . Chronic low back pain   . Kidney stones     only once  . Hypertension   . Hyperlipidemia   . Arthritis   . Diabetes mellitus without complication [facility](HCC)    Past Surgical History  Procedure Laterality Date  . Shoulder surgery Bilateral   . Cataract extraction w/ intraocular lens  implant, bilateral    . Bursitis Bilateral     olecranon I&D  . Lumbar spine surgery      x7  . Back surgery      x 7  . Eye surgery      bilateral cataracts  . Bone spur removed  right sholder    . Appendectomy    . Anterior cervical decomp/discectomy fusion N/A 08/22/2014    Procedure: ANTERIOR CERVICAL DECOMPRESSION/DISCECTOMY FUSION, cervical three-four;   Surgeon: Hewitt Shortsobert W Nudelman, MD;  Location: [facility];  Service: Neurosurgery;  Laterality: N/A;  C3-4 anterior cervical decompression with fusion plating and bonegraft   Family History  Problem Relation Age of Onset  . Cancer Mother   . Heart attack Father   . Dementia Sister    Social History  Substance Use Topics  . Smoking status: Former Smoker -- 1.00 packs/day for 10 years    Types: Cigarettes  . Smokeless tobacco: None  . Alcohol Use: No     Comment: very rare    Review of Systems  Unable to perform ROS: Dementia      Allergies  Fluvirin  Home Medications   Prior to Admission medications   Medication Sig Start Date End Date Taking? Authorizing Provider  amLODipine (NORVASC) 5 MG tablet Take 1 tablet (5 mg total) by mouth daily. 09/17/15  Yes Meredeth IdeGagan S Lama, MD  cholecalciferol (VITAMIN D) 1000 UNITS tablet Take 1,000 Units by mouth daily.    Yes Historical Provider, MD  ferrous sulfate 325 (65 FE) MG tablet Take 325 mg by mouth daily with breakfast.   Yes Historical Provider, MD  FLUoxetine (PROZAC) 20 MG capsule Take 20 mg by mouth daily.   Yes Historical Provider, MD  furosemide (LASIX) 20 MG tablet Take 1 tablet (20 mg total) by mouth daily. 09/17/15  Yes Meredeth IdeGagan S Lama, MD  HUMALOG 100 UNIT/ML injection Inject 6-9 Units into the skin 4 (four) times  daily -  with meals and at bedtime. 6 units with breakfast 9 units before lunch and 9 units before supper. If BS<200 = 0 units 200-249=1 unit >250=2 units as needed 03/26/15  Yes Historical Provider, MD  insulin glargine (LANTUS) 100 UNIT/ML injection Inject 18 Units into the skin 2 (two) times daily.    Yes Historical Provider, MD  lisinopril (PRINIVIL,ZESTRIL) 10 MG tablet Take 1 tablet (10 mg total) by mouth daily. 09/17/15  Yes Meredeth Ide, MD   BP 125/65 mmHg  Pulse 88  Temp(Src) 97.4 F (36.3 C) (Oral)  Resp 13  Ht 6' (1.829 m)  Wt 170 lb (77.111 kg)  BMI 23.05 kg/m2  SpO2 97% Physical Exam  Constitutional:  He appears well-developed. No distress.  Sleepy elderly male who awakens briefly, answers questions simply, directly, with no elaboration  HENT:  Head: Normocephalic and atraumatic.  Eyes: Conjunctivae and EOM are normal.  Cardiovascular: Normal rate and regular rhythm.   Pulmonary/Chest: Effort normal. No stridor. No respiratory distress.  Abdominal: He exhibits no distension.  Musculoskeletal: He exhibits no edema.  Neurological:  Unable to complete the exam, as the patient is minimally interactive, does not follow commands reliably.  Skin: Skin is warm and dry.  Psychiatric: He is withdrawn. Cognition and memory are impaired.  Minimally interactive  Nursing note and vitals reviewed.   ED Course  Procedures (including critical care time) Labs Review Labs Reviewed  COMPREHENSIVE METABOLIC PANEL - Abnormal; Notable for the following:    Glucose, Bld 137 (*)    BUN 27 (*)    GFR calc non Af Amer 56 (*)    All other components within normal limits  CBC - Abnormal; Notable for the following:    MCV 102.5 (*)    All other components within normal limits  URINALYSIS, ROUTINE W REFLEX MICROSCOPIC (NOT AT [facility]) - Abnormal; Notable for the following:    Glucose, UA 250 (*)    All other components within normal limits  CBG MONITORING, ED  I-STAT CG4 LACTIC ACID, ED    Imaging Review Dg Chest 2 View  02/21/2016  CLINICAL DATA:  Altered mental status, myalgias, urinary frequency EXAM: CHEST  2 VIEW COMPARISON:  02/02/2016, 09/15/2015 FINDINGS: Persistent low lung volumes with minor basilar atelectasis. No current pneumonia, collapse or consolidation. No edema, effusion or pneumothorax. Trachea is midline. Degenerative changes of the thoracic spine. Aorta is atherosclerotic and tortuous. IMPRESSION: Low volume exam with basilar atelectasis. No superimposed acute process. Electronically Signed   By: Judie Petit.  Shick M.D.   On: 02/21/2016 08:55   Ct Head Wo Contrast  02/21/2016  CLINICAL DATA:   Altered mental status with weakness EXAM: CT HEAD WITHOUT CONTRAST TECHNIQUE: Contiguous axial images were obtained from the base of the skull through the vertex without intravenous contrast. COMPARISON:  07/23/2014 FINDINGS: There is no evidence of mass effect, midline shift, or extra-axial fluid collections. There is no evidence of a space-occupying lesion or intracranial hemorrhage. There is no evidence of a cortical-based area of acute infarction. There is generalized cerebral atrophy. There is periventricular white matter low attenuation likely secondary to microangiopathy. The ventricles and sulci are appropriate for the patient's age. The basal cisterns are patent. Visualized portions of the orbits are unremarkable. The visualized portions of the paranasal sinuses and mastoid air cells are unremarkable. Cerebrovascular atherosclerotic calcifications are noted. The osseous structures are unremarkable. IMPRESSION: 1. No acute intracranial pathology. 2. Chronic microvascular disease and cerebral atrophy. Electronically Signed   By:  Elige Ko   On: 02/21/2016 08:46   I have personally reviewed and evaluated these images and lab results as part of my medical decision-making.   EKG Interpretation   Date/Time:  Friday February 21 2016 07:57:49 EDT Ventricular Rate:  53 PR Interval:  145 QRS Duration: 102 QT Interval:  424 QTC Calculation: 398 R Axis:   17 Text Interpretation:  Sinus rhythm Multiple premature complexes, vent &  supraven Borderline low voltage, extremity leads Borderline repolarization  abnormality Sinus rhythm Premature ventricular complexes Artifact Abnormal  ekg Confirmed by Gerhard Munch  MD ([id number]) on 02/21/2016 8:05:20 AM     11:23 AM Patient substantially more awake, answering questions appropriately. He denies pain. 2 daughters are in the room, we discussed all findings, including largely reassuring CT scan, labs, urinalysis. Patient is afebrile, with remarkable vital  signs. We discussed possibilities, including transient ischemic attack, sleep apnea, progression of his memory loss, need for close outpatient follow-up with primary care and return precautions. MDM  Elderly male with dementia presents from nursing facility due to staff concerns of decreased interactivity. Patient's response position for encephalopathy. Here, the patient is initially somnolent, but awakens states that with fluid resuscitation. Patient remains hemodynamically stable throughout, has reassuring labs, CT scan, and there is low suspicion for occult infection, or other acute new pathology. Patient discharged to his monitored facility.  Gerhard Munch, MD 02/21/16 1124

## 2016-02-21 NOTE — ED Notes (Signed)
BLOOD CULTURE X 1 OBTAINED 

## 2016-02-21 NOTE — ED Notes (Signed)
Per [facility]- Pt resides at [facility]. FULL CODE. Per staff pt is normally alert and active with care. Pt responsive to verbal. Orientation with stimulation. Pt c/o of generalized body aches. Pt is normally incontinent however increased in last 24 hours. Denies N/V/D and fever

## 2016-03-05 ENCOUNTER — Encounter: Payer: Self-pay | Admitting: Endocrinology

## 2016-03-05 ENCOUNTER — Ambulatory Visit (INDEPENDENT_AMBULATORY_CARE_PROVIDER_SITE_OTHER): Payer: Medicare Other | Admitting: Endocrinology

## 2016-03-05 VITALS — BP 128/72 | HR 58 | Temp 98.4°F | Resp 14 | Ht 72.0 in

## 2016-03-05 DIAGNOSIS — E1151 Type 2 diabetes mellitus with diabetic peripheral angiopathy without gangrene: Secondary | ICD-10-CM | POA: Diagnosis not present

## 2016-03-05 DIAGNOSIS — Z794 Long term (current) use of insulin: Secondary | ICD-10-CM

## 2016-03-05 DIAGNOSIS — E0840 Diabetes mellitus due to underlying condition with diabetic neuropathy, unspecified: Secondary | ICD-10-CM

## 2016-03-05 DIAGNOSIS — E1165 Type 2 diabetes mellitus with hyperglycemia: Secondary | ICD-10-CM

## 2016-03-05 LAB — POCT GLYCOSYLATED HEMOGLOBIN (HGB A1C): HEMOGLOBIN A1C: 7.9

## 2016-03-05 NOTE — Progress Notes (Signed)
Patient ID: Javier Murillo, male   DOB: Jul 02, 1930, 82 y.o.   MRN: [id number]           Reason for Appointment: Follow-up for Type 2 Diabetes  Referring physician: Zachery Dauer  History of Present Illness:          Diagnosis: Type 2 diabetes mellitus, date of diagnosis:1975        Past history: He may have been treated with oral agents for the first few years of his diabetes management He thinks he has been on insulin for several years, uncertain how long. For some reason he has been continued on glyburide also which she probably has been taking for a long time Not clear if he had taken metformin or why it was stopped  He has had fair control of his diabetes over the last 2-3 years with A1c ranging from 7.4-9.3, relatively higher since 10/2013  On his initial consultation he had been on a fixed dose of Lantus along with sliding scale Humalog based on blood sugar levels before meals Because of persistently high A1c of 9% or more since 06/2014 he seen in consultation and he was started on pre-meal insulin along with correction doses His glyburide stopped because of his age and needing basal bolus insulin   Recent history:  INSULIN regimen is described as:   Lantus 18 units twice a day  Humalog 9 units at lunch and supper and 6 units with breakfast Correction doses for high readings: If blood sugar below 200: No insulin  Blood sugar 200-249 = 1 unit 250 and above = 2 units  Currently taking basal bolus insulin regimen as monotherapy for diabetes Since his last visit because of higher readings in the afternoons and daytime his Lantus was changed from bedtime only to twice a day His A1c is now slightly better at 7.9  Current blood sugar patterns and problems identified:  FASTING blood sugars are mildly increased overall but only once over 200  LUNCHTIME blood sugars are relatively higher but over 200 only on a few days recently  Suppertime readings are also variable with only a few  readings over 200  Blood sugars at night after supper are mostly high but occasionally has good readings also  His diet is inconsistent based on what he gets at the nursing home    His insulin doses have been periodically adjusted based on his blood sugars sent by the fax from the nursing home.  No hypoglycemia  Blood sugars from the last 10 days:  Mean values apply above for all meters except median for One Touch  PRE-MEAL Fasting Lunch Dinner Bedtime Overall  Glucose range: 124-215  135-347   156-277  89-456   Mean/median:         Diet: He is generally ordering whatever he wants on his menu at the nursing home and he usually finishes his meal, Usually avoiding sweets Hypoglycemia: None          Glucose monitoring:  done 2-3 times a day Glucometer: Unknown       Blood Glucose readings by time of day and averages from recent record of nursing home As above   Self-care:     Meals: 3 meals per day. Breakfast is eggs, toast, sausage.  Lunch and dinner usually a meat or fish along with vegetables, bread and sugar-free ice cream.  Usually has sugar-free snacks           Physical activity:  none, in wheelchair  Dietician visit, most recent: Never .               Weight history:  Wt Readings from Last 3 Encounters:  02/21/16 170 lb (77.111 kg)  12/05/15 170 lb (77.111 kg)  09/19/15 173 lb (78.472 kg)    Glycemic control:   Lab Results  Component Value Date   HGBA1C 7.9 03/05/2016   HGBA1C 8.3 12/05/2015   HGBA1C 8.5 07/08/2015   Lab Results  Component Value Date   LDLCALC 80 12/05/2015   CREATININE 1.17 02/21/2016    Urine microalbumin/creatinine ratio 50.4 done on 10/27/13     Medication List       This list is accurate as of: 03/05/16 11:59 PM.  Always use your most recent med list.               acetaminophen 500 MG tablet  Commonly known as:  TYLENOL  Take 1 tablet (500 mg total) by mouth every 6 (six) hours as needed for moderate pain.      amLODipine 5 MG tablet  Commonly known as:  NORVASC  Take 1 tablet (5 mg total) by mouth daily.     cholecalciferol 1000 units tablet  Commonly known as:  VITAMIN D  Take 1,000 Units by mouth daily.     ferrous sulfate 325 (65 FE) MG tablet  Take 325 mg by mouth daily with breakfast.     FLUoxetine 20 MG capsule  Commonly known as:  PROZAC  Take 20 mg by mouth daily.     furosemide 20 MG tablet  Commonly known as:  LASIX  Take 1 tablet (20 mg total) by mouth daily.     HUMALOG 100 UNIT/ML injection  Generic drug:  insulin lispro  Inject 6-9 Units into the skin 4 (four) times daily -  with meals and at bedtime. 6 units with breakfast 9 units before lunch and 9 units before supper. If BS<200 = 0 units 200-249=1 unit >250=2 units as needed     insulin glargine 100 UNIT/ML injection  Commonly known as:  LANTUS  Inject 18 Units into the skin 2 (two) times daily.     lisinopril 10 MG tablet  Commonly known as:  PRINIVIL,ZESTRIL  Take 1 tablet (10 mg total) by mouth daily.        Allergies:  Allergies  Allergen Reactions  . Fluvirin [Influenza Virus Vaccine Split] Other (See Comments)    Gave patient flu    Past Medical History  Diagnosis Date  . Quadriplegia and quadriparesis (HCC) 08/20/2014  . Diabetic peripheral neuropathy (HCC)   . Gait disorder   . Peripheral vascular disease (HCC)   . Degenerative arthritis   . Lumbosacral spondylosis   . Depression   . Chronic low back pain   . Kidney stones     only once  . Hypertension   . Hyperlipidemia   . Arthritis   . Diabetes mellitus without complication [facility])     Past Surgical History  Procedure Laterality Date  . Shoulder surgery Bilateral   . Cataract extraction w/ intraocular lens  implant, bilateral    . Bursitis Bilateral     olecranon I&D  . Lumbar spine surgery      x7  . Back surgery      x 7  . Eye surgery      bilateral cataracts  . Bone spur removed  right sholder    . Appendectomy    .  Anterior cervical decomp/discectomy fusion  N/A 08/22/2014    Procedure: ANTERIOR CERVICAL DECOMPRESSION/DISCECTOMY FUSION, cervical three-four;  Surgeon: Hewitt Shortsobert W Nudelman, MD;  Location: [facility];  Service: Neurosurgery;  Laterality: N/A;  C3-4 anterior cervical decompression with fusion plating and bonegraft    Family History  Problem Relation Age of Onset  . Cancer Mother   . Heart attack Father   . Dementia Sister     Social History:  reports that he has quit smoking. His smoking use included Cigarettes. He has a 10 pack-year smoking history. He does not have any smokeless tobacco history on file. He reports that he does not drink alcohol or use illicit drugs.    Review of Systems   He has been hospitalized periodically for various issues      Lipids: not on any medications for hyperlipidemia       Lab Results  Component Value Date   CHOL 164 12/05/2015   HDL 54.90 12/05/2015   LDLCALC 80 12/05/2015   TRIG 146.0 12/05/2015   CHOLHDL 3 12/05/2015               He does have weakness in his legs from central cord problems previously and not able to ambulate Also some numbness in fingers   Physical Examination:  BP 128/72 mmHg  Pulse 58  Temp(Src) 98.4 F (36.9 C)  Resp 14  Ht 6' (1.829 m)  Wt   SpO2 97%  GENERAL: He is alert and conversing well Mild left lower leg edema present Diabetic Foot Exam - Simple   Simple Foot Form  Diabetic Foot exam was performed with the following findings:  Yes   Visual Inspection  See comments:  Yes  Sensation Testing  See comments:  Yes  Pulse Check  See comments:  Yes  Comments  Somewhat thickened mycotic toenails 2+ left lower leg and pedal edema present Pedal pulses difficult to feel bilaterally Appears to have diffuse significantly decreased monofilament sensation bilaterally in the toes       ASSESSMENT:  Diabetes type 2, uncontrolled  See history of present illness for detailed discussion of his current  management, blood sugar patterns and problems identified He is on a basal bolus insulin regimen with Lantus twice a day none Blood sugars are quite variable probably based on his diet as well as variability of his Lantus insulin, timing of mealtime insulin being given an inherent variability Most of his high readings are after supper but these are quite inconsistent Also mostly has high readings at lunch and dinner A1c is reasonable at 7.9   PLAN:   Increase morning Lantus by 2 units to cover daytime hyperglycemia, will not adjust his Novolog as it because of variability in his diet and to avoid potential hypoglycemia especially after supper  Will review blood sugars by fax periodically again Discussed blood sugar targets and insulin timing as well as diet  Written instructions given including to elevate his leg when setting  Discussed foot care and diabetic neuropathy   Patient Instructions  Increase am Lantus only to 20 units      Counseling time on subjects discussed above is over 50% of today's 25 minute visit   Shirleen Mcfaul 03/06/2016, 7:43 AM   Note: This office note was prepared with Dragon voice recognition system technology. Any transcriptional errors that result from this process are unintentional.

## 2016-03-05 NOTE — Patient Instructions (Signed)
Increase am Lantus only to 20 units

## 2016-03-09 ENCOUNTER — Encounter: Payer: Self-pay | Admitting: *Deleted

## 2016-03-09 LAB — HM DIABETES EYE EXAM

## 2016-06-04 ENCOUNTER — Ambulatory Visit (INDEPENDENT_AMBULATORY_CARE_PROVIDER_SITE_OTHER): Payer: Medicare Other | Admitting: Endocrinology

## 2016-06-04 ENCOUNTER — Encounter: Payer: Self-pay | Admitting: Endocrinology

## 2016-06-04 VITALS — BP 118/60 | HR 61 | Ht 72.0 in | Wt 168.2 lb

## 2016-06-04 DIAGNOSIS — Z794 Long term (current) use of insulin: Secondary | ICD-10-CM

## 2016-06-04 DIAGNOSIS — E1165 Type 2 diabetes mellitus with hyperglycemia: Secondary | ICD-10-CM

## 2016-06-04 LAB — BASIC METABOLIC PANEL
BUN: 30 mg/dL — AB (ref 6–23)
CHLORIDE: 101 meq/L (ref 96–112)
CO2: 30 mEq/L (ref 19–32)
CREATININE: 1.25 mg/dL (ref 0.40–1.50)
Calcium: 9.6 mg/dL (ref 8.4–10.5)
GFR: 58.48 mL/min — ABNORMAL LOW (ref >60.00)
GLUCOSE: 307 mg/dL — AB (ref 70–99)
POTASSIUM: 4.5 meq/L (ref 3.5–5.1)
Sodium: 136 mEq/L (ref 135–145)

## 2016-06-04 LAB — URINALYSIS, ROUTINE W REFLEX MICROSCOPIC
BILIRUBIN URINE: NEGATIVE
HGB URINE DIPSTICK: NEGATIVE
KETONES UR: NEGATIVE
Leukocytes, UA: NEGATIVE
NITRITE: NEGATIVE
Specific Gravity, Urine: 1.015 (ref 1.000–1.030)
TOTAL PROTEIN, URINE-UPE24: NEGATIVE
UROBILINOGEN UA: 0.2 (ref 0.0–1.0)
pH: 5.5 (ref 5.0–8.0)

## 2016-06-04 LAB — MICROALBUMIN / CREATININE URINE RATIO
Creatinine,U: 68.4 mg/dL
Microalb Creat Ratio: 2.3 mg/g (ref 0.0–30.0)
Microalb, Ur: 1.6 mg/dL (ref 0.0–1.9)

## 2016-06-04 LAB — POCT GLYCOSYLATED HEMOGLOBIN (HGB A1C): Hemoglobin A1C: 8.3

## 2016-06-04 NOTE — Progress Notes (Signed)
Pre visit review using our clinic review tool, if applicable. No additional management support is needed unless otherwise documented below in the visit note. 

## 2016-06-04 NOTE — Progress Notes (Signed)
Patient ID: Javier Murillo, male   DOB: [birth year], 82 y.o.   MRN: [id number]           Reason for Appointment: Follow-up for Type 2 Diabetes  Referring physician: [name]  History of Present Illness:          Diagnosis: Type 2 diabetes mellitus, date of diagnosis:1975        Past history: He may have been treated with oral agents for the first few years of his diabetes management He thinks he has been on insulin for several years, uncertain how long. For some reason he has been continued on glyburide also which she probably has been taking for a long time Not clear if he had taken metformin or why it was stopped  He has had fair control of his diabetes over the last 2-3 years with A1c ranging from 7.4-9.3, relatively higher since 10/2013  On his initial consultation he had been on a fixed dose of Lantus along with sliding scale Humalog based on blood sugar levels before meals Because of persistently high A1c of 9% or more since 06/2014 he seen in consultation and he was started on pre-meal insulin along with correction doses His glyburide stopped because of his age and needing basal bolus insulin   Recent history:  INSULIN regimen is described as:   Lantus 20 units once a day  Humalog 9 units at lunch and supper and 6 units with breakfast Correction doses for high readings: If blood sugar below 200: No insulin  Blood sugar 200-249 = 1 unit 250 and above = 2 units  Currently taking basal bolus insulin regimen as monotherapy for diabetes Since his last visit because of higher readings in the afternoons and daytime Previously his Lantus was changed from bedtime only to twice a day However even though he was supposed to take 20 units in the morning and 18 in the evening his medication record indicates only the morning dose of Lantus  His A1c is now slightly higher at 8.3  Current blood sugar patterns and problems identified:  FASTING blood sugars are generally fairly good with only  occasional high readings  Blood sugars are also relatively better at lunchtime mildly increased overall but only once over 200  His diet is inconsistent based on what he gets at the nursing home and he makes his own choices, not always prudent  Highest blood sugar was before dinner  His nursing home has done some readings after supper which are usually fairly good   Glucose monitoring:  done 3+ times a day Glucometer: Unknown        Blood Glucose readings by time of day and averages from recent record of nursing home  Mean values apply above for all meters except median for One Touch  PRE-MEAL Fasting Lunch Dinner Bedtime Overall  Glucose range: 89-241  101-294  129-430  125-263    Mean/median:         Diet: He is generally ordering whatever he wants on his menu at the nursing home and he usually finishes his meal, Usually avoiding sweets Hypoglycemia: None         Self-care:     Meals: 3 meals per day. Breakfast is eggs, toast, sausage.  Lunch and dinner usually a meat or fish along with vegetables, bread and sugar-free ice cream.  Usually has sugar-free snacks           Physical activity:  none, in wheelchair  Dietician visit, most recent: Never .               Weight history:  Wt Readings from Last 3 Encounters:  06/04/16 168 lb 3.2 oz (76.295 kg)  02/21/16 170 lb (77.111 kg)  12/05/15 170 lb (77.111 kg)    Glycemic control:   Lab Results  Component Value Date   HGBA1C 8.3 06/04/2016   HGBA1C 7.9 03/05/2016   HGBA1C 8.3 12/05/2015   Lab Results  Component Value Date   MICROALBUR 1.6 06/04/2016   LDLCALC 80 12/05/2015   CREATININE 1.25 06/04/2016    Urine microalbumin/creatinine ratio 50.4 done on 10/27/13     Medication List       This list is accurate as of: 06/04/16  9:23 PM.  Always use your most recent med list.               acetaminophen 500 MG tablet  Commonly known as:  TYLENOL  Take 1 tablet (500 mg total) by mouth every 6 (six)  hours as needed for moderate pain.     amLODipine 5 MG tablet  Commonly known as:  NORVASC  Take 1 tablet (5 mg total) by mouth daily.     cholecalciferol 1000 units tablet  Commonly known as:  VITAMIN D  Take 1,000 Units by mouth daily.     ferrous sulfate 325 (65 FE) MG tablet  Take 325 mg by mouth daily with breakfast.     FLUoxetine 20 MG capsule  Commonly known as:  PROZAC  Take 20 mg by mouth daily.     furosemide 20 MG tablet  Commonly known as:  LASIX  Take 1 tablet (20 mg total) by mouth daily.     HUMALOG 100 UNIT/ML injection  Generic drug:  insulin lispro  Inject 6-9 Units into the skin 4 (four) times daily -  with meals and at bedtime. 6 units with breakfast 9 units before lunch and 9 units before supper. If BS<200 = 0 units 200-249=1 unit >250=2 units as needed     insulin glargine 100 UNIT/ML injection  Commonly known as:  LANTUS  Inject 18 Units into the skin 2 (two) times daily.     lisinopril 10 MG tablet  Commonly known as:  PRINIVIL,ZESTRIL  Take 1 tablet (10 mg total) by mouth daily.        Allergies:  Allergies  Allergen Reactions  . Fluvirin [Influenza Virus Vaccine Split] Other (See Comments)    Gave patient flu    Past Medical History  Diagnosis Date  . Quadriplegia and quadriparesis (HCC) 08/20/2014  . Diabetic peripheral neuropathy (HCC)   . Gait disorder   . Peripheral vascular disease (HCC)   . Degenerative arthritis   . Lumbosacral spondylosis   . Depression   . Chronic low back pain   . Kidney stones     only once  . Hypertension   . Hyperlipidemia   . Arthritis   . Diabetes mellitus without complication [facility](HCC)     Past Surgical History  Procedure Laterality Date  . Shoulder surgery Bilateral   . Cataract extraction w/ intraocular lens  implant, bilateral    . Bursitis Bilateral     olecranon I&D  . Lumbar spine surgery      x7  . Back surgery      x 7  . Eye surgery      bilateral cataracts  . Bone spur removed   right sholder    . Appendectomy    .  Anterior cervical decomp/discectomy fusion N/A 08/22/2014    Procedure: ANTERIOR CERVICAL DECOMPRESSION/DISCECTOMY FUSION, cervical three-four;  Surgeon: Hewitt Shorts, MD;  Location: [facility];  Service: Neurosurgery;  Laterality: N/A;  C3-4 anterior cervical decompression with fusion plating and bonegraft    Family History  Problem Relation Age of Onset  . Cancer Mother   . Heart attack Father   . Dementia Sister     Social History:  reports that he has quit smoking. His smoking use included Cigarettes. He has a 10 pack-year smoking history. He does not have any smokeless tobacco history on file. He reports that he does not drink alcohol or use illicit drugs.    Review of Systems   He has been hospitalized periodically for various issues      Lipids: not on any medications for hyperlipidemia       Lab Results  Component Value Date   CHOL 164 12/05/2015   HDL 54.90 12/05/2015   LDLCALC 80 12/05/2015   TRIG 146.0 12/05/2015   CHOLHDL 3 12/05/2015               He does have weakness in his legs from central cord problems previously and not able to ambulate Also some numbness in fingers  Also has tendency to leg edema especially on the left  Physical Examination:  BP 118/60 mmHg  Pulse 61  Ht 6' (1.829 m)  Wt 168 lb 3.2 oz (76.295 kg)  BMI 22.81 kg/m2  SpO2 97%  1+ left lower leg edema  ASSESSMENT:  Diabetes type 2, uncontrolled  See history of present illness for detailed discussion of his current management, blood sugar patterns and problems identified He is on a basal bolus insulin regimen with Lantus apparently now only once a day even though he was previously taking twice a day and unable to get confirmation from nursing home  evening dose is being to given He has some variability in blood sugars based on his diet but overall no consistent pattern of hyperglycemia Has some normal readings at all different times A1c  is reasonable at 8.3   PLAN:   No change in insulin His son was checked to see if the order for the evening Lantus was dropped by nursing home physician. He will also try to see if his dad's meals can be supervised better   There are no Patient Instructions on file for this visit.       Javier Murillo 06/04/2016, 9:23 PM   Note: This office note was prepared with Dragon voice recognition system technology. Any transcriptional errors that result from this process are unintentional. Sleep

## 2016-08-08 ENCOUNTER — Emergency Department (HOSPITAL_COMMUNITY)
Admission: EM | Admit: 2016-08-08 | Discharge: 2016-08-08 | Disposition: A | Discharge status: Home / Self Care | Payer: Medicare Other | Attending: Emergency Medicine | Admitting: Emergency Medicine

## 2016-08-08 ENCOUNTER — Emergency Department (HOSPITAL_COMMUNITY): Payer: Medicare Other

## 2016-08-08 ENCOUNTER — Encounter (HOSPITAL_COMMUNITY): Payer: Self-pay | Admitting: Emergency Medicine

## 2016-08-08 DIAGNOSIS — W050XXA Fall from non-moving wheelchair, initial encounter: Secondary | ICD-10-CM | POA: Insufficient documentation

## 2016-08-08 DIAGNOSIS — Y999 Unspecified external cause status: Secondary | ICD-10-CM | POA: Diagnosis not present

## 2016-08-08 DIAGNOSIS — Y939 Activity, unspecified: Secondary | ICD-10-CM | POA: Diagnosis not present

## 2016-08-08 DIAGNOSIS — I10 Essential (primary) hypertension: Secondary | ICD-10-CM | POA: Insufficient documentation

## 2016-08-08 DIAGNOSIS — F0390 Unspecified dementia without behavioral disturbance: Secondary | ICD-10-CM | POA: Diagnosis not present

## 2016-08-08 DIAGNOSIS — E119 Type 2 diabetes mellitus without complications: Secondary | ICD-10-CM | POA: Insufficient documentation

## 2016-08-08 DIAGNOSIS — Z87891 Personal history of nicotine dependence: Secondary | ICD-10-CM | POA: Diagnosis not present

## 2016-08-08 DIAGNOSIS — Z79899 Other long term (current) drug therapy: Secondary | ICD-10-CM | POA: Insufficient documentation

## 2016-08-08 DIAGNOSIS — S0990XA Unspecified injury of head, initial encounter: Secondary | ICD-10-CM | POA: Diagnosis present

## 2016-08-08 DIAGNOSIS — Y92129 Unspecified place in nursing home as the place of occurrence of the external cause: Secondary | ICD-10-CM | POA: Insufficient documentation

## 2016-08-08 DIAGNOSIS — W19XXXA Unspecified fall, initial encounter: Secondary | ICD-10-CM

## 2016-08-08 DIAGNOSIS — S0081XA Abrasion of other part of head, initial encounter: Secondary | ICD-10-CM

## 2016-08-08 DIAGNOSIS — Z794 Long term (current) use of insulin: Secondary | ICD-10-CM | POA: Diagnosis not present

## 2016-08-08 NOTE — ED Triage Notes (Signed)
Pt from [facility]. Pt fell out of his wheelchair in an unwitnessed fall. Pt was found on the floor. Pt has hx of dementia and does not remember the fall. Per staff he is at his baseline. Pt has some abrasions on his face but is mostly complaining of lower back pain. Pt does not take blood thinners

## 2016-08-08 NOTE — ED Provider Notes (Signed)
[facility]-EMERGENCY DEPT Provider Note   CSN: [id number] Arrival date & time: 08/08/16  1613     History   Chief Complaint Chief Complaint  Patient presents with  . Fall    HPI Javier Murillo is a 82 y.o. male.  HPI Patient with history of dementia presents from nursing home for unwitnessed fall from nursing home. Level V caveat applies. Sustained facial abrasions. Does not remember fall. No current complaints. Pt family in room. State he is at his baseline currently.    Past Medical History:  Diagnosis Date  . Arthritis   . Chronic low back pain   . Degenerative arthritis   . Depression   . Diabetes mellitus without complication (HCC)   . Diabetic peripheral neuropathy (HCC)   . Gait disorder   . Hyperlipidemia   . Hypertension   . Kidney stones    only once  . Lumbosacral spondylosis   . Peripheral vascular disease (HCC)   . Quadriplegia and quadriparesis (HCC) 08/20/2014    Patient Active Problem List   Diagnosis Date Noted  . Acute encephalopathy 09/15/2015  . Acute respiratory failure with hypoxia (HCC) 09/15/2015  . Bradycardia 09/15/2015  . Diabetes mellitus with neuropathy (HCC) 09/15/2015  . Anemia 01/30/2015  . Quadriplegia and quadriparesis (HCC) 08/20/2014  . Gait disorder 08/20/2014  . Weakness 07/23/2014  . Diabetic neuropathy (HCC) 07/23/2014  . Acute renal failure (HCC) 07/23/2014  . HTN (hypertension) 07/23/2014  . Dyslipidemia 07/23/2014    Past Surgical History:  Procedure Laterality Date  . ANTERIOR CERVICAL DECOMP/DISCECTOMY FUSION N/A 08/22/2014   Procedure: ANTERIOR CERVICAL DECOMPRESSION/DISCECTOMY FUSION, cervical three-four;  Surgeon: Hewitt Shorts, MD;  Location: [facility];  Service: Neurosurgery;  Laterality: N/A;  C3-4 anterior cervical decompression with fusion plating and bonegraft  . APPENDECTOMY    . BACK SURGERY     x 7  . bone spur removed  right sholder    . bursitis Bilateral    olecranon I&D  . CATARACT  EXTRACTION W/ INTRAOCULAR LENS  IMPLANT, BILATERAL    . EYE SURGERY     bilateral cataracts  . LUMBAR SPINE SURGERY     x7  . SHOULDER SURGERY Bilateral        Home Medications    Prior to Admission medications   Medication Sig Start Date End Date Taking? Authorizing Provider  acetaminophen (TYLENOL) 500 MG tablet Take 1 tablet (500 mg total) by mouth every 6 (six) hours as needed for moderate pain. 02/21/16  Yes Gerhard Munch, MD  amLODipine (NORVASC) 5 MG tablet Take 1 tablet (5 mg total) by mouth daily. 09/17/15  Yes Meredeth Ide, MD  cholecalciferol (VITAMIN D) 1000 UNITS tablet Take 1,000 Units by mouth every evening.    Yes Historical Provider, MD  ferrous sulfate 325 (65 FE) MG tablet Take 325 mg by mouth daily with breakfast.   Yes Historical Provider, MD  FLUoxetine (PROZAC) 20 MG capsule Take 20 mg by mouth daily.   Yes Historical Provider, MD  furosemide (LASIX) 20 MG tablet Take 1 tablet (20 mg total) by mouth daily. 09/17/15  Yes Meredeth Ide, MD  HUMALOG 100 UNIT/ML injection Inject 6-9 Units into the skin 4 (four) times daily -  with meals and at bedtime. 6 units with breakfast 9 units before lunch and 9 units before supper. If BS<200 = 0 units 200-249=1 unit >250=2 units as needed 03/26/15  Yes Historical Provider, MD  insulin glargine (LANTUS) 100 UNIT/ML injection Inject 20  Units into the skin daily.    Yes Historical Provider, MD  lisinopril (PRINIVIL,ZESTRIL) 10 MG tablet Take 1 tablet (10 mg total) by mouth daily. Patient taking differently: Take 10 mg by mouth every evening.  09/17/15  Yes Meredeth Ide, MD    Family History Family History  Problem Relation Age of Onset  . Cancer Mother   . Heart attack Father   . Dementia Sister     Social History Social History  Substance Use Topics  . Smoking status: Former Smoker    Packs/day: 1.00    Years: 10.00    Types: Cigarettes  . Smokeless tobacco: Never Used  . Alcohol use No     Comment: very rare      Allergies   Fluvirin [influenza virus vaccine split]   Review of Systems Review of Systems  Unable to perform ROS: Dementia     Physical Exam Updated Vital Signs BP 150/83   Pulse 79   Temp 98.3 F (36.8 C) (Oral)   Resp 18   SpO2 97%   Physical Exam  Constitutional: He appears well-developed and well-nourished.  HENT:  Head: Normocephalic.  Mouth/Throat: Oropharynx is clear and moist.  Abrasion to the forehead and his nose. No facial swelling. Midface is stable. No hemotympanum bilaterally.  Eyes: EOM are normal. Pupils are equal, round, and reactive to light.  Neck: Normal range of motion. Neck supple.  Mild diffuse posterior midline cervical tenderness.  Cardiovascular: Normal rate and regular rhythm.   Pulmonary/Chest: Effort normal and breath sounds normal.  Abdominal: Soft. Bowel sounds are normal. There is no tenderness. There is no rebound and no guarding.  Musculoskeletal: Normal range of motion. He exhibits edema. He exhibits no tenderness.  1+ bilateral lower extremity edema. No focal lumbar tenderness to palpation. Distal pulses intact.  Neurological: He is alert.  Oriented to person. 5/5 motor in all extremities. Sensation intact.  Skin: Skin is warm and dry. Capillary refill takes less than 2 seconds. No rash noted. No erythema.  Psychiatric: He has a normal mood and affect. His behavior is normal.  Nursing note and vitals reviewed.    ED Treatments / Results  Labs (all labs ordered are listed, but only abnormal results are displayed) Labs Reviewed - No data to display  EKG  EKG Interpretation None       Radiology Ct Head Wo Contrast  Result Date: 08/08/2016 CLINICAL DATA:  Unwitnessed fall, found down, dementia. EXAM: CT HEAD WITHOUT CONTRAST CT CERVICAL SPINE WITHOUT CONTRAST TECHNIQUE: Multidetector CT imaging of the head and cervical spine was performed following the standard protocol without intravenous contrast. Multiplanar CT image  reconstructions of the cervical spine were also generated. COMPARISON:  CT head dated 02/21/2016. CT head/ cervical spine dated 07/23/2014. FINDINGS: CT HEAD FINDINGS No evidence of parenchymal hemorrhage or extra-axial fluid collection. No mass lesion, mass effect, or midline shift. No CT evidence of acute infarction. Subcortical white matter and periventricular small vessel ischemic changes. Intracranial atherosclerosis. Global cortical atrophy.  Secondary mild ventricular prominence. The visualized paranasal sinuses are essentially clear. The mastoid air cells are unopacified. No evidence of calvarial fracture. CT CERVICAL SPINE FINDINGS Reversal of the normal mid cervical lordosis. Status post C3-4 ACDF, without evidence of hardware complication. No evidence of fracture or dislocation. Vertebral body heights are maintained. Dens appears intact. No prevertebral soft tissue swelling. Mild to moderate multilevel degenerative changes. Visualized thyroid is unremarkable. Visualized lung apices are clear. IMPRESSION: No evidence of acute intracranial abnormality.  Atrophy with small vessel ischemic changes. No evidence of traumatic injury to the cervical spine. Status post C3-4 ACDF, without evidence of complication. Mild to moderate degenerative changes. Electronically Signed   By: Charline BillsSriyesh  Krishnan M.D.   On: 08/08/2016 17:49   Ct Cervical Spine Wo Contrast  Result Date: 08/08/2016 CLINICAL DATA:  Unwitnessed fall, found down, dementia. EXAM: CT HEAD WITHOUT CONTRAST CT CERVICAL SPINE WITHOUT CONTRAST TECHNIQUE: Multidetector CT imaging of the head and cervical spine was performed following the standard protocol without intravenous contrast. Multiplanar CT image reconstructions of the cervical spine were also generated. COMPARISON:  CT head dated 02/21/2016. CT head/ cervical spine dated 07/23/2014. FINDINGS: CT HEAD FINDINGS No evidence of parenchymal hemorrhage or extra-axial fluid collection. No mass lesion,  mass effect, or midline shift. No CT evidence of acute infarction. Subcortical white matter and periventricular small vessel ischemic changes. Intracranial atherosclerosis. Global cortical atrophy.  Secondary mild ventricular prominence. The visualized paranasal sinuses are essentially clear. The mastoid air cells are unopacified. No evidence of calvarial fracture. CT CERVICAL SPINE FINDINGS Reversal of the normal mid cervical lordosis. Status post C3-4 ACDF, without evidence of hardware complication. No evidence of fracture or dislocation. Vertebral body heights are maintained. Dens appears intact. No prevertebral soft tissue swelling. Mild to moderate multilevel degenerative changes. Visualized thyroid is unremarkable. Visualized lung apices are clear. IMPRESSION: No evidence of acute intracranial abnormality. Atrophy with small vessel ischemic changes. No evidence of traumatic injury to the cervical spine. Status post C3-4 ACDF, without evidence of complication. Mild to moderate degenerative changes. Electronically Signed   By: Charline BillsSriyesh  Krishnan M.D.   On: 08/08/2016 17:49    Procedures Procedures (including critical care time)  Medications Ordered in ED Medications - No data to display   Initial Impression / Assessment and Plan / ED Course  I have reviewed the triage vital signs and the nursing notes.  Pertinent labs & imaging results that were available during my care of the patient were reviewed by me and considered in my medical decision making (see chart for details).  Clinical Course   CT without any acute abnormality. Patient remains baseline. Will discharge back to nursing home.    Final Clinical Impressions(s) / ED Diagnoses   Final diagnoses:  Fall, initial encounter  Facial abrasion, initial encounter    New Prescriptions Discharge Medication List as of 08/08/2016  6:10 PM       Loren Raceravid Ariday Brinker, MD 08/09/16 [phone]

## 2016-09-03 ENCOUNTER — Telehealth: Payer: Self-pay | Admitting: Endocrinology

## 2016-09-03 ENCOUNTER — Ambulatory Visit: Payer: Medicare Other | Admitting: Endocrinology

## 2016-09-03 NOTE — Telephone Encounter (Signed)
Patient no showed today's appt. Please advise on how to follow up. °A. No follow up necessary. °B. Follow up urgent. Contact patient immediately. °C. Follow up necessary. Contact patient and schedule visit in ___ days. °D. Follow up advised. Contact patient and schedule visit in ____weeks. ° °

## 2016-09-03 NOTE — Telephone Encounter (Signed)
Pt arrived late and was rescheduled.

## 2016-09-08 ENCOUNTER — Telehealth: Payer: Self-pay | Admitting: Endocrinology

## 2016-09-08 NOTE — Telephone Encounter (Signed)
Marylene Landngela calling from [facility] [place] # [phone]  They are in need of what to do with pt's insulin-did we receive the order and if so can we send it back please

## 2016-09-14 NOTE — Telephone Encounter (Signed)
Marylene Landngela from [facility] calling on the status of patient insulin. Please advise [phone]

## 2016-09-15 ENCOUNTER — Encounter: Payer: Self-pay | Admitting: Endocrinology

## 2016-09-15 ENCOUNTER — Ambulatory Visit (INDEPENDENT_AMBULATORY_CARE_PROVIDER_SITE_OTHER): Payer: Medicare Other | Admitting: Endocrinology

## 2016-09-15 VITALS — BP 120/64 | HR 67 | Temp 98.0°F | Resp 14 | Ht 72.0 in | Wt 166.2 lb

## 2016-09-15 DIAGNOSIS — E1165 Type 2 diabetes mellitus with hyperglycemia: Secondary | ICD-10-CM

## 2016-09-15 DIAGNOSIS — Z794 Long term (current) use of insulin: Secondary | ICD-10-CM | POA: Diagnosis not present

## 2016-09-15 LAB — POCT GLYCOSYLATED HEMOGLOBIN (HGB A1C): Hemoglobin A1C: 8.7

## 2016-09-15 NOTE — Patient Instructions (Signed)
REVISED insulin orders:  LANTUS insulin: Increase the dose to 24 units in the morning daily.  CHANGE Lantus when the current vial is finished to [place]jeo insulin 24 units in the morning daily  HUMALOG insulin:  BREAKFAST doses are as follows: Blood sugar under 200 = 7 units 200-249 = 8 units 250+ = 9 units  LUNCH and dinner doses: With blood sugar under 200 = 9 units 200-249 = 10 units and 250+ = 11 units  Continue checking blood sugars as before  Please fax the blood sugar readings to [phone] in 3 weeks

## 2016-09-15 NOTE — Progress Notes (Signed)
Patient ID: Javier Murillo, male   DOB: 1930-05-03, 82 y.o.   MRN: [id number]           Reason for Appointment: Follow-up for Type 2 Diabetes  Referring physician: [name]  History of Present Illness:          Diagnosis: Type 2 diabetes mellitus, date of diagnosis:1975        Past history: He may have been treated with oral agents for the first few years of his diabetes management He thinks he has been on insulin for several years, uncertain how long. For some reason he has been continued on glyburide also which she probably has been taking for a long time Not clear if he had taken metformin or why it was stopped  He has had fair control of his diabetes over the last 2-3 years with A1c ranging from 7.4-9.3, relatively higher since 10/2013  On his initial consultation he had been on a fixed dose of Lantus along with sliding scale Humalog based on blood sugar levels before meals Because of persistently high A1c of 9% or more since 06/2014 he seen in consultation and he was started on pre-meal insulin along with correction doses His glyburide stopped because of his age and needing basal bolus insulin   Recent history:  INSULIN regimen is described as:   Lantus 20 units once a day  Humalog 9 units at lunch and supper and 6 units with breakfast Correction doses for high readings: If blood sugar below 200: No insulin  Blood sugar 200-249 = 1 unit 250 and above = 2 units  Currently taking basal bolus insulin regimen as monotherapy for diabetes Previously his Lantus was changed from bedtime only to twice a day but this was apparently changed to once a day by the nursing home prior to his last visit  His A1c is now slightly higher at 8.7  Current blood sugar patterns and problems identified:  FASTING blood sugars are generally fairly good with some high readings at times  Blood sugars maybe mostly higher at lunchtime although occasionally near normal  Blood sugars are mostly higher at  suppertime, frequently over 200  Readings after supper are quite variable  Apparently his additional sliding scale insulin is not being given consistently on review of his records He is usually getting his insulin right before eating but occasionally after   Glucose monitoring:  done 3+ times a day Glucometer: Unknown        Blood Glucose readings by time of day from recent record of nursing home.  All records not available at the time of dictation   Mean values apply above for all meters except median for One Touch  PRE-MEAL Fasting Lunch Dinner Bedtime Overall  Glucose range:    126-311    Mean/median:         Diet: He is generally ordering whatever he wants on his menu at the nursing home and he usually finishes his meal, Usually avoiding sweets Hypoglycemia: None         Self-care:     Meals: 3 meals per day. Breakfast is eggs, toast, sausage.  Lunch and dinner usually a meat or fish along with vegetables, bread and sugar-free ice cream.  Usually has sugar-free snacks           Physical activity:  none, in wheelchair         Dietician visit, most recent: Never .  Weight history:  Wt Readings from Last 3 Encounters:  09/15/16 166 lb 3.2 oz (75.4 kg)  06/04/16 168 lb 3.2 oz (76.3 kg)  02/21/16 170 lb (77.1 kg)    Glycemic control:   Lab Results  Component Value Date   HGBA1C 8.7 09/15/2016   HGBA1C 8.3 06/04/2016   HGBA1C 7.9 03/05/2016   Lab Results  Component Value Date   MICROALBUR 1.6 06/04/2016   LDLCALC 80 12/05/2015   CREATININE 1.25 06/04/2016    Urine microalbumin/creatinine ratio 50.4 done on 10/27/13     Medication List       Accurate as of 09/15/16 11:59 PM. Always use your most recent med list.          acetaminophen 500 MG tablet Commonly known as:  TYLENOL Take 1 tablet (500 mg total) by mouth every 6 (six) hours as needed for moderate pain.   amLODipine 5 MG tablet Commonly known as:  NORVASC Take 1 tablet (5 mg total)  by mouth daily.   cholecalciferol 1000 units tablet Commonly known as:  VITAMIN D Take 1,000 Units by mouth every evening.   ferrous sulfate 325 (65 FE) MG tablet Take 325 mg by mouth daily with breakfast.   FLUoxetine 20 MG capsule Commonly known as:  PROZAC Take 20 mg by mouth daily.   furosemide 20 MG tablet Commonly known as:  LASIX Take 1 tablet (20 mg total) by mouth daily.   HUMALOG 100 UNIT/ML injection Generic drug:  insulin lispro Inject 6-9 Units into the skin 4 (four) times daily -  with meals and at bedtime. 6 units with breakfast 9 units before lunch and 9 units before supper. If BS<200 = 0 units 200-249=1 unit >250=2 units as needed   insulin glargine 100 UNIT/ML injection Commonly known as:  LANTUS Inject 20 Units into the skin daily.   lisinopril 10 MG tablet Commonly known as:  PRINIVIL,ZESTRIL Take 1 tablet (10 mg total) by mouth daily.       Allergies:  Allergies  Allergen Reactions  . Fluvirin [Influenza Virus Vaccine Split] Other (See Comments)    Gave patient flu    Past Medical History:  Diagnosis Date  . Arthritis   . Chronic low back pain   . Degenerative arthritis   . Depression   . Diabetes mellitus without complication (HCC)   . Diabetic peripheral neuropathy (HCC)   . Gait disorder   . Hyperlipidemia   . Hypertension   . Kidney stones    only once  . Lumbosacral spondylosis   . Peripheral vascular disease (HCC)   . Quadriplegia and quadriparesis (HCC) 08/20/2014    Past Surgical History:  Procedure Laterality Date  . ANTERIOR CERVICAL DECOMP/DISCECTOMY FUSION N/A 08/22/2014   Procedure: ANTERIOR CERVICAL DECOMPRESSION/DISCECTOMY FUSION, cervical three-four;  Surgeon: Hewitt Shorts, MD;  Location: [facility];  Service: Neurosurgery;  Laterality: N/A;  C3-4 anterior cervical decompression with fusion plating and bonegraft  . APPENDECTOMY    . BACK SURGERY     x 7  . bone spur removed  right sholder    . bursitis  Bilateral    olecranon I&D  . CATARACT EXTRACTION W/ INTRAOCULAR LENS  IMPLANT, BILATERAL    . EYE SURGERY     bilateral cataracts  . LUMBAR SPINE SURGERY     x7  . SHOULDER SURGERY Bilateral     Family History  Problem Relation Age of Onset  . Cancer Mother   . Heart attack Father   .  Dementia Sister     Social History:  reports that he has quit smoking. His smoking use included Cigarettes. He has a 10.00 pack-year smoking history. He has never used smokeless tobacco. He reports that he does not drink alcohol or use drugs.    Review of Systems        Lipids: not on any medications for hyperlipidemia       Lab Results  Component Value Date   CHOL 164 12/05/2015   HDL 54.90 12/05/2015   LDLCALC 80 12/05/2015   TRIG 146.0 12/05/2015   CHOLHDL 3 12/05/2015               He does have weakness in his legs from central cord problems previously and not able to ambulate Also some numbness in fingers  History of leg edema especially on the left  Physical Examination:  BP 120/64   Pulse 67   Temp 98 F (36.7 C)   Resp 14   Ht 6' (1.829 m)   Wt 166 lb 3.2 oz (75.4 kg)   SpO2 96%   BMI 22.54 kg/m    ASSESSMENT:  Diabetes type 2, uncontrolled  See history of present illness for detailed discussion of his current management, blood sugar patterns and problems identified  He is on a basal bolus insulin regimen with Lantus  now only once a day even though he was previously taking twice a day  Blood sugars are mostly higher as the day progresses although not always higher after evening meal Again blood sugars are probably reasonable for his age A1c however is increasing and now 8.7 Some variability in diet may be affecting his blood sugars   PLAN:  Change Lantus to Toujeo insulin 24 units This will provide more 24-hour controlled and consistent readings While he has a current supply of Lantus he can try this with 24 units daily Will continue same doses of mealtime  insulin except 7 units in the morning He will have his sliding scale incorporated into the orders for pre-meal insulin so that he gets consistent doses for high readings also     Patient Instructions  REVISED insulin orders:  LANTUS insulin: Increase the dose to 24 units in the morning daily.  CHANGE Lantus when the current vial is finished to Toujeo insulin 24 units in the morning daily  HUMALOG insulin:  BREAKFAST doses are as follows: Blood sugar under 200 = 7 units 200-249 = 8 units 250+ = 9 units  LUNCH and dinner doses: With blood sugar under 200 = 9 units 200-249 = 10 units and 250+ = 11 units  Continue checking blood sugars as before  Please fax the blood sugar readings to [phone] in 3 weeks          Terius Jacuinde 09/16/2016, 8:09 AM   Note: This office note was prepared with Insurance underwriter. Any transcriptional errors that result from this process are unintentional. Sleep

## 2016-10-24 ENCOUNTER — Inpatient Hospital Stay (HOSPITAL_COMMUNITY)
Admission: EM | Admit: 2016-10-24 | Discharge: 2016-10-27 | DRG: 557 | Disposition: A | Discharge status: Skilled Nursing Facility | Payer: Medicare Other | Attending: Family Medicine | Admitting: Family Medicine

## 2016-10-24 ENCOUNTER — Emergency Department (HOSPITAL_COMMUNITY): Payer: Medicare Other

## 2016-10-24 ENCOUNTER — Encounter (HOSPITAL_COMMUNITY): Payer: Self-pay | Admitting: Emergency Medicine

## 2016-10-24 DIAGNOSIS — G8929 Other chronic pain: Secondary | ICD-10-CM | POA: Diagnosis present

## 2016-10-24 DIAGNOSIS — E0840 Diabetes mellitus due to underlying condition with diabetic neuropathy, unspecified: Secondary | ICD-10-CM | POA: Diagnosis not present

## 2016-10-24 DIAGNOSIS — Z887 Allergy status to serum and vaccine status: Secondary | ICD-10-CM | POA: Diagnosis not present

## 2016-10-24 DIAGNOSIS — B961 Klebsiella pneumoniae [K. pneumoniae] as the cause of diseases classified elsewhere: Secondary | ICD-10-CM | POA: Diagnosis present

## 2016-10-24 DIAGNOSIS — F0391 Unspecified dementia with behavioral disturbance: Secondary | ICD-10-CM | POA: Diagnosis present

## 2016-10-24 DIAGNOSIS — Z794 Long term (current) use of insulin: Secondary | ICD-10-CM | POA: Diagnosis not present

## 2016-10-24 DIAGNOSIS — Z79899 Other long term (current) drug therapy: Secondary | ICD-10-CM | POA: Diagnosis not present

## 2016-10-24 DIAGNOSIS — R4182 Altered mental status, unspecified: Secondary | ICD-10-CM | POA: Diagnosis not present

## 2016-10-24 DIAGNOSIS — E1122 Type 2 diabetes mellitus with diabetic chronic kidney disease: Secondary | ICD-10-CM | POA: Diagnosis present

## 2016-10-24 DIAGNOSIS — R748 Abnormal levels of other serum enzymes: Secondary | ICD-10-CM | POA: Diagnosis present

## 2016-10-24 DIAGNOSIS — F329 Major depressive disorder, single episode, unspecified: Secondary | ICD-10-CM | POA: Diagnosis present

## 2016-10-24 DIAGNOSIS — E1151 Type 2 diabetes mellitus with diabetic peripheral angiopathy without gangrene: Secondary | ICD-10-CM | POA: Diagnosis present

## 2016-10-24 DIAGNOSIS — E1140 Type 2 diabetes mellitus with diabetic neuropathy, unspecified: Secondary | ICD-10-CM | POA: Diagnosis present

## 2016-10-24 DIAGNOSIS — N390 Urinary tract infection, site not specified: Secondary | ICD-10-CM | POA: Diagnosis not present

## 2016-10-24 DIAGNOSIS — N182 Chronic kidney disease, stage 2 (mild): Secondary | ICD-10-CM | POA: Diagnosis present

## 2016-10-24 DIAGNOSIS — M6282 Rhabdomyolysis: Principal | ICD-10-CM | POA: Diagnosis present

## 2016-10-24 DIAGNOSIS — N178 Other acute kidney failure: Secondary | ICD-10-CM

## 2016-10-24 DIAGNOSIS — R778 Other specified abnormalities of plasma proteins: Secondary | ICD-10-CM | POA: Diagnosis present

## 2016-10-24 DIAGNOSIS — Z87891 Personal history of nicotine dependence: Secondary | ICD-10-CM | POA: Diagnosis not present

## 2016-10-24 DIAGNOSIS — G9340 Encephalopathy, unspecified: Secondary | ICD-10-CM | POA: Diagnosis present

## 2016-10-24 DIAGNOSIS — R7989 Other specified abnormal findings of blood chemistry: Secondary | ICD-10-CM

## 2016-10-24 DIAGNOSIS — D539 Nutritional anemia, unspecified: Secondary | ICD-10-CM | POA: Diagnosis present

## 2016-10-24 DIAGNOSIS — B9689 Other specified bacterial agents as the cause of diseases classified elsewhere: Secondary | ICD-10-CM | POA: Diagnosis present

## 2016-10-24 DIAGNOSIS — N179 Acute kidney failure, unspecified: Secondary | ICD-10-CM | POA: Diagnosis present

## 2016-10-24 DIAGNOSIS — E785 Hyperlipidemia, unspecified: Secondary | ICD-10-CM | POA: Diagnosis present

## 2016-10-24 DIAGNOSIS — E1142 Type 2 diabetes mellitus with diabetic polyneuropathy: Secondary | ICD-10-CM | POA: Diagnosis present

## 2016-10-24 DIAGNOSIS — I349 Nonrheumatic mitral valve disorder, unspecified: Secondary | ICD-10-CM | POA: Diagnosis not present

## 2016-10-24 DIAGNOSIS — E860 Dehydration: Secondary | ICD-10-CM | POA: Diagnosis present

## 2016-10-24 DIAGNOSIS — M545 Low back pain: Secondary | ICD-10-CM | POA: Diagnosis present

## 2016-10-24 DIAGNOSIS — T43025A Adverse effect of tetracyclic antidepressants, initial encounter: Secondary | ICD-10-CM | POA: Diagnosis present

## 2016-10-24 DIAGNOSIS — Z981 Arthrodesis status: Secondary | ICD-10-CM

## 2016-10-24 LAB — COMPREHENSIVE METABOLIC PANEL
ALBUMIN: 4.2 g/dL (ref 3.5–5.0)
ALK PHOS: 58 U/L (ref 38–126)
ALT: 27 U/L (ref 17–63)
ANION GAP: 8 (ref 5–15)
AST: 96 U/L — ABNORMAL HIGH (ref 15–41)
BUN: 35 mg/dL — ABNORMAL HIGH (ref 6–20)
CALCIUM: 9.8 mg/dL (ref 8.9–10.3)
CO2: 28 mmol/L (ref 22–32)
Chloride: 106 mmol/L (ref 101–111)
Creatinine, Ser: 1.58 mg/dL — ABNORMAL HIGH (ref 0.61–1.24)
GFR calc Af Amer: 45 mL/min — ABNORMAL LOW (ref >60)
GFR calc non Af Amer: 38 mL/min — ABNORMAL LOW (ref >60)
GLUCOSE: 79 mg/dL (ref 65–99)
Potassium: 4 mmol/L (ref 3.5–5.1)
SODIUM: 142 mmol/L (ref 135–145)
Total Bilirubin: 0.6 mg/dL (ref 0.3–1.2)
Total Protein: 6.9 g/dL (ref 6.5–8.1)

## 2016-10-24 LAB — URINALYSIS, ROUTINE W REFLEX MICROSCOPIC
BILIRUBIN URINE: NEGATIVE
Glucose, UA: NEGATIVE mg/dL
Ketones, ur: NEGATIVE mg/dL
LEUKOCYTES UA: NEGATIVE
NITRITE: NEGATIVE
PH: 5.5 (ref 5.0–8.0)
Protein, ur: NEGATIVE mg/dL
SPECIFIC GRAVITY, URINE: 1.019 (ref 1.005–1.030)

## 2016-10-24 LAB — BLOOD GAS, ARTERIAL
Acid-Base Excess: 3.6 mmol/L — ABNORMAL HIGH (ref 0.0–2.0)
Bicarbonate: 27.8 mmol/L (ref 20.0–28.0)
Drawn by: 441261
FIO2: 21
O2 Saturation: 96.1 %
PATIENT TEMPERATURE: 98.6
PH ART: 7.433 (ref 7.350–7.450)
PO2 ART: 81.4 mmHg — AB (ref 83.0–108.0)
pCO2 arterial: 42.3 mmHg (ref 32.0–48.0)

## 2016-10-24 LAB — CBC WITH DIFFERENTIAL/PLATELET
BASOS ABS: 0 10*3/uL (ref 0.0–0.1)
Basophils Relative: 0 %
EOS ABS: 0 10*3/uL (ref 0.0–0.7)
Eosinophils Relative: 0 %
HCT: 40 % (ref 39.0–52.0)
HEMOGLOBIN: 13.1 g/dL (ref 13.0–17.0)
LYMPHS PCT: 22 %
Lymphs Abs: 2.8 10*3/uL (ref 0.7–4.0)
MCH: 32.9 pg (ref 26.0–34.0)
MCHC: 32.8 g/dL (ref 30.0–36.0)
MCV: 100.5 fL — ABNORMAL HIGH (ref 78.0–100.0)
Monocytes Absolute: 1.1 10*3/uL — ABNORMAL HIGH (ref 0.1–1.0)
Monocytes Relative: 9 %
NEUTROS ABS: 8.8 10*3/uL — AB (ref 1.7–7.7)
NEUTROS PCT: 69 %
Platelets: 305 10*3/uL (ref 150–400)
RBC: 3.98 MIL/uL — ABNORMAL LOW (ref 4.22–5.81)
RDW: 12.6 % (ref 11.5–15.5)
WBC: 12.7 10*3/uL — AB (ref 4.0–10.5)

## 2016-10-24 LAB — URINE MICROSCOPIC-ADD ON
BACTERIA UA: NONE SEEN
RBC / HPF: NONE SEEN RBC/hpf (ref 0–5)
Squamous Epithelial / LPF: NONE SEEN

## 2016-10-24 LAB — MRSA PCR SCREENING: MRSA by PCR: NEGATIVE

## 2016-10-24 LAB — GLUCOSE, CAPILLARY: Glucose-Capillary: 53 mg/dL — ABNORMAL LOW (ref 65–99)

## 2016-10-24 LAB — AMMONIA: AMMONIA: 9 umol/L (ref 9–35)

## 2016-10-24 LAB — TROPONIN I
Troponin I: 0.12 ng/mL (ref <0.03)
Troponin I: 0.11 ng/mL (ref <0.03)

## 2016-10-24 MED ORDER — SODIUM CHLORIDE 0.9 % IV SOLN: INTRAVENOUS | Status: DC

## 2016-10-24 MED ORDER — CHLORHEXIDINE GLUCONATE 0.12 % MT SOLN
15.0000 mL | Freq: Two times a day (BID) | OROMUCOSAL | Status: DC
  Administered 2016-10-25 – 2016-10-27 (×6): 15 mL via OROMUCOSAL
  Filled 2016-10-24 (×4): qty 15

## 2016-10-24 MED ORDER — ONDANSETRON HCL 4 MG PO TABS: 4.0000 mg | ORAL_TABLET | Freq: Four times a day (QID) | ORAL | Status: DC | PRN

## 2016-10-24 MED ORDER — SODIUM CHLORIDE 0.9% FLUSH
3.0000 mL | Freq: Two times a day (BID) | INTRAVENOUS | Status: DC
  Administered 2016-10-25 – 2016-10-27 (×5): 3 mL via INTRAVENOUS

## 2016-10-24 MED ORDER — ACETAMINOPHEN 650 MG RE SUPP
650.0000 mg | Freq: Four times a day (QID) | RECTAL | Status: DC | PRN
  Administered 2016-10-27: 650 mg via RECTAL
  Filled 2016-10-24: qty 1

## 2016-10-24 MED ORDER — DEXTROSE 50 % IV SOLN
INTRAVENOUS | Status: AC
  Administered 2016-10-24: 50 mL via INTRAVENOUS
  Filled 2016-10-24: qty 50

## 2016-10-24 MED ORDER — SODIUM CHLORIDE 0.9 % IV SOLN
INTRAVENOUS | Status: DC
  Administered 2016-10-24: 75 mL/h via INTRAVENOUS

## 2016-10-24 MED ORDER — ENOXAPARIN SODIUM 40 MG/0.4ML ~~LOC~~ SOLN
40.0000 mg | SUBCUTANEOUS | Status: DC
  Administered 2016-10-24 – 2016-10-26 (×3): 40 mg via SUBCUTANEOUS
  Filled 2016-10-24 (×3): qty 0.4

## 2016-10-24 MED ORDER — INSULIN ASPART 100 UNIT/ML ~~LOC~~ SOLN: 0.0000 [IU] | Freq: Three times a day (TID) | SUBCUTANEOUS | Status: DC

## 2016-10-24 MED ORDER — ORAL CARE MOUTH RINSE
15.0000 mL | Freq: Two times a day (BID) | OROMUCOSAL | Status: DC
  Administered 2016-10-25 – 2016-10-27 (×5): 15 mL via OROMUCOSAL

## 2016-10-24 MED ORDER — ONDANSETRON HCL 4 MG/2ML IJ SOLN: 4.0000 mg | Freq: Four times a day (QID) | INTRAMUSCULAR | Status: DC | PRN

## 2016-10-24 MED ORDER — DEXTROSE-NACL 5-0.45 % IV SOLN
INTRAVENOUS | Status: DC
  Administered 2016-10-24 – 2016-10-25 (×2): via INTRAVENOUS

## 2016-10-24 MED ORDER — ACETAMINOPHEN 325 MG PO TABS: 650.0000 mg | ORAL_TABLET | Freq: Four times a day (QID) | ORAL | Status: DC | PRN

## 2016-10-24 MED ORDER — DEXTROSE 50 % IV SOLN
1.0000 | Freq: Once | INTRAVENOUS | Status: AC
  Administered 2016-10-24: 50 mL via INTRAVENOUS

## 2016-10-24 NOTE — ED Triage Notes (Signed)
Per EMS- [facility] is a [facility]. Family visiting today and thought the patient was more lethargic. Patient has a history of dementia. Patient started a new drug-Mirtazapine 15 mg. Patient does respond to touch/shaking.

## 2016-10-24 NOTE — ED Notes (Signed)
Called for 20 minute timer. ICU to call when patient can come to ICU.

## 2016-10-24 NOTE — Progress Notes (Signed)
Hypoglycemic Event  CBG: 53  Treatment:  1 amp d50  Symptoms: none  Follow-up CBG: Time:2215 CBG Result:154  Possible Reasons for Event: pt npo  Comments/MD notified: none/yes    Conley RollsGarland, Javier Murillo

## 2016-10-24 NOTE — H&P (Signed)
History and Physical        Hospital Admission Note Date: 10/24/2016  Patient name: Javier Murillo Medical record number: [id number] Date of birth: 27-Jun-1930 Age: 82 y.o. Gender: male  PCP: Gaye AlkenBARNES,ELIZABETH STEWART, MD   Referring physician: Dr Freida BusmanAllen   [facility]   Chief Complaint:  Lethargy and somnolence since yesterday  HPI: Patient is a 82 year old male with history of dementia, arthritis, diabetes, hypertension, hyperlipidemia, [facility] for lethargy and somnolence. History was obtained from the daughter and son-in-law. Per the daughter, the facility called them that patient was acting more lethargic and sleepy since yesterday evening. Patient has history of dementia with sundowning and behavioral issues. He was recently started on Remeron 15 mg daily instead of fluoxetine on November 15. Patient has taken Remeron on November 15, 16th and 17th at 8 PM daily. Since later during the day yesterday, patient has been more somnolent and today was difficult to arouse. At the time of my examination, patient was mostly snoring during the encounter. He was able to wake up with shaking but would fall asleep right back. Otherwise family reported that before starting the Remeron and on Thursday, November 16, patient was still at his baseline, went out with the family, conversing at his baseline. No other issues, chest pain, shortness of breath, fevers or chills, nausea, vomiting or any diarrhea or URI symptoms.    ED work-up/course:  BMET showed sodium 142, potassium 4.0, BUN 35, creatinine 1.5, baseline creatinine 1.2 in June 2017. Troponin 0.12 WBC 12.7 hemoglobin 13.1  Review of Systems: Positives marked in 'bold' Unable to obtain from the patient due to his mental status  Past Medical History: Past  Medical History:  Diagnosis Date  . Arthritis   . Chronic low back pain   . Degenerative arthritis   . Depression   . Diabetes mellitus without complication (HCC)   . Diabetic peripheral neuropathy (HCC)   . Gait disorder   . Hyperlipidemia   . Hypertension   . Kidney stones    only once  . Lumbosacral spondylosis   . Peripheral vascular disease (HCC)   . Quadriplegia and quadriparesis (HCC) 08/20/2014    Past Surgical History:  Procedure Laterality Date  . ANTERIOR CERVICAL DECOMP/DISCECTOMY FUSION N/A 08/22/2014   Procedure: ANTERIOR CERVICAL DECOMPRESSION/DISCECTOMY FUSION, cervical three-four;  Surgeon: Hewitt Shortsobert W Nudelman, MD;  Location: [facility];  Service: Neurosurgery;  Laterality: N/A;  C3-4 anterior cervical decompression with fusion plating and bonegraft  . APPENDECTOMY    . BACK SURGERY     x 7  . bone spur removed  right sholder    . bursitis Bilateral    olecranon I&D  . CATARACT EXTRACTION W/ INTRAOCULAR LENS  IMPLANT, BILATERAL    . EYE SURGERY     bilateral cataracts  . LUMBAR SPINE SURGERY     x7  . SHOULDER SURGERY Bilateral     Medications: Prior to Admission medications   Medication Sig Start Date End Date Taking? Authorizing Provider  acetaminophen (TYLENOL) 500 MG tablet Take 1 tablet (500 mg total) by mouth every 6 (six) hours as needed  for moderate pain. 02/21/16  Yes Gerhard Munchobert Lockwood, MD  amLODipine (NORVASC) 5 MG tablet Take 1 tablet (5 mg total) by mouth daily. 09/17/15  Yes Meredeth IdeGagan S Lama, MD  cholecalciferol (VITAMIN D) 1000 UNITS tablet Take 1,000 Units by mouth every evening.    Yes Historical Provider, MD  ferrous sulfate 325 (65 FE) MG tablet Take 325 mg by mouth daily with breakfast.   Yes Historical Provider, MD  FLUoxetine (PROZAC) 20 MG capsule Take 20 mg by mouth daily.   Yes Historical Provider, MD  furosemide (LASIX) 20 MG tablet Take 1 tablet (20 mg total) by mouth daily. 09/17/15  Yes Meredeth IdeGagan S Lama, MD  HUMALOG 100 UNIT/ML injection  Inject 7-9 Units into the skin 2 (two) times daily. Sliding scale- if 0-200= 7 units, 201-249=8 units, 250+=9 units 03/26/15  Yes Historical Provider, MD  lisinopril (PRINIVIL,ZESTRIL) 10 MG tablet Take 1 tablet (10 mg total) by mouth daily. 09/17/15  Yes Meredeth IdeGagan S Lama, MD  mirtazapine (REMERON) 15 MG tablet Take 15 mg by mouth at bedtime. 10/21/16  Yes Historical Provider, MD  TOUJEO SOLOSTAR 300 UNIT/ML SOPN Inject 24 Units into the skin daily. 10/16/16  Yes Historical Provider, MD    Allergies:   Allergies  Allergen Reactions  . Fluvirin [Influenza Virus Vaccine Split] Other (See Comments)    Gave patient flu    Social History:  Per chart review, reports that he has quit smoking. His smoking use included Cigarettes. He has a 10.00 pack-year smoking history. He has never used smokeless tobacco. He reports that he does not drink alcohol or use drugs.  Family History: Family History  Problem Relation Age of Onset  . Cancer Mother   . Heart attack Father   . Dementia Sister     Physical Exam: Blood pressure 133/58, pulse 78, temperature 97.8 F (36.6 C), temperature source Oral, resp. rate 14, height 5\' 11"  (1.803 m), weight 77.1 kg (170 lb), SpO2 93 %. General:Somnolent, snoring during the encounter HEENT: normocephalic, atraumatic, anicteric sclera, pink conjunctiva, pupils equal and reactive to light and accomodation, oropharynx clear Neck: supple, no masses or lymphadenopathy, no goiter, no bruits  Heart: Regular rate and rhythm, without murmurs, rubs or gallops. Lungs: Clear to auscultation bilaterally, no wheezing, rales or rhonchi. Abdomen: Soft, nontender, nondistended, positive bowel sounds, no masses. Extremities: No clubbing, cyanosis or edema with positive pedal pulses. Neuro: unable to assess Psych: somnolent Skin: no rashes or lesions, warm and dry   LABS on Admission:  Basic Metabolic Panel:  Recent Labs Lab 10/24/16 1350  NA 142  K 4.0  CL 106  CO2 28    GLUCOSE 79  BUN 35*  CREATININE 1.58*  CALCIUM 9.8   Liver Function Tests:  Recent Labs Lab 10/24/16 1350  AST 96*  ALT 27  ALKPHOS 58  BILITOT 0.6  PROT 6.9  ALBUMIN 4.2   No results for input(s): LIPASE, AMYLASE in the last 168 hours. No results for input(s): AMMONIA in the last 168 hours. CBC:  Recent Labs Lab 10/24/16 1350  WBC 12.7*  NEUTROABS 8.8*  HGB 13.1  HCT 40.0  MCV 100.5*  PLT 305   Cardiac Enzymes:  Recent Labs Lab 10/24/16 1350  TROPONINI 0.12*   BNP: Invalid input(s): POCBNP CBG: No results for input(s): GLUCAP in the last 168 hours.  Radiological Exams on Admission:  No results found.  *I have personally reviewed the images above*  EKG: Independently reviewed. Rate 69, no acute ST-T wave changes suggestive  of ischemia   Assessment/Plan Principal Problem:   Acute encephalopathy/somnolence: Possibly due to side effect of Remeron started at 15 mg. T half-life of 20-40hrs. UA negative for UTI. CT head negative for acute intracranial abnormality. Last dose of Remeron was on 11/17 at 8 PM. - obtain ABG and ammonia level for complete workup - Fluoxetine was discontinued by PCP, discontinue Remeron - Continue gentle hydration   Active Problems:   Acute renal failure (HCC) - Likely due to dehydration, hold lisinopril, furosemide - Placed on gentle hydration until patient is alert enough to eat    Diabetes mellitus with neuropathy (HCC) - Hold long-acting insulin until alert enough to eat -  will place on SSI q4hours     Elevated troponin - Possibly due to troponin leak from his acute kidney injury, patient had no dyspnea or cardiac symptoms or EKG changes. - However now due to elevated troponin, obtain serial cardiac enzymes and 2-D echocardiogram for further workup  Dementia - Close monitoring, sitter if needed PT OT evaluation,   DVT prophylaxis: Lovenox  CODE STATUS: Discussed with the daughter, HPOA, full CODE  STATUS  Consults called: None  Family Communication: Admission, patients condition and plan of care including tests being ordered have been discussed with the patient's daughter and son in law who indicates understanding and agree with the plan and Code Status  Admission status: inpatient   Disposition plan: Further plan will depend as patient's clinical course evolves and further radiologic and laboratory data become available.   At the time of admission, it appears that the appropriate admission status for this patient is INPATIENT . This is judged to be reasonable and necessary in order to provide the required intensity of service to ensure the patient's safety given the presenting symptoms, physical exam findings, and initial radiographic and laboratory data in the context of their chronic comorbidities.     Time Spent on Admission:   Dalicia Kisner M.D. [facility] 10/24/2016, 4:01 PM Pager: [phone]  If 7PM-7AM, please contact night-coverage [email or web address] Password [email or web address]

## 2016-10-24 NOTE — ED Notes (Signed)
Bed: WA06 Expected date:  Expected time:  Means of arrival:  Comments: AMS 

## 2016-10-24 NOTE — ED Provider Notes (Signed)
[facility]-EMERGENCY DEPT Provider Note   CSN: [id number] Arrival date & time: 10/24/16  1247     History   Chief Complaint Chief Complaint  Patient presents with  . Altered Mental Status    HPI Javier Murillo is a 82 y.o. male.  82 year old male who has had three-day history of decreasing mental status. Patient was taken fluoxetine and had recently switched to mirtazapine. Since that time he has not been as alert. He does have history of dementia although family states this is different. No reported fever, vomiting. He did have a history of a head injury a week ago but was checked out for that and no acute findings. No recent cough or shortness of breath. Symptoms of a progressively worse and nothing seems to make them better. No treatment use prior to arrival      Past Medical History:  Diagnosis Date  . Arthritis   . Chronic low back pain   . Degenerative arthritis   . Depression   . Diabetes mellitus without complication (HCC)   . Diabetic peripheral neuropathy (HCC)   . Gait disorder   . Hyperlipidemia   . Hypertension   . Kidney stones    only once  . Lumbosacral spondylosis   . Peripheral vascular disease (HCC)   . Quadriplegia and quadriparesis (HCC) 08/20/2014    Patient Active Problem List   Diagnosis Date Noted  . Acute encephalopathy 09/15/2015  . Acute respiratory failure with hypoxia (HCC) 09/15/2015  . Bradycardia 09/15/2015  . Diabetes mellitus with neuropathy (HCC) 09/15/2015  . Anemia 01/30/2015  . Quadriplegia and quadriparesis (HCC) 08/20/2014  . Gait disorder 08/20/2014  . Weakness 07/23/2014  . Diabetic neuropathy (HCC) 07/23/2014  . Acute renal failure (HCC) 07/23/2014  . HTN (hypertension) 07/23/2014  . Dyslipidemia 07/23/2014    Past Surgical History:  Procedure Laterality Date  . ANTERIOR CERVICAL DECOMP/DISCECTOMY FUSION N/A 08/22/2014   Procedure: ANTERIOR CERVICAL DECOMPRESSION/DISCECTOMY FUSION, cervical three-four;  Surgeon:  Hewitt Shorts, MD;  Location: [facility];  Service: Neurosurgery;  Laterality: N/A;  C3-4 anterior cervical decompression with fusion plating and bonegraft  . APPENDECTOMY    . BACK SURGERY     x 7  . bone spur removed  right sholder    . bursitis Bilateral    olecranon I&D  . CATARACT EXTRACTION W/ INTRAOCULAR LENS  IMPLANT, BILATERAL    . EYE SURGERY     bilateral cataracts  . LUMBAR SPINE SURGERY     x7  . SHOULDER SURGERY Bilateral        Home Medications    Prior to Admission medications   Medication Sig Start Date End Date Taking? Authorizing Provider  acetaminophen (TYLENOL) 500 MG tablet Take 1 tablet (500 mg total) by mouth every 6 (six) hours as needed for moderate pain. 02/21/16   Gerhard Munch, MD  amLODipine (NORVASC) 5 MG tablet Take 1 tablet (5 mg total) by mouth daily. 09/17/15   Meredeth Ide, MD  cholecalciferol (VITAMIN D) 1000 UNITS tablet Take 1,000 Units by mouth every evening.     Historical Provider, MD  ferrous sulfate 325 (65 FE) MG tablet Take 325 mg by mouth daily with breakfast.    Historical Provider, MD  FLUoxetine (PROZAC) 20 MG capsule Take 20 mg by mouth daily.    Historical Provider, MD  furosemide (LASIX) 20 MG tablet Take 1 tablet (20 mg total) by mouth daily. 09/17/15   Meredeth Ide, MD  HUMALOG 100 UNIT/ML injection  Inject 6-9 Units into the skin 4 (four) times daily -  with meals and at bedtime. 6 units with breakfast 9 units before lunch and 9 units before supper. If BS<200 = 0 units 200-249=1 unit >250=2 units as needed 03/26/15   Historical Provider, MD  insulin glargine (LANTUS) 100 UNIT/ML injection Inject 20 Units into the skin daily.     Historical Provider, MD  lisinopril (PRINIVIL,ZESTRIL) 10 MG tablet Take 1 tablet (10 mg total) by mouth daily. Patient taking differently: Take 10 mg by mouth every evening.  09/17/15   Meredeth IdeGagan S Lama, MD    Family History Family History  Problem Relation Age of Onset  . Cancer Mother   . Heart  attack Father   . Dementia Sister     Social History Social History  Substance Use Topics  . Smoking status: Former Smoker    Packs/day: 1.00    Years: 10.00    Types: Cigarettes  . Smokeless tobacco: Never Used  . Alcohol use No     Comment: very rare     Allergies   Fluvirin [influenza virus vaccine split]   Review of Systems Review of Systems  All other systems reviewed and are negative.    Physical Exam Updated Vital Signs BP 133/58 (BP Location: Right Arm)   Pulse 78   Temp 97.8 F (36.6 C) (Oral)   Resp 14   Ht 5\' 11"  (1.803 m)   Wt 77.1 kg   SpO2 93% Comment: Simultaneous filing. User may not have seen previous data.  BMI 23.71 kg/m   Physical Exam  Constitutional: He appears well-developed and well-nourished. He appears lethargic.  Non-toxic appearance. No distress.  HENT:  Head: Normocephalic and atraumatic.  Eyes: Conjunctivae, EOM and lids are normal. Pupils are equal, round, and reactive to light.  Neck: Normal range of motion. Neck supple. No tracheal deviation present. No thyroid mass present.  Cardiovascular: Normal rate, regular rhythm and normal heart sounds.  Exam reveals no gallop.   No murmur heard. Pulmonary/Chest: Effort normal and breath sounds normal. No stridor. No respiratory distress. He has no decreased breath sounds. He has no wheezes. He has no rhonchi. He has no rales.  Abdominal: Soft. Normal appearance and bowel sounds are normal. He exhibits no distension. There is no tenderness. There is no rebound and no CVA tenderness.  Musculoskeletal: Normal range of motion. He exhibits no edema or tenderness.  Neurological: He has normal strength. He appears lethargic. No cranial nerve deficit or sensory deficit. GCS eye subscore is 2. GCS verbal subscore is 3. GCS motor subscore is 5.  Withdraws to pain in all 4 extremities  Skin: Skin is warm and dry. No abrasion and no rash noted.  Psychiatric: He is inattentive.  Nursing note and  vitals reviewed.    ED Treatments / Results  Labs (all labs ordered are listed, but only abnormal results are displayed) Labs Reviewed  URINE CULTURE  CBC WITH DIFFERENTIAL/PLATELET  COMPREHENSIVE METABOLIC PANEL  TROPONIN I  URINALYSIS, ROUTINE W REFLEX MICROSCOPIC (NOT AT [facility])    EKG  EKG Interpretation  Date/Time:  Saturday October 24 2016 15:18:06 EST Ventricular Rate:  69 PR Interval:    QRS Duration: 98 QT Interval:  344 QTC Calculation: 369 R Axis:   1 Text Interpretation:  Sinus rhythm Low voltage, precordial leads Borderline repolarization abnormality Confirmed by Freida BusmanALLEN  MD, Nakiyah Beverley ([id number]) on 10/24/2016 3:23:22 PM       Radiology No results found.  Procedures Procedures (  including critical care time)  Medications Ordered in ED Medications  0.9 %  sodium chloride infusion (not administered)     Initial Impression / Assessment and Plan / ED Course  I have reviewed the triage vital signs and the nursing notes.  Pertinent labs & imaging results that were available during my care of the patient were reviewed by me and considered in my medical decision making (see chart for details).  Clinical Course     Patient given IV fluids here for dehydration. EKG without acute ischemic changes. No reported history of chest pain. Mild elevated troponin noted. Patient is protecting his airway at this time. Discussed hospitalist and will be admitted for further management  Final Clinical Impressions(s) / ED Diagnoses   Final diagnoses:  None    New Prescriptions New Prescriptions   No medications on file     Lorre NickAnthony Carliss Porcaro, MD 10/24/16 1533

## 2016-10-25 ENCOUNTER — Inpatient Hospital Stay (HOSPITAL_COMMUNITY): Payer: Medicare Other

## 2016-10-25 DIAGNOSIS — M6282 Rhabdomyolysis: Secondary | ICD-10-CM | POA: Diagnosis present

## 2016-10-25 DIAGNOSIS — I349 Nonrheumatic mitral valve disorder, unspecified: Secondary | ICD-10-CM

## 2016-10-25 LAB — GLUCOSE, CAPILLARY
Glucose-Capillary: 110 mg/dL — ABNORMAL HIGH (ref 65–99)
Glucose-Capillary: 105 mg/dL — ABNORMAL HIGH (ref 65–99)
Glucose-Capillary: 114 mg/dL — ABNORMAL HIGH (ref 65–99)
Glucose-Capillary: 147 mg/dL — ABNORMAL HIGH (ref 65–99)
Glucose-Capillary: 171 mg/dL — ABNORMAL HIGH (ref 65–99)

## 2016-10-25 LAB — BASIC METABOLIC PANEL
ANION GAP: 4 — AB (ref 5–15)
BUN: 30 mg/dL — ABNORMAL HIGH (ref 6–20)
CALCIUM: 8.9 mg/dL (ref 8.9–10.3)
CO2: 28 mmol/L (ref 22–32)
CREATININE: 1.22 mg/dL (ref 0.61–1.24)
Chloride: 108 mmol/L (ref 101–111)
GFR, EST NON AFRICAN AMERICAN: 53 mL/min — AB (ref >60)
GLUCOSE: 121 mg/dL — AB (ref 65–99)
Potassium: 3.8 mmol/L (ref 3.5–5.1)
Sodium: 140 mmol/L (ref 135–145)

## 2016-10-25 LAB — CBC
HCT: 36.2 % — ABNORMAL LOW (ref 39.0–52.0)
Hemoglobin: 11.9 g/dL — ABNORMAL LOW (ref 13.0–17.0)
MCH: 33.1 pg (ref 26.0–34.0)
MCHC: 32.9 g/dL (ref 30.0–36.0)
MCV: 100.8 fL — ABNORMAL HIGH (ref 78.0–100.0)
PLATELETS: 291 10*3/uL (ref 150–400)
RBC: 3.59 MIL/uL — ABNORMAL LOW (ref 4.22–5.81)
RDW: 12.6 % (ref 11.5–15.5)
WBC: 10.3 10*3/uL (ref 4.0–10.5)

## 2016-10-25 LAB — ECHOCARDIOGRAM COMPLETE
CHL CUP MV DEC (S): 317
CHL CUP TV REG PEAK VELOCITY: 278 cm/s
E/e' ratio: 9.12
EWDT: 317 ms
FS: 52 % — AB (ref 28–44)
Height: 71 in
IV/PV OW: 1
LA vol: 53.7 mL
LADIAMINDEX: 2.18 cm/m2
LASIZE: 43 mm
LAVOLA4C: 58.8 mL
LAVOLIN: 27.3 mL/m2
LDCA: 3.46 cm2
LEFT ATRIUM END SYS DIAM: 43 mm
LV E/e' medial: 9.12
LV E/e'average: 9.12
LV TDI E'LATERAL: 9.57
LV TDI E'MEDIAL: 7.51
LV e' LATERAL: 9.57 cm/s
LVOTD: 21 mm
Lateral S' vel: 21 cm/s
MV pk A vel: 114 m/s
MV pk E vel: 87.3 m/s
MVPG: 3 mmHg
PW: 10.8 mm — AB (ref 0.6–1.1)
RV sys press: 34 mmHg
TAPSE: 24.3 mm
TR max vel: 278 cm/s
Weight: 2720 oz

## 2016-10-25 LAB — TROPONIN I
TROPONIN I: 0.1 ng/mL — AB (ref <0.03)
TROPONIN I: 0.09 ng/mL — AB (ref <0.03)

## 2016-10-25 LAB — TSH: TSH: 1.287 u[IU]/mL (ref 0.350–4.500)

## 2016-10-25 LAB — CK: CK TOTAL: 2870 U/L — AB (ref 49–397)

## 2016-10-25 MED ORDER — INSULIN ASPART 100 UNIT/ML ~~LOC~~ SOLN
0.0000 [IU] | SUBCUTANEOUS | Status: DC
  Administered 2016-10-25 – 2016-10-26 (×2): 2 [IU] via SUBCUTANEOUS

## 2016-10-25 MED ORDER — DEXTROSE-NACL 5-0.9 % IV SOLN
INTRAVENOUS | Status: DC
  Administered 2016-10-25: 12:00:00 via INTRAVENOUS
  Administered 2016-10-26: 75 mL via INTRAVENOUS
  Administered 2016-10-26: 18:00:00 via INTRAVENOUS

## 2016-10-25 NOTE — Progress Notes (Signed)
  Echocardiogram 2D Echocardiogram has been performed.  Javier Murillo, Javier Murillo 10/25/2016, 9:05 AM

## 2016-10-25 NOTE — Progress Notes (Signed)
PROGRESS NOTE  Javier Murillo  ZOX:[id number] DOB: [birth year] DOA: 10/24/2016 PCP: Gaye AlkenBARNES,ELIZABETH STEWART, MD   Brief Narrative: Javier Murillo is a 82yo male with dementia, T2DM, HTN, and HLD [facility]. History at admission obtained by daughter and SIL who were contacted regarding lethargy for 1 day. Remeron had been started 3 days prior.  His reported baseline prior to this was conversant, ambulatory. On arrival, he was somnolent, snoring, in no distress. Labs significant for creatinine 1.5, BUN 35, AST 96, otherwise normal LFTs and ammonia. UA appeared noninfected with large hgb on dipstick and no RBCs on micro. CT head showed only chronic atrophy and microvascular disease. Troponin was 0.12 without anginal symptoms and ECG without diagnostic ischemic changes. He was admitted to the SDU for further work up of troponin elevation and somnolence. Troponin showed flat trend. CK was checked and noted to be 2,870.    Assessment & Plan: Principal Problem:   Acute encephalopathy Active Problems:   Acute renal failure (HCC)   Diabetes mellitus with neuropathy (HCC)   Elevated troponin  Acute rhabdomyolysis: Due to obtundation, immobility, possibly due to over sedation caused by new start remeron.  - IVF (echo pending) - Trend CK 2,870 >>  - Monitor Cr (baseline appears ~1.2) 1.58 on admission  Sedation: Possibly caused by remeron, last taken 11/17 around 8:00pm with t1/2 20-40 hours, expected to be on longer end due to renal impairment.  - Monitor while holding sedating medications including remeron.    AKI on CKD stage II: Baseline creatinine 1.17-1.25. Presumed prerenal due to dehydration/rhabdomyolysis in addition to lisinopril and lasix.  - Hold ACE and diuretic - Continue gentle hydration, echo pending.   Insulin-dependent diabetes mellitus: HbA1c 8.7% in Oct 2017.  - Hold toujeo and mealtime humalog -  will place on SSI q4hours    Elevated troponin: Due to decreased clearance and demand during rhabdomyolysis. No symptoms of angina. ECG without ischemic changes, troponin trend is flat. - Follow up echocardiogram.  Dementia - Close monitoring, sitter if needed  - PT/OT evaluation  HTN:  - Holding norvasc  DVT prophylaxis: Lovenox Code Status: [facility] Family Communication: None at bedside this AM, will check again in the afternoon. Disposition Plan: Continued inpatient treatment of encephalopathy due to rhabdomyolysis with IV fluids and cardiac work up.   Consultants:   None  Procedures:   Echo ordered  Antimicrobials:  None   Subjective: Pt disoriented. Denies pain.   Objective: Vitals:   10/25/16 0400 10/25/16 0600 10/25/16 0700 10/25/16 0800  BP: 125/62 135/67  (!) 112/56  Pulse: 66 66 60 (!) 57  Resp: 13 11 14 18   Temp:   97.8 F (36.6 C)   TempSrc:   Axillary   SpO2: 100% 100% 100% 100%  Weight:      Height:        Intake/Output Summary (Last 24 hours) at 10/25/16 0948 Last data filed at 10/25/16 0800  Gross per 24 hour  Intake             1070 ml  Output              600 ml  Net              470 ml   Filed Weights   10/24/16 1309  Weight: 77.1 kg (170 lb)    Examination: General exam: 82 y.o. male in no distress  Respiratory system: Non-labored breathing  with NRB, does not desaturate on room air. Clear to auscultation bilaterally.  Cardiovascular system: Regular rate and rhythm. No murmur, rub, or gallop. No JVD, and no pedal edema. Gastrointestinal system: Abdomen soft, non-tender, non-distended, with normoactive bowel sounds. No organomegaly or masses felt. Central nervous system: Somnolent and not cooperative with exam.  Extremities: Warm, no deformities Skin: No rashes, or ulcers on limited exam Psychiatry: Unable to determine.  Data Reviewed: I have personally reviewed following labs and imaging studies  CBC:  Recent Labs Lab  10/24/16 1350 10/25/16 0334  WBC 12.7* 10.3  NEUTROABS 8.8*  --   HGB 13.1 11.9*  HCT 40.0 36.2*  MCV 100.5* 100.8*  PLT 305 291   Basic Metabolic Panel:  Recent Labs Lab 10/24/16 1350 10/25/16 0334  NA 142 140  K 4.0 3.8  CL 106 108  CO2 28 28  GLUCOSE 79 121*  BUN 35* 30*  CREATININE 1.58* 1.22  CALCIUM 9.8 8.9   GFR: Estimated Creatinine Clearance: 48 mL/min (by C-G formula based on SCr of 1.22 mg/dL). Liver Function Tests:  Recent Labs Lab 10/24/16 1350  AST 96*  ALT 27  ALKPHOS 58  BILITOT 0.6  PROT 6.9  ALBUMIN 4.2   No results for input(s): LIPASE, AMYLASE in the last 168 hours.  Recent Labs Lab 10/24/16 1610  AMMONIA 9   Coagulation Profile: No results for input(s): INR, PROTIME in the last 168 hours. Cardiac Enzymes:  Recent Labs Lab 10/24/16 1350 10/24/16 2204 10/25/16 0331 10/25/16 0334  CKTOTAL  --   --  2,870*  --   TROPONINI 0.12* 0.11*  --  0.10*   BNP (last 3 results) No results for input(s): PROBNP in the last 8760 hours. HbA1C: No results for input(s): HGBA1C in the last 72 hours. CBG:  Recent Labs Lab 10/24/16 2152 10/25/16 0348 10/25/16 0757  GLUCAP 53* 110* 105*   Lipid Profile: No results for input(s): CHOL, HDL, LDLCALC, TRIG, CHOLHDL, LDLDIRECT in the last 72 hours. Thyroid Function Tests: No results for input(s): TSH, T4TOTAL, FREET4, T3FREE, THYROIDAB in the last 72 hours. Anemia Panel: No results for input(s): VITAMINB12, FOLATE, FERRITIN, TIBC, IRON, RETICCTPCT in the last 72 hours. Urine analysis:    Component Value Date/Time   COLORURINE YELLOW 10/24/2016 1346   APPEARANCEUR CLEAR 10/24/2016 1346   LABSPEC 1.019 10/24/2016 1346   PHURINE 5.5 10/24/2016 1346   GLUCOSEU NEGATIVE 10/24/2016 1346   GLUCOSEU >=1000 (A) 06/04/2016 1618   HGBUR LARGE (A) 10/24/2016 1346   BILIRUBINUR NEGATIVE 10/24/2016 1346   KETONESUR NEGATIVE 10/24/2016 1346   PROTEINUR NEGATIVE 10/24/2016 1346   UROBILINOGEN 0.2  06/04/2016 1618   NITRITE NEGATIVE 10/24/2016 1346   LEUKOCYTESUR NEGATIVE 10/24/2016 1346   Sepsis Labs: @LABRCNTIP (procalcitonin:4,lacticidven:4)  ) Recent Results (from the past 240 hour(s))  MRSA PCR Screening     Status: None   Collection Time: 10/24/16  6:32 PM  Result Value Ref Range Status   MRSA by PCR NEGATIVE NEGATIVE Final    Comment:        The GeneXpert MRSA Assay (FDA approved for NASAL specimens only), is one component of a comprehensive MRSA colonization surveillance program. It is not intended to diagnose MRSA infection nor to guide or monitor treatment for MRSA infections.      Radiology Studies: Ct Head Wo Contrast  Result Date: 10/24/2016 CLINICAL DATA:  Altered mental status. EXAM: CT HEAD WITHOUT CONTRAST TECHNIQUE: Contiguous axial images were obtained from the base of the skull through the vertex  without intravenous contrast. COMPARISON:  08/08/2016 FINDINGS: Brain: There is atrophy and chronic small vessel disease changes. No acute intracranial abnormality. Specifically, no hemorrhage, hydrocephalus, mass lesion, acute infarction, or significant intracranial injury. Vascular: No hyperdense vessel or unexpected calcification. Skull: No acute calvarial abnormality Sinuses/Orbits: Visualized paranasal sinuses and mastoids clear. Orbital soft tissues unremarkable. Other: None IMPRESSION: No acute intracranial abnormality. Atrophy, chronic microvascular disease. Electronically Signed   By: Charlett Nose M.D.   On: 10/24/2016 14:54    Scheduled Meds: . chlorhexidine  15 mL Mouth Rinse BID  . enoxaparin (LOVENOX) injection  40 mg Subcutaneous Q24H  . insulin aspart  0-9 Units Subcutaneous TID WC  . mouth rinse  15 mL Mouth Rinse q12n4p  . sodium chloride flush  3 mL Intravenous Q12H   Continuous Infusions: . sodium chloride    . sodium chloride 75 mL/hr (10/24/16 1741)  . dextrose 5 % and 0.45% NaCl 75 mL/hr at 10/24/16 2303     LOS: 1 day   Time  spent: 25 minutes.  Hazeline Junker, MD [facility] Pager [phone]  If 7PM-7AM, please contact night-coverage [email or web address] Password [email or web address] 10/25/2016, 9:48 AM

## 2016-10-26 DIAGNOSIS — B961 Klebsiella pneumoniae [K. pneumoniae] as the cause of diseases classified elsewhere: Secondary | ICD-10-CM

## 2016-10-26 DIAGNOSIS — M6282 Rhabdomyolysis: Principal | ICD-10-CM

## 2016-10-26 DIAGNOSIS — N390 Urinary tract infection, site not specified: Secondary | ICD-10-CM

## 2016-10-26 LAB — GLUCOSE, CAPILLARY
GLUCOSE-CAPILLARY: 108 mg/dL — AB (ref 65–99)
GLUCOSE-CAPILLARY: 101 mg/dL — AB (ref 65–99)
GLUCOSE-CAPILLARY: 191 mg/dL — AB (ref 65–99)
Glucose-Capillary: 154 mg/dL — ABNORMAL HIGH (ref 65–99)
Glucose-Capillary: 151 mg/dL — ABNORMAL HIGH (ref 65–99)
Glucose-Capillary: 283 mg/dL — ABNORMAL HIGH (ref 65–99)

## 2016-10-26 LAB — URINE CULTURE: Culture: 30000 — AB

## 2016-10-26 LAB — CBC
HEMATOCRIT: 39.8 % (ref 39.0–52.0)
HEMOGLOBIN: 12.9 g/dL — AB (ref 13.0–17.0)
MCH: 32.8 pg (ref 26.0–34.0)
MCHC: 32.4 g/dL (ref 30.0–36.0)
MCV: 101.3 fL — AB (ref 78.0–100.0)
PLATELETS: 303 10*3/uL (ref 150–400)
RBC: 3.93 MIL/uL — AB (ref 4.22–5.81)
RDW: 12.3 % (ref 11.5–15.5)
WBC: 13.2 10*3/uL — AB (ref 4.0–10.5)

## 2016-10-26 LAB — COMPREHENSIVE METABOLIC PANEL
ALT: 27 U/L (ref 17–63)
AST: 76 U/L — ABNORMAL HIGH (ref 15–41)
Albumin: 3.7 g/dL (ref 3.5–5.0)
Alkaline Phosphatase: 50 U/L (ref 38–126)
Anion gap: 7 (ref 5–15)
BUN: 17 mg/dL (ref 6–20)
CHLORIDE: 104 mmol/L (ref 101–111)
CO2: 27 mmol/L (ref 22–32)
CREATININE: 1.19 mg/dL (ref 0.61–1.24)
Calcium: 9.2 mg/dL (ref 8.9–10.3)
GFR, EST NON AFRICAN AMERICAN: 54 mL/min — AB (ref >60)
Glucose, Bld: 162 mg/dL — ABNORMAL HIGH (ref 65–99)
POTASSIUM: 3.9 mmol/L (ref 3.5–5.1)
SODIUM: 138 mmol/L (ref 135–145)
Total Bilirubin: 1 mg/dL (ref 0.3–1.2)
Total Protein: 6.2 g/dL — ABNORMAL LOW (ref 6.5–8.1)

## 2016-10-26 LAB — HEMOGLOBIN A1C
HEMOGLOBIN A1C: 7.6 % — AB (ref 4.8–5.6)
Mean Plasma Glucose: 171 mg/dL

## 2016-10-26 LAB — CK: CK TOTAL: 897 U/L — AB (ref 49–397)

## 2016-10-26 MED ORDER — INSULIN ASPART 100 UNIT/ML ~~LOC~~ SOLN
0.0000 [IU] | Freq: Three times a day (TID) | SUBCUTANEOUS | Status: DC
  Administered 2016-10-26: 2 [IU] via SUBCUTANEOUS
  Administered 2016-10-27: 4 [IU] via SUBCUTANEOUS
  Administered 2016-10-27: 3 [IU] via SUBCUTANEOUS

## 2016-10-26 MED ORDER — INSULIN ASPART 100 UNIT/ML ~~LOC~~ SOLN
0.0000 [IU] | Freq: Every day | SUBCUTANEOUS | Status: DC
  Administered 2016-10-26: 2 [IU] via SUBCUTANEOUS

## 2016-10-26 MED ORDER — TRAMADOL HCL 50 MG PO TABS: 50.0000 mg | ORAL_TABLET | Freq: Four times a day (QID) | ORAL | Status: DC | PRN

## 2016-10-26 MED ORDER — DEXTROSE 5 % IV SOLN
1.0000 g | INTRAVENOUS | Status: DC
  Administered 2016-10-26: 1 g via INTRAVENOUS
  Filled 2016-10-26 (×2): qty 10

## 2016-10-26 NOTE — Clinical Social Work Note (Signed)
Clinical Social Work Assessment  Patient Details  Name: Javier Murillo MRN: [id number] Date of Birth: Apr 04, 1930  Date of referral:  10/26/16               Reason for consult:  Facility Placement, Discharge Planning                Permission sought to share information with:  Facility Art therapist granted to share information::  Yes, Verbal Permission Granted  Name::        Agency::     Relationship::     Contact Information:     Housing/Transportation Living arrangements for the past 2 months:  [place] of Information:  Adult Children Patient Interpreter Needed:  None Criminal Activity/Legal Involvement Pertinent to Current Situation/Hospitalization:  No - Comment as needed Significant Relationships:  Adult Children, Other Family Members Lives with:  Facility Resident Do you feel safe going back to the place where you live?  Yes Need for family participation in patient care:  Yes (Comment)  Care giving concerns:  No concerns reported at this time.   Social Worker assessment / plan:  Pt hospitalized from [facility] on 10/24/16 with  Acute encephalopathy/somnolence. CSW met with pt's son in law to assist with d/c planning. Pt is unable to participate in D/C planning due to cognitive deficients. Family would like for pt to return to ALF at d/c. Clinicals have been sent to ALF for review. ALF will readmit provided pt returns to baseline. Pt was able to assist with ADLs. Pt used w/c in facility. CSW will continue to follow to assist with d/c planning needs.  Employment status:  Retired Nurse, adult PT Recommendations:  Not assessed at this time [place] / Referral to community resources:     Patient/Family's Response to care:  Family would like pt to return to [facility] at d/c.  Patient/Family's Understanding of and Emotional Response to Diagnosis, Current Treatment, and Prognosis:  Family  is aware of pt's medical status. " We we're trying to switch his daytime sleeping to nigh time sleeping. He became too sedated with the medication. " Family is hopeful pt will return to baseline and return to [facility]. Family appreciates CSW assistance.  Emotional Assessment Appearance:  Appears stated age Attitude/Demeanor/Rapport:  Other (cooperative) Affect (typically observed):  Calm Orientation:  Oriented to Self Alcohol / Substance use:  Not Applicable Psych involvement (Current and /or in the community):  No (Comment)  Discharge Needs  Concerns to be addressed:  Discharge Planning Concerns Readmission within the last 30 days:  No Current discharge risk:  None Barriers to Discharge:  No Barriers Identified   Loraine Maple  [phone] 10/26/2016, 12:59 PM

## 2016-10-26 NOTE — Progress Notes (Addendum)
Pharmacy Antibiotic Note  Javier Murillo is a 82 y.o. male presented on 10/24/2016 with somnolence and lethargy.  UCx positive, and Pharmacy has been consulted for Rocephin dosing for UTI.  Plan:  Rocephin 1g IV q24 hr  F/u UCx, if same pathogen as previous Cx, can narrow to 1G cephalosporin  Height: 5\' 11"  (180.3 cm) Weight: 170 lb (77.1 kg) IBW/kg (Calculated) : 75.3  Temp (24hrs), Avg:98.4 F (36.9 C), Min:97.2 F (36.2 C), Max:99.9 F (37.7 C)   Recent Labs Lab 10/24/16 1350 10/25/16 0334 10/26/16 0328  WBC 12.7* 10.3 13.2*  CREATININE 1.58* 1.22 1.19    Estimated Creatinine Clearance: 49.2 mL/min (by C-G formula based on SCr of 1.19 mg/dL).    Allergies  Allergen Reactions  . Fluvirin [Influenza Virus Vaccine Split] Other (See Comments)    Gave patient flu    Antimicrobials this admission: 11/20 Rocephin >>   Dose adjustments this admission: ---  Microbiology results: 11/20 UCx: >100k Kleb pneumo; sens pending  11/18 UCx: 30k Kleb pneumo R-Amp, Unasyn, NTF, I-Zosyn, S-Ancef, Cipro   Thank you for allowing pharmacy to be a part of this patient's care.  Bernadene Personrew [place], PharmD, BCPS Pager: [phone] 10/26/2016, 2:33 PM

## 2016-10-26 NOTE — Progress Notes (Signed)
PROGRESS NOTE  Javier Murillo  LKG:[id number] DOB: 18-Feb-1930 DOA: 10/24/2016 PCP: Gaye Alken, MD   Brief Narrative: Javier Murillo is a 82yo male with dementia, T2DM, HTN, and HLD brought from [facility] for lethargy and somnolence. History at admission obtained by daughter and SIL who were contacted regarding lethargy for 1 day. Remeron had been started 3 days prior.  His reported baseline prior to this was conversant, ambulatory. On arrival, he was somnolent, snoring, in no distress. Labs significant for creatinine 1.5, BUN 35, AST 96, otherwise normal LFTs and ammonia. UA appeared noninfected with large hgb on dipstick and no RBCs on micro. CT head showed only chronic atrophy and microvascular disease. Troponin was 0.12 without anginal symptoms and ECG without diagnostic ischemic changes. He was admitted to the SDU for further work up of troponin elevation and somnolence. Troponin showed flat trend. CK was checked and noted to be 2,870.    Assessment & Plan: Principal Problem:   Rhabdomyolysis Active Problems:   Acute renal failure (HCC)   Acute encephalopathy   Diabetes mellitus with neuropathy (HCC)   Elevated troponin  Acute rhabdomyolysis: Due to obtundation, immobility, possibly due to over sedation caused by new start remeron.  - IVF (echo pending) - Trend CK 2,870 >> 897 - Monitor Cr - Improving - transfer to floor without telemetry.   Klebsiella UTI: >100k CFU's on collected specimen 11/19.  - Based on sensitivities from urine that grew only 30k CFU's 11/18, will start CTX pending sensitivities - ?cause of leukocytosis (10.3 > 13.2), will monitor.  Sedation: Resolved. Possibly caused by remeron, last taken 11/17 around 8:00pm with t1/2 20-40 hours, expected to be on longer end due to renal impairment.  - Discontinue remeron.    Dementia with acute delirium overnight 11/19: Now at mental baseline. Anticipate waxing/waning delirium to continue  throughout hospitalization, so will attempt to minimize LOS. TSH wnl. - Close monitoring, sitter if needed  - For ongoing behavioral/mood disorder will hold remeron and not restart fluoxetine at this time. Will follow up with PCP for treatment of depression/insomnia, could cautiously consider trazodone.   AKI (resolved) on CKD stage II: Baseline creatinine 1.17-1.25. Presumed prerenal due to dehydration/rhabdomyolysis in addition to lisinopril and lasix.  - Hold ACE and diuretic - Continue gentle hydration for CK clearance.  Insulin-dependent diabetes mellitus: HbA1c 8.7% in Oct 2017 > rechecked 11/18 and is 7.6%.  - Hold toujeo and mealtime humalog -  will place on SSI AC/HS until taking po reliably.  Elevated troponin: Due to decreased clearance and demand during rhabdomyolysis. No symptoms of angina. ECG without ischemic changes, troponin trend is flat. Echocardiogram without regional wall motion abnormalities.  - Continue to monitor clinically.   HTN:  - Holding norvasc  Macrocytic anemia: Mild, presumed chronic and at baseline hgb 11-12.  - Check folate, B12 - Monitor  DVT prophylaxis: Lovenox Code Status: Full confirmed at admission by HPOA Family Communication: Son-in-law at bedside.  Disposition Plan: Continued inpatient treatment of rhabdomyolysis with IV fluids. Anticipate D/C back to ALF in 24 hours.   Consultants:   None  Procedures:   Echocardiogram 11/19:  Study Conclusions  - Left ventricle: The cavity size was normal. Wall thickness was   normal. Systolic function was normal. The estimated ejection   fraction was in the range of 60% to 65%. Wall motion was normal;   there were no regional wall motion abnormalities. Doppler   parameters are consistent with abnormal left ventricular   relaxation (  grade 1 diastolic dysfunction). - Mitral valve: There was mild regurgitation. - Pulmonary arteries: Systolic pressure was mildly increased. PA   peak pressure: 34  mm Hg (S).  Impressions:  - Normal LV systolic function; grade 1 diastolic dysfunction; mild   MR; mild TR; mildly elevated pulmonary pressure.  Antimicrobials:  Rocephin 11/20 >>   Subjective: Pt disoriented, did not sleep last night per staff. Son-in-law at bedside reporting he always has hospital delirium and he looks much better than admission.   Objective: Vitals:   10/26/16 0900 10/26/16 0930 10/26/16 1000 10/26/16 1200  BP:    (!) 153/68  Pulse:  79 80 83  Resp: 16 19 16 13   Temp:    99.9 F (37.7 C)  TempSrc:    Axillary  SpO2:  97% 99% 99%  Weight:      Height:        Intake/Output Summary (Last 24 hours) at 10/26/16 1357 Last data filed at 10/26/16 1226  Gross per 24 hour  Intake             1875 ml  Output             1225 ml  Net              650 ml   Filed Weights   10/24/16 1309  Weight: 77.1 kg (170 lb)    Examination: General exam: Elderly male conversant but confused in no distress  Respiratory system: Non-labored breathing on room air. Clear to auscultation bilaterally.  Cardiovascular system: Regular rate and rhythm. No murmur, rub, or gallop. No JVD, and no pedal edema. Gastrointestinal system: Abdomen soft, non-tender, non-distended, with normoactive bowel sounds. No suprapubic tenderness. No organomegaly or masses felt. Central nervous system: Alert, disoriented. No focal deficits.   Extremities: Warm, no deformities Skin: No rashes, or ulcers on limited exam Psychiatry: Unable to determine.  Data Reviewed: I have personally reviewed following labs and imaging studies  CBC:  Recent Labs Lab 10/24/16 1350 10/25/16 0334 10/26/16 0328  WBC 12.7* 10.3 13.2*  NEUTROABS 8.8*  --   --   HGB 13.1 11.9* 12.9*  HCT 40.0 36.2* 39.8  MCV 100.5* 100.8* 101.3*  PLT 305 291 303   Basic Metabolic Panel:  Recent Labs Lab 10/24/16 1350 10/25/16 0334 10/26/16 0328  NA 142 140 138  K 4.0 3.8 3.9  CL 106 108 104  CO2 28 28 27   GLUCOSE 79  121* 162*  BUN 35* 30* 17  CREATININE 1.58* 1.22 1.19  CALCIUM 9.8 8.9 9.2   GFR: Estimated Creatinine Clearance: 49.2 mL/min (by C-G formula based on SCr of 1.19 mg/dL). Liver Function Tests:  Recent Labs Lab 10/24/16 1350 10/26/16 0328  AST 96* 76*  ALT 27 27  ALKPHOS 58 50  BILITOT 0.6 1.0  PROT 6.9 6.2*  ALBUMIN 4.2 3.7   No results for input(s): LIPASE, AMYLASE in the last 168 hours.  Recent Labs Lab 10/24/16 1610  AMMONIA 9   Coagulation Profile: No results for input(s): INR, PROTIME in the last 168 hours. Cardiac Enzymes:  Recent Labs Lab 10/24/16 1350 10/24/16 2204 10/25/16 0331 10/25/16 0334 10/25/16 0915 10/26/16 0328  CKTOTAL  --   --  2,870*  --   --  897*  TROPONINI 0.12* 0.11*  --  0.10* 0.09*  --    BNP (last 3 results) No results for input(s): PROBNP in the last 8760 hours. HbA1C:  Recent Labs  10/24/16 2204  HGBA1C 7.6*  CBG:  Recent Labs Lab 10/25/16 1611 10/25/16 2121 10/26/16 0331 10/26/16 0734 10/26/16 1213  GLUCAP 147* 171* 151* 108* 101*   Lipid Profile: No results for input(s): CHOL, HDL, LDLCALC, TRIG, CHOLHDL, LDLDIRECT in the last 72 hours. Thyroid Function Tests:  Recent Labs  10/25/16 0915  TSH 1.287   Anemia Panel: No results for input(s): VITAMINB12, FOLATE, FERRITIN, TIBC, IRON, RETICCTPCT in the last 72 hours. Urine analysis:    Component Value Date/Time   COLORURINE YELLOW 10/24/2016 1346   APPEARANCEUR CLEAR 10/24/2016 1346   LABSPEC 1.019 10/24/2016 1346   PHURINE 5.5 10/24/2016 1346   GLUCOSEU NEGATIVE 10/24/2016 1346   GLUCOSEU >=1000 (A) 06/04/2016 1618   HGBUR LARGE (A) 10/24/2016 1346   BILIRUBINUR NEGATIVE 10/24/2016 1346   KETONESUR NEGATIVE 10/24/2016 1346   PROTEINUR NEGATIVE 10/24/2016 1346   UROBILINOGEN 0.2 06/04/2016 1618   NITRITE NEGATIVE 10/24/2016 1346   LEUKOCYTESUR NEGATIVE 10/24/2016 1346   Sepsis Labs: @LABRCNTIP (procalcitonin:4,lacticidven:4)  ) Recent Results (from  the past 240 hour(s))  Urine culture     Status: Abnormal   Collection Time: 10/24/16  1:46 PM  Result Value Ref Range Status   Specimen Description URINE, RANDOM  Final   Special Requests NONE  Final   Culture 30,000 COLONIES/mL KLEBSIELLA PNEUMONIAE (A)  Final   Report Status 10/26/2016 FINAL  Final   Organism ID, Bacteria KLEBSIELLA PNEUMONIAE (A)  Final      Susceptibility   Klebsiella pneumoniae - MIC*    AMPICILLIN >=32 RESISTANT Resistant     CEFAZOLIN <=4 SENSITIVE Sensitive     CEFTRIAXONE <=1 SENSITIVE Sensitive     CIPROFLOXACIN <=0.25 SENSITIVE Sensitive     GENTAMICIN <=1 SENSITIVE Sensitive     IMIPENEM <=0.25 SENSITIVE Sensitive     NITROFURANTOIN 128 RESISTANT Resistant     TRIMETH/SULFA <=20 SENSITIVE Sensitive     AMPICILLIN/SULBACTAM >=32 RESISTANT Resistant     PIP/TAZO 32 INTERMEDIATE Intermediate     Extended ESBL NEGATIVE Sensitive     * 30,000 COLONIES/mL KLEBSIELLA PNEUMONIAE  MRSA PCR Screening     Status: None   Collection Time: 10/24/16  6:32 PM  Result Value Ref Range Status   MRSA by PCR NEGATIVE NEGATIVE Final    Comment:        The GeneXpert MRSA Assay (FDA approved for NASAL specimens only), is one component of a comprehensive MRSA colonization surveillance program. It is not intended to diagnose MRSA infection nor to guide or monitor treatment for MRSA infections.   Urine culture     Status: Abnormal (Preliminary result)   Collection Time: 10/25/16  6:16 AM  Result Value Ref Range Status   Specimen Description URINE, RANDOM  Final   Special Requests NONE  Final   Culture >=100,000 COLONIES/mL KLEBSIELLA PNEUMONIAE (A)  Final   Report Status PENDING  Incomplete    Radiology Studies: Ct Head Wo Contrast  Result Date: 10/24/2016 CLINICAL DATA:  Altered mental status. EXAM: CT HEAD WITHOUT CONTRAST TECHNIQUE: Contiguous axial images were obtained from the base of the skull through the vertex without intravenous contrast. COMPARISON:   08/08/2016 FINDINGS: Brain: There is atrophy and chronic small vessel disease changes. No acute intracranial abnormality. Specifically, no hemorrhage, hydrocephalus, mass lesion, acute infarction, or significant intracranial injury. Vascular: No hyperdense vessel or unexpected calcification. Skull: No acute calvarial abnormality Sinuses/Orbits: Visualized paranasal sinuses and mastoids clear. Orbital soft tissues unremarkable. Other: None IMPRESSION: No acute intracranial abnormality. Atrophy, chronic microvascular disease. Electronically Signed   By: Caryn BeeKevin  Dover M.D.   On: 10/24/2016 14:54   Scheduled Meds: . cefTRIAXone (ROCEPHIN)  IV  1 g Intravenous Q24H  . chlorhexidine  15 mL Mouth Rinse BID  . enoxaparin (LOVENOX) injection  40 mg Subcutaneous Q24H  . insulin aspart  0-9 Units Subcutaneous Q4H  . mouth rinse  15 mL Mouth Rinse q12n4p  . sodium chloride flush  3 mL Intravenous Q12H   Continuous Infusions: . dextrose 5 % and 0.9% NaCl 75 mL/hr at 10/26/16 1200    LOS: 2 days   Time spent: 25 minutes.  Hazeline Junkeryan Grunz, MD [facility] Pager [phone]  If 7PM-7AM, please contact night-coverage [email or web address] Password [email or web address] 10/26/2016, 1:57 PM

## 2016-10-27 DIAGNOSIS — N390 Urinary tract infection, site not specified: Secondary | ICD-10-CM | POA: Diagnosis present

## 2016-10-27 DIAGNOSIS — B961 Klebsiella pneumoniae [K. pneumoniae] as the cause of diseases classified elsewhere: Secondary | ICD-10-CM

## 2016-10-27 LAB — BASIC METABOLIC PANEL
ANION GAP: 6 (ref 5–15)
BUN: 20 mg/dL (ref 6–20)
CO2: 26 mmol/L (ref 22–32)
Calcium: 8.8 mg/dL — ABNORMAL LOW (ref 8.9–10.3)
Chloride: 102 mmol/L (ref 101–111)
Creatinine, Ser: 1.31 mg/dL — ABNORMAL HIGH (ref 0.61–1.24)
GFR calc Af Amer: 56 mL/min — ABNORMAL LOW (ref >60)
GFR, EST NON AFRICAN AMERICAN: 48 mL/min — AB (ref >60)
GLUCOSE: 274 mg/dL — AB (ref 65–99)
POTASSIUM: 3.9 mmol/L (ref 3.5–5.1)
Sodium: 134 mmol/L — ABNORMAL LOW (ref 135–145)

## 2016-10-27 LAB — GLUCOSE, CAPILLARY
GLUCOSE-CAPILLARY: 274 mg/dL — AB (ref 65–99)
Glucose-Capillary: 219 mg/dL — ABNORMAL HIGH (ref 65–99)

## 2016-10-27 LAB — URINE CULTURE: Culture: >=100000 — AB

## 2016-10-27 LAB — CBC
HEMATOCRIT: 37.3 % — AB (ref 39.0–52.0)
HEMOGLOBIN: 12.2 g/dL — AB (ref 13.0–17.0)
MCH: 32.7 pg (ref 26.0–34.0)
MCHC: 32.7 g/dL (ref 30.0–36.0)
MCV: 100 fL (ref 78.0–100.0)
Platelets: 252 10*3/uL (ref 150–400)
RBC: 3.73 MIL/uL — ABNORMAL LOW (ref 4.22–5.81)
RDW: 12.2 % (ref 11.5–15.5)
WBC: 14.9 10*3/uL — AB (ref 4.0–10.5)

## 2016-10-27 LAB — CK: Total CK: 399 U/L — ABNORMAL HIGH (ref 49–397)

## 2016-10-27 LAB — VITAMIN B12: VITAMIN B 12: 342 pg/mL (ref 180–914)

## 2016-10-27 MED ORDER — INSULIN GLARGINE 100 UNIT/ML ~~LOC~~ SOLN
12.0000 [IU] | Freq: Every day | SUBCUTANEOUS | Status: DC
  Administered 2016-10-27: 12 [IU] via SUBCUTANEOUS
  Filled 2016-10-27: qty 0.12

## 2016-10-27 MED ORDER — ACETAMINOPHEN 500 MG PO TABS: 500.0000 mg | ORAL_TABLET | Freq: Four times a day (QID) | ORAL | 0 refills | Status: AC | PRN

## 2016-10-27 MED ORDER — CEFUROXIME AXETIL 250 MG PO TABS: 250.0000 mg | ORAL_TABLET | Freq: Two times a day (BID) | ORAL | 0 refills | Status: DC

## 2016-10-27 MED ORDER — CEFUROXIME AXETIL 250 MG PO TABS
250.0000 mg | ORAL_TABLET | Freq: Two times a day (BID) | ORAL | Status: DC
  Filled 2016-10-27: qty 1

## 2016-10-27 MED ORDER — CEPHALEXIN 500 MG PO CAPS: 500.0000 mg | ORAL_CAPSULE | Freq: Two times a day (BID) | ORAL | Status: DC

## 2016-10-27 NOTE — Discharge Summary (Addendum)
Physician Discharge Summary  Javier Murillo RUE:[id number] DOB: 13-Mar-1930 DOA: 10/24/2016  PCP: Gaye AlkenBARNES,ELIZABETH STEWART, MD  Admit date: 10/24/2016 Discharge date: 10/27/2016  [facility]   Recommendations for Outpatient Follow-up:  - Follow up with PCP in 1-2 weeks: Held remeron and did not restart fluoxetine at this time. Needs follow up for treatment of depression/insomnia, could cautiously consider trazodone.  - Monitor for signs of sepsis. UTI was diagnosed and treated with rocephin x1, then cefuroxime 250mg  BID based on sensitivities (as below) x6 days to complete 7 days (last dose 11/26 PM).  - Please obtain BMP and CBC to monitor renal function (discharge creatinine near baseline at 1.31) and leukocytosis (discharge WBC 14.9).  - Patient has multiple presentations for decreased interactivity/limited arousal, with an episode during hospitalization which was resolved with significant verbal and tactile stimulation. Suspect this is due to untreated (pt not able to tolerate face mask) OSA. It's possible remeron worked in helping with sleep and diminished arousal was from acutely worsened hypoxemia during prolonged sleep.   Home Health: Physical therapy and occupational therapy Equipment/Devices: None Discharge Condition: Stable CODE STATUS: Full Diet recommendation: CCHO: Carbohydrate limited  Brief/Interim Summary: Javier SerRobert L Quayle is a 82yo male with dementia, T2DM, HTN, and HLD [facility] and [facility]nolence. History at admission obtained by daughter and SIL who were contacted regarding lethargy for 1 day. Remeron had been started 3 days prior.  His reported baseline prior to this was conversant, ambulatory. On arrival, he was somnolent, snoring, in no distress. Labs significant for creatinine 1.5, BUN 35, AST 96, otherwise normal LFTs and ammonia. UA appeared noninfected with large hgb on dipstick and no  RBCs on micro. CT head showed only chronic atrophy and microvascular disease. Troponin was 0.12 without anginal symptoms and ECG without diagnostic ischemic changes. He was admitted to the SDU for further work up of troponin elevation and somnolence. Troponin showed flat trend, echocardiogram showed no wall motion abnormalities. CK was checked and noted to be 2,870 which was treated with IV fluids, decreasing to 399 on 11/21. His mental status improved to baseline and he was transferred to the medical floor 11/20. On 11/21, he remains stable and is clear for discharge to complete antibiotics for UTI (see below).   Discharge Diagnoses:  Principal Problem:   Rhabdomyolysis Active Problems:   Acute renal failure (HCC)   Acute encephalopathy   Diabetes mellitus with neuropathy (HCC)   Elevated troponin   UTI due to Klebsiella species  Acute rhabdomyolysis: Due to obtundation, immobility, possibly due to over sedation caused by new start remeron.  - IVF (echo pending) - Trend CK 2,870 >> 897 >> 399 - Monitor Cr - Improving - transfer to floor without telemetry.   Klebsiella UTI: >100k CFU's on collected specimen 11/19.  - Based on sensitivities, gave CTX x1, now transition to cefuroxime for total of 7 days. - ?cause of leukocytosis (10.3 > 14.9). Afebrile, no distress, AMS resolved. Considering pathogen is known and antibiotics are known to be targeted toward pathogen, ok to discharge with close follow up.  Sedation: Resolved. Possibly caused by remeron, last taken 11/17 around 8:00pm with t1/2 20-40 hours, expected to be on longer end due to renal impairment.  - Discontinue remeron.    Dementia with acute delirium overnight 11/19: Now at mental baseline. Anticipate waxing/waning delirium to continue throughout hospitalization, so will attempt to minimize LOS. TSH wnl. - Close monitoring, sitter if needed  -  For ongoing behavioral/mood disorder will hold remeron and not restart fluoxetine at  this time. Will follow up with PCP for treatment of depression/insomnia, could cautiously consider trazodone.   AKI (resolved) on CKD stage II: Baseline creatinine 1.17-1.25. Presumed prerenal due to dehydration/rhabdomyolysis in addition to lisinopril and lasix.  - Hold ACE and diuretic - Continue gentle hydration for CK clearance.  Insulin-dependent diabetes mellitus: HbA1c 8.7% in Oct 2017 > rechecked 11/18 and is 7.6%.  - Hold toujeo and mealtime humalog - Placed on SSI until eating. Now that reliably taking po, CBGs up, so will restart lantus while in hospital and home regimen at discharge.  Elevated troponin: Due to decreased clearance and demand during rhabdomyolysis. No symptoms of angina. ECG without ischemic changes, troponin trend is flat. Echocardiogram without regional wall motion abnormalities.  - Continue to monitor clinically.   HTN:  - Holding norvasc  Macrocytic anemia: Mild, presumed chronic and at baseline hgb 11-12.  - Check folate (pending at discharge), B12 (normal at 342) - Monitor  Discharge Instructions Discharge Instructions    Discharge instructions    Complete by:  As directed    SEE DISCHARGE SUMMARY       Medication List    STOP taking these medications   mirtazapine 15 MG tablet Commonly known as:  REMERON     TAKE these medications   acetaminophen 500 MG tablet Commonly known as:  TYLENOL Take 1 tablet (500 mg total) by mouth every 6 (six) hours as needed (pain). What changed:  reasons to take this   amLODipine 5 MG tablet Commonly known as:  NORVASC Take 1 tablet (5 mg total) by mouth daily.   cefUROXime 250 MG tablet Commonly known as:  CEFTIN Take 1 tablet (250 mg total) by mouth 2 (two) times daily with a meal.   cholecalciferol 1000 units tablet Commonly known as:  VITAMIN D Take 1,000 Units by mouth every evening.   ferrous sulfate 325 (65 FE) MG tablet Take 325 mg by mouth daily with breakfast.   furosemide 20 MG  tablet Commonly known as:  LASIX Take 1 tablet (20 mg total) by mouth daily.   HUMALOG 100 UNIT/ML injection Generic drug:  insulin lispro Inject 7-9 Units into the skin 2 (two) times daily. Sliding scale- if 0-200= 7 units, 201-249=8 units, 250+=9 units   lisinopril 10 MG tablet Commonly known as:  PRINIVIL,ZESTRIL Take 1 tablet (10 mg total) by mouth daily.   TOUJEO SOLOSTAR 300 UNIT/ML Sopn Generic drug:  Insulin Glargine Inject 24 Units into the skin daily.      Follow-up Information    Gaye Alken, MD Follow up.   Specialty:  Family Medicine Contact information: [address] [place] [address] [phone]          Allergies  Allergen Reactions  . Fluvirin [Influenza Virus Vaccine Split] Other (See Comments)    Gave patient flu    Consultations:  None  Procedures/Studies: Ct Head Wo Contrast  Result Date: 10/24/2016 CLINICAL DATA:  Altered mental status. EXAM: CT HEAD WITHOUT CONTRAST TECHNIQUE: Contiguous axial images were obtained from the base of the skull through the vertex without intravenous contrast. COMPARISON:  08/08/2016 FINDINGS: Brain: There is atrophy and chronic small vessel disease changes. No acute intracranial abnormality. Specifically, no hemorrhage, hydrocephalus, mass lesion, acute infarction, or significant intracranial injury. Vascular: No hyperdense vessel or unexpected calcification. Skull: No acute calvarial abnormality Sinuses/Orbits: Visualized paranasal sinuses and mastoids clear. Orbital soft tissues unremarkable. Other: None IMPRESSION:  No acute intracranial abnormality. Atrophy, chronic microvascular disease. Electronically Signed   By: Charlett NoseKevin  Dover M.D.   On: 10/24/2016 14:54   Echocardiogram 11/19:  Study Conclusions  - Left ventricle: The cavity size was normal. Wall thickness was normal. Systolic function was normal. The estimated ejection fraction was in the range of 60% to 65%. Wall motion was  normal; there were no regional wall motion abnormalities. Doppler parameters are consistent with abnormal left ventricular relaxation (grade 1 diastolic dysfunction). - Mitral valve: There was mild regurgitation. - Pulmonary arteries: Systolic pressure was mildly increased. PA peak pressure: 34 mm Hg (S).  Impressions:  - Normal LV systolic function; grade 1 diastolic dysfunction; mild MR; mild TR; mildly elevated pulmonary pressure.  Subjective: Pt pleasantly disoriented, initially very sleepy an ddifficult to arouse this morning. But once annoyed by loud verbal stimuli he is more animated, conversant, and able to take food and medications. No family at bedside. No fevers overnight. Ate all of dinner.  Discharge Exam: Vitals:   10/26/16 2141 10/27/16 0532  BP: (!) 126/92 (!) 146/61  Pulse: 92 79  Resp: 18 18  Temp: 98.2 F (36.8 C) 100.1 F (37.8 C)   Vitals:   10/26/16 1514 10/26/16 2141 10/27/16 0532 10/27/16 1105  BP: (!) 161/80 (!) 126/92 (!) 146/61   Pulse: 79 92 79   Resp: 16 18 18    Temp: 97.5 F (36.4 C) 98.2 F (36.8 C) 100.1 F (37.8 C)   TempSrc: Axillary Oral Axillary   SpO2: 94% 98% 94% 96%  Weight: 76.5 kg (168 lb 10.4 oz)     Height: 5\' 7"  (1.702 m)      General: Elderly, conversant but confused male in no distress Cardiovascular: RRR, S1/S2 +, no rubs, no gallops Respiratory: Nonlabored, CTA bilaterally, no wheezing, no rhonchi GU: Condom cath in situ Abdominal: Soft, nontender and no suprapubic tenderness, ND, bowel sounds +, no CVA tenderness Extremities: no edema, no cyanosis Neuro: Disoriented, nonfocal.  The results of significant diagnostics from this hospitalization (including imaging, microbiology, ancillary and laboratory) are listed below for reference.    Microbiology: Recent Results (from the past 240 hour(s))  Urine culture     Status: Abnormal   Collection Time: 10/24/16  1:46 PM  Result Value Ref Range Status    Specimen Description URINE, RANDOM  Final   Special Requests NONE  Final   Culture 30,000 COLONIES/mL KLEBSIELLA PNEUMONIAE (A)  Final   Report Status 10/26/2016 FINAL  Final   Organism ID, Bacteria KLEBSIELLA PNEUMONIAE (A)  Final      Susceptibility   Klebsiella pneumoniae - MIC*    AMPICILLIN >=32 RESISTANT Resistant     CEFAZOLIN <=4 SENSITIVE Sensitive     CEFTRIAXONE <=1 SENSITIVE Sensitive     CIPROFLOXACIN <=0.25 SENSITIVE Sensitive     GENTAMICIN <=1 SENSITIVE Sensitive     IMIPENEM <=0.25 SENSITIVE Sensitive     NITROFURANTOIN 128 RESISTANT Resistant     TRIMETH/SULFA <=20 SENSITIVE Sensitive     AMPICILLIN/SULBACTAM >=32 RESISTANT Resistant     PIP/TAZO 32 INTERMEDIATE Intermediate     Extended ESBL NEGATIVE Sensitive     * 30,000 COLONIES/mL KLEBSIELLA PNEUMONIAE  MRSA PCR Screening     Status: None   Collection Time: 10/24/16  6:32 PM  Result Value Ref Range Status   MRSA by PCR NEGATIVE NEGATIVE Final    Comment:        The GeneXpert MRSA Assay (FDA approved for NASAL specimens only), is one component  of a comprehensive MRSA colonization surveillance program. It is not intended to diagnose MRSA infection nor to guide or monitor treatment for MRSA infections.   Urine culture     Status: Abnormal   Collection Time: 10/25/16  6:16 AM  Result Value Ref Range Status   Specimen Description URINE, RANDOM  Final   Special Requests NONE  Final   Culture >=100,000 COLONIES/mL KLEBSIELLA PNEUMONIAE (A)  Final   Report Status 10/27/2016 FINAL  Final   Organism ID, Bacteria KLEBSIELLA PNEUMONIAE (A)  Final      Susceptibility   Klebsiella pneumoniae - MIC*    AMPICILLIN >=32 RESISTANT Resistant     CEFAZOLIN <=4 SENSITIVE Sensitive     CEFTRIAXONE <=1 SENSITIVE Sensitive     CIPROFLOXACIN <=0.25 SENSITIVE Sensitive     GENTAMICIN <=1 SENSITIVE Sensitive     IMIPENEM <=0.25 SENSITIVE Sensitive     NITROFURANTOIN 64 INTERMEDIATE Intermediate     TRIMETH/SULFA <=20  SENSITIVE Sensitive     AMPICILLIN/SULBACTAM >=32 RESISTANT Resistant     PIP/TAZO 32 INTERMEDIATE Intermediate     Extended ESBL NEGATIVE Sensitive     * >=100,000 COLONIES/mL KLEBSIELLA PNEUMONIAE     Labs: BNP (last 3 results)  Recent Labs  02/02/16 1215  BNP 24.5   Basic Metabolic Panel:  Recent Labs Lab 10/24/16 1350 10/25/16 0334 10/26/16 0328 10/27/16 0404  NA 142 140 138 134*  K 4.0 3.8 3.9 3.9  CL 106 108 104 102  CO2 28 28 27 26   GLUCOSE 79 121* 162* 274*  BUN 35* 30* 17 20  CREATININE 1.58* 1.22 1.19 1.31*  CALCIUM 9.8 8.9 9.2 8.8*   Liver Function Tests:  Recent Labs Lab 10/24/16 1350 10/26/16 0328  AST 96* 76*  ALT 27 27  ALKPHOS 58 50  BILITOT 0.6 1.0  PROT 6.9 6.2*  ALBUMIN 4.2 3.7   No results for input(s): LIPASE, AMYLASE in the last 168 hours.  Recent Labs Lab 10/24/16 1610  AMMONIA 9   CBC:  Recent Labs Lab 10/24/16 1350 10/25/16 0334 10/26/16 0328 10/27/16 0404  WBC 12.7* 10.3 13.2* 14.9*  NEUTROABS 8.8*  --   --   --   HGB 13.1 11.9* 12.9* 12.2*  HCT 40.0 36.2* 39.8 37.3*  MCV 100.5* 100.8* 101.3* 100.0  PLT 305 291 303 252   Cardiac Enzymes:  Recent Labs Lab 10/24/16 1350 10/24/16 2204 10/25/16 0331 10/25/16 0334 10/25/16 0915 10/26/16 0328 10/27/16 0404  CKTOTAL  --   --  2,870*  --   --  897* 399*  TROPONINI 0.12* 0.11*  --  0.10* 0.09*  --   --    BNP: Invalid input(s): POCBNP CBG:  Recent Labs Lab 10/26/16 1213 10/26/16 1751 10/26/16 2136 10/27/16 0745 10/27/16 1206  GLUCAP 101* 191* 283* 274* 219*   D-Dimer No results for input(s): DDIMER in the last 72 hours. Hgb A1c  Recent Labs  10/24/16 2204  HGBA1C 7.6*   Lipid Profile No results for input(s): CHOL, HDL, LDLCALC, TRIG, CHOLHDL, LDLDIRECT in the last 72 hours. Thyroid function studies  Recent Labs  10/25/16 0915  TSH 1.287   Anemia work up  Recent Labs  10/27/16 0404  VITAMINB12 342   Urinalysis    Component Value  Date/Time   COLORURINE YELLOW 10/24/2016 1346   APPEARANCEUR CLEAR 10/24/2016 1346   LABSPEC 1.019 10/24/2016 1346   PHURINE 5.5 10/24/2016 1346   GLUCOSEU NEGATIVE 10/24/2016 1346   GLUCOSEU >=1000 (A) 06/04/2016 1618   HGBUR  LARGE (A) 10/24/2016 1346   BILIRUBINUR NEGATIVE 10/24/2016 1346   KETONESUR NEGATIVE 10/24/2016 1346   PROTEINUR NEGATIVE 10/24/2016 1346   UROBILINOGEN 0.2 06/04/2016 1618   NITRITE NEGATIVE 10/24/2016 1346   LEUKOCYTESUR NEGATIVE 10/24/2016 1346   Sepsis Labs Invalid input(s): PROCALCITONIN,  WBC,  LACTICIDVEN Microbiology Recent Results (from the past 240 hour(s))  Urine culture     Status: Abnormal   Collection Time: 10/24/16  1:46 PM  Result Value Ref Range Status   Specimen Description URINE, RANDOM  Final   Special Requests NONE  Final   Culture 30,000 COLONIES/mL KLEBSIELLA PNEUMONIAE (A)  Final   Report Status 10/26/2016 FINAL  Final   Organism ID, Bacteria KLEBSIELLA PNEUMONIAE (A)  Final      Susceptibility   Klebsiella pneumoniae - MIC*    AMPICILLIN >=32 RESISTANT Resistant     CEFAZOLIN <=4 SENSITIVE Sensitive     CEFTRIAXONE <=1 SENSITIVE Sensitive     CIPROFLOXACIN <=0.25 SENSITIVE Sensitive     GENTAMICIN <=1 SENSITIVE Sensitive     IMIPENEM <=0.25 SENSITIVE Sensitive     NITROFURANTOIN 128 RESISTANT Resistant     TRIMETH/SULFA <=20 SENSITIVE Sensitive     AMPICILLIN/SULBACTAM >=32 RESISTANT Resistant     PIP/TAZO 32 INTERMEDIATE Intermediate     Extended ESBL NEGATIVE Sensitive     * 30,000 COLONIES/mL KLEBSIELLA PNEUMONIAE  MRSA PCR Screening     Status: None   Collection Time: 10/24/16  6:32 PM  Result Value Ref Range Status   MRSA by PCR NEGATIVE NEGATIVE Final    Comment:        The GeneXpert MRSA Assay (FDA approved for NASAL specimens only), is one component of a comprehensive MRSA colonization surveillance program. It is not intended to diagnose MRSA infection nor to guide or monitor treatment for MRSA  infections.   Urine culture     Status: Abnormal   Collection Time: 10/25/16  6:16 AM  Result Value Ref Range Status   Specimen Description URINE, RANDOM  Final   Special Requests NONE  Final   Culture >=100,000 COLONIES/mL KLEBSIELLA PNEUMONIAE (A)  Final   Report Status 10/27/2016 FINAL  Final   Organism ID, Bacteria KLEBSIELLA PNEUMONIAE (A)  Final      Susceptibility   Klebsiella pneumoniae - MIC*    AMPICILLIN >=32 RESISTANT Resistant     CEFAZOLIN <=4 SENSITIVE Sensitive     CEFTRIAXONE <=1 SENSITIVE Sensitive     CIPROFLOXACIN <=0.25 SENSITIVE Sensitive     GENTAMICIN <=1 SENSITIVE Sensitive     IMIPENEM <=0.25 SENSITIVE Sensitive     NITROFURANTOIN 64 INTERMEDIATE Intermediate     TRIMETH/SULFA <=20 SENSITIVE Sensitive     AMPICILLIN/SULBACTAM >=32 RESISTANT Resistant     PIP/TAZO 32 INTERMEDIATE Intermediate     Extended ESBL NEGATIVE Sensitive     * >=100,000 COLONIES/mL KLEBSIELLA PNEUMONIAE    Time coordinating discharge: Over 30 minutes  Hazeline Junker, MD  [facility] 10/27/2016, 12:51 PM Pager [phone]  If 7PM-7AM, please contact night-coverage [email or web address] Password [email or web address]

## 2016-10-27 NOTE — Progress Notes (Signed)
CSW assisting with d/c planning. Pt / family would like pt to return to ALF at d/c. [facility] once pt returns to baseline. ALF is unable to accept pt on IV antibiotics. CSW will continue to follow to assist with d/c planning.  Cori RazorJamie Lenzie Sandler LCSW [phone]

## 2016-10-27 NOTE — Progress Notes (Signed)
Inpatient Diabetes Program Recommendations  AACE/ADA: New Consensus Statement on Inpatient Glycemic Control (2015)  Target Ranges:  Prepandial:   less than 140 mg/dL      Peak postprandial:   less than 180 mg/dL (1-2 hours)      Critically ill patients:  140 - 180 mg/dL   Results for Jule SerWATER, Archibald L (MRN [id number]) as of 10/27/2016 09:25  Ref. Range 10/26/2016 07:34 10/26/2016 12:13 10/26/2016 17:51 10/26/2016 21:36  Glucose-Capillary Latest Ref Range: 65 - 99 mg/dL 409108 (H) 811101 (H) 914191 (H) 283 (H)   Results for Jule SerWATER, Sadik L (MRN [id number]) as of 10/27/2016 09:25  Ref. Range 10/27/2016 07:45  Glucose-Capillary Latest Ref Range: 65 - 99 mg/dL 086274 (H)    Admit with: AMS  History: DM, Dementia, CKD  Home DM Meds: Toujeo Insulin- 24 units daily       Humalog 7-9 units BID  Current Insulin Orders: Novolog Sensitive Correction Scale/ SSI (0-9 units) TID AC + HS      Glucose levels now on the rise last night and again this AM.  Ate 100% of dinner last PM.    MD- Please consider the following in-hospital insulin adjustments:  1. Start Lantus 12 units daily (50% home dose of long-acting insulin)  2. Continue Novolog Sensitive Correction Scale/ SSI (0-9 units) TID AC + HS      --Will follow patient during hospitalization--  Ambrose FinlandJeannine Johnston Juanita Devincent RN, MSN, CDE Diabetes Coordinator Inpatient Glycemic Control Team Team Pager: [phone] (8a-5p)

## 2016-10-27 NOTE — NC FL2 (Signed)
[place] MEDICAID FL2 LEVEL OF CARE SCREENING TOOL     IDENTIFICATION  Patient Name: Javier Murillo Birthdate: [birth year] Sex: male Admission Date (Current Location): 10/24/2016  [facility] [place]   Facility and Address:  [facility],  [address], [place] [id number]      Provider Number: [id number]  Attending Physician Name and Address:  Tyrone Nineyan B Grunz, MD  Relative Name and Phone Number:       Current Level of Care: Hospital Recommended Level of Care: Assisted Living Facility Prior Approval Number:    Date Approved/Denied:   PASRR Number:    Discharge Plan: Other (Comment) (assisted living facility)    Current Diagnoses: Patient Active Problem List   Diagnosis Date Noted  . UTI due to Klebsiella species   . Rhabdomyolysis 10/25/2016  . Elevated troponin 10/24/2016  . Acute encephalopathy 09/15/2015  . Acute respiratory failure with hypoxia (HCC) 09/15/2015  . Bradycardia 09/15/2015  . Diabetes mellitus with neuropathy (HCC) 09/15/2015  . Anemia 01/30/2015  . Quadriplegia and quadriparesis (HCC) 08/20/2014  . Gait disorder 08/20/2014  . Weakness 07/23/2014  . Diabetic neuropathy (HCC) 07/23/2014  . Acute renal failure (HCC) 07/23/2014  . HTN (hypertension) 07/23/2014  . Dyslipidemia 07/23/2014    Orientation RESPIRATION BLADDER Height & Weight     Self  Normal Incontinent Weight: 168 lb 10.4 oz (76.5 kg) Height:  5\' 7"  (170.2 cm)  BEHAVIORAL SYMPTOMS/MOOD NEUROLOGICAL BOWEL NUTRITION STATUS  Other (Comment) (no behaviors)   Incontinent Diet (CCHO)  AMBULATORY STATUS COMMUNICATION OF NEEDS Skin   Extensive Assist Verbally Normal                       Personal Care Assistance Level of Assistance  Bathing, Feeding, Dressing Bathing Assistance: Maximum assistance Feeding assistance: Limited assistance Dressing Assistance: Maximum assistance     Functional Limitations Info  Sight, Hearing, Speech Sight Info:  Adequate Hearing Info: Impaired Speech Info: Impaired    SPECIAL CARE FACTORS FREQUENCY  PT (By licensed PT), OT (By licensed OT)     PT Frequency: 3x wk OT Frequency: 3x wk            Contractures Contractures Info: Not present    Additional Factors Info  Code Status Code Status Info: FullCode             Current Medications (10/27/2016):  This is the current hospital active medication list Current Facility-Administered Medications  Medication Dose Route Frequency Provider Last Rate Last Dose  . acetaminophen (TYLENOL) tablet 650 mg  650 mg Oral Q6H PRN Ripudeep Jenna LuoK Rai, MD       Or  . acetaminophen (TYLENOL) suppository 650 mg  650 mg Rectal Q6H PRN Ripudeep Jenna LuoK Rai, MD   650 mg at 10/27/16 [id number]  . cefUROXime (CEFTIN) tablet 250 mg  250 mg Oral BID WC Tyrone Nineyan B Grunz, MD      . chlorhexidine (PERIDEX) 0.12 % solution 15 mL  15 mL Mouth Rinse BID Ripudeep K Rai, MD   15 mL at 10/27/16 1009  . dextrose 5 %-0.9 % sodium chloride infusion   Intravenous Continuous Tyrone Nineyan B Grunz, MD 75 mL/hr at 10/26/16 1813    . enoxaparin (LOVENOX) injection 40 mg  40 mg Subcutaneous Q24H Ripudeep K Rai, MD   40 mg at 10/26/16 2234  . insulin aspart (novoLOG) injection 0-5 Units  0-5 Units Subcutaneous QHS Tyrone Nineyan B Grunz, MD   2 Units at 10/26/16  2235  . insulin aspart (novoLOG) injection 0-9 Units  0-9 Units Subcutaneous TID WC Tyrone Nineyan B Grunz, MD   4 Units at 10/27/16 0827  . insulin glargine (LANTUS) injection 12 Units  12 Units Subcutaneous Daily Tyrone Nineyan B Grunz, MD      . MEDLINE mouth rinse  15 mL Mouth Rinse q12n4p Ripudeep K Rai, MD   15 mL at 10/26/16 1813  . ondansetron (ZOFRAN) tablet 4 mg  4 mg Oral Q6H PRN Ripudeep Jenna LuoK Rai, MD       Or  . ondansetron (ZOFRAN) injection 4 mg  4 mg Intravenous Q6H PRN Ripudeep K Rai, MD      . sodium chloride flush (NS) 0.9 % injection 3 mL  3 mL Intravenous Q12H Ripudeep K Rai, MD   3 mL at 10/27/16 1010  . traMADol (ULTRAM) tablet 50 mg  50 mg Oral Q6H PRN  Leanne ChangKatherine P Schorr, NP         Discharge Medications: Please see discharge summary for a list of discharge medications.   TAKE these medications   acetaminophen 500 MG tablet Commonly known as:  TYLENOL Take 1 tablet (500 mg total) by mouth every 6 (six) hours as needed (pain). What changed:  reasons to take this  amLODipine 5 MG tablet Commonly known as:  NORVASC Take 1 tablet (5 mg total) by mouth daily.  cefUROXime 250 MG tablet Commonly known as:  CEFTIN Take 1 tablet (250 mg total) by mouth 2 (two) times daily with a meal.  cholecalciferol 1000 units tablet Commonly known as:  VITAMIN D Take 1,000 Units by mouth every evening.  ferrous sulfate 325 (65 FE) MG tablet Take 325 mg by mouth daily with breakfast.  furosemide 20 MG tablet Commonly known as:  LASIX Take 1 tablet (20 mg total) by mouth daily.  HUMALOG 100 UNIT/ML injection Generic drug:  insulin lispro Inject 7-9 Units into the skin 2 (two) times daily. Sliding scale- if 0-200= 7 units, 201-249=8 units, 250+=9 units  lisinopril 10 MG tablet Commonly known as:  PRINIVIL,ZESTRIL Take 1 tablet (10 mg total) by mouth daily.  TOUJEO SOLOSTAR 300 UNIT/ML Sopn Generic drug:  Insulin Glargine Inject 24 Units into the skin daily.       Relevant Imaging Results:  Relevant Lab Results:   Additional Information SS# [id number]. Pt and OT services are needed at ALF.  [place] Paiz, Dickey GaveJamie Lee, LCSW

## 2016-10-28 LAB — FOLATE RBC
FOLATE, HEMOLYSATE: 348 ng/mL
Folate, RBC: 969 ng/mL (ref >498)
HEMATOCRIT: 35.9 % — AB (ref 37.5–51.0)

## 2016-11-19 ENCOUNTER — Ambulatory Visit (INDEPENDENT_AMBULATORY_CARE_PROVIDER_SITE_OTHER): Payer: Medicare Other | Admitting: Endocrinology

## 2016-11-19 ENCOUNTER — Encounter: Payer: Self-pay | Admitting: Endocrinology

## 2016-11-19 VITALS — BP 112/64 | HR 54 | Ht 71.0 in | Wt 169.0 lb

## 2016-11-19 DIAGNOSIS — Z794 Long term (current) use of insulin: Secondary | ICD-10-CM

## 2016-11-19 DIAGNOSIS — E1165 Type 2 diabetes mellitus with hyperglycemia: Secondary | ICD-10-CM

## 2016-11-19 NOTE — Patient Instructions (Signed)
From 11/20/16 change the TOUJEO insulin to 5 PM instead of 7 AM and increase the dose to 27 units once daily  HUMALOG for breakfast: Continue same doses  HUMALOG at lunch and dinner: Change the doses as follows: Blood sugar 80-99 = 10 units. 200-249 = 12 units Over 250 = 14 units  Please fax blood sugar readings in 3 weeks

## 2016-11-19 NOTE — Progress Notes (Signed)
Patient ID: Javier Murillo, male   DOB: 08-Sep-1930, 82 y.o.   MRN: [id number]           Reason for Appointment: Follow-up for Type 2 Diabetes  Referring physician: Zachery Dauer  History of Present Illness:          Diagnosis: Type 2 diabetes mellitus, date of diagnosis:1975        Past history: He may have been treated with oral agents for the first few years of his diabetes management He thinks he has been on insulin for several years, uncertain how long. For some reason he has been continued on glyburide also which she probably has been taking for a long time Not clear if he had taken metformin or why it was stopped  He has had fair control of his diabetes over the last 2-3 years with A1c ranging from 7.4-9.3, relatively higher since 10/2013  On his initial consultation he had been on a fixed dose of Lantus along with sliding scale Humalog based on blood sugar levels before meals Because of persistently high A1c of 9% or more since 06/2014 he seen in consultation and he was started on pre-meal insulin along with correction doses His glyburide stopped because of his age and needing basal bolus insulin   Recent history:  INSULIN regimen is described as:  Toujeo 24 units once a day    Mealtime coverage based on glucose readings: BREAKFAST doses are as follows: Blood sugar under 200 = 7 units 200-249 = 8 units.  250+ = 9 units  LUNCH and dinner doses: With blood sugar under 200 = 9 units 200-249 = 10 units and 250+ = 11 units  Currently taking basal bolus insulin regimen as monotherapy for diabetes On his last visit his Lantus was changed to Toujeo once a day in the morning  His A1c is was checked in the hospital in November and was better at 7.6, previously 8.7  Current blood sugar patterns and problems identified:  FASTING blood sugars are now mostly high especially in the last week or so and usually over 200 although have been overall fluctuating  His blood sugars are not  consistently high at lunchtime but still mostly high and not clear why he had a low reading of 63 on Sunday morning at 11 AM.  Not clear if his appetite is consistent although his son things that he is eating well  Blood sugars are also mostly high at suppertime and especially at 8 PM; the latter readings are consistently over 200 except once  Records were reviewed from this month only  Nursing home had requested that he have a sliding scale only rather than mealtime + correction for better compliance and inconvenience for the nurses there.  His son thinks that he is getting his insulin at mealtime more consistently now He is usually getting his insulin right before eating but occasionally after   Glucose monitoring:  done 3+ times a day.  Glucometer: Unknown        Blood Glucose readings by time of day from recent record of nursing home.    Mean values apply above for all meters except median for One Touch  PRE-MEAL Fasting Lunch Dinner Bedtime Overall  Glucose range: 143-319  63-324  127-298  196-265    Mean/median:  175  190  220      Diet: He is generally ordering whatever he wants on his menu at the nursing home and he usually finishes his meal, Usually avoiding  sweets       Self-care:     Meals: 3 meals per day. Breakfast is eggs, toast, sausage.  Lunch and dinner usually a meat or fish along with vegetables, bread and sugar-free ice cream.  Usually has sugar-free snacks           Physical activity:  none, in wheelchair         Dietician visit, most recent: Never .               Weight history:  Wt Readings from Last 3 Encounters:  11/19/16 169 lb (76.7 kg)  10/26/16 168 lb 10.4 oz (76.5 kg)  09/15/16 166 lb 3.2 oz (75.4 kg)    Glycemic control:   Lab Results  Component Value Date   HGBA1C 7.6 (H) 10/24/2016   HGBA1C 8.7 09/15/2016   HGBA1C 8.3 06/04/2016   Lab Results  Component Value Date   MICROALBUR 1.6 06/04/2016   LDLCALC 80 12/05/2015   CREATININE 1.31  (H) 10/27/2016     Allergies as of 11/19/2016      Reactions   Fluvirin [influenza Virus Vaccine Split] Other (See Comments)   Gave patient flu      Medication List       Accurate as of 11/19/16  9:18 PM. Always use your most recent med list.          acetaminophen 500 MG tablet Commonly known as:  TYLENOL Take 1 tablet (500 mg total) by mouth every 6 (six) hours as needed (pain).   amLODipine 5 MG tablet Commonly known as:  NORVASC Take 1 tablet (5 mg total) by mouth daily.   cefUROXime 250 MG tablet Commonly known as:  CEFTIN Take 1 tablet (250 mg total) by mouth 2 (two) times daily with a meal.   cholecalciferol 1000 units tablet Commonly known as:  VITAMIN D Take 1,000 Units by mouth every evening.   ferrous sulfate 325 (65 FE) MG tablet Take 325 mg by mouth daily with breakfast.   furosemide 20 MG tablet Commonly known as:  LASIX Take 1 tablet (20 mg total) by mouth daily.   HUMALOG 100 UNIT/ML injection Generic drug:  insulin lispro Inject 7-9 Units into the skin 2 (two) times daily. Sliding scale- if 0-200= 7 units, 201-249=8 units, 250+=9 units   lisinopril 10 MG tablet Commonly known as:  PRINIVIL,ZESTRIL Take 1 tablet (10 mg total) by mouth daily.   TOUJEO SOLOSTAR 300 UNIT/ML Sopn Generic drug:  Insulin Glargine Inject 24 Units into the skin daily.       Allergies:  Allergies  Allergen Reactions  . Fluvirin [Influenza Virus Vaccine Split] Other (See Comments)    Gave patient flu    Past Medical History:  Diagnosis Date  . Arthritis   . Chronic low back pain   . Degenerative arthritis   . Depression   . Diabetes mellitus without complication (HCC)   . Diabetic peripheral neuropathy (HCC)   . Gait disorder   . Hyperlipidemia   . Hypertension   . Kidney stones    only once  . Lumbosacral spondylosis   . Peripheral vascular disease (HCC)   . Quadriplegia and quadriparesis (HCC) 08/20/2014    Past Surgical History:  Procedure  Laterality Date  . ANTERIOR CERVICAL DECOMP/DISCECTOMY FUSION N/A 08/22/2014   Procedure: ANTERIOR CERVICAL DECOMPRESSION/DISCECTOMY FUSION, cervical three-four;  Surgeon: Hewitt Shortsobert W Nudelman, MD;  Location: [facility];  Service: Neurosurgery;  Laterality: N/A;  C3-4 anterior cervical decompression with fusion plating  and bonegraft  . APPENDECTOMY    . BACK SURGERY     x 7  . bone spur removed  right sholder    . bursitis Bilateral    olecranon I&D  . CATARACT EXTRACTION W/ INTRAOCULAR LENS  IMPLANT, BILATERAL    . EYE SURGERY     bilateral cataracts  . LUMBAR SPINE SURGERY     x7  . SHOULDER SURGERY Bilateral     Family History  Problem Relation Age of Onset  . Cancer Mother   . Heart attack Father   . Dementia Sister     Social History:  reports that he has quit smoking. His smoking use included Cigarettes. He has a 10.00 pack-year smoking history. He has never used smokeless tobacco. He reports that he does not drink alcohol or use drugs.    Review of Systems   Admitted in November  because of excessive somnolence and sepsis      Lipids: not on any medication for hyperlipidemia       Lab Results  Component Value Date   CHOL 164 12/05/2015   HDL 54.90 12/05/2015   LDLCALC 80 12/05/2015   TRIG 146.0 12/05/2015   CHOLHDL 3 12/05/2015               He does have weakness in his legs from central cord problems previously and not able to ambulate Also some numbness in fingers  History of leg edema especially on the left, Recently better Lasix stopped by PCP.   Physical Examination:  BP 112/64   Pulse (!) 54   Ht 5\' 11"  (1.803 m)   Wt 169 lb (76.7 kg)   BMI 23.57 kg/m    ASSESSMENT:  Diabetes type 2, uncontrolled  See history of present illness for detailed discussion of his current management, blood sugar patterns and problems identified  He is on a basal bolus insulin regimen with Toujeo instead of Lantus  However his blood sugars appear to be overall  higher even though last month his A1c was relatively better at 7.6 Fasting blood sugars are relatively higher Not clear if his diet has been as good recently because of the holidays Blood sugars are fluctuating as before and more so at lunchtime and suppertime  Blood sugars are fairly consistently over 200 after supper and mostly higher before supper also Unable to get him to comply with diet consistently because of his nursing home environment    PLAN:  Change Toujeo insulin to the evening and increase the dose by 2 units Will increase his mealtime coverage by at least 1 unit for lunch and supper for now Details instructions given Blood sugars will be reviewed by fax again in 3 weeks  A1c in 2 months     Patient Instructions  From 11/20/16 change the TOUJEO insulin to 5 PM instead of 7 AM and increase the dose to 27 units once daily  HUMALOG for breakfast: Continue same doses  HUMALOG at lunch and dinner: Change the doses as follows: Blood sugar 80-99 = 10 units. 200-249 = 12 units Over 250 = 14 units  Please fax blood sugar readings in 3 weeks         Maryrose Colvin 11/19/2016, 9:18 PM   Note: This office note was prepared with Insurance underwriterDragon voice recognition system technology. Any transcriptional errors that result from this process are unintentional. Sleep

## 2017-01-19 ENCOUNTER — Ambulatory Visit (INDEPENDENT_AMBULATORY_CARE_PROVIDER_SITE_OTHER): Payer: Medicare Other | Admitting: Endocrinology

## 2017-01-19 ENCOUNTER — Encounter: Payer: Self-pay | Admitting: Endocrinology

## 2017-01-19 VITALS — BP 120/60 | HR 68 | Ht 71.0 in | Wt 169.0 lb

## 2017-01-19 DIAGNOSIS — E1165 Type 2 diabetes mellitus with hyperglycemia: Secondary | ICD-10-CM

## 2017-01-19 DIAGNOSIS — Z794 Long term (current) use of insulin: Secondary | ICD-10-CM | POA: Diagnosis not present

## 2017-01-19 NOTE — Patient Instructions (Signed)
Add 4 U Toujeo in am

## 2017-01-19 NOTE — Progress Notes (Signed)
Patient ID: Javier Murillo, male   DOB: 1930/09/14, 83 y.o.   MRN: [id number]           Reason for Appointment: Follow-up for Type 2 Diabetes  Referring physician: Zachery Dauer  History of Present Illness:          Diagnosis: Type 2 diabetes mellitus, date of diagnosis:1975        Past history: He may have been treated with oral agents for the first few years of his diabetes management He thinks he has been on insulin for several years, uncertain how long. For some reason he has been continued on glyburide also which she probably has been taking for a long time Not clear if he had taken metformin or why it was stopped  He has had fair control of his diabetes over the last 2-3 years with A1c ranging from 7.4-9.3, relatively higher since 10/2013  On his initial consultation he had been on a fixed dose of Lantus along with sliding scale Humalog based on blood sugar levels before meals Because of persistently high A1c of 9% or more since 06/2014 he seen in consultation and he was started on pre-meal insulin along with correction doses His glyburide stopped because of his age and needing basal bolus insulin   Recent history:  INSULIN regimen is described as:  Toujeo 27 units once a day in the evening   Mealtime coverage based on glucose readings: BREAKFAST doses are as follows: Blood sugar under 200 = 7 units 200-249 = 8 units.  250+ = 9 units  LUNCH and dinner doses: With blood sugar under 200 = 10 units 200-249 = 12 units and 250+ = 14 units  Currently taking basal bolus insulin regimen as monotherapy for diabetes  His A1c is relatively better at 7.2  was checked in the hospital in November and was better at 7.6, previously 8.7  Current blood sugar patterns and problems identified:  FASTING blood sugars are now improved usually with switching the Toujeo to the evening instead of the mornings dose, occasionally still has readings over 200, one 87 today  Blood sugars are relatively  better at lunchtime without hypoglycemia  HIGHEST blood sugars appear to be at suppertime although has a couple of relatively good readings also almost readings are around 200 more  He has variable readings after supper based on his intake and a couple of readings are over 304 100  His son does not think he is eating differently  More recently has been a little more confused  Records were reviewed from this month only for 12 days organized by time of day  Nursing home is preferring a sliding scale only rather than mealtime + correction for better compliance and inconvenience for the nurses there.  His son thinks that he is getting his insulin at mealtime more consistently now He is usually getting his insulin right before eating but occasionally after, insulin doses were reviewed on his MAR    Glucose monitoring:  done 3+ times a day.  Glucometer: Unknown        Blood Glucose readings by time of day from recent record of nursing home.    Mean values apply above for all meters except median for One Touch  PRE-MEAL Fasting Lunch Dinner Bedtime Overall  Glucose range: 101-314  77-240  85-320  133-408    Mean/median:         Diet: He Has a fairly good appetite, his son does not think he is always  getting a diabetic diet       Self-care:     Meals: 3 meals per day. Breakfast is eggs, toast, sausage.  Lunch and dinner usually a meat or fish along with vegetables, bread and sugar-free ice cream.  Usually has sugar-free snacks           Physical activity:  none, in wheelchair         Dietician visit, most recent: Never .               Weight history:  Wt Readings from Last 3 Encounters:  01/19/17 169 lb (76.7 kg)  11/19/16 169 lb (76.7 kg)  10/26/16 168 lb 10.4 oz (76.5 kg)    Glycemic control:   Lab Results  Component Value Date   HGBA1C 7.2 01/20/2017   HGBA1C 7.6 (H) 10/24/2016   HGBA1C 8.7 09/15/2016   Lab Results  Component Value Date   MICROALBUR 1.6 06/04/2016    LDLCALC 80 12/05/2015   CREATININE 1.31 (H) 10/27/2016     Allergies as of 01/19/2017      Reactions   Fluvirin [influenza Virus Vaccine Split] Other (See Comments)   Gave patient flu      Medication List       Accurate as of 01/19/17 11:59 PM. Always use your most recent med list.          acetaminophen 500 MG tablet Commonly known as:  TYLENOL Take 1 tablet (500 mg total) by mouth every 6 (six) hours as needed (pain).   amLODipine 5 MG tablet Commonly known as:  NORVASC Take 1 tablet (5 mg total) by mouth daily.   cefUROXime 250 MG tablet Commonly known as:  CEFTIN Take 1 tablet (250 mg total) by mouth 2 (two) times daily with a meal.   cholecalciferol 1000 units tablet Commonly known as:  VITAMIN D Take 1,000 Units by mouth every evening.   ferrous sulfate 325 (65 FE) MG tablet Take 325 mg by mouth daily with breakfast.   furosemide 20 MG tablet Commonly known as:  LASIX Take 1 tablet (20 mg total) by mouth daily.   HUMALOG 100 UNIT/ML injection Generic drug:  insulin lispro Inject 7-9 Units into the skin 2 (two) times daily. Sliding scale- if 0-200= 7 units, 201-249=8 units, 250+=9 units   lisinopril 10 MG tablet Commonly known as:  PRINIVIL,ZESTRIL Take 1 tablet (10 mg total) by mouth daily.   TOUJEO SOLOSTAR 300 UNIT/ML Sopn Generic drug:  Insulin Glargine Inject 27 Units into the skin daily.       Allergies:  Allergies  Allergen Reactions  . Fluvirin [Influenza Virus Vaccine Split] Other (See Comments)    Gave patient flu    Past Medical History:  Diagnosis Date  . Arthritis   . Chronic low back pain   . Degenerative arthritis   . Depression   . Diabetes mellitus without complication (HCC)   . Diabetic peripheral neuropathy (HCC)   . Gait disorder   . Hyperlipidemia   . Hypertension   . Kidney stones    only once  . Lumbosacral spondylosis   . Peripheral vascular disease (HCC)   . Quadriplegia and quadriparesis (HCC) 08/20/2014     Past Surgical History:  Procedure Laterality Date  . ANTERIOR CERVICAL DECOMP/DISCECTOMY FUSION N/A 08/22/2014   Procedure: ANTERIOR CERVICAL DECOMPRESSION/DISCECTOMY FUSION, cervical three-four;  Surgeon: Hewitt Shorts, MD;  Location: [facility];  Service: Neurosurgery;  Laterality: N/A;  C3-4 anterior cervical decompression with fusion plating  and bonegraft  . APPENDECTOMY    . BACK SURGERY     x 7  . bone spur removed  right sholder    . bursitis Bilateral    olecranon I&D  . CATARACT EXTRACTION W/ INTRAOCULAR LENS  IMPLANT, BILATERAL    . EYE SURGERY     bilateral cataracts  . LUMBAR SPINE SURGERY     x7  . SHOULDER SURGERY Bilateral     Family History  Problem Relation Age of Onset  . Cancer Mother   . Heart attack Father   . Dementia Sister     Social History:  reports that he has quit smoking. His smoking use included Cigarettes. He has a 10.00 pack-year smoking history. He has never used smokeless tobacco. He reports that he does not drink alcohol or use drugs.    Review of Systems   Admitted in November  because of excessive somnolence and sepsis      Lipids: not on any medication for hyperlipidemia       Lab Results  Component Value Date   CHOL 164 12/05/2015   HDL 54.90 12/05/2015   LDLCALC 80 12/05/2015   TRIG 146.0 12/05/2015   CHOLHDL 3 12/05/2015               He does have weakness in his legs from central cord problems previously and not able to ambulate Also some numbness in fingers  History of leg edema especially on the left, Recently better Lasix stopped by PCP.   Physical Examination:  BP 120/60   Pulse 68   Ht 5\' 11"  (1.803 m)   Wt 169 lb (76.7 kg)   SpO2 95%   BMI 23.57 kg/m    ASSESSMENT:  Diabetes type 2, uncontrolled  See history of present illness for detailed discussion of his current management, blood sugar patterns and problems identified  He is on a basal bolus insulin regimen with Toujeo once a day and  mealtime NovoLog for blood sugars over 80 Morning sugars are better with switching to generic of the evening Surprisingly however his A1c is better at 7.2  Blood sugars are fluctuating as before    Blood sugars are fairly consistently over 200 Before supper and periodically higher before supper also Some of this is variable diet that cannot be controlled  PLAN:  Additional 4 units of Toujeo in the morning since suppertime readings are mostly high No change in mealtime insulin doses, these apparently are being given as directed Follow-up in 3 months again To have labs done with PCP tomorrow      Patient Instructions  Add 4 U Toujeo in am        [facility] MartinezKUMAR,Audyn Dimercurio 01/20/2017, 8:14 AM   Note: This office note was prepared with Insurance underwriterDragon voice recognition system technology. Any transcriptional errors that result from this process are unintentional. Sleep

## 2017-01-20 LAB — POCT GLYCOSYLATED HEMOGLOBIN (HGB A1C): Hemoglobin A1C: 7.2

## 2017-03-16 IMAGING — CR DG CHEST 2V
2 series · 2 of 2 positions shown · non-contrast
Comparison: None.

CLINICAL DATA: Patient with weakness, lethargy. Confusion.
Dementia.

EXAM:
CHEST  2 VIEW

[w chest lat]
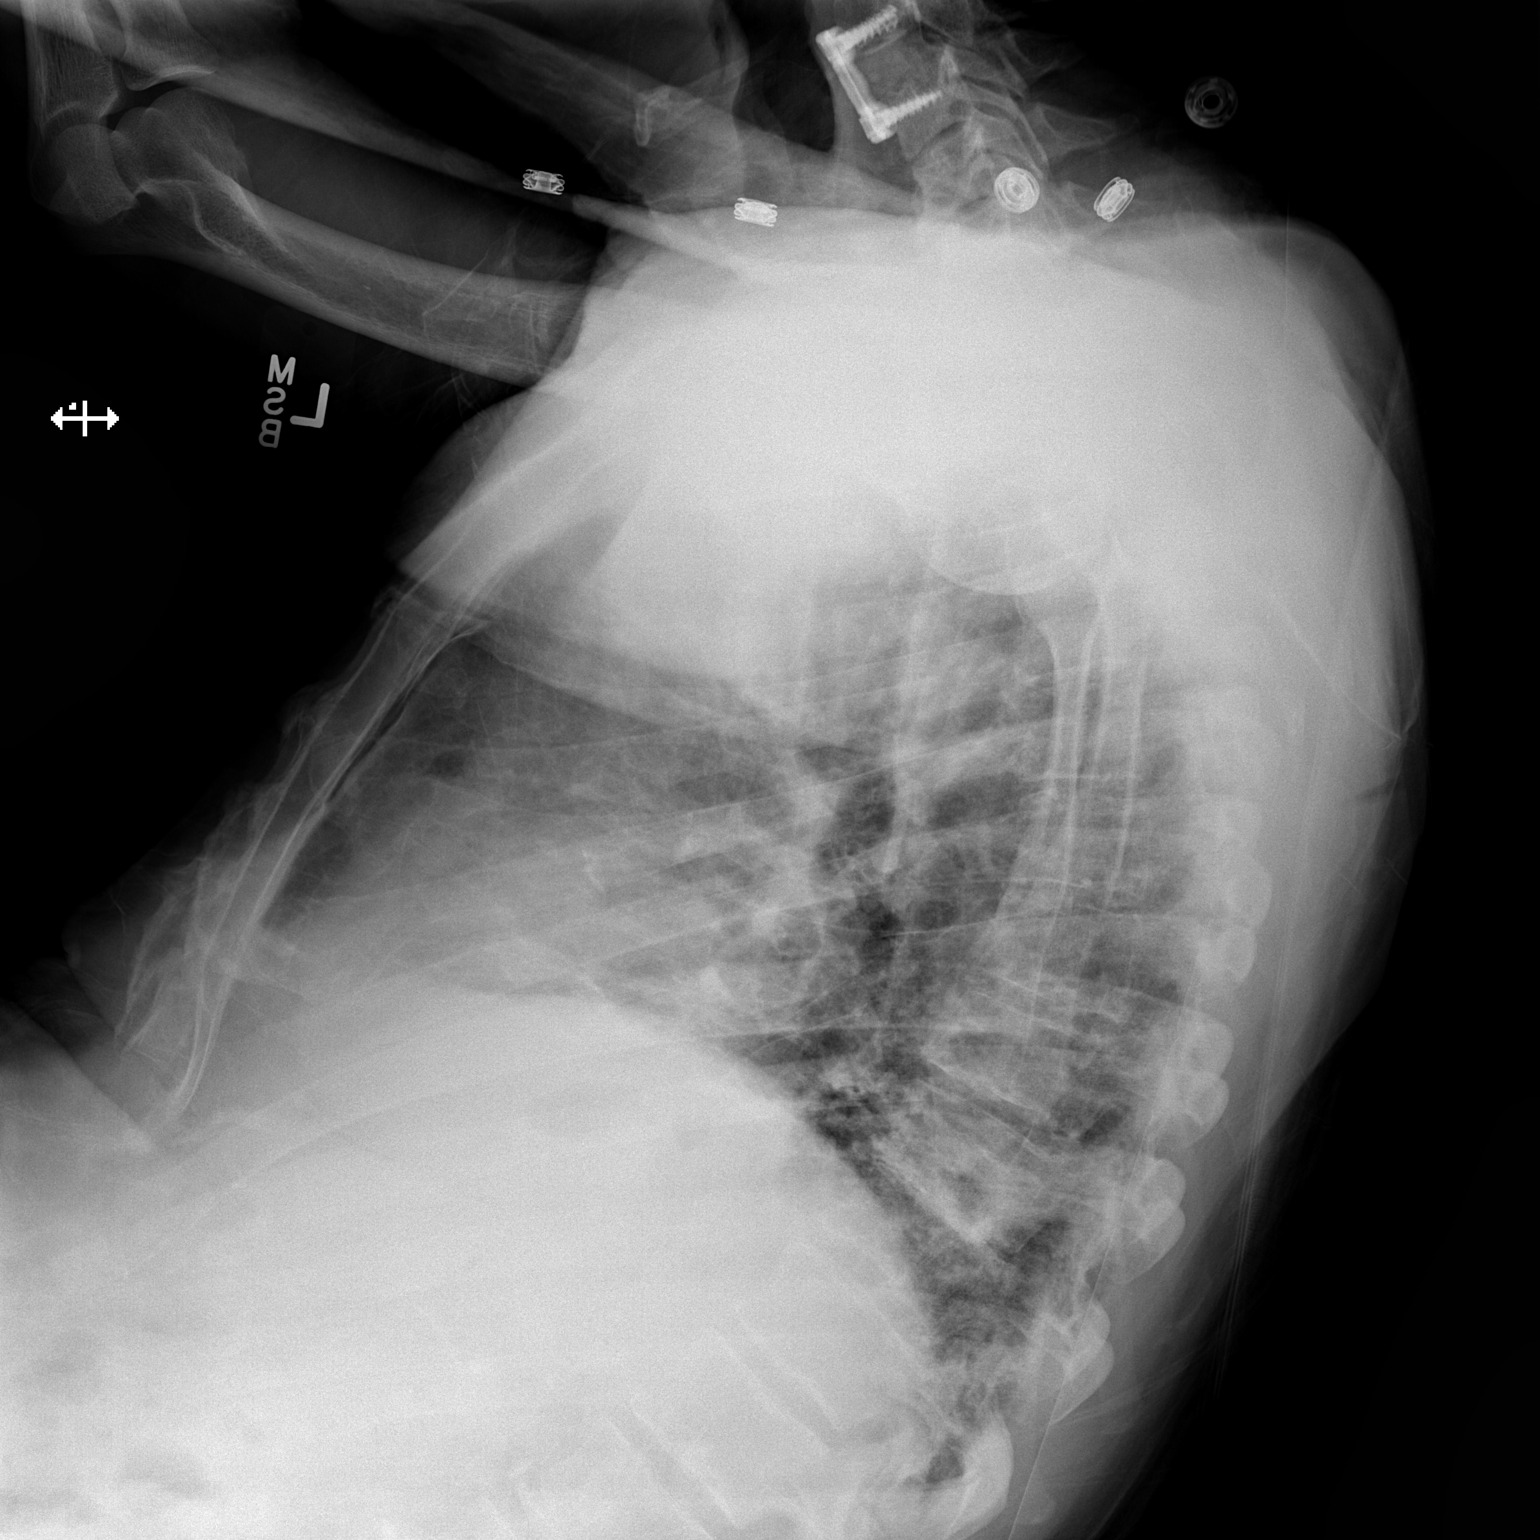

[x chest ap]
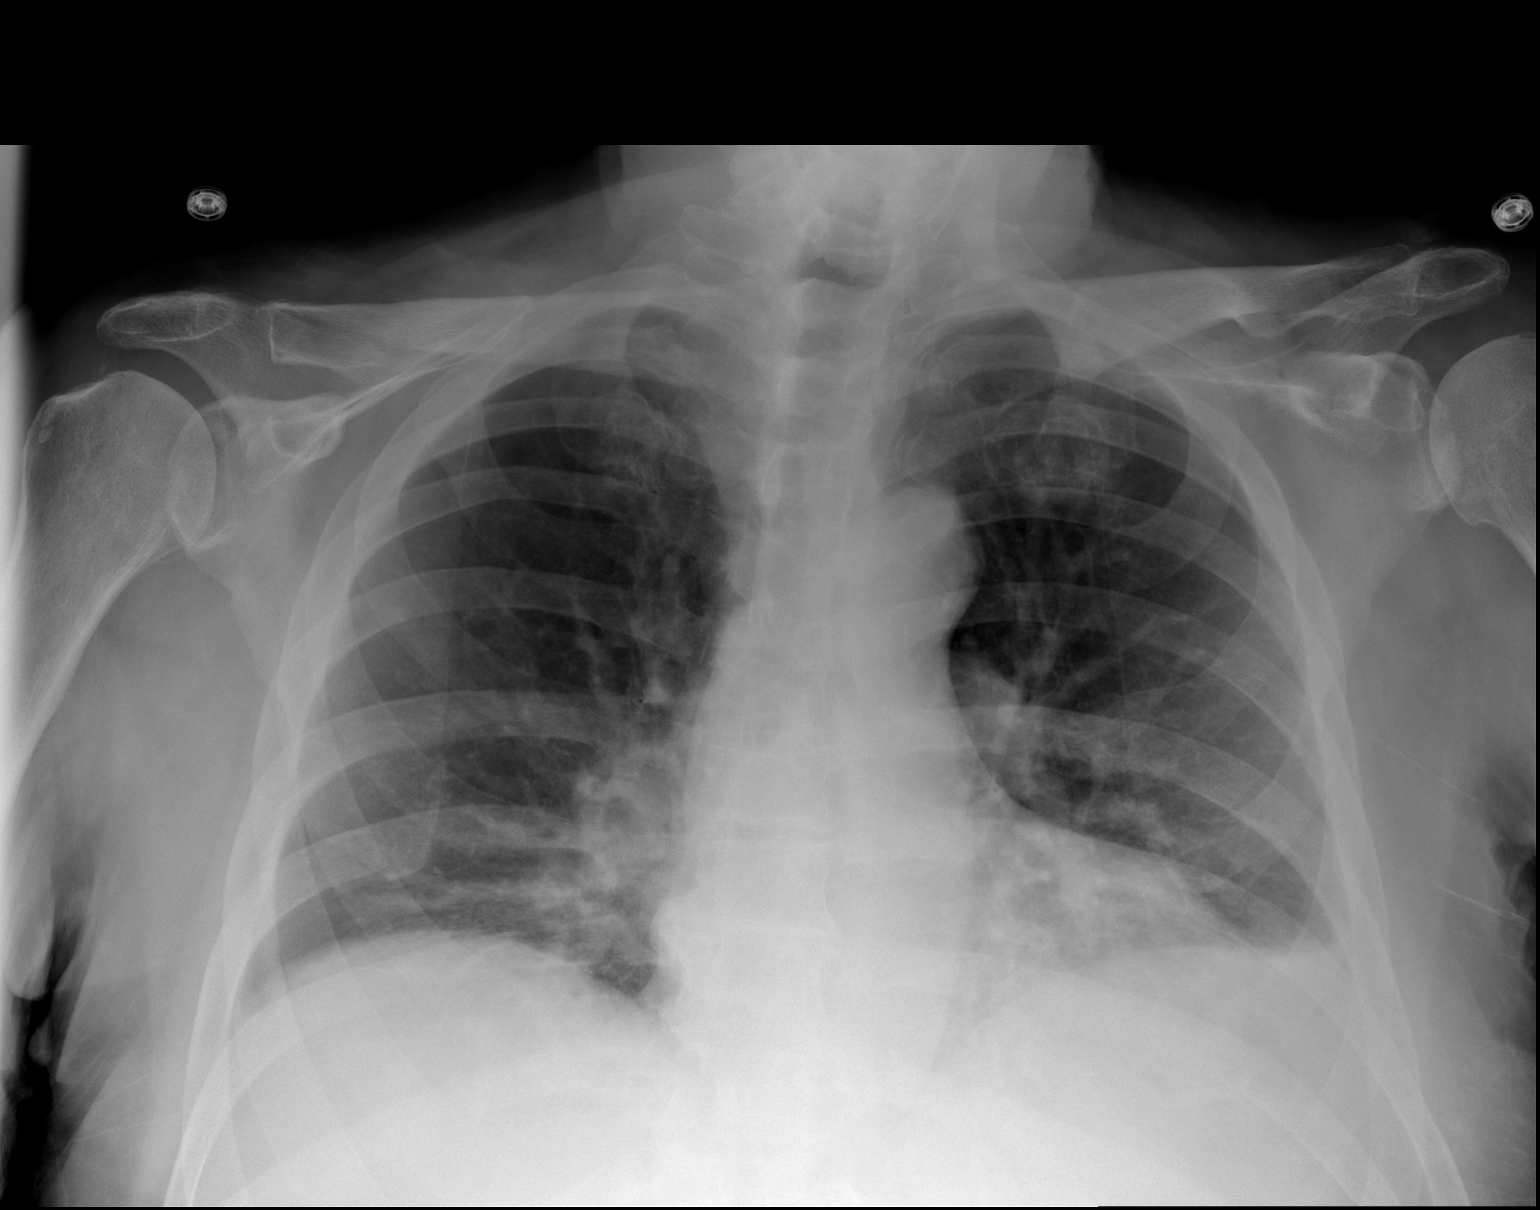

[2 of 2 positions shown; findings below may reference images not displayed]

FINDINGS: Low lung volumes. Normal cardiac mediastinal contours. Bilateral
lower lung heterogeneous opacities. Anterior cervical spinal fusion
hardware. Mid thoracic spine degenerative changes.
IMPRESSION: Low lung volumes limit examination. Basilar heterogeneous opacities
may represent atelectasis or infection. Component of interstitial
process, particularly given the appearance on lateral view is not
excluded.

## 2017-03-18 ENCOUNTER — Telehealth: Payer: Self-pay | Admitting: Endocrinology

## 2017-03-18 NOTE — Telephone Encounter (Signed)
Joe called from [facility] to see if received a from from them form concerning patient depression. He is re faxing it.

## 2017-03-18 NOTE — Telephone Encounter (Signed)
This is not my responsibility.  Please tell them to send this information to his primary care doctor, I have no knowledge about his depression illness

## 2017-03-19 NOTE — Telephone Encounter (Signed)
I have spoken with the daughter and this has been resolved she will call his PCP if needed

## 2017-03-25 ENCOUNTER — Telehealth: Payer: Self-pay | Admitting: Endocrinology

## 2017-03-25 NOTE — Telephone Encounter (Signed)
Tried to contact [facility] but was not successful- Dr. Lucianne Muss has addressed this issue in a previous note and is asking for [facility] to contact his PCP as Dr. Lucianne Muss does not handle his depression dx- I will try to contact [facility] again tomorrow

## 2017-03-25 NOTE — Telephone Encounter (Signed)
[employer] needed to know if we received a provider query about the dx for depression and needed clarification. Please have provider fill out form if it was received and fax to [phone].   Call Marylene Land from [employer] if there are any questions or if we did not receive the form.

## 2017-04-29 ENCOUNTER — Encounter: Payer: Self-pay | Admitting: Endocrinology

## 2017-04-29 ENCOUNTER — Ambulatory Visit (INDEPENDENT_AMBULATORY_CARE_PROVIDER_SITE_OTHER): Payer: Medicare Other | Admitting: Endocrinology

## 2017-04-29 VITALS — BP 126/67 | HR 82 | Ht 71.0 in | Wt 175.4 lb

## 2017-04-29 DIAGNOSIS — D638 Anemia in other chronic diseases classified elsewhere: Secondary | ICD-10-CM

## 2017-04-29 DIAGNOSIS — Z794 Long term (current) use of insulin: Secondary | ICD-10-CM | POA: Diagnosis not present

## 2017-04-29 DIAGNOSIS — E1165 Type 2 diabetes mellitus with hyperglycemia: Secondary | ICD-10-CM

## 2017-04-29 LAB — COMPREHENSIVE METABOLIC PANEL
ALBUMIN: 4.4 g/dL (ref 3.5–5.2)
ALK PHOS: 57 U/L (ref 39–117)
ALT: 12 U/L (ref 0–53)
AST: 20 U/L (ref 0–37)
BUN: 36 mg/dL — ABNORMAL HIGH (ref 6–23)
CHLORIDE: 104 meq/L (ref 96–112)
CO2: 28 mEq/L (ref 19–32)
Calcium: 9.6 mg/dL (ref 8.4–10.5)
Creatinine, Ser: 1.44 mg/dL (ref 0.40–1.50)
GFR: 49.57 mL/min — AB (ref >60.00)
Glucose, Bld: 106 mg/dL — ABNORMAL HIGH (ref 70–99)
POTASSIUM: 4.6 meq/L (ref 3.5–5.1)
Sodium: 137 mEq/L (ref 135–145)
TOTAL PROTEIN: 6.8 g/dL (ref 6.0–8.3)
Total Bilirubin: 0.4 mg/dL (ref 0.2–1.2)

## 2017-04-29 LAB — LIPID PANEL
CHOLESTEROL: 167 mg/dL (ref 0–200)
HDL: 57.9 mg/dL (ref >39.00)
LDL CALC: 92 mg/dL (ref 0–99)
NonHDL: 109.23
Total CHOL/HDL Ratio: 3
Triglycerides: 87 mg/dL (ref 0.0–149.0)
VLDL: 17.4 mg/dL (ref 0.0–40.0)

## 2017-04-29 LAB — CBC
HEMATOCRIT: 40.7 % (ref 39.0–52.0)
HEMOGLOBIN: 13.4 g/dL (ref 13.0–17.0)
MCHC: 32.9 g/dL (ref 30.0–36.0)
MCV: 98.7 fl (ref 78.0–100.0)
Platelets: 303 10*3/uL (ref 150.0–400.0)
RBC: 4.12 Mil/uL — ABNORMAL LOW (ref 4.22–5.81)
RDW: 12.8 % (ref 11.5–15.5)
WBC: 10.2 10*3/uL (ref 4.0–10.5)

## 2017-04-29 NOTE — Patient Instructions (Signed)
BREAKFAST doses are as now follows: Blood sugar under 200 = 8 units 200-249 = 9 units.  250+ = 11 units  LUNCH and dinner doses: SAME:   With blood sugar under 200 = 10 units 200-249 = 12 units and 250+ = 14 units

## 2017-04-29 NOTE — Progress Notes (Addendum)
Patient ID: Javier Murillo, male   DOB: [birth year], 83 y.o.   MRN: [id number]           Reason for Appointment: Follow-up for Type 2 Diabetes  Referring physician: Zachery Dauer  History of Present Illness:          Diagnosis: Type 2 diabetes mellitus, date of diagnosis:1975        Past history: He may have been treated with oral agents for the first few years of his diabetes management He thinks he has been on insulin for several years, uncertain how long. For some reason he has been continued on glyburide also which she probably has been taking for a long time Not clear if he had taken metformin or why it was stopped  He has had fair control of his diabetes over the last 2-3 years with A1c ranging from 7.4-9.3, relatively higher since 10/2013  On his initial consultation he had been on a fixed dose of Lantus along with sliding scale Humalog based on blood sugar levels before meals Because of persistently high A1c of 9% or more since 06/2014 he seen in consultation and he was started on pre-meal insulin along with correction doses His glyburide stopped because of his age and needing basal bolus insulin   Recent history:  INSULIN regimen is described as:  4 units in the morning and Toujeo 27 units in the evening   Mealtime coverage based on glucose readings: BREAKFAST doses are as follows: Blood sugar under 200 = 7 units, 200-249 = 8 units.  250+ = 9 units  LUNCH and dinner doses: With blood sugar under 200 = 10 units 200-249 = 12 units and 250+ = 14 units  He has been on a regimen of basal bolus insulin regimen as monotherapy for diabetes  His A1c is still reasonably good at around 7% as before  Current blood sugar patterns and problems identified:  Blood sugars are reviewed from his nursing home record  FASTING blood sugars are somewhat variable again but not consistently high and only occasionally below 100  His highest blood sugars appear to be before lunch and frequently over  200  His blood sugars have been variable at suppertime being significantly higher in the first 10 days or so the month and subsequently more frequently below 200   He has only a few readings after supper even though his nursing home has instructions to check blood sugars 4 times a day, these are relatively better and once below 100, overall on an average these aren't the best readings of the day  However he is generally getting a sandwich at bedtime according to his son  No hypoglycemia at any time  Mealtime insulin has been adjusted based on his blood sugar level with a minimum amount at breakfast and Humalog is held only if blood sugar is below 80 at lunch and supper  Overall daytime readings especially before supper are somewhat better with adding Toujeo in the morning, 4 units on the last visit He is usually getting his insulin right before eating but occasionally after, insulin doses were reviewed on his MAR   Glucose monitoring:  done 3+ times a day.  Glucometer: Unknown        Blood Glucose readings by time of day from recent record of nursing home as above.     Diet: He Has a fairly good appetite, he apparently eats whatever he is given at the nursing home including now a full peanut butter sandwich  at bedtime       Self-care:     Meals: 3 meals per day. Breakfast is eggs, toast, sausage.  Lunch and dinner usually a meat or fish along with vegetables, bread and sugar-free ice cream.  Usually has sugar-free snacks           Physical activity:  none, in wheelchair         Dietician visit, most recent: Never .               Weight history:  Wt Readings from Last 3 Encounters:  04/29/17 175 lb 6.4 oz (79.6 kg)  01/19/17 169 lb (76.7 kg)  11/19/16 169 lb (76.7 kg)    Glycemic control:   Lab Results  Component Value Date   HGBA1C 7.3 04/29/2017   HGBA1C 7.2 01/20/2017   HGBA1C 7.6 (H) 10/24/2016   Lab Results  Component Value Date   MICROALBUR 1.6 06/04/2016   LDLCALC  92 04/29/2017   CREATININE 1.44 04/29/2017     Allergies as of 04/29/2017      Reactions   Fluvirin [influenza Virus Vaccine Split] Other (See Comments)   Gave patient flu      Medication List       Accurate as of 04/29/17 11:59 PM. Always use your most recent med list.          acetaminophen 500 MG tablet Commonly known as:  TYLENOL Take 1 tablet (500 mg total) by mouth every 6 (six) hours as needed (pain).   amLODipine 5 MG tablet Commonly known as:  NORVASC Take 1 tablet (5 mg total) by mouth daily.   cefUROXime 250 MG tablet Commonly known as:  CEFTIN Take 1 tablet (250 mg total) by mouth 2 (two) times daily with a meal.   cholecalciferol 1000 units tablet Commonly known as:  VITAMIN D Take 1,000 Units by mouth every evening.   ferrous sulfate 325 (65 FE) MG tablet Take 325 mg by mouth daily with breakfast.   furosemide 20 MG tablet Commonly known as:  LASIX Take 1 tablet (20 mg total) by mouth daily.   HUMALOG 100 UNIT/ML injection Generic drug:  insulin lispro Inject 7-9 Units into the skin 2 (two) times daily. Sliding scale- if 0-200= 7 units, 201-249=8 units, 250+=9 units   lisinopril 10 MG tablet Commonly known as:  PRINIVIL,ZESTRIL Take 1 tablet (10 mg total) by mouth daily.   TOUJEO SOLOSTAR 300 UNIT/ML Sopn Generic drug:  Insulin Glargine Inject 27 Units into the skin daily.       Allergies:  Allergies  Allergen Reactions  . Fluvirin [Influenza Virus Vaccine Split] Other (See Comments)    Gave patient flu    Past Medical History:  Diagnosis Date  . Arthritis   . Chronic low back pain   . Degenerative arthritis   . Depression   . Diabetes mellitus without complication (HCC)   . Diabetic peripheral neuropathy (HCC)   . Gait disorder   . Hyperlipidemia   . Hypertension   . Kidney stones    only once  . Lumbosacral spondylosis   . Peripheral vascular disease (HCC)   . Quadriplegia and quadriparesis (HCC) 08/20/2014    Past  Surgical History:  Procedure Laterality Date  . ANTERIOR CERVICAL DECOMP/DISCECTOMY FUSION N/A 08/22/2014   Procedure: ANTERIOR CERVICAL DECOMPRESSION/DISCECTOMY FUSION, cervical three-four;  Surgeon: Hewitt Shortsobert W Nudelman, MD;  Location: [facility];  Service: Neurosurgery;  Laterality: N/A;  C3-4 anterior cervical decompression with fusion plating and bonegraft  .  APPENDECTOMY    . BACK SURGERY     x 7  . bone spur removed  right sholder    . bursitis Bilateral    olecranon I&D  . CATARACT EXTRACTION W/ INTRAOCULAR LENS  IMPLANT, BILATERAL    . EYE SURGERY     bilateral cataracts  . LUMBAR SPINE SURGERY     x7  . SHOULDER SURGERY Bilateral     Family History  Problem Relation Age of Onset  . Cancer Mother   . Heart attack Father   . Dementia Sister     Social History:  reports that he has quit smoking. His smoking use included Cigarettes. He has a 10.00 pack-year smoking history. He has never used smokeless tobacco. He reports that he does not drink alcohol or use drugs.    Review of Systems   His son is stating that his father has been slightly more lethargic the last day or so without fever, some running nose present      Lipids: not on any medication for hyperlipidemia, LDL is well controlled       Lab Results  Component Value Date   CHOL 167 04/29/2017   HDL 57.90 04/29/2017   LDLCALC 92 04/29/2017   TRIG 87.0 04/29/2017   CHOLHDL 3 04/29/2017               He Has chronic weakness in his legs from central cord problems previously and not able to ambulate Also some numbness in fingers  History of leg edema especially on the left followed by PCP   Physical Examination:  BP 126/67   Pulse 82   Ht 5\' 11"  (1.803 m)   Wt 175 lb 6.4 oz (79.6 kg)   SpO2 96%   BMI 24.46 kg/m   He is not very conversant today 1+ left lower leg edema and right side shows only trace edema  ASSESSMENT:  Diabetes type 2, uncontrolled  See history of present illness for detailed  discussion of his current management, blood sugar patterns and problems identified  He is on a basal bolus insulin regimen with Toujeo, Now getting this twice a day since an evening dose was not apparently covering his sugars to the next evening  A1c is still fairly good around 7% Hypoglycemia not present He is still having some variability in his blood sugars at all times some: Lowest readings after supper and morning readings are fairly close to target His diet is still variable and now getting a large snack at night  PLAN:  Will increase his morning Humalog insulin by 1 unit Increase morning Toujeo to 6 units Written instructions given for cutting back his bedtime snack to half sandwich He will need to have more blood sugar done after supper on a regular basis Labs drawn today Follow-up in 3 months Will also follow-up review of his blood sugars by fax at the end of June, instructions given for nursing home  Patient Instructions  BREAKFAST doses are as now follows: Blood sugar under 200 = 8 units 200-249 = 9 units.  250+ = 11 units  LUNCH and dinner doses: SAME:   With blood sugar under 200 = 10 units 200-249 = 12 units and 250+ = 14 units        Jaramiah Bossard 05/04/2017, 9:38 PM   Note: This office note was prepared with Insurance underwriter. Any transcriptional errors that result from this process are unintentional. Sleep

## 2017-04-29 NOTE — Progress Notes (Signed)
Please call to let his son know that the lab results show very mild dehydration and he is to call PCP to review, labs have been faxed

## 2017-05-04 LAB — POCT GLYCOSYLATED HEMOGLOBIN (HGB A1C): HEMOGLOBIN A1C: 7.3

## 2017-05-04 NOTE — Addendum Note (Signed)
Addended by: Ronita HippsFAGGE, Itay Mella L on: 05/04/2017 04:06 PM   Modules accepted: Orders

## 2017-07-22 ENCOUNTER — Inpatient Hospital Stay (HOSPITAL_COMMUNITY)
Admission: EM | Admit: 2017-07-22 | Discharge: 2017-07-26 | DRG: 871 | Disposition: A | Discharge status: Skilled Nursing Facility | Payer: Medicare Other | Attending: Internal Medicine | Admitting: Internal Medicine

## 2017-07-22 ENCOUNTER — Encounter (HOSPITAL_COMMUNITY): Payer: Self-pay | Admitting: *Deleted

## 2017-07-22 ENCOUNTER — Emergency Department (HOSPITAL_COMMUNITY): Payer: Medicare Other

## 2017-07-22 DIAGNOSIS — F0390 Unspecified dementia without behavioral disturbance: Secondary | ICD-10-CM | POA: Diagnosis present

## 2017-07-22 DIAGNOSIS — E43 Unspecified severe protein-calorie malnutrition: Secondary | ICD-10-CM | POA: Diagnosis present

## 2017-07-22 DIAGNOSIS — J189 Pneumonia, unspecified organism: Secondary | ICD-10-CM | POA: Diagnosis not present

## 2017-07-22 DIAGNOSIS — J439 Emphysema, unspecified: Secondary | ICD-10-CM | POA: Diagnosis present

## 2017-07-22 DIAGNOSIS — D649 Anemia, unspecified: Secondary | ICD-10-CM | POA: Diagnosis not present

## 2017-07-22 DIAGNOSIS — I129 Hypertensive chronic kidney disease with stage 1 through stage 4 chronic kidney disease, or unspecified chronic kidney disease: Secondary | ICD-10-CM | POA: Diagnosis present

## 2017-07-22 DIAGNOSIS — E871 Hypo-osmolality and hyponatremia: Secondary | ICD-10-CM | POA: Diagnosis present

## 2017-07-22 DIAGNOSIS — F329 Major depressive disorder, single episode, unspecified: Secondary | ICD-10-CM | POA: Diagnosis present

## 2017-07-22 DIAGNOSIS — Z9841 Cataract extraction status, right eye: Secondary | ICD-10-CM | POA: Diagnosis not present

## 2017-07-22 DIAGNOSIS — J690 Pneumonitis due to inhalation of food and vomit: Secondary | ICD-10-CM | POA: Diagnosis present

## 2017-07-22 DIAGNOSIS — E11649 Type 2 diabetes mellitus with hypoglycemia without coma: Secondary | ICD-10-CM | POA: Diagnosis present

## 2017-07-22 DIAGNOSIS — E1142 Type 2 diabetes mellitus with diabetic polyneuropathy: Secondary | ICD-10-CM | POA: Diagnosis present

## 2017-07-22 DIAGNOSIS — Z794 Long term (current) use of insulin: Secondary | ICD-10-CM

## 2017-07-22 DIAGNOSIS — G8250 Quadriplegia, unspecified: Secondary | ICD-10-CM | POA: Diagnosis present

## 2017-07-22 DIAGNOSIS — N1330 Unspecified hydronephrosis: Secondary | ICD-10-CM | POA: Diagnosis present

## 2017-07-22 DIAGNOSIS — J9811 Atelectasis: Secondary | ICD-10-CM | POA: Diagnosis present

## 2017-07-22 DIAGNOSIS — E870 Hyperosmolality and hypernatremia: Secondary | ICD-10-CM | POA: Diagnosis not present

## 2017-07-22 DIAGNOSIS — E1151 Type 2 diabetes mellitus with diabetic peripheral angiopathy without gangrene: Secondary | ICD-10-CM | POA: Diagnosis present

## 2017-07-22 DIAGNOSIS — Z87891 Personal history of nicotine dependence: Secondary | ICD-10-CM

## 2017-07-22 DIAGNOSIS — G92 Toxic encephalopathy: Secondary | ICD-10-CM | POA: Diagnosis present

## 2017-07-22 DIAGNOSIS — N179 Acute kidney failure, unspecified: Secondary | ICD-10-CM | POA: Diagnosis present

## 2017-07-22 DIAGNOSIS — A419 Sepsis, unspecified organism: Secondary | ICD-10-CM | POA: Diagnosis not present

## 2017-07-22 DIAGNOSIS — Z9842 Cataract extraction status, left eye: Secondary | ICD-10-CM

## 2017-07-22 DIAGNOSIS — D631 Anemia in chronic kidney disease: Secondary | ICD-10-CM | POA: Diagnosis present

## 2017-07-22 DIAGNOSIS — R531 Weakness: Secondary | ICD-10-CM

## 2017-07-22 DIAGNOSIS — N183 Chronic kidney disease, stage 3 unspecified: Secondary | ICD-10-CM | POA: Diagnosis present

## 2017-07-22 DIAGNOSIS — Z961 Presence of intraocular lens: Secondary | ICD-10-CM | POA: Diagnosis present

## 2017-07-22 DIAGNOSIS — E1122 Type 2 diabetes mellitus with diabetic chronic kidney disease: Secondary | ICD-10-CM | POA: Diagnosis present

## 2017-07-22 DIAGNOSIS — E860 Dehydration: Secondary | ICD-10-CM | POA: Diagnosis present

## 2017-07-22 DIAGNOSIS — Z79899 Other long term (current) drug therapy: Secondary | ICD-10-CM

## 2017-07-22 DIAGNOSIS — Z6823 Body mass index (BMI) 23.0-23.9, adult: Secondary | ICD-10-CM | POA: Diagnosis not present

## 2017-07-22 DIAGNOSIS — Z981 Arthrodesis status: Secondary | ICD-10-CM

## 2017-07-22 DIAGNOSIS — E785 Hyperlipidemia, unspecified: Secondary | ICD-10-CM | POA: Diagnosis present

## 2017-07-22 DIAGNOSIS — G929 Unspecified toxic encephalopathy: Secondary | ICD-10-CM

## 2017-07-22 DIAGNOSIS — I10 Essential (primary) hypertension: Secondary | ICD-10-CM | POA: Diagnosis not present

## 2017-07-22 LAB — URINALYSIS, ROUTINE W REFLEX MICROSCOPIC
Bilirubin Urine: NEGATIVE
Glucose, UA: NEGATIVE mg/dL
KETONES UR: 5 mg/dL — AB
Leukocytes, UA: NEGATIVE
Nitrite: NEGATIVE
PH: 5 (ref 5.0–8.0)
PROTEIN: NEGATIVE mg/dL
SQUAMOUS EPITHELIAL / LPF: NONE SEEN
Specific Gravity, Urine: 1.02 (ref 1.005–1.030)

## 2017-07-22 LAB — CBC WITH DIFFERENTIAL/PLATELET
Basophils Absolute: 0 10*3/uL (ref 0.0–0.1)
Basophils Relative: 0 %
EOS PCT: 0 %
Eosinophils Absolute: 0 10*3/uL (ref 0.0–0.7)
HCT: 38 % — ABNORMAL LOW (ref 39.0–52.0)
Hemoglobin: 12.4 g/dL — ABNORMAL LOW (ref 13.0–17.0)
LYMPHS ABS: 1.7 10*3/uL (ref 0.7–4.0)
LYMPHS PCT: 7 %
MCH: 32.6 pg (ref 26.0–34.0)
MCHC: 32.6 g/dL (ref 30.0–36.0)
MCV: 100 fL (ref 78.0–100.0)
MONO ABS: 1.9 10*3/uL — AB (ref 0.1–1.0)
MONOS PCT: 8 %
Neutro Abs: 21.2 10*3/uL — ABNORMAL HIGH (ref 1.7–7.7)
Neutrophils Relative %: 85 %
Platelets: 425 10*3/uL — ABNORMAL HIGH (ref 150–400)
RBC: 3.8 MIL/uL — ABNORMAL LOW (ref 4.22–5.81)
RDW: 13.1 % (ref 11.5–15.5)
WBC: 25 10*3/uL — ABNORMAL HIGH (ref 4.0–10.5)

## 2017-07-22 LAB — COMPREHENSIVE METABOLIC PANEL
ALBUMIN: 3.4 g/dL — AB (ref 3.5–5.0)
ALT: 37 U/L (ref 17–63)
AST: 42 U/L — AB (ref 15–41)
Alkaline Phosphatase: 60 U/L (ref 38–126)
Anion gap: 10 (ref 5–15)
BUN: 60 mg/dL — AB (ref 6–20)
CHLORIDE: 112 mmol/L — AB (ref 101–111)
CO2: 26 mmol/L (ref 22–32)
Calcium: 9.5 mg/dL (ref 8.9–10.3)
Creatinine, Ser: 2.6 mg/dL — ABNORMAL HIGH (ref 0.61–1.24)
GFR calc Af Amer: 24 mL/min — ABNORMAL LOW (ref >60)
GFR calc non Af Amer: 21 mL/min — ABNORMAL LOW (ref >60)
GLUCOSE: 53 mg/dL — AB (ref 65–99)
POTASSIUM: 3.9 mmol/L (ref 3.5–5.1)
Sodium: 148 mmol/L — ABNORMAL HIGH (ref 135–145)
Total Bilirubin: 0.7 mg/dL (ref 0.3–1.2)
Total Protein: 7.1 g/dL (ref 6.5–8.1)

## 2017-07-22 LAB — PROCALCITONIN: PROCALCITONIN: 0.45 ng/mL

## 2017-07-22 LAB — CBG MONITORING, ED: Glucose-Capillary: 122 mg/dL — ABNORMAL HIGH (ref 65–99)

## 2017-07-22 LAB — TSH: TSH: 1.376 u[IU]/mL (ref 0.350–4.500)

## 2017-07-22 LAB — LACTIC ACID, PLASMA: Lactic Acid, Venous: 1.1 mmol/L (ref 0.5–1.9)

## 2017-07-22 MED ORDER — ONDANSETRON HCL 4 MG/2ML IJ SOLN: 4.0000 mg | Freq: Four times a day (QID) | INTRAMUSCULAR | Status: DC | PRN

## 2017-07-22 MED ORDER — ACETAMINOPHEN 500 MG PO TABS
500.0000 mg | ORAL_TABLET | Freq: Four times a day (QID) | ORAL | Status: DC | PRN
  Administered 2017-07-24: 500 mg via ORAL
  Filled 2017-07-22: qty 1

## 2017-07-22 MED ORDER — SODIUM CHLORIDE 0.9 % IV SOLN
1000.0000 mL | INTRAVENOUS | Status: DC
  Administered 2017-07-22: 1000 mL via INTRAVENOUS

## 2017-07-22 MED ORDER — INSULIN ASPART 100 UNIT/ML ~~LOC~~ SOLN
0.0000 [IU] | SUBCUTANEOUS | Status: DC
  Administered 2017-07-23: 4 [IU] via SUBCUTANEOUS
  Administered 2017-07-23: 6 [IU] via SUBCUTANEOUS

## 2017-07-22 MED ORDER — DEXTROSE 5 % IV SOLN
500.0000 mg | INTRAVENOUS | Status: DC
  Administered 2017-07-22 – 2017-07-23 (×2): 500 mg via INTRAVENOUS
  Filled 2017-07-22 (×2): qty 500

## 2017-07-22 MED ORDER — FERROUS SULFATE 325 (65 FE) MG PO TABS
325.0000 mg | ORAL_TABLET | Freq: Every day | ORAL | Status: DC
  Administered 2017-07-24 – 2017-07-26 (×3): 325 mg via ORAL
  Filled 2017-07-22 (×3): qty 1

## 2017-07-22 MED ORDER — SODIUM CHLORIDE 0.9 % IV BOLUS (SEPSIS)
500.0000 mL | Freq: Once | INTRAVENOUS | Status: AC
  Administered 2017-07-22: 500 mL via INTRAVENOUS

## 2017-07-22 MED ORDER — AMLODIPINE BESYLATE 5 MG PO TABS
5.0000 mg | ORAL_TABLET | Freq: Every day | ORAL | Status: DC
  Administered 2017-07-24 – 2017-07-26 (×3): 5 mg via ORAL
  Filled 2017-07-22 (×3): qty 1

## 2017-07-22 MED ORDER — SODIUM CHLORIDE 0.9 % IV SOLN
1000.0000 mL | INTRAVENOUS | Status: DC
  Administered 2017-07-22 – 2017-07-25 (×3): 1000 mL via INTRAVENOUS

## 2017-07-22 MED ORDER — DEXTROSE 5 % IV SOLN
1.0000 g | INTRAVENOUS | Status: DC
  Administered 2017-07-23 (×2): 1 g via INTRAVENOUS
  Filled 2017-07-22 (×2): qty 10

## 2017-07-22 MED ORDER — HEPARIN SODIUM (PORCINE) 5000 UNIT/ML IJ SOLN
5000.0000 [IU] | Freq: Three times a day (TID) | INTRAMUSCULAR | Status: DC
  Administered 2017-07-23 – 2017-07-25 (×9): 5000 [IU] via SUBCUTANEOUS
  Filled 2017-07-22 (×10): qty 1

## 2017-07-22 MED ORDER — ONDANSETRON HCL 4 MG PO TABS: 4.0000 mg | ORAL_TABLET | Freq: Four times a day (QID) | ORAL | Status: DC | PRN

## 2017-07-22 MED ORDER — DEXTROSE 5 % IV SOLN: 1.0000 g | Freq: Two times a day (BID) | INTRAVENOUS | Status: DC

## 2017-07-22 MED ORDER — GUAIFENESIN ER 600 MG PO TB12
600.0000 mg | ORAL_TABLET | Freq: Two times a day (BID) | ORAL | Status: DC
  Administered 2017-07-23 – 2017-07-26 (×6): 600 mg via ORAL
  Filled 2017-07-22 (×6): qty 1

## 2017-07-22 NOTE — Progress Notes (Signed)
Pharmacy: ceftriaxone and azithromycin  Patient's an 83 y.o M presented to the ED on 07/22/17 with c/o hypoglycemia and AMS.  CXR showed atelectasis in both lower lungs.  To start ceftriaxone and azithromycin for suspected CAP.  - WBC 25, scr 2.60   Plan: - Azithromycin 500 mg IV q24h - Ceftriaxone 1 gm IV q24h - No renal adjustment is needed with the above two abx -- pharmacy will sign off - Re-consult us if need further assistance  Thank you for asking pharmacy to be part of this patient's care.  Dorna LeitzAnh Lamondre Wesche, PharmD, BCPS 07/22/2017 9:38 PM

## 2017-07-22 NOTE — ED Triage Notes (Signed)
Pt from PCP office with hypoglycemia (CBG 53). [facility] or [facility], however pt still received all diabetic medications. Pt last CBG was 77 per [facility]. Pt also had elevated WBC's

## 2017-07-22 NOTE — H&P (Addendum)
History and Physical    BADR PIEDRA XBM:[id number] DOB: [birth year] DOA: 07/22/2017  Referring MD/NP/PA: Johnn Hai, PA-C PCP: Leighton Ruff, MD Patient coming from: [name]  Chief Complaint: Weakness and tiredness  HPI: Javier Murillo is a 83 y.o. male with medical history significant of HTN, HLD, dementia, DM type II; who presented with complaints of weakness and tiredness. History is obtained from the patient's daughter and from review report as the patient has dementia. Over the last few days intermittantly the patient was noted to have increased weakness and lethargy where he normally noted to be alert. At baseline patient has severe dementia and is able to recognize family, able to poivot transfer, but normally gets around in a wheelchair. He reportedly did not eat his dinner last night and poor appetite This morning it was reported that it took 3 facility people to transfer him to his wheelchair. Due to the symptoms he was sent to a walk-in clinic where he was found to have an abnormal white blood cell count of 23.8 and blood sugar 53. Patient was given juice with improvement of blood sugar, but his mental status may not change. Patient's daughter states he had a runny nose and possible a cough occasionally to clear his throat. She also states that he was given his normal morning insulin, but he did not eat much of his food. Patient denies having any pain and states that he intermittently coughs.   ED Course: Upon admission into the emergency department patient was noted to be afebrile with respirations up to 26, and all other vital signs relatively within normal limits.Labs reveal WBC 25, hemoglobin 12.4, platelets 425, sodium 128, BUN 60, creatinine 2.6, and glucose 53. Urinalysis was negative for any signs of infection. Chest x-ray showed background emphysema with possible atelectasis or atelectatic pneumonia. Patient was given 500 mL bolus of normal saline IV  fluids along with empiric antibiotics of azithromycin and ceftriaxone.  Review of Systems: Review of Systems  Unable to perform ROS: Dementia    Past Medical History:  Diagnosis Date  . Arthritis   . Chronic low back pain   . Degenerative arthritis   . Depression   . Diabetes mellitus without complication ([facility])   . Diabetic peripheral neuropathy ([place])   . Gait disorder   . Hyperlipidemia   . Hypertension   . Kidney stones    only once  . Lumbosacral spondylosis   . Peripheral vascular disease ([place])   . Quadriplegia and quadriparesis ([place]) 08/20/2014    Past Surgical History:  Procedure Laterality Date  . ANTERIOR CERVICAL DECOMP/DISCECTOMY FUSION N/A 08/22/2014   Procedure: ANTERIOR CERVICAL DECOMPRESSION/DISCECTOMY FUSION, cervical three-four;  Surgeon: Hosie Spangle, MD;  Location: [facility];  Service: Neurosurgery;  Laterality: N/A;  C3-4 anterior cervical decompression with fusion plating and bonegraft  . APPENDECTOMY    . BACK SURGERY     x 7  . bone spur removed  right sholder    . bursitis Bilateral    olecranon I&D  . CATARACT EXTRACTION W/ INTRAOCULAR LENS  IMPLANT, BILATERAL    . EYE SURGERY     bilateral cataracts  . LUMBAR SPINE SURGERY     x7  . SHOULDER SURGERY Bilateral      reports that he quit smoking about 56 years ago. His smoking use included Cigarettes. He has a 1.25 pack-year smoking history. He has never used smokeless tobacco. He reports that he does not drink alcohol or use  drugs.  Allergies  Allergen Reactions  . Fluvirin [Influenza Vac Split Quad] Other (See Comments)    Gave patient flu    Family History  Problem Relation Age of Onset  . Cancer Mother   . Heart attack Father   . Dementia Sister     Prior to Admission medications   Medication Sig Start Date End Date Taking? Authorizing Provider  acetaminophen (TYLENOL) 500 MG tablet Take 1 tablet (500 mg total) by mouth every 6 (six) hours as needed (pain). 10/27/16  Yes  Patrecia Pour, MD  amLODipine (NORVASC) 5 MG tablet Take 1 tablet (5 mg total) by mouth daily. 09/17/15  Yes Oswald Hillock, MD  cholecalciferol (VITAMIN D) 1000 UNITS tablet Take 1,000 Units by mouth every evening.    Yes [provider]  ferrous sulfate 325 (65 FE) MG tablet Take 325 mg by mouth daily with breakfast.   Yes [provider]  HUMALOG 100 UNIT/ML injection Inject 8-11 Units into the skin daily. At 0700 per sliding scale- if BS is 0-200 = 8 units, 201-249 = 9 units, 250+= 11 units 03/26/15  Yes [provider]  insulin lispro (HUMALOG) 100 UNIT/ML injection Inject 10-14 Units into the skin 2 (two) times daily. At 1100 and 1600 per sliding scale - if BS is 0-200 = 10 units, 201-249 = 12 units, 250+ = 14 units   Yes [provider]  lisinopril (PRINIVIL,ZESTRIL) 5 MG tablet Take 5 mg by mouth daily. 06/26/17  Yes [provider]  TOUJEO SOLOSTAR 300 UNIT/ML SOPN Inject 6-27 Units into the skin daily. Inject 6 units in the morning at 0700 and inject 27 units at 1700 10/16/16  Yes [provider]  cefUROXime (CEFTIN) 250 MG tablet Take 1 tablet (250 mg total) by mouth 2 (two) times daily with a meal. Patient not taking: Reported on 04/29/2017 10/27/16   Patrecia Pour, MD  furosemide (LASIX) 20 MG tablet Take 1 tablet (20 mg total) by mouth daily. Patient not taking: Reported on 04/29/2017 09/17/15   Oswald Hillock, MD  lisinopril (PRINIVIL,ZESTRIL) 10 MG tablet Take 1 tablet (10 mg total) by mouth daily. Patient not taking: Reported on 07/22/2017 09/17/15   Oswald Hillock, MD    Physical Exam:  Constitutional: Elderly male in NAD, calm, comfortable Vitals:   07/22/17 1900 07/22/17 1915 07/22/17 1932 07/22/17 1945  BP:   (!) 125/59   Pulse: 87 83 89 84  Resp: (!) 21 (!) '22 20 20  '$ Temp:      TempSrc:      SpO2: 96% 95% 99% 97%   Eyes: PERRL, lids and conjunctivae normal ENMT: Mucous membranes are dry. Posterior pharynx clear of any  exudate or lesions.  Neck: normal, supple, no masses, no thyromegaly Respiratory: Mildly tachypneic with decreased overall aeration. No significant wheezes or rhonchi appreciated. Cardiovascular: Regular rate and rhythm, no murmurs / rubs / gallops. No extremity edema. 2+ pedal pulses. No carotid bruits.  Abdomen: no tenderness, no masses palpated. No hepatosplenomegaly. Bowel sounds positive.  Musculoskeletal: no clubbing / cyanosis. No joint deformity upper and lower extremities. Good ROM, no contractures. Normal muscle tone.  Skin: Poor skin turgor. No rashes, lesions, ulcers. No induration Neurologic: CN 2-12 grossly intact. Sensation intact, DTR normal. Strength 4+/5 in all 4.  Psychiatric: Poor memory. Alert and oriented to self. Normal mood    Labs on Admission: I have personally reviewed following labs and imaging studies  CBC:  Recent Labs Lab  07/22/17 1500  WBC 25.0*  NEUTROABS 21.2*  HGB 12.4*  HCT 38.0*  MCV 100.0  PLT 700*   Basic Metabolic Panel:  Recent Labs Lab 07/22/17 1444  NA 148*  K 3.9  CL 112*  CO2 26  GLUCOSE 53*  BUN 60*  CREATININE 2.60*  CALCIUM 9.5   GFR: CrCl cannot be calculated (Unknown ideal weight.). Liver Function Tests:  Recent Labs Lab 07/22/17 1444  AST 42*  ALT 37  ALKPHOS 60  BILITOT 0.7  PROT 7.1  ALBUMIN 3.4*   No results for input(s): LIPASE, AMYLASE in the last 168 hours. No results for input(s): AMMONIA in the last 168 hours. Coagulation Profile: No results for input(s): INR, PROTIME in the last 168 hours. Cardiac Enzymes: No results for input(s): CKTOTAL, CKMB, CKMBINDEX, TROPONINI in the last 168 hours. BNP (last 3 results) No results for input(s): PROBNP in the last 8760 hours. HbA1C: No results for input(s): HGBA1C in the last 72 hours. CBG:  Recent Labs Lab 07/22/17 2038  GLUCAP 122*   Lipid Profile: No results for input(s): CHOL, HDL, LDLCALC, TRIG, CHOLHDL, LDLDIRECT in the last 72  hours. Thyroid Function Tests: No results for input(s): TSH, T4TOTAL, FREET4, T3FREE, THYROIDAB in the last 72 hours. Anemia Panel: No results for input(s): VITAMINB12, FOLATE, FERRITIN, TIBC, IRON, RETICCTPCT in the last 72 hours. Urine analysis:    Component Value Date/Time   COLORURINE YELLOW 07/22/2017 1444   APPEARANCEUR CLEAR 07/22/2017 1444   LABSPEC 1.020 07/22/2017 1444   PHURINE 5.0 07/22/2017 1444   GLUCOSEU NEGATIVE 07/22/2017 1444   GLUCOSEU >=1000 (A) 06/04/2016 1618   HGBUR SMALL (A) 07/22/2017 1444   BILIRUBINUR NEGATIVE 07/22/2017 1444   KETONESUR 5 (A) 07/22/2017 1444   PROTEINUR NEGATIVE 07/22/2017 1444   UROBILINOGEN 0.2 06/04/2016 1618   NITRITE NEGATIVE 07/22/2017 1444   LEUKOCYTESUR NEGATIVE 07/22/2017 1444   Sepsis Labs: No results found for this or any previous visit (from the past 240 hour(s)).   Radiological Exams on Admission: Dg Chest 2 View  Result Date: 07/22/2017 CLINICAL DATA:  Hypoglycemia. Elevated white count. Lethargic. Smoking history. EXAM: CHEST  2 VIEW COMPARISON:  02/21/2016 FINDINGS: Heart size is normal. There is chronic aortic atherosclerosis. Lucency of the lungs in the upper chest suggests emphysema. There is new linear atelectasis in the right lower lung. There is mild patchy density in the left lower lobe that could represent atelectasis or mild left lower lobe pneumonia. No effusions. No acute bone finding. Chronic lower thoracic compression fracture. IMPRESSION: Background emphysema. Aortic atherosclerosis. Atelectasis in both lower lungs which could be simple atelectasis or atelectatic pneumonia. Electronically Signed   By: Nelson Chimes M.D.   On: 07/22/2017 14:46    EKG: Independently reviewed. Sinus rhythm with premature atrial complexes somewhat previous EKG.  Assessment/Plan Suspect community-acquired pneumonia: Acute. Patient reportedly may have had an intermittent cough. Chest x-ray showing emphysema with  atelectasis/atelectatic pneumonia. Due to elevated white blood cell count suspect possible infection. - Admit to a medsurg bed - Follow-up sputum and blood cultures and urine studies - Check procalcitonin - Incentive spirometry - Empiric antibiotics of ceftriaxone and azithromycin  - DuoNeb's prn shortness of breath/wheezing - mucinex  Leukocytosis/SIRS: WBC 25 with respiratory rate elevated up to 26. SIRS criteria are met, however lactic acid 1.1.Urinalysis showed not signs of infection. Chest x-ray as noted above. - Repeat CBC in a.m.   Hypoglycemia, diabetes mellitus with neuropathy: Acute. Initial blood glucose 53 on admission. This could be at  least part of the cause of patient's lethargy over the last day. - Hypoglycemic protocol - Held home insulin regimen - CBGs every 4 hours with custom SSI - Consider adjusting insulin regimen in a.m. if patient eating normally  Acute renal failure on chronic kidney disease stage III 2/2 dehydration: Patient presents with BUN 60 and creatinine 2.6. Previous baseline noted to be around a creatinine of 1.1- 1.5. - Check renal ultrasound - Monitor intake and output -  IV fluids at 100 ml/hr as tolerated - Hold nephrotoxic agents  Hypernatremia: Acute. Sodium 148 on admission. Suspect related to dehydration. - Recheck sodium in a.m.  Lethargy and generalized weakness: Acute. Likely related to patient's blood sugars and/or infection. - Check TSH  - Physical therapy   Essential hypertension  - continue amlodipine  Normocytic normochromic anemia:  Hemoglobin 12.4 on admission - Continue to monitor   Dementia: stable. Patient's daughter notes that dementia is severe but he is able to recognize family members, but may not know relation from time to time.   DVT prophylaxis: heparin  Code Status: Full Family Communication: Discuss plan of care with the patient's daughter over the phone.  Disposition Plan: Likely discharge to nursing facility  once medically stable.  Consults called: none Admission status: Inpatient   Norval Morton MD [facility] Pager [phone]   If 7PM-7AM, please contact night-coverage [email or web address] Password [facility]  07/22/2017, 8:45 PM

## 2017-07-22 NOTE — ED Provider Notes (Signed)
[facility]-EMERGENCY DEPT Provider Note   CSN: [id number] Arrival date & time: 07/22/17  1351     History   Chief Complaint Chief Complaint  Patient presents with  . Hypoglycemia    HPI Javier Murillo is a 83 y.o. male presenting with lethargy and weakness.  Level V caveat this patient has dementia. History was provided by EMS, patient, and daughter.  [facility], who stated that for the past day, he has had increased weakness and tiredness. Where he is normally very active, he was falling asleep at dinner last night. This morning, took 3 people to help transfer him from his bed to wheelchair. He was evaluated at [facility] walk[facility] clinic, where he had some abnormal labs. His blood sugar was 53, however he received his dose of insulin this morning without eating breakfast. Blood sugar elevated with juice, but mental status does not improve. White count of 23.8. No other labs or imaging was done. EMS was called, and patient was brought to the hospital for further evaluation. Patient denies fevers, chills, pain, chest pain, shortness of breath, nausea, vomiting, abdominal pain, pain with urination, or abnormal bowel movements. [facility]. Daughter states patient has had URI-like symptoms for the past several days, including a cough.   HPI  Past Medical History:  Diagnosis Date  . Arthritis   . Chronic low back pain   . Degenerative arthritis   . Depression   . Diabetes mellitus without complication (HCC)   . Diabetic peripheral neuropathy (HCC)   . Gait disorder   . Hyperlipidemia   . Hypertension   . Kidney stones    only once  . Lumbosacral spondylosis   . Peripheral vascular disease (HCC)   . Quadriplegia and quadriparesis (HCC) 08/20/2014    Patient Active Problem List   Diagnosis Date Noted  . UTI due to Klebsiella species   . Rhabdomyolysis 10/25/2016  . Elevated troponin 10/24/2016  . Acute encephalopathy 09/15/2015  .  Acute respiratory failure with hypoxia (HCC) 09/15/2015  . Bradycardia 09/15/2015  . Diabetes mellitus with neuropathy (HCC) 09/15/2015  . Anemia 01/30/2015  . Quadriplegia and quadriparesis (HCC) 08/20/2014  . Gait disorder 08/20/2014  . Weakness 07/23/2014  . Diabetic neuropathy (HCC) 07/23/2014  . Acute renal failure (HCC) 07/23/2014  . HTN (hypertension) 07/23/2014  . Dyslipidemia 07/23/2014    Past Surgical History:  Procedure Laterality Date  . ANTERIOR CERVICAL DECOMP/DISCECTOMY FUSION N/A 08/22/2014   Procedure: ANTERIOR CERVICAL DECOMPRESSION/DISCECTOMY FUSION, cervical three-four;  Surgeon: Hewitt Shortsobert W Nudelman, MD;  Location: [facility];  Service: Neurosurgery;  Laterality: N/A;  C3-4 anterior cervical decompression with fusion plating and bonegraft  . APPENDECTOMY    . BACK SURGERY     x 7  . bone spur removed  right sholder    . bursitis Bilateral    olecranon I&D  . CATARACT EXTRACTION W/ INTRAOCULAR LENS  IMPLANT, BILATERAL    . EYE SURGERY     bilateral cataracts  . LUMBAR SPINE SURGERY     x7  . SHOULDER SURGERY Bilateral        Home Medications    Prior to Admission medications   Medication Sig Start Date End Date Taking? Authorizing Provider  amLODipine (NORVASC) 5 MG tablet Take 1 tablet (5 mg total) by mouth daily. 09/17/15  Yes Meredeth IdeLama, Gagan S, MD  cholecalciferol (VITAMIN D) 1000 UNITS tablet Take 1,000 Units by mouth every evening.    Yes [provider]  ferrous sulfate 325 (65 FE) MG tablet Take 325 mg by mouth daily with breakfast.   Yes [provider]  HUMALOG 100 UNIT/ML injection Inject 8-11 Units into the skin daily. Per sliding scale- if 0-200 = 8 units, 201-249 = 9 units, 250+= 11 units 03/26/15  Yes [provider]  insulin lispro (HUMALOG) 100 UNIT/ML injection Inject 10-14 Units into the skin 2 (two) times daily. Per sliding scale - if 0-200 = 10 units, 201-249 = 12 units, 250+ = 14 units   Yes [provider]  lisinopril (PRINIVIL,ZESTRIL) 5 MG tablet Take 5 mg by mouth daily. 06/26/17  Yes [provider]  TOUJEO SOLOSTAR 300 UNIT/ML SOPN Inject 6-27 Units into the skin daily. Inject 6 units in the morning at 0700 and inject 27 units at 1700 10/16/16  Yes [provider]  acetaminophen (TYLENOL) 500 MG tablet Take 1 tablet (500 mg total) by mouth every 6 (six) hours as needed (pain). 10/27/16   Tyrone Nine, MD  cefUROXime (CEFTIN) 250 MG tablet Take 1 tablet (250 mg total) by mouth 2 (two) times daily with a meal. Patient not taking: Reported on 04/29/2017 10/27/16   Tyrone Nine, MD  furosemide (LASIX) 20 MG tablet Take 1 tablet (20 mg total) by mouth daily. Patient not taking: Reported on 04/29/2017 09/17/15   Meredeth Ide, MD  lisinopril (PRINIVIL,ZESTRIL) 10 MG tablet Take 1 tablet (10 mg total) by mouth daily. Patient not taking: Reported on 07/22/2017 09/17/15   Meredeth Ide, MD    Family History Family History  Problem Relation Age of Onset  . Cancer Mother   . Heart attack Father   . Dementia Sister     Social History Social History  Substance Use Topics  . Smoking status: Former Smoker    Packs/day: 1.00    Years: 10.00    Types: Cigarettes  . Smokeless tobacco: Never Used  . Alcohol use No     Comment: very rare     Allergies   Fluvirin [influenza vac split quad]   Review of Systems Review of Systems  Unable to perform ROS: Dementia     Physical Exam Updated Vital Signs BP 110/64   Pulse 86   Resp (!) 23   SpO2 93%   Physical Exam  Constitutional: He is oriented to person, place, and time. He appears well-developed and well-nourished. No distress.  Patient arouses to verbal stimuli, but easily falls back asleep.  HENT:  Head: Normocephalic and atraumatic.  Mouth/Throat: Uvula is midline, oropharynx is clear and moist and mucous membranes are normal.  Eyes: Pupils are equal, round, and reactive to light. Conjunctivae and EOM are  normal.  Neck: Normal range of motion.  Cardiovascular: Normal rate and intact distal pulses.   Irregularly irregular heart rate, not tachycardic.  Pulmonary/Chest: Effort normal and breath sounds normal. No respiratory distress. He has no wheezes. He has no rales. He exhibits no tenderness.  Abdominal: Soft. Bowel sounds are normal. He exhibits no distension and no mass. There is no tenderness. There is no rebound and no guarding.  Musculoskeletal: Normal range of motion.  Moves all extremities on command. Equal strength bilaterally.   Neurological: He is alert and oriented to person, place, and time. No cranial nerve deficit or sensory deficit.  Patient with a history of dementia. Daughter states patient does not appear more confused, but instead is more tired. Will follow simple commands.  Skin: Skin is warm and dry. No  rash noted.  Psychiatric: He has a normal mood and affect.  Nursing note and vitals reviewed.    ED Treatments / Results  Labs (all labs ordered are listed, but only abnormal results are displayed) Labs Reviewed  COMPREHENSIVE METABOLIC PANEL - Abnormal; Notable for the following:       Result Value   Sodium 148 (*)    Chloride 112 (*)    Glucose, Bld 53 (*)    BUN 60 (*)    Creatinine, Ser 2.60 (*)    Albumin 3.4 (*)    AST 42 (*)    GFR calc non Af Amer 21 (*)    GFR calc Af Amer 24 (*)    All other components within normal limits  URINALYSIS, ROUTINE W REFLEX MICROSCOPIC    EKG  EKG Interpretation  Date/Time:  Thursday July 22 2017 14:49:08 EDT Ventricular Rate:  78 PR Interval:    QRS Duration: 96 QT Interval:  347 QTC Calculation: 396 R Axis:   -23 Text Interpretation:  Sinus rhythm Atrial premature complexes Borderline short PR interval Abnormal R-wave progression, early transition Inferior infarct, old Consider anterior infarct Confirmed by Raeford Razor [phone]) on 07/22/2017 3:23:26 PM       Radiology Dg Chest 2 View  Result Date:  07/22/2017 CLINICAL DATA:  Hypoglycemia. Elevated white count. Lethargic. Smoking history. EXAM: CHEST  2 VIEW COMPARISON:  02/21/2016 FINDINGS: Heart size is normal. There is chronic aortic atherosclerosis. Lucency of the lungs in the upper chest suggests emphysema. There is new linear atelectasis in the right lower lung. There is mild patchy density in the left lower lobe that could represent atelectasis or mild left lower lobe pneumonia. No effusions. No acute bone finding. Chronic lower thoracic compression fracture. IMPRESSION: Background emphysema. Aortic atherosclerosis. Atelectasis in both lower lungs which could be simple atelectasis or atelectatic pneumonia. Electronically Signed   By: Paulina Fusi M.D.   On: 07/22/2017 14:46    Procedures Procedures (including critical care time)  Medications Ordered in ED Medications  sodium chloride 0.9 % bolus 500 mL (500 mLs Intravenous New Bag/Given 07/22/17 1545)    Followed by  0.9 %  sodium chloride infusion (1,000 mLs Intravenous New Bag/Given 07/22/17 1546)     Initial Impression / Assessment and Plan / ED Course  I have reviewed the triage vital signs and the nursing notes.  Pertinent labs & imaging results that were available during my care of the patient were reviewed by me and considered in my medical decision making (see chart for details).     Patient presents with one-day history of weakness and increased tiredness. Physical exam shows no unilateral weakness. Vital signs stable, patient afebrile. CBC with differential at walk-in clinic shows elevated white count. No other labs or imaging was done. Will order CMP, UA, chest x-ray, and EKG. Will start IV fluids.  EKG shows PACs, but no sign of A. Fib. Patient without chest pain or shortness of breath, and this is likely not the source of his symptoms. Chest x-ray shows bibasilar atelectasis or possible PNA, but is similar to last chest x-ray. CMP shows worsening kidney function with  serum creatinine of 2.6, double from baseline. Of note glucose is back down to 53. UA pending.   Pt signed out to [employer], [employer].   Final Clinical Impressions(s) / ED Diagnoses   Final diagnoses:  AKI (acute kidney injury) (HCC)  Community acquired pneumonia, unspecified laterality  Weakness    New Prescriptions New Prescriptions  No medications on file     Alveria Apley, Cordelia Poche 07/23/17 1737    Raeford Razor, MD 08/06/17 [phone]

## 2017-07-22 NOTE — ED Provider Notes (Signed)
Reevaluation.   Pt has elevated wbc count of 25 and elevation of BUn and creatine.  Pt presented here for weakness.  Chest xray shows possible pneumonia.  Blood cultures pending.  IV fluids given.   I will start Rocephin and consult hospitalist  for admission   Osie CheeksSofia, Leslie K, PA-C 07/22/17 1831    Arby BarrettePfeiffer, Marcy, MD 07/29/17 1620

## 2017-07-22 NOTE — ED Notes (Signed)
Bed: [id number] Expected date:  Expected time:  Means of arrival:  Comments: EMS- elderly M, hypoglycemia/altered mental status

## 2017-07-22 NOTE — ED Notes (Signed)
Daughter- [phone] Steward DroneBrenda

## 2017-07-23 ENCOUNTER — Inpatient Hospital Stay (HOSPITAL_COMMUNITY): Payer: Medicare Other

## 2017-07-23 DIAGNOSIS — G92 Toxic encephalopathy: Secondary | ICD-10-CM

## 2017-07-23 DIAGNOSIS — J189 Pneumonia, unspecified organism: Secondary | ICD-10-CM

## 2017-07-23 DIAGNOSIS — A419 Sepsis, unspecified organism: Principal | ICD-10-CM

## 2017-07-23 DIAGNOSIS — N183 Chronic kidney disease, stage 3 unspecified: Secondary | ICD-10-CM | POA: Diagnosis present

## 2017-07-23 DIAGNOSIS — N179 Acute kidney failure, unspecified: Secondary | ICD-10-CM

## 2017-07-23 DIAGNOSIS — I10 Essential (primary) hypertension: Secondary | ICD-10-CM

## 2017-07-23 DIAGNOSIS — F0390 Unspecified dementia without behavioral disturbance: Secondary | ICD-10-CM | POA: Diagnosis present

## 2017-07-23 DIAGNOSIS — G929 Unspecified toxic encephalopathy: Secondary | ICD-10-CM

## 2017-07-23 DIAGNOSIS — E870 Hyperosmolality and hypernatremia: Secondary | ICD-10-CM

## 2017-07-23 LAB — CBC
HEMATOCRIT: 36.7 % — AB (ref 39.0–52.0)
HEMOGLOBIN: 11.8 g/dL — AB (ref 13.0–17.0)
MCH: 31.9 pg (ref 26.0–34.0)
MCHC: 32.2 g/dL (ref 30.0–36.0)
MCV: 99.2 fL (ref 78.0–100.0)
Platelets: 351 10*3/uL (ref 150–400)
RBC: 3.7 MIL/uL — AB (ref 4.22–5.81)
RDW: 12.9 % (ref 11.5–15.5)
WBC: 20.2 10*3/uL — AB (ref 4.0–10.5)

## 2017-07-23 LAB — GLUCOSE, CAPILLARY
GLUCOSE-CAPILLARY: 188 mg/dL — AB (ref 65–99)
GLUCOSE-CAPILLARY: 189 mg/dL — AB (ref 65–99)
GLUCOSE-CAPILLARY: 219 mg/dL — AB (ref 65–99)
GLUCOSE-CAPILLARY: 274 mg/dL — AB (ref 65–99)
GLUCOSE-CAPILLARY: 346 mg/dL — AB (ref 65–99)
GLUCOSE-CAPILLARY: 464 mg/dL — AB (ref 65–99)

## 2017-07-23 LAB — BASIC METABOLIC PANEL
ANION GAP: 8 (ref 5–15)
BUN: 50 mg/dL — ABNORMAL HIGH (ref 6–20)
CHLORIDE: 113 mmol/L — AB (ref 101–111)
CO2: 23 mmol/L (ref 22–32)
CREATININE: 2.13 mg/dL — AB (ref 0.61–1.24)
Calcium: 8.7 mg/dL — ABNORMAL LOW (ref 8.9–10.3)
GFR calc non Af Amer: 27 mL/min — ABNORMAL LOW (ref >60)
GFR, EST AFRICAN AMERICAN: 31 mL/min — AB (ref >60)
Glucose, Bld: 205 mg/dL — ABNORMAL HIGH (ref 65–99)
POTASSIUM: 4.1 mmol/L (ref 3.5–5.1)
SODIUM: 144 mmol/L (ref 135–145)

## 2017-07-23 LAB — MRSA PCR SCREENING: MRSA BY PCR: NEGATIVE

## 2017-07-23 LAB — STREP PNEUMONIAE URINARY ANTIGEN: STREP PNEUMO URINARY ANTIGEN: NEGATIVE

## 2017-07-23 MED ORDER — GLUCERNA SHAKE PO LIQD
237.0000 mL | Freq: Three times a day (TID) | ORAL | Status: DC
  Administered 2017-07-23 – 2017-07-25 (×6): 237 mL via ORAL
  Filled 2017-07-23 (×10): qty 237

## 2017-07-23 MED ORDER — HALOPERIDOL LACTATE 5 MG/ML IJ SOLN: 1.0000 mg | Freq: Four times a day (QID) | INTRAMUSCULAR | Status: DC | PRN

## 2017-07-23 MED ORDER — INSULIN ASPART 100 UNIT/ML ~~LOC~~ SOLN
12.0000 [IU] | Freq: Once | SUBCUTANEOUS | Status: AC
  Administered 2017-07-23: 12 [IU] via SUBCUTANEOUS

## 2017-07-23 NOTE — Progress Notes (Signed)
Initial Nutrition Assessment  DOCUMENTATION CODES:   Not applicable  INTERVENTION:   Glucerna Shake po TID, each supplement provides 220 kcal and 10 grams of protein  NUTRITION DIAGNOSIS:   Inadequate oral intake related to poor appetite as evidenced by per patient/family report.  GOAL:   Patient will meet greater than or equal to 90% of their needs  MONITOR:   PO intake, Supplement acceptance, Labs, Weight trends  REASON FOR ASSESSMENT:   Malnutrition Screening Tool   ASSESSMENT:   Pt with PMH of HTN, dementia, CKD III, and DM. Prior to admission facility people at SNF had a hard time transferring pt. He was sent to walk in clinic where his blood sugar was noted to be 53. Presents this admission with bilateral lower lobes pneumonia.   Pt pleasant but confused upon interview. Family at bedside to give nutrition history.  Family reports, pt currently consuming 3 meals a day at SNF with no known decrease in appetite prior to admission. MD notes in H&P, daughter reports pt did not eat his dinner last night. SLP to see pt in regard to possible swallowing difficulties. Will monitor for recommendations.   Records indicate pt has lost  4.5 % of body wt since may. This is not significant.   Nutrition-Focused physical exam completed. Findings are no fat depletion, no muscle depletion, and no edema.   Medications reviewed and include: ferrous sulfate, SSI, NS @ 100 ml/hr, IV abx Labs reviewed: Cl (H), CBG 205  BUN 50 (H) Creatinine 2.13 (H) Calcium 8.7 (L)  Diet Order:  Diet Carb Modified Fluid consistency: Thin; Room service appropriate? Yes  Skin:  Reviewed, no issues  Last BM:  07/21/17  Height:   Ht Readings from Last 1 Encounters:  04/29/17 5\' 11"  (1.803 m)    Weight:   Wt Readings from Last 1 Encounters:  07/23/17 167 lb (75.8 kg)    Ideal Body Weight:  78.2 kg  BMI:  Body mass index is 23.29 kg/m.  Estimated Nutritional Needs:   Kcal:  1900-2100 (25-28  kcal/kg)  Protein:  100-110 grams (1.3-1.5 g/kg)  Fluid:  >1.9 L/day  EDUCATION NEEDS:   No education needs identified at this time  Vanessa Kick RD, LDN Clinical Nutrition Pager # - [phone]

## 2017-07-23 NOTE — Evaluation (Signed)
Physical Therapy Evaluation Patient Details Name: Javier Murillo MRN: [id number] DOB: 1930/04/04 Today's Date: 07/23/2017   History of Present Illness  Pt. is a 83 y.o. male with PMHx of HTN, HLD, dementia, DM type II; who presented with complaints of weakness and tiredness. Over the last few days intermittantly the patient was noted to have increased weakness and lethargy where he normally noted to be alert. Pt admitted for sepsis likely caused by pneumonia.  Clinical Impression  Pt admitted with the above conditions. Pt currently with functional limitations due to the deficits listed below. Pt will benefit from skilled PT to increase their independence and safety with mobility to allow for discharge. Pt has cognitive impairments so baseline functioning and history was provided by son-in-law. Pt +2 max A for bed mobility and transfer.    Follow Up Recommendations SNF    Equipment Recommendations  None recommended by PT    Recommendations for Other Services       Precautions / Restrictions Precautions Precautions: Fall Restrictions Weight Bearing Restrictions: No      Mobility  Bed Mobility Overal bed mobility: Needs Assistance Bed Mobility: Supine to Sit     Supine to sit: Max assist;HOB elevated;+2 for physical assistance     General bed mobility comments: Pt requires +2 for simultaneous LE and trunk management when transitioning to sit at EOB   Transfers Overall transfer level: Needs assistance Equipment used: Rolling walker (2 wheeled) Transfers: Stand Pivot Transfers;Sit to/from Stand Sit to Stand: Max assist;+2 physical assistance Stand pivot transfers: Mod assist;+2 safety/equipment       General transfer comment: Pt requires RW and +2 max assist to stand and steady, pt requires +2 to stand pivot to manage equipment and for safety  Ambulation/Gait             General Gait Details: Not attempted at this time   Stairs            Wheelchair  Mobility    Modified Rankin (Stroke Patients Only)       Balance Overall balance assessment: Needs assistance Sitting-balance support: Bilateral upper extremity supported;Feet supported Sitting balance-Leahy Scale: Poor Sitting balance - Comments: Pt able to intermittently support himself at times while sitting at EOB but mostly requires assist to steady trunk  Postural control: Posterior lean Standing balance support: Bilateral upper extremity supported Standing balance-Leahy Scale: Poor Standing balance comment: Pt requires bilat UE support as well as assit to stand and steady                              Pertinent Vitals/Pain Pain Assessment: No/denies pain    Home Living Family/patient expects to be discharged to:: Other (Comment)                 Additional Comments: Pt was living at a memory care [facility] at [facility]    Prior Function Level of Independence: Needs assistance   Gait / Transfers Assistance Needed: Pt. mostly wheelchair bound, pt performs pivot transfer with assist prior to hospital admission   ADL's / Homemaking Assistance Needed: Pt requires assist for ADLS        Hand Dominance        Extremity/Trunk Assessment   Upper Extremity Assessment Upper Extremity Assessment: Difficult to assess due to impaired cognition    Lower Extremity Assessment Lower Extremity Assessment: Difficult to assess due to impaired cognition (Pt has difficulty following commands and is  confused)       Communication   Communication: HOH  Cognition Arousal/Alertness: Awake/alert Behavior During Therapy: WFL for tasks assessed/performed Overall Cognitive Status: Impaired/Different from baseline                                 General Comments: Pt has baseline dementia but son in law reports he is even more confused since admission, pt with hx of dementia and appears confused today      General Comments      Exercises      Assessment/Plan    PT Assessment Patient needs continued PT services  PT Problem List Decreased strength;Decreased mobility;Decreased safety awareness;Decreased range of motion;Decreased coordination;Decreased activity tolerance;Decreased balance;Decreased knowledge of use of DME       PT Treatment Interventions DME instruction;Therapeutic activities;Therapeutic exercise;Patient/family education;Balance training;Functional mobility training;Wheelchair mobility training    PT Goals (Current goals can be found in the Care Plan section)  Acute Rehab PT Goals Patient Stated Goal: Pt would like to return to baseline function  PT Goal Formulation: With family Time For Goal Achievement: 08/06/17 Potential to Achieve Goals: Fair    Frequency Min 3X/week   Barriers to discharge        Co-evaluation               AM-PAC PT "6 Clicks" Daily Activity  Outcome Measure Difficulty turning over in bed (including adjusting bedclothes, sheets and blankets)?: Unable Difficulty moving from lying on back to sitting on the side of the bed? : Unable Difficulty sitting down on and standing up from a chair with arms (e.g., wheelchair, bedside commode, etc,.)?: Unable Help needed moving to and from a bed to chair (including a wheelchair)?: A Lot Help needed walking in hospital room?: A Lot Help needed climbing 3-5 steps with a railing? : Total 6 Click Score: 8    End of Session Equipment Utilized During Treatment: Gait belt Activity Tolerance: Patient tolerated treatment well Patient left: in chair;with chair alarm set;with family/visitor present;with call bell/phone within reach Nurse Communication: Mobility status PT Visit Diagnosis: Muscle weakness (generalized) (M62.81);Unsteadiness on feet (R26.81)    Time: 1610-9604 PT Time Calculation (min) (ACUTE ONLY): 30 min   Charges:   PT Evaluation $PT Eval Moderate Complexity: 1 Mod     PT G Codes:        Javier Murillo, SPT   Javier Murillo 07/23/2017, 1:10 PM

## 2017-07-23 NOTE — Progress Notes (Signed)
Inpatient Diabetes Program Recommendations  AACE/ADA: New Consensus Statement on Inpatient Glycemic Control (2015)  Target Ranges:  Prepandial:   less than 140 mg/dL      Peak postprandial:   less than 180 mg/dL (1-2 hours)      Critically ill patients:  140 - 180 mg/dL   Lab Results  Component Value Date   GLUCAP 219 (H) 07/23/2017   HGBA1C 7.3 04/29/2017     Agree with current hospital DM meds.  However, if fasting BS continues to be elevated, please consider adding a low dose Lantus.  Mellissa Kohut RD, CDE. M.Ed. Pager [phone] Inpatient Diabetes Coordinator

## 2017-07-23 NOTE — Progress Notes (Signed)
[facility] PROGRESS NOTE    Progress Note  KENTRAVIOUS Murillo  ZOX:[id number] DOB: Oct 18, 1930 DOA: 07/22/2017 PCP: Juluis Rainier, MD     Brief Narrative:   Javier Murillo is an 83 y.o. male past medical history significant for essential hypertension, advanced dementia diabetes mellitus type 2 presents with complaints of weakness and tiredness. She presents with increased weakness and lethargy over the last few days, accompanied by an intermittent cough chest x-ray showed emphysema with possible pneumonia elevated white count  Assessment/Plan:   Sepsis (HCC) Possibly due to bilateral lower lobes pneumonia: Culture data is pending, urine antigens are pending. He started empirically on IV Rocephin and azithromycin and Duonebs. His remain afebrile, there has been a slight improvement in her mild leukocytosis. Lactic acid is less than 2. Family relates that he had some cough with eating or drinking in the past, will get a swallowing evaluation.  Hypoglycemia/diabetes mellitus type 2 with peripheral neuropathy: Continue check CBGs every 4 hours, blood glucose has come up. She was started on sliding scale overnight.  Acute kidney injury on chronic renal disease stage III: Baseline creatinine 1.1-1.5. Renal ultrasound showed mild left hydronephrosis and no stones. She was started on IV fluids nephrotoxic agents were held. His creatinine is improving with IV fluid hydration likely prerenal in etiology.  Hyponatremia: Likely due to dehydration, resolved with IV fluids.  Toxic encephalopathy: Likely related to infectious etiology.  Essential hypertension: Continue amlodipine.  Normocytic anemia: Hemoglobin is stable.  Advanced dementia: Stable continue Haldol when necessary for agitation.   DVT prophylaxis: lovenxo Family Communication:none Disposition Plan/Barrier to D/C: home in 3 days Code Status:     Code Status Orders        Start     Ordered   07/22/17  2130  Full code  Continuous     07/22/17 2134    Code Status History    Date Active Date Inactive Code Status Order ID Comments User Context   10/24/2016  8:53 PM 10/27/2016  5:14 PM Full Code [id number]  Cathren Harsh, MD Inpatient   09/15/2015  7:08 PM 09/17/2015  2:32 PM Full Code [id number]  Dorothea Ogle, MD Inpatient   08/28/2014  2:27 PM 08/28/2014  2:27 PM Full Code [id number]  Charlton Amor, PA-C Inpatient   08/28/2014  2:27 PM 09/11/2014  5:13 PM Full Code [id number]  Charlton Amor, PA-C Inpatient   08/22/2014 11:34 PM 08/28/2014  2:27 PM Full Code [id number]  Hewitt Shorts, MD Inpatient   07/23/2014 11:58 PM 07/25/2014  3:06 PM Full Code [id number]  Alison Murray, MD Inpatient    Advance Directive Documentation     Most Recent Value  Type of Advance Directive  Healthcare Power of Attorney  Pre-existing out of facility DNR order (yellow form or pink MOST form)  -  "MOST" Form in Place?  -        IV Access:    Peripheral IV   Procedures and diagnostic studies:   X-ray Chest Pa And Lateral  Result Date: 07/23/2017 CLINICAL DATA:  Sepsis EXAM: CHEST  2 VIEW COMPARISON:  07/22/17 FINDINGS: Cardiac shadow is stable. Bibasilar atelectatic changes are noted slightly increased from the prior exam. No sizable effusion is seen. No acute bony abnormality is noted. IMPRESSION: Slight increase in bibasilar atelectasis. Electronically Signed   By: Alcide Clever M.D.   On: 07/23/2017 08:09   Dg Chest 2 View  Result Date: 07/22/2017 CLINICAL DATA:  Hypoglycemia. Elevated white  count. Lethargic. Smoking history. EXAM: CHEST  2 VIEW COMPARISON:  02/21/2016 FINDINGS: Heart size is normal. There is chronic aortic atherosclerosis. Lucency of the lungs in the upper chest suggests emphysema. There is new linear atelectasis in the right lower lung. There is mild patchy density in the left lower lobe that could represent atelectasis or mild left lower lobe pneumonia. No effusions. No acute  bone finding. Chronic lower thoracic compression fracture. IMPRESSION: Background emphysema. Aortic atherosclerosis. Atelectasis in both lower lungs which could be simple atelectasis or atelectatic pneumonia. Electronically Signed   By: Paulina Fusi M.D.   On: 07/22/2017 14:46   US Renal  Result Date: 07/23/2017 CLINICAL DATA:  Acute renal failure EXAM: RENAL / URINARY TRACT ULTRASOUND COMPLETE COMPARISON:  CT 11/10/2010 FINDINGS: Right Kidney: Length: 7.9 cm. Negative for hydronephrosis. Tiny hypoechoic nodule upper pole right kidney measuring 8 mm. Left Kidney: Length: 10.1 cm. Echogenicity within normal limits. Mild left hydronephrosis. Bladder: Appears normal for degree of bladder distention. IMPRESSION: 1. Asymmetric renal size, right smaller than left, raises possibility for mild right renal atrophy. 2. Mild left hydronephrosis 3. 8 mm hypoechoic nodule upper pole right kidney, possible small cyst with scattered echoes but further evaluation difficult due to small size. Electronically Signed   By: Jasmine Pang M.D.   On: 07/23/2017 01:26     Medical Consultants:    None.  Anti-Infectives:   Rocephin and azithromycin.  Subjective:    Javier Murillo laying comfortably in bed sleeping  Objective:    Vitals:   07/22/17 1915 07/22/17 1932 07/22/17 1945 07/22/17 2226  BP:  (!) 125/59  (!) 122/42  Pulse: 83 89 84   Resp: (!) 22 20 20  (!) 24  Temp:    99.1 F (37.3 C)  TempSrc:    Oral  SpO2: 95% 99% 97% 96%    Intake/Output Summary (Last 24 hours) at 07/23/17 1517 Last data filed at 07/22/17 2030  Gross per 24 hour  Intake              750 ml  Output                0 ml  Net              750 ml   There were no vitals filed for this visit.  Exam: General exam: No acute distress. Respiratory system: Good air movement clear to auscultation. Cardiovascular system: Regular rate and rhythm with positive S1-S2. Gastrointestinal system: Positive bowel sounds soft nontender  nondistended. Central nervous system: Sleepy. Extremities: No lower extremity edema Skin: No rashes, lesions or ulcers  Data Reviewed:    Labs: Basic Metabolic Panel:  Recent Labs Lab 07/22/17 1444 07/23/17 0709  NA 148* 144  K 3.9 4.1  CL 112* 113*  CO2 26 23  GLUCOSE 53* 205*  BUN 60* 50*  CREATININE 2.60* 2.13*  CALCIUM 9.5 8.7*   GFR CrCl cannot be calculated (Unknown ideal weight.). Liver Function Tests:  Recent Labs Lab 07/22/17 1444  AST 42*  ALT 37  ALKPHOS 60  BILITOT 0.7  PROT 7.1  ALBUMIN 3.4*   No results for input(s): LIPASE, AMYLASE in the last 168 hours. No results for input(s): AMMONIA in the last 168 hours. Coagulation profile No results for input(s): INR, PROTIME in the last 168 hours.  CBC:  Recent Labs Lab 07/22/17 1500 07/23/17 0709  WBC 25.0* 20.2*  NEUTROABS 21.2*  --   HGB 12.4* 11.8*  HCT 38.0* 36.7*  MCV 100.0 99.2  PLT 425* 351   Cardiac Enzymes: No results for input(s): CKTOTAL, CKMB, CKMBINDEX, TROPONINI in the last 168 hours. BNP (last 3 results) No results for input(s): PROBNP in the last 8760 hours. CBG:  Recent Labs Lab 07/22/17 2038 07/23/17 0010 07/23/17 0353 07/23/17 0732  GLUCAP 122* 274* 189* 188*   D-Dimer: No results for input(s): DDIMER in the last 72 hours. Hgb A1c: No results for input(s): HGBA1C in the last 72 hours. Lipid Profile: No results for input(s): CHOL, HDL, LDLCALC, TRIG, CHOLHDL, LDLDIRECT in the last 72 hours. Thyroid function studies:  Recent Labs  07/22/17 2231  TSH 1.376   Anemia work up: No results for input(s): VITAMINB12, FOLATE, FERRITIN, TIBC, IRON, RETICCTPCT in the last 72 hours. Sepsis Labs:  Recent Labs Lab 07/22/17 1500 07/22/17 2047 07/22/17 2231 07/23/17 0709  PROCALCITON  --   --  0.45  --   WBC 25.0*  --   --  20.2*  LATICACIDVEN  --  1.1  --   --    Microbiology Recent Results (from the past 240 hour(s))  MRSA PCR Screening     Status: None    Collection Time: 07/22/17 11:35 PM  Result Value Ref Range Status   MRSA by PCR NEGATIVE NEGATIVE Final    Comment:        The GeneXpert MRSA Assay (FDA approved for NASAL specimens only), is one component of a comprehensive MRSA colonization surveillance program. It is not intended to diagnose MRSA infection nor to guide or monitor treatment for MRSA infections.      Medications:   . amLODipine  5 mg Oral Daily  . ferrous sulfate  325 mg Oral Q breakfast  . guaiFENesin  600 mg Oral BID  . heparin  5,000 Units Subcutaneous Q8H  . insulin aspart  0-8 Units Subcutaneous Q4H   Continuous Infusions: . sodium chloride 1,000 mL (07/22/17 2321)  . azithromycin Stopped (07/22/17 2000)  . cefTRIAXone (ROCEPHIN)  IV 1 g (07/23/17 0328)      LOS: 1 day   Marinda Elk  [facility] Pager [phone]  *Please refer to [email or web address], password [email or web address] to get updated schedule on who will round on this patient, as hospitalists switch teams weekly. If 7PM-7AM, please contact night-coverage at [email or web address], password [email or web address] for any overnight needs.  07/23/2017, 8:14 AM

## 2017-07-23 NOTE — Evaluation (Addendum)
Clinical/Bedside Swallow Evaluation Patient Details  Name: Javier Murillo MRN: [id number] Date of Birth: [birth year]  Today's Date: 07/23/2017 Time: SLP Start Time (ACUTE ONLY): 1432 SLP Stop Time (ACUTE ONLY): 1528 SLP Time Calculation (min) (ACUTE ONLY): 56 min  Past Medical History:  Past Medical History:  Diagnosis Date  . Arthritis   . Chronic low back pain   . Degenerative arthritis   . Depression   . Diabetes mellitus without complication (HCC)   . Diabetic peripheral neuropathy (HCC)   . Gait disorder   . Hyperlipidemia   . Hypertension   . Kidney stones    only once  . Lumbosacral spondylosis   . Peripheral vascular disease (HCC)   . Quadriplegia and quadriparesis (HCC) 08/20/2014   Past Surgical History:  Past Surgical History:  Procedure Laterality Date  . ANTERIOR CERVICAL DECOMP/DISCECTOMY FUSION N/A 08/22/2014   Procedure: ANTERIOR CERVICAL DECOMPRESSION/DISCECTOMY FUSION, cervical three-four;  Surgeon: Hewitt Shorts, MD;  Location: [facility];  Service: Neurosurgery;  Laterality: N/A;  C3-4 anterior cervical decompression with fusion plating and bonegraft  . APPENDECTOMY    . BACK SURGERY     x 7  . bone spur removed  right sholder    . bursitis Bilateral    olecranon I&D  . CATARACT EXTRACTION W/ INTRAOCULAR LENS  IMPLANT, BILATERAL    . EYE SURGERY     bilateral cataracts  . LUMBAR SPINE SURGERY     x7  . SHOULDER SURGERY Bilateral    HPI:  Javier WRITT a 83 y.o.malewith medical history significant ofHTN, HLD, dementia, DM type II;who presented with complaints of weakness and tiredness. Chest x ray with atelectasis vs PNA.    Assessment / Plan / Recommendation Clinical Impression  Pt presents with a suspected mild to moderate pharyngeal dysphagia of unknown etiology. Pt with frequent throat clearing which was isolated during PO consumption and occured across all consistencies tested including thickened liquids concerning for reduced  airway protection. Pt with intermittent wet vocal quality throughout PO consumption. Pts daughter at bedside who denies premorbid dysphagia but admits throat clearing occurs frequently during eating and drinking. Recommend dysphagia 3 (mechanical soft) and thin liquids with medicines whole in puree. Full supervision warranted with all PO to maximize swallow safety. Recommend MBS to objectively assess swallow function. MBS to be completed next date as radiology scheduling prohibits completion this afternoon.   SLP Visit Diagnosis: Dysphagia, unspecified (R13.10)    Aspiration Risk  Mild aspiration risk;Moderate aspiration risk    Diet Recommendation   Dysphagia 3 (mechanical soft) thin liquids  Medication Administration: Whole meds with puree    Other  Recommendations Oral Care Recommendations: Oral care BID   Follow up Recommendations Other (comment) (memory care unit )      Frequency and Duration min 2x/week  1 week       Prognosis Prognosis for Safe Diet Advancement: Good      Swallow Study   General Date of Onset: 07/22/17 HPI: Javier Murillo a 83 y.o.malewith medical history significant ofHTN, HLD, dementia, DM type II;who presented with complaints of weakness and tiredness. Chest x ray with atelectasis vs PNA.  Type of Study: Bedside Swallow Evaluation Previous Swallow Assessment: SLE intervention, no swallow hx noted Diet Prior to this Study: Regular;Thin liquids Temperature Spikes Noted: No Respiratory Status: Room air History of Recent Intubation: No Behavior/Cognition: Alert;Confused;Cooperative Oral Cavity Assessment: Within Functional Limits Oral Cavity - Dentition: Missing dentition Vision: Functional for self-feeding Self-Feeding Abilities: Needs set  up Patient Positioning: Upright in chair Baseline Vocal Quality: Low vocal intensity Volitional Cough: Cognitively unable to elicit Volitional Swallow: Unable to elicit    Oral/Motor/Sensory Function  Overall Oral Motor/Sensory Function: Generalized oral weakness   Ice Chips Ice chips: Impaired Presentation: Spoon Oral Phase Functional Implications: Prolonged oral transit Pharyngeal Phase Impairments: Suspected delayed Swallow;Multiple swallows;Wet Vocal Quality;Throat Clearing - Immediate;Throat Clearing - Delayed   Thin Liquid Thin Liquid: Impaired Presentation: Cup;Spoon Oral Phase Functional Implications: Prolonged oral transit Pharyngeal  Phase Impairments: Suspected delayed Swallow;Multiple swallows;Wet Vocal Quality;Throat Clearing - Immediate;Throat Clearing - Delayed    Nectar Thick Nectar Thick Liquid: Impaired Presentation: Cup Oral phase functional implications: Prolonged oral transit Pharyngeal Phase Impairments: Suspected delayed Swallow;Multiple swallows;Wet Vocal Quality;Throat Clearing - Delayed;Throat Clearing - Immediate   Honey Thick Honey Thick Liquid: Not tested   Puree Puree: Impaired Presentation: Self Fed Oral Phase Functional Implications: Prolonged oral transit Pharyngeal Phase Impairments: Suspected delayed Swallow;Multiple swallows;Throat Clearing - Immediate;Throat Clearing - Delayed   Solid   GO   Solid: Impaired Oral Phase Functional Implications: Prolonged oral transit;Impaired mastication Pharyngeal Phase Impairments: Suspected delayed Swallow;Multiple swallows;Wet Vocal Quality;Throat Clearing - Immediate;Throat Clearing - Delayed       Marcene Duos MA, CCC-SLP Acute Care Speech Language Pathologist    Kennieth Rad 07/23/2017,3:37 PM

## 2017-07-23 NOTE — Clinical Social Work Note (Signed)
Clinical Social Work Assessment  Patient Details  Name: Javier Murillo MRN: [id number] Date of Birth: [birth year]  Date of referral:  07/23/17               Reason for consult:  Facility Placement [facility])                Permission sought to share information with:  Family Supports Permission granted to share information::     Name::     Furr,Brenda  Agency::  [facility]  Relationship::  Daughter  Contact Information:  [phone]  Housing/Transportation Living arrangements for the past 2 months:    Source of Information:  Patient Patient Interpreter Needed:  None Criminal Activity/Legal Involvement Pertinent to Current Situation/Hospitalization:  No  Significant Relationships:  Warehouse manager, Adult Children Lives with:  Facility Resident Do you feel safe going back to the place where you live?  Yes Need for family participation in patient care:  Yes (Dementia)  Care giving concerns: CSW following to assist with patient discharge back to [facility].    Social Worker assessment / plan: CSW met with patient and daughter at bedside, explain role and reason for visit to assist with pt. Discharge back to [facility].  Patient daughter reports the patient uses a wheel chair at baseline, need plus one to assist with pivoting and needs help with all ADL's. She prefers the patient not go to any other facility.  CSW called facility and spoke with Patty the Coordinator, she confirmed patient level of assistance provided at facility. She reports the facility has Physical Therapy contract with [facility] that can see patient a few times a week. They are agreeable for patient to return.   Plan: Assist with discharge to back to Memory Care Unit  Employment status:  Retired Nurse, adult PT Recommendations:  [place] / Referral to community resources:      Patient/Family's Response to care:  Patient daughter at bedside and agreeable to patient care.   Patient/Family's Understanding of and Emotional Response to Diagnosis, Current Treatment, and Prognosis: "I hope he can continue his care at [facility]." Patient daughter involved in pt. care and understands patient current treatment and follow up treatment once medically stable for discharge.   Emotional Assessment Appearance:  Appears stated age Attitude/Demeanor/Rapport:    Affect (typically observed):  Calm Orientation:  Oriented to Self Alcohol / Substance use:  Not Applicable Psych involvement (Current and /or in the community):  No (Comment)  Discharge Needs  Concerns to be addressed:  Discharge Planning Concerns Readmission within the last 30 days:  No Current discharge risk:  None Barriers to Discharge:  Continued Medical Work up   [employer], LCSW 07/23/2017, 2:35 PM

## 2017-07-23 NOTE — Progress Notes (Signed)
Bedside renal US being done now - Bladder scan revealed 575cc      Urine- condom cath placed  on pt on arrival to 1516

## 2017-07-23 NOTE — Care Management Note (Signed)
Case Management Note  Patient Details  Name: Javier Murillo MRN: [id number] Date of Birth: [birth year]  Subjective/Objective:                  WBC 25, hemoglobin 12.4, platelets 425, sodium 128, BUN 60, creatinine 2.6, and glucose 53. Urinalysis was negative for any signs of infection. Chest x-ray showed background emphysema with possible atelectasis or atelectatic pneumonia. Patient was given 500 mL bolus of normal saline IV fluids along with empiric antibiotics of azithromycin and ceftriaxone.   Action/Plan: Date:  July 23, 2017 Chart reviewed for concurrent status and case management needs. Will continue to follow patient progress. Discharge Planning: following for needs Expected discharge date: [id number] Marcelle Smiling, BSN, [place], [place]   [phone]  Expected Discharge Date:   (unknown)               Expected Discharge Plan:  Home/Self Care  In-House Referral:  Clinical Social Work  Discharge planning Services  CM Consult  Post Acute Care Choice:    Choice offered to:     DME Arranged:    DME Agency:     HH Arranged:    HH Agency:     Status of Service:  In process, will continue to follow  If discussed at Long Length of Stay Meetings, dates discussed:    Additional Comments:  Golda Acre, RN 07/23/2017, 8:48 AM

## 2017-07-24 ENCOUNTER — Inpatient Hospital Stay (HOSPITAL_COMMUNITY): Payer: Medicare Other

## 2017-07-24 DIAGNOSIS — F0390 Unspecified dementia without behavioral disturbance: Secondary | ICD-10-CM

## 2017-07-24 DIAGNOSIS — E43 Unspecified severe protein-calorie malnutrition: Secondary | ICD-10-CM

## 2017-07-24 LAB — CBC
HEMATOCRIT: 36.7 % — AB (ref 39.0–52.0)
HEMOGLOBIN: 12.4 g/dL — AB (ref 13.0–17.0)
MCH: 33.2 pg (ref 26.0–34.0)
MCHC: 33.8 g/dL (ref 30.0–36.0)
MCV: 98.4 fL (ref 78.0–100.0)
Platelets: 346 10*3/uL (ref 150–400)
RBC: 3.73 MIL/uL — AB (ref 4.22–5.81)
RDW: 12.8 % (ref 11.5–15.5)
WBC: 18.6 10*3/uL — ABNORMAL HIGH (ref 4.0–10.5)

## 2017-07-24 LAB — LEGIONELLA PNEUMOPHILA SEROGP 1 UR AG: L. PNEUMOPHILA SEROGP 1 UR AG: NEGATIVE

## 2017-07-24 LAB — GLUCOSE, CAPILLARY
GLUCOSE-CAPILLARY: 161 mg/dL — AB (ref 65–99)
GLUCOSE-CAPILLARY: 165 mg/dL — AB (ref 65–99)
GLUCOSE-CAPILLARY: 215 mg/dL — AB (ref 65–99)
GLUCOSE-CAPILLARY: 253 mg/dL — AB (ref 65–99)
Glucose-Capillary: 149 mg/dL — ABNORMAL HIGH (ref 65–99)
Glucose-Capillary: 191 mg/dL — ABNORMAL HIGH (ref 65–99)
Glucose-Capillary: 75 mg/dL (ref 65–99)

## 2017-07-24 LAB — BASIC METABOLIC PANEL
Anion gap: 11 (ref 5–15)
BUN: 38 mg/dL — AB (ref 6–20)
CHLORIDE: 108 mmol/L (ref 101–111)
CO2: 21 mmol/L — AB (ref 22–32)
CREATININE: 1.94 mg/dL — AB (ref 0.61–1.24)
Calcium: 8.6 mg/dL — ABNORMAL LOW (ref 8.9–10.3)
GFR calc Af Amer: 35 mL/min — ABNORMAL LOW (ref >60)
GFR calc non Af Amer: 30 mL/min — ABNORMAL LOW (ref >60)
Glucose, Bld: 219 mg/dL — ABNORMAL HIGH (ref 65–99)
POTASSIUM: 4.8 mmol/L (ref 3.5–5.1)
Sodium: 140 mmol/L (ref 135–145)

## 2017-07-24 LAB — HEMOGLOBIN A1C
Hgb A1c MFr Bld: 7.6 % — ABNORMAL HIGH (ref 4.8–5.6)
Mean Plasma Glucose: 171.42 mg/dL

## 2017-07-24 MED ORDER — SODIUM CHLORIDE 0.9 % IV SOLN
1.5000 g | Freq: Two times a day (BID) | INTRAVENOUS | Status: DC
  Administered 2017-07-24 (×2): 1.5 g via INTRAVENOUS
  Filled 2017-07-24 (×3): qty 1.5

## 2017-07-24 MED ORDER — SODIUM CHLORIDE 0.9 % IV SOLN: 1.5000 g | Freq: Four times a day (QID) | INTRAVENOUS | Status: DC

## 2017-07-24 MED ORDER — INSULIN DETEMIR 100 UNIT/ML ~~LOC~~ SOLN
10.0000 [IU] | Freq: Two times a day (BID) | SUBCUTANEOUS | Status: DC
  Administered 2017-07-24 – 2017-07-26 (×5): 10 [IU] via SUBCUTANEOUS
  Filled 2017-07-24 (×6): qty 0.1

## 2017-07-24 MED ORDER — INSULIN ASPART 100 UNIT/ML ~~LOC~~ SOLN
0.0000 [IU] | Freq: Three times a day (TID) | SUBCUTANEOUS | Status: DC
  Administered 2017-07-24: 3 [IU] via SUBCUTANEOUS
  Administered 2017-07-24: 8 [IU] via SUBCUTANEOUS
  Administered 2017-07-24: 3 [IU] via SUBCUTANEOUS
  Administered 2017-07-25: 5 [IU] via SUBCUTANEOUS
  Administered 2017-07-25: 2 [IU] via SUBCUTANEOUS
  Administered 2017-07-26: 5 [IU] via SUBCUTANEOUS

## 2017-07-24 MED ORDER — INSULIN ASPART 100 UNIT/ML ~~LOC~~ SOLN
0.0000 [IU] | Freq: Every day | SUBCUTANEOUS | Status: DC
Start: 2017-07-24 — End: 2017-07-26

## 2017-07-24 MED ORDER — INSULIN ASPART 100 UNIT/ML ~~LOC~~ SOLN
3.0000 [IU] | Freq: Three times a day (TID) | SUBCUTANEOUS | Status: DC
  Administered 2017-07-24 – 2017-07-26 (×6): 3 [IU] via SUBCUTANEOUS

## 2017-07-24 NOTE — Progress Notes (Signed)
Modified Barium Swallow Progress Note  Patient Details  Name: Javier Murillo MRN: [id number] Date of Birth: [birth year]  Today's Date: 07/24/2017  Modified Barium Swallow completed.  Full report located under Chart Review in the Imaging Section.  Brief recommendations include the following:  Clinical Impression: Pt exhibits moderate oropharyngeal dysphagia; mild esophageal phase dysphagia; oral phase characterized by oral holding and disorganized swallow with various consistencies; impaired mastication with solids and delay in the initiation of the swallow with all consistencies to valleculae/pyriform sinuses with thin/puree/solids (pyriform sinuses only with larger amounts of thin via cup/straw) and eventual deep penetration and/or trace aspiration with thin via cup with larger amounts; smaller amounts of thin yielded flash penetration with one swallow, but cleared laryngeal vestibule; nectar-thickened liquids eliminated this occurrence via cup sips; no aspiration/penetration with puree/solids; esophageal backflow into the distal esophagus noted at end of the study after all consistencies; consequently, this may be a concern during meal intake without swallowing precautions utilized for repetitive swallows, small sips/bites, slow rate and full supervision with meals; recommend Dysphagia 3 (mechanical/soft)/thin liquids via small sips only and/or tsp amounts with full supervision with compensatory strategies required; if pt continues to have difficulty during meals despite swallowing precautions, nectar-thickened liquids may be considered d/t moderate risk of aspiration paired with decreased cognitive status        Swallow Evaluation Recommendations       SLP Diet Recommendations: Dysphagia 3 (Mech soft) solids;Thin liquid;Other (Comment) (no straws; small sips only)   Liquid Administration via: Spoon;Cup;No straw   Medication Administration: Whole meds with puree   Supervision: Patient  able to self feed;Staff to assist with self feeding;Full supervision/cueing for compensatory strategies   Compensations: Minimize environmental distractions;Slow rate;Small sips/bites;Clear throat intermittently   Postural Changes: Remain semi-upright after after feeds/meals (Comment);Seated upright at 90 degrees   Oral Care Recommendations: Oral care BID        Tressie Stalker, M.S., CCC-SLP 07/24/2017,9:44 AM

## 2017-07-24 NOTE — Progress Notes (Signed)
PHARMACY NOTE:  ANTIMICROBIAL RENAL DOSAGE ADJUSTMENT  Current antimicrobial regimen includes a mismatch between antimicrobial dosage and estimated renal function.  As per policy approved by the Pharmacy & Therapeutics and Medical Executive Committees, the antimicrobial dosage will be adjusted accordingly.  Current antimicrobial dosage:  Unasyn 1.5gm IV q6h  Indication: Aspiration Pneumonia  Renal Function:  Estimated Creatinine Clearance: 28.7 mL/min (A) (by C-G formula based on SCr of 1.94 mg/dL (H)). []      On intermittent HD, scheduled: []      On CRRT    Antimicrobial dosage has been changed to:  Unasyn 1.5gm IV q12h (due to CrCl < 30 ml/min)  Additional comments:   Thank you for allowing pharmacy to be a part of this patient's care.  Maryellen Pile, [facility] 07/24/2017 9:15 AM

## 2017-07-24 NOTE — Progress Notes (Addendum)
[facility] PROGRESS NOTE    Progress Note  Javier Murillo  ZOX:[id number] DOB: [birth year] DOA: 07/22/2017 PCP: Juluis Rainier, MD     Brief Narrative:   Javier Murillo is an 83 y.o. male past medical history significant for essential hypertension, advanced dementia diabetes mellitus type 2 presents with complaints of weakness and tiredness. She presents with increased weakness and lethargy over the last few days, accompanied by an intermittent cough chest x-ray showed emphysema with possible pneumonia elevated white count  Assessment/Plan:   Sepsis (HCC) Possibly due to bilateral lower lobes Aspiration pneumonia: Culture data Are negative, Continues to remain afebrile, swallowing evaluation showed moderate risk for aspiration. Discontinue IV Rocephin and azithromycin changed to IV Unasyn.  Hypoglycemia/diabetes mellitus type 2 with peripheral neuropathy: Tolerating orals change CBG before meals and at bedtime and continue sliding scale insulin.  Acute kidney injury on chronic renal disease stage III: Baseline creatinine 1.1-1.5. Renal ultrasound showed mild left hydronephrosis and no stones. His creatinine is improving with IV fluid hydration likely prerenal in etiology.   Hyponatremia: Likely due to dehydration, resolved with IV fluids.  Toxic encephalopathy: Likely related to infectious etiology.  Essential hypertension: Continue amlodipine.  Normocytic anemia: Hemoglobin is stable.  Advanced dementia: Stable continue Haldol when necessary for agitation.  Severe protein caloric malnutrition: Consult nutritionist.   DVT prophylaxis: lovenxo Family Communication:none Disposition Plan/Barrier to D/C: home in 2 days Code Status:     Code Status Orders        Start     Ordered   07/22/17 2130  Full code  Continuous     07/22/17 2134    Code Status History    Date Active Date Inactive Code Status Order ID Comments User Context   10/24/2016   8:53 PM 10/27/2016  5:14 PM Full Code [id number]  Cathren Harsh, MD Inpatient   09/15/2015  7:08 PM 09/17/2015  2:32 PM Full Code [id number]  Dorothea Ogle, MD Inpatient   08/28/2014  2:27 PM 08/28/2014  2:27 PM Full Code [id number]  Charlton Amor, PA-C Inpatient   08/28/2014  2:27 PM 09/11/2014  5:13 PM Full Code [id number]  Charlton Amor, PA-C Inpatient   08/22/2014 11:34 PM 08/28/2014  2:27 PM Full Code [id number]  Hewitt Shorts, MD Inpatient   07/23/2014 11:58 PM 07/25/2014  3:06 PM Full Code [id number]  Alison Murray, MD Inpatient    Advance Directive Documentation     Most Recent Value  Type of Advance Directive  Healthcare Power of Attorney  Pre-existing out of facility DNR order (yellow form or pink MOST form)  -  "MOST" Form in Place?  -        IV Access:    Peripheral IV   Procedures and diagnostic studies:   X-ray Chest Pa And Lateral  Result Date: 07/23/2017 CLINICAL DATA:  Sepsis EXAM: CHEST  2 VIEW COMPARISON:  07/22/17 FINDINGS: Cardiac shadow is stable. Bibasilar atelectatic changes are noted slightly increased from the prior exam. No sizable effusion is seen. No acute bony abnormality is noted. IMPRESSION: Slight increase in bibasilar atelectasis. Electronically Signed   By: Alcide Clever M.D.   On: 07/23/2017 08:09   Dg Chest 2 View  Result Date: 07/22/2017 CLINICAL DATA:  Hypoglycemia. Elevated white count. Lethargic. Smoking history. EXAM: CHEST  2 VIEW COMPARISON:  02/21/2016 FINDINGS: Heart size is normal. There is chronic aortic atherosclerosis. Lucency of the lungs in the upper chest suggests emphysema. There is new linear atelectasis  in the right lower lung. There is mild patchy density in the left lower lobe that could represent atelectasis or mild left lower lobe pneumonia. No effusions. No acute bone finding. Chronic lower thoracic compression fracture. IMPRESSION: Background emphysema. Aortic atherosclerosis. Atelectasis in both lower lungs which could be  simple atelectasis or atelectatic pneumonia. Electronically Signed   By: Paulina Fusi M.D.   On: 07/22/2017 14:46   US Renal  Result Date: 07/23/2017 CLINICAL DATA:  Acute renal failure EXAM: RENAL / URINARY TRACT ULTRASOUND COMPLETE COMPARISON:  CT 11/10/2010 FINDINGS: Right Kidney: Length: 7.9 cm. Negative for hydronephrosis. Tiny hypoechoic nodule upper pole right kidney measuring 8 mm. Left Kidney: Length: 10.1 cm. Echogenicity within normal limits. Mild left hydronephrosis. Bladder: Appears normal for degree of bladder distention. IMPRESSION: 1. Asymmetric renal size, right smaller than left, raises possibility for mild right renal atrophy. 2. Mild left hydronephrosis 3. 8 mm hypoechoic nodule upper pole right kidney, possible small cyst with scattered echoes but further evaluation difficult due to small size. Electronically Signed   By: Jasmine Pang M.D.   On: 07/23/2017 01:26     Medical Consultants:    None.  Anti-Infectives:   Rocephin and azithromycin.  Subjective:    Javier Murillo comfortably last radius and no questions. No insight.  Objective:    Vitals:   07/23/17 1324 07/23/17 1614 07/23/17 2056 07/24/17 0500  BP:  (!) 141/73 130/62 (!) 149/71  Pulse:  60 97 72  Resp:  18 18 16   Temp:  98.2 F (36.8 C) 99.6 F (37.6 C) 99 F (37.2 C)  TempSrc:  Axillary Axillary Oral  SpO2:  97% 93% 97%  Weight: 75.8 kg (167 lb)       Intake/Output Summary (Last 24 hours) at 07/24/17 0730 Last data filed at 07/24/17 0400  Gross per 24 hour  Intake             3345 ml  Output             1800 ml  Net             1545 ml   Filed Weights   07/23/17 1324  Weight: 75.8 kg (167 lb)    Exam: General exam: No acute distress. Respiratory system: Good air movement clear to auscultation. Cardiovascular system: Regular rate and rhythm with positive S1-S2. Gastrointestinal system: Positive bowel sounds soft nontender nondistended. Central nervous system:  Sleepy. Extremities: No lower extremity edema Skin: No rashes, lesions or ulcers Psychiatry: No insight on medical condition.  Data Reviewed:    Labs: Basic Metabolic Panel:  Recent Labs Lab 07/22/17 1444 07/23/17 0709  NA 148* 144  K 3.9 4.1  CL 112* 113*  CO2 26 23  GLUCOSE 53* 205*  BUN 60* 50*  CREATININE 2.60* 2.13*  CALCIUM 9.5 8.7*   GFR Estimated Creatinine Clearance: 27 mL/min (A) (by C-G formula based on SCr of 2.13 mg/dL (H)). Liver Function Tests:  Recent Labs Lab 07/22/17 1444  AST 42*  ALT 37  ALKPHOS 60  BILITOT 0.7  PROT 7.1  ALBUMIN 3.4*   No results for input(s): LIPASE, AMYLASE in the last 168 hours. No results for input(s): AMMONIA in the last 168 hours. Coagulation profile No results for input(s): INR, PROTIME in the last 168 hours.  CBC:  Recent Labs Lab 07/22/17 1500 07/23/17 0709  WBC 25.0* 20.2*  NEUTROABS 21.2*  --   HGB 12.4* 11.8*  HCT 38.0* 36.7*  MCV 100.0 99.2  PLT 425* 351   Cardiac Enzymes: No results for input(s): CKTOTAL, CKMB, CKMBINDEX, TROPONINI in the last 168 hours. BNP (last 3 results) No results for input(s): PROBNP in the last 8760 hours. CBG:  Recent Labs Lab 07/23/17 1130 07/23/17 1633 07/23/17 1934 07/24/17 0022 07/24/17 0531  GLUCAP 219* 346* 464* 215* 149*   D-Dimer: No results for input(s): DDIMER in the last 72 hours. Hgb A1c: No results for input(s): HGBA1C in the last 72 hours. Lipid Profile: No results for input(s): CHOL, HDL, LDLCALC, TRIG, CHOLHDL, LDLDIRECT in the last 72 hours. Thyroid function studies:  Recent Labs  07/22/17 2231  TSH 1.376   Anemia work up: No results for input(s): VITAMINB12, FOLATE, FERRITIN, TIBC, IRON, RETICCTPCT in the last 72 hours. Sepsis Labs:  Recent Labs Lab 07/22/17 1500 07/22/17 2047 07/22/17 2231 07/23/17 0709  PROCALCITON  --   --  0.45  --   WBC 25.0*  --   --  20.2*  LATICACIDVEN  --  1.1  --   --    Microbiology Recent Results  (from the past 240 hour(s))  MRSA PCR Screening     Status: None   Collection Time: 07/22/17 11:35 PM  Result Value Ref Range Status   MRSA by PCR NEGATIVE NEGATIVE Final    Comment:        The GeneXpert MRSA Assay (FDA approved for NASAL specimens only), is one component of a comprehensive MRSA colonization surveillance program. It is not intended to diagnose MRSA infection nor to guide or monitor treatment for MRSA infections.      Medications:   . amLODipine  5 mg Oral Daily  . feeding supplement (GLUCERNA SHAKE)  237 mL Oral TID BM  . ferrous sulfate  325 mg Oral Q breakfast  . guaiFENesin  600 mg Oral BID  . heparin  5,000 Units Subcutaneous Q8H  . insulin aspart  0-8 Units Subcutaneous Q4H   Continuous Infusions: . sodium chloride 1,000 mL (07/22/17 2321)  . azithromycin Stopped (07/23/17 1908)  . cefTRIAXone (ROCEPHIN)  IV 1 g (07/23/17 2249)      LOS: 2 days   Marinda Elk  [facility] Pager [phone]  *Please refer to [email or web address], password [email or web address] to get updated schedule on who will round on this patient, as hospitalists switch teams weekly. If 7PM-7AM, please contact night-coverage at [email or web address], password [email or web address] for any overnight needs.  07/24/2017, 7:30 AM

## 2017-07-25 DIAGNOSIS — J690 Pneumonitis due to inhalation of food and vomit: Secondary | ICD-10-CM

## 2017-07-25 LAB — GLUCOSE, CAPILLARY
GLUCOSE-CAPILLARY: 76 mg/dL (ref 65–99)
GLUCOSE-CAPILLARY: 146 mg/dL — AB (ref 65–99)
GLUCOSE-CAPILLARY: 180 mg/dL — AB (ref 65–99)
Glucose-Capillary: 67 mg/dL (ref 65–99)
Glucose-Capillary: 96 mg/dL (ref 65–99)
Glucose-Capillary: 226 mg/dL — ABNORMAL HIGH (ref 65–99)

## 2017-07-25 LAB — BASIC METABOLIC PANEL
ANION GAP: 7 (ref 5–15)
BUN: 33 mg/dL — ABNORMAL HIGH (ref 6–20)
CALCIUM: 8.6 mg/dL — AB (ref 8.9–10.3)
CO2: 25 mmol/L (ref 22–32)
Chloride: 112 mmol/L — ABNORMAL HIGH (ref 101–111)
Creatinine, Ser: 1.8 mg/dL — ABNORMAL HIGH (ref 0.61–1.24)
GFR calc non Af Amer: 33 mL/min — ABNORMAL LOW (ref >60)
GFR, EST AFRICAN AMERICAN: 38 mL/min — AB (ref >60)
Glucose, Bld: 91 mg/dL (ref 65–99)
Potassium: 3.8 mmol/L (ref 3.5–5.1)
Sodium: 144 mmol/L (ref 135–145)

## 2017-07-25 MED ORDER — AMOXICILLIN-POT CLAVULANATE 875-125 MG PO TABS
1.0000 | ORAL_TABLET | Freq: Two times a day (BID) | ORAL | Status: DC
  Administered 2017-07-25 – 2017-07-26 (×3): 1 via ORAL
  Filled 2017-07-25 (×3): qty 1

## 2017-07-25 MED ORDER — SODIUM CHLORIDE 0.9 % IV SOLN: 1000.0000 mL | INTRAVENOUS | Status: DC

## 2017-07-25 MED ORDER — VITAMIN D3 25 MCG (1000 UNIT) PO TABS
1000.0000 [IU] | ORAL_TABLET | Freq: Every evening | ORAL | Status: DC
  Administered 2017-07-25: 1000 [IU] via ORAL
  Filled 2017-07-25: qty 1

## 2017-07-25 MED ORDER — FUROSEMIDE 20 MG PO TABS
20.0000 mg | ORAL_TABLET | Freq: Every day | ORAL | Status: DC
  Administered 2017-07-25 – 2017-07-26 (×2): 20 mg via ORAL
  Filled 2017-07-25 (×2): qty 1

## 2017-07-25 NOTE — Progress Notes (Signed)
[facility] PROGRESS NOTE    Progress Note  Javier Murillo  NAT:[id number] DOB: [birth year] DOA: 07/22/2017 PCP: Juluis Rainier, MD     Brief Narrative:   Javier Murillo is an 83 y.o. male past medical history significant for essential hypertension, advanced dementia diabetes mellitus type 2 presents with complaints of weakness and tiredness. She presents with increased weakness and lethargy over the last few days, accompanied by an intermittent cough chest x-ray showed emphysema with possible pneumonia elevated white count  Assessment/Plan:   Sepsis (HCC) Possibly due to bilateral lower lobes Aspiration pneumonia: Culture data are negative. Continues to remain afebrile, swallowing evaluation showed moderate risk for aspiration. Change antibiotics to oral Augmentin.Tolerating his diet without any difficulties.  Hypoglycemia/diabetes mellitus type 2 with peripheral neuropathy: No further hypoglycemia, and advanced dementia patient goal hemoglobin A1c at is less than 8.  Acute kidney injury on chronic renal disease stage III: Baseline creatinine 1.1-1.5. Creatinine at baseline, KVO IV fluids start oral Lasix.  Hyponatremia: Resolved.  Toxic encephalopathy: Likely related to infectious etiology. Resolved.  Essential hypertension: Resume home regimen. Except for ARB which can be resumed as an outpatient.  Normocytic anemia: Hemoglobin is stable.  Advanced dementia: Stable continue Haldol when necessary for agitation.  Severe protein caloric malnutrition: Consult nutritionist. Malnutrition Type:  Nutrition Problem: Inadequate oral intake Etiology: poor appetite   Malnutrition Characteristics:  Signs/Symptoms: per patient/family report   Nutrition Interventions:  Interventions: Glucerna shake      DVT prophylaxis: lovenxo Family Communication:none Disposition Plan/Barrier to D/C: home in 1 days Code Status:     Code Status Orders        Start     Ordered   07/22/17 2130  Full code  Continuous     07/22/17 2134    Code Status History    Date Active Date Inactive Code Status Order ID Comments User Context   10/24/2016  8:53 PM 10/27/2016  5:14 PM Full Code [id number]  Cathren Harsh, MD Inpatient   09/15/2015  7:08 PM 09/17/2015  2:32 PM Full Code [id number]  Dorothea Ogle, MD Inpatient   08/28/2014  2:27 PM 08/28/2014  2:27 PM Full Code [id number]  Charlton Amor, PA-C Inpatient   08/28/2014  2:27 PM 09/11/2014  5:13 PM Full Code [id number]  Charlton Amor, PA-C Inpatient   08/22/2014 11:34 PM 08/28/2014  2:27 PM Full Code [id number]  Hewitt Shorts, MD Inpatient   07/23/2014 11:58 PM 07/25/2014  3:06 PM Full Code [id number]  Alison Murray, MD Inpatient    Advance Directive Documentation     Most Recent Value  Type of Advance Directive  Healthcare Power of Attorney  Pre-existing out of facility DNR order (yellow form or pink MOST form)  -  "MOST" Form in Place?  -        IV Access:    Peripheral IV   Procedures and diagnostic studies:   Dg Swallowing Func-speech Pathology  Result Date: 07/24/2017 Objective Swallowing Evaluation: Type of Study: MBS-Modified Barium Swallow Study Patient Details Name: Javier Murillo MRN: [id number] Date of Birth: 1930-04-16 Today's Date: 07/24/2017 Time: SLP Start Time (ACUTE ONLY): 0848-SLP Stop Time (ACUTE ONLY): 0905 SLP Time Calculation (min) (ACUTE ONLY): 17 min Past Medical History: Past Medical History: Diagnosis Date . Arthritis  . Chronic low back pain  . Degenerative arthritis  . Depression  . Diabetes mellitus without complication (HCC)  . Diabetic peripheral neuropathy (HCC)  . Gait disorder  . Hyperlipidemia  .  Hypertension  . Kidney stones   only once . Lumbosacral spondylosis  . Peripheral vascular disease (HCC)  . Quadriplegia and quadriparesis (HCC) 08/20/2014 Past Surgical History: Past Surgical History: Procedure Laterality Date . ANTERIOR CERVICAL DECOMP/DISCECTOMY  FUSION N/A 08/22/2014  Procedure: ANTERIOR CERVICAL DECOMPRESSION/DISCECTOMY FUSION, cervical three-four;  Surgeon: Hewitt Shorts, MD;  Location: [facility];  Service: Neurosurgery;  Laterality: N/A;  C3-4 anterior cervical decompression with fusion plating and bonegraft . APPENDECTOMY   . BACK SURGERY    x 7 . bone spur removed  right sholder   . bursitis Bilateral   olecranon I&D . CATARACT EXTRACTION W/ INTRAOCULAR LENS  IMPLANT, BILATERAL   . EYE SURGERY    bilateral cataracts . LUMBAR SPINE SURGERY    x7 . SHOULDER SURGERY Bilateral  HPI: Javier Murillo a 83 y.o.malewith medical history significant ofHTN, HLD, dementia, DM type II;who presented with complaints of weakness and tiredness. Chest x ray with atelectasis vs PNA; BSE on 07/23/17 indicated frequent throat clearing during meal and family reports this happens during meals often; Dysphagia 3 (mechanical/soft)/thin liquids recommended with BSE completion. No Data Recorded Assessment / Plan / Recommendation CHL IP CLINICAL IMPRESSIONS 07/24/2017 Clinical Impression Pt exhibits moderate oropharyngeal dysphagia; mild esophageal phase dysphagia; oral phase characterized by oral holding and disorganized swallow with various consistencies; impaired mastication with solids and delay in the initiation of the swallow with all consistencies to valleculae/pyriform sinuses with thin/puree/solids (pyriform sinuses only with larger amounts of thin via cup/straw) and eventual deep penetration and/or trace aspiration with thin via cup with larger amounts; smaller amounts of thin yielded flash penetration with one swallow, but cleared laryngeal vestibule; nectar-thickened liquids eliminated this occurrence via cup sips; no aspiration/penetration with puree/solids; esophageal backflow into the distal esophagus noted at end of the study after all consistencies; consequently, this may be a concern during meal intake without swallowing precautions utilized for  repetitive swallows, small sips/bites, slow rate and full supervision with meals; recommend Dysphagia 3 (mechanical/soft)/thin liquids via small sips only and/or tsp amounts with full supervision with compensatory strategies required; if pt continues to have difficulty during meals despite swallowing precautions, nectar-thickened liquids may be considered d/t moderate risk of aspiration paired with decreased cognitive status SLP Visit Diagnosis Dysphagia, oropharyngeal phase (R13.12);Dysphagia, pharyngoesophageal phase (R13.14) Attention and concentration deficit following -- Frontal lobe and executive function deficit following -- Impact on safety and function Moderate aspiration risk   CHL IP TREATMENT RECOMMENDATION 07/24/2017 Treatment Recommendations Therapy as outlined in treatment plan below   Prognosis 07/24/2017 Prognosis for Safe Diet Advancement Good Barriers to Reach Goals Cognitive deficits Barriers/Prognosis Comment -- CHL IP DIET RECOMMENDATION 07/24/2017 SLP Diet Recommendations Dysphagia 3 (Mech soft) solids;Thin liquid;Other (small sips only) Liquid Administration via Spoon;Cup;No straw Medication Administration Whole meds with puree Compensations Minimize environmental distractions;Slow rate;Small sips/bites;Clear throat intermittently Postural Changes Remain semi-upright after after feeds/meals (Comment);Seated upright at 90 degrees   CHL IP OTHER RECOMMENDATIONS 07/24/2017 Recommended Consults -- Oral Care Recommendations Oral care BID Other Recommendations --   CHL IP FOLLOW UP RECOMMENDATIONS 07/24/2017 Follow up Recommendations Other (TBD)   CHL IP FREQUENCY AND DURATION 07/24/2017 Speech Therapy Frequency (ACUTE ONLY) min 2x/week Treatment Duration 1 week      CHL IP ORAL PHASE 07/24/2017 Oral Phase Impaired Oral - Pudding Teaspoon -- Oral - Pudding Cup -- Oral - Honey Teaspoon -- Oral - Honey Cup -- Oral - Nectar Teaspoon -- Oral - Nectar Cup -- Oral - Nectar Straw -- Oral -  Thin Teaspoon Holding  of bolus Oral - Thin Cup Other (disorganized swallow) Oral - Thin Straw -- Oral - Puree Holding of bolus;Other (disorganized swallow) Oral - Mech Soft Decreased bolus cohesion Oral - Regular -- Oral - Multi-Consistency -- Oral - Pill -- Oral Phase - Comment --  CHL IP PHARYNGEAL PHASE 07/24/2017 Pharyngeal Phase Impaired Pharyngeal- Pudding Teaspoon -- Pharyngeal -- Pharyngeal- Pudding Cup -- Pharyngeal -- Pharyngeal- Honey Teaspoon -- Pharyngeal -- Pharyngeal- Honey Cup -- Pharyngeal -- Pharyngeal- Nectar Teaspoon -- Pharyngeal -- Pharyngeal- Nectar Cup Delayed swallow initiation-vallecula Pharyngeal -- Pharyngeal- Nectar Straw -- Pharyngeal -- Pharyngeal- Thin Teaspoon Delayed swallow initiation-vallecula Pharyngeal -- Pharyngeal- Thin Cup Delayed swallow initiation-pyriform sinuses;Penetration/Aspiration during swallow Pharyngeal -- Pharyngeal- Thin Straw Delayed swallow initiation-pyriform sinuses;Reduced airway/laryngeal closure;Penetration/Aspiration during swallow;Trace aspiration Pharyngeal -- Pharyngeal- Puree Delayed swallow initiation-vallecula Pharyngeal -- Pharyngeal- Mechanical Soft Delayed swallow initiation-vallecula Pharyngeal -- Pharyngeal- Regular -- Pharyngeal -- Pharyngeal- Multi-consistency -- Pharyngeal -- Pharyngeal- Pill -- Pharyngeal -- Pharyngeal Comment --  CHL IP CERVICAL ESOPHAGEAL PHASE 07/24/2017 Cervical Esophageal Phase Impaired Pudding Teaspoon -- Pudding Cup -- Honey Teaspoon -- Honey Cup -- Nectar Teaspoon -- Nectar Cup -- Nectar Straw -- Thin Teaspoon -- Thin Cup -- Thin Straw Esophageal backflow into cervical esophagus Puree -- Mechanical Soft Esophageal backflow into cervical esophagus Regular -- Multi-consistency -- Pill -- Cervical Esophageal Comment (No Data) No flowsheet data found. Tressie Stalker, M.S., CCC-SLP 07/24/2017, 9:29 AM                Medical Consultants:    None.  Anti-Infectives:   Rocephin and azithromycin.  Subjective:    Jule Ser is  comfortablyIn bed  Objective:    Vitals:   07/24/17 2057 07/25/17 0429 07/25/17 0500 07/25/17 0805  BP: 114/68 122/65  (!) 141/70  Pulse: 71 78    Resp: 16 16    Temp: 98.8 F (37.1 C) 99 F (37.2 C)    TempSrc: Oral Oral    SpO2: 97% 100%    Weight:   81.2 kg (179 lb 0.2 oz)   Height:        Intake/Output Summary (Last 24 hours) at 07/25/17 0812 Last data filed at 07/25/17 0555  Gross per 24 hour  Intake          3801.67 ml  Output             1300 ml  Net          2501.67 ml   Filed Weights   07/23/17 1324 07/24/17 1332 07/25/17 0500  Weight: 75.8 kg (167 lb) 79.7 kg (175 lb 11.3 oz) 81.2 kg (179 lb 0.2 oz)    Exam: General exam: No acute distress. Respiratory system: Good air movement clear to auscultation. Cardiovascular system: Regular rate and rhythm with positive S1-S2. Gastrointestinal system: Positive bowel sounds soft nontender nondistended. Central nervous system: Sleepy. Extremities: No lower extremity edema Skin: No rashes, lesions or ulcers Psychiatry: No insight on medical condition.  Data Reviewed:    Labs: Basic Metabolic Panel:  Recent Labs Lab 07/22/17 1444 07/23/17 0709 07/24/17 0822 07/25/17 0616  NA 148* 144 140 144  K 3.9 4.1 4.8 3.8  CL 112* 113* 108 112*  CO2 26 23 21* 25  GLUCOSE 53* 205* 219* 91  BUN 60* 50* 38* 33*  CREATININE 2.60* 2.13* 1.94* 1.80*  CALCIUM 9.5 8.7* 8.6* 8.6*   GFR Estimated Creatinine Clearance: 31 mL/min (A) (by C-G formula based on SCr of 1.8 mg/dL (H)). Liver  Function Tests:  Recent Labs Lab 07/22/17 1444  AST 42*  ALT 37  ALKPHOS 60  BILITOT 0.7  PROT 7.1  ALBUMIN 3.4*   No results for input(s): LIPASE, AMYLASE in the last 168 hours. No results for input(s): AMMONIA in the last 168 hours. Coagulation profile No results for input(s): INR, PROTIME in the last 168 hours.  CBC:  Recent Labs Lab 07/22/17 1500 07/23/17 0709 07/24/17 0822  WBC 25.0* 20.2* 18.6*  NEUTROABS 21.2*  --    --   HGB 12.4* 11.8* 12.4*  HCT 38.0* 36.7* 36.7*  MCV 100.0 99.2 98.4  PLT 425* 351 346   Cardiac Enzymes: No results for input(s): CKTOTAL, CKMB, CKMBINDEX, TROPONINI in the last 168 hours. BNP (last 3 results) No results for input(s): PROBNP in the last 8760 hours. CBG:  Recent Labs Lab 07/24/17 1922 07/24/17 2342 07/25/17 0427 07/25/17 0612 07/25/17 0727  GLUCAP 161* 75 67 96 76   D-Dimer: No results for input(s): DDIMER in the last 72 hours. Hgb A1c:  Recent Labs  07/24/17 0822  HGBA1C 7.6*   Lipid Profile: No results for input(s): CHOL, HDL, LDLCALC, TRIG, CHOLHDL, LDLDIRECT in the last 72 hours. Thyroid function studies:  Recent Labs  07/22/17 2231  TSH 1.376   Anemia work up: No results for input(s): VITAMINB12, FOLATE, FERRITIN, TIBC, IRON, RETICCTPCT in the last 72 hours. Sepsis Labs:  Recent Labs Lab 07/22/17 1500 07/22/17 2047 07/22/17 2231 07/23/17 0709 07/24/17 0822  PROCALCITON  --   --  0.45  --   --   WBC 25.0*  --   --  20.2* 18.6*  LATICACIDVEN  --  1.1  --   --   --    Microbiology Recent Results (from the past 240 hour(s))  Culture, blood (routine x 2) Call MD if unable to obtain prior to antibiotics being given     Status: None (Preliminary result)   Collection Time: 07/22/17 10:40 PM  Result Value Ref Range Status   Specimen Description BLOOD RIGHT ANTECUBITAL  Final   Special Requests IN PEDIATRIC BOTTLE Blood Culture adequate volume  Final   Culture   Final    NO GROWTH 1 DAY Performed at [facility], [address] [place], [place] [address]    Report Status PENDING  Incomplete  Culture, blood (routine x 2) Call MD if unable to obtain prior to antibiotics being given     Status: None (Preliminary result)   Collection Time: 07/22/17 10:50 PM  Result Value Ref Range Status   Specimen Description BLOOD LEFT ANTECUBITAL  Final   Special Requests   Final    BOTTLES DRAWN AEROBIC AND ANAEROBIC Blood Culture adequate  volume   Culture   Final    NO GROWTH 1 DAY Performed at [facility], [address] [place], [place] [address]    Report Status PENDING  Incomplete  MRSA PCR Screening     Status: None   Collection Time: 07/22/17 11:35 PM  Result Value Ref Range Status   MRSA by PCR NEGATIVE NEGATIVE Final    Comment:        The GeneXpert MRSA Assay (FDA approved for NASAL specimens only), is one component of a comprehensive MRSA colonization surveillance program. It is not intended to diagnose MRSA infection nor to guide or monitor treatment for MRSA infections.      Medications:   . amLODipine  5 mg Oral Daily  . feeding supplement (GLUCERNA SHAKE)  237 mL Oral TID BM  . ferrous sulfate  325 mg Oral Q breakfast  . furosemide  20 mg Oral Daily  . guaiFENesin  600 mg Oral BID  . heparin  5,000 Units Subcutaneous Q8H  . insulin aspart  0-15 Units Subcutaneous TID WC  . insulin aspart  0-5 Units Subcutaneous QHS  . insulin aspart  3 Units Subcutaneous TID WC  . insulin detemir  10 Units Subcutaneous BID   Continuous Infusions: . sodium chloride    . ampicillin-sulbactam (UNASYN) IV Stopped (07/25/17 0006)      LOS: 3 days   Marinda Elk  [facility] Pager [phone]  *Please refer to [email or web address], password [email or web address] to get updated schedule on who will round on this patient, as hospitalists switch teams weekly. If 7PM-7AM, please contact night-coverage at [email or web address], password [email or web address] for any overnight needs.  07/25/2017, 8:12 AM

## 2017-07-25 NOTE — Progress Notes (Signed)
Nutrition Follow-up  INTERVENTION:   -Continue Glucerna Shake po TID, each supplement provides 220 kcal and 10 grams of protein -Will continue to monitor for needs  NUTRITION DIAGNOSIS:   Inadequate oral intake related to poor appetite as evidenced by per patient/family report.  Improving.  GOAL:   Patient will meet greater than or equal to 90% of their needs  Progressing.  MONITOR:   PO intake, Supplement acceptance, Labs, Weight trends  REASON FOR ASSESSMENT:   Consult Assessment of nutrition requirement/status  ASSESSMENT:   Pt with PMH of HTN, dementia, CKD III, and DM. Prior to admission facility people at SNF had a hard time transferring pt. He was sent to walk in clinic where his blood sugar was noted to be 53. Presents this admission with bilateral lower lobes pneumonia.   Patient was initially assessed on 8/17, ordered Glucerna shakes TID at that time as pt was eating less than normal d/t poor appetite.  PO intakes have been as follows: 8/17: 100% of lunch ~580 kcal, 31g protein 8/18: 25% of breakfast ~175 kcal, 6g protein          100% of lunch ~500 kcal, 19g protein           50% of dinner ~250 kcal, 14g protein           + 3 Glucerna shakes = 660 kcal, 30g protein 8/19: 75% of breakfast ~550 kcal, 25g protein          85% of lunch ~775 kcal, 40g protein          +1 Glucerna shake  So far today 220 kcal, 10g protein Intakes have improved since admission.  MBS on 8/18: mild-moderate dysphagia. SLP recommends dysphagia 3 diet with thin liquids (was added in addition to CHO modified diet).   Medications: Vitamin D tablet daily, Ferrous sulfate daily, Lasix tablet daily  Labs reviewed: GFR: 33  Diet Order:  Diet Carb Modified Fluid consistency: Thin; Room service appropriate? Yes  Skin:  Reviewed, no issues  Last BM:  8/19  Height:   Ht Readings from Last 1 Encounters:  07/24/17 5\' 10"  (1.778 m)    Weight:   Wt Readings from Last 1 Encounters:   07/25/17 179 lb 0.2 oz (81.2 kg)    Ideal Body Weight:  78.2 kg  BMI:  Body mass index is 25.69 kg/m.  Estimated Nutritional Needs:   Kcal:  1800-2000  Protein:  90-100g  Fluid:  >1.9 L/day  EDUCATION NEEDS:   No education needs identified at this time  Tilda Franco, MS, RD, LDN Pager: [phone] After Hours Pager: [phone]

## 2017-07-26 LAB — BASIC METABOLIC PANEL
ANION GAP: 7 (ref 5–15)
BUN: 30 mg/dL — AB (ref 6–20)
CALCIUM: 8.5 mg/dL — AB (ref 8.9–10.3)
CO2: 24 mmol/L (ref 22–32)
Chloride: 107 mmol/L (ref 101–111)
Creatinine, Ser: 1.86 mg/dL — ABNORMAL HIGH (ref 0.61–1.24)
GFR calc Af Amer: 36 mL/min — ABNORMAL LOW (ref >60)
GFR, EST NON AFRICAN AMERICAN: 31 mL/min — AB (ref >60)
GLUCOSE: 132 mg/dL — AB (ref 65–99)
POTASSIUM: 3.7 mmol/L (ref 3.5–5.1)
SODIUM: 138 mmol/L (ref 135–145)

## 2017-07-26 LAB — GLUCOSE, CAPILLARY
GLUCOSE-CAPILLARY: 127 mg/dL — AB (ref 65–99)
Glucose-Capillary: 228 mg/dL — ABNORMAL HIGH (ref 65–99)

## 2017-07-26 MED ORDER — AMOXICILLIN-POT CLAVULANATE 875-125 MG PO TABS: 1.0000 | ORAL_TABLET | Freq: Two times a day (BID) | ORAL | 0 refills | Status: AC

## 2017-07-26 NOTE — NC FL2 (Addendum)
[place] MEDICAID FL2 LEVEL OF CARE SCREENING TOOL     IDENTIFICATION  Patient Name: Javier Murillo Birthdate: 1930/11/05 Sex: male Admission Date (Current Location): 07/22/2017  [facility] and [place] Number:  Producer, television/film/video and Address:  [facility],  [address] [place], [place] [address]      Provider Number: [id number]  Attending Physician Name and Address:  Marinda Elk, MD  Relative Name and Phone Number:       Current Level of Care:  (Memory Care Unit) Recommended Level of Care: Memory Care Prior Approval Number:    Date Approved/Denied:   PASRR Number:    Discharge Plan: Other (Comment) (Memory Care)    Current Diagnoses: Patient Active Problem List   Diagnosis Date Noted  . Severe protein-calorie malnutrition (HCC) 07/24/2017  . Dementia 07/23/2017  . Hypernatremia 07/23/2017  . Acute renal failure superimposed on chronic kidney disease (HCC) 07/23/2017  . Toxic encephalopathy 07/23/2017  . Sepsis (HCC) 07/22/2017  . Aspiration pneumonia of both lower lobes due to gastric secretions (HCC) 07/22/2017  . UTI due to Klebsiella species   . Rhabdomyolysis 10/25/2016  . Elevated troponin 10/24/2016  . Acute encephalopathy 09/15/2015  . Acute respiratory failure with hypoxia (HCC) 09/15/2015  . Bradycardia 09/15/2015  . Diabetes mellitus with neuropathy (HCC) 09/15/2015  . Anemia 01/30/2015  . Quadriplegia and quadriparesis (HCC) 08/20/2014  . Gait disorder 08/20/2014  . Weakness 07/23/2014  . Diabetic neuropathy (HCC) 07/23/2014  . Acute renal failure (HCC) 07/23/2014  . HTN (hypertension) 07/23/2014  . Dyslipidemia 07/23/2014    Orientation RESPIRATION BLADDER Height & Weight     Self  Normal Incontinent  Weight: 179 lb 7.3 oz (81.4 kg) Height:  5\' 10"  (177.8 cm)  BEHAVIORAL SYMPTOMS/MOOD NEUROLOGICAL BOWEL NUTRITION STATUS   (Advanced Dementia)   Incontinent Diet: CCHO-Mech. Soft.  AMBULATORY STATUS COMMUNICATION OF NEEDS  Skin   Extensive Assist Verbally Normal                       Personal Care Assistance Level of Assistance  Bathing, Feeding, Dressing Bathing Assistance: Maximum assistance Feeding assistance: Limited assistance Dressing Assistance: Maximum assistance     Functional Limitations Info  Sight, Hearing, Speech Sight Info: Adequate Hearing Info: Impaired Speech Info: Adequate    SPECIAL CARE FACTORS FREQUENCY  OT (By licensed OT) PT (By licensed PT)     PT Frequency: Min 3X/week OT Frequency: Min 3X/week            Contractures Contractures Info: Not present    Additional Factors Info  Code Status, Allergies Code Status Info: Fullcode  Allergies Info: Fluvirin Influenza Vac Split Quad           Current Medications (07/26/2017):  This is the current hospital active medication list Current Facility-Administered Medications  Medication Dose Route Frequency Provider Last Rate Last Dose  . 0.9 %  sodium chloride infusion  1,000 mL Intravenous Continuous Marinda Elk, MD   Stopped at 07/25/17 1636  . acetaminophen (TYLENOL) tablet 500 mg  500 mg Oral Q6H PRN Madelyn Flavors A, MD   500 mg at 07/24/17 1808  . amLODipine (NORVASC) tablet 5 mg  5 mg Oral Daily Madelyn Flavors A, MD   5 mg at 07/25/17 0805  . amoxicillin-clavulanate (AUGMENTIN) 875-125 MG per tablet 1 tablet  1 tablet Oral BID WC Marinda Elk, MD   1 tablet at 07/25/17 1723  . cholecalciferol (VITAMIN D) tablet 1,000 Units  1,000 Units Oral QPM Marinda Elk, MD   1,000 Units at 07/25/17 1723  . feeding supplement (GLUCERNA SHAKE) (GLUCERNA SHAKE) liquid 237 mL  237 mL Oral TID BM Marinda Elk, MD   237 mL at 07/25/17 2000  . ferrous sulfate tablet 325 mg  325 mg Oral Q breakfast Madelyn Flavors A, MD   325 mg at 07/25/17 0805  . furosemide (LASIX) tablet 20 mg  20 mg Oral Daily Marinda Elk, MD   20 mg at 07/25/17 1036  . guaiFENesin (MUCINEX) 12 hr tablet 600 mg  600 mg  Oral BID Katrinka Blazing, Rondell A, MD   600 mg at 07/25/17 2200  . haloperidol lactate (HALDOL) injection 1 mg  1 mg Intravenous Q6H PRN Marinda Elk, MD      . heparin injection 5,000 Units  5,000 Units Subcutaneous Q8H Madelyn Flavors A, MD   5,000 Units at 07/25/17 2200  . insulin aspart (novoLOG) injection 0-15 Units  0-15 Units Subcutaneous TID WC Marinda Elk, MD   5 Units at 07/25/17 1722  . insulin aspart (novoLOG) injection 0-5 Units  0-5 Units Subcutaneous QHS Marinda Elk, MD      . insulin aspart (novoLOG) injection 3 Units  3 Units Subcutaneous TID WC Marinda Elk, MD   3 Units at 07/25/17 1722  . insulin detemir (LEVEMIR) injection 10 Units  10 Units Subcutaneous BID Marinda Elk, MD   10 Units at 07/25/17 2200  . ondansetron (ZOFRAN) tablet 4 mg  4 mg Oral Q6H PRN Madelyn Flavors A, MD       Or  . ondansetron (ZOFRAN) injection 4 mg  4 mg Intravenous Q6H PRN Clydie Braun, MD         Discharge Medications: Please see discharge summary for a list of discharge medications. Medication List     STOP taking these medications   cefUROXime 250 MG tablet Commonly known as:  CEFTIN     TAKE these medications   acetaminophen 500 MG tablet Commonly known as:  TYLENOL Take 1 tablet (500 mg total) by mouth every 6 (six) hours as needed (pain).   amLODipine 5 MG tablet Commonly known as:  NORVASC Take 1 tablet (5 mg total) by mouth daily.   amoxicillin-clavulanate 875-125 MG tablet Commonly known as:  AUGMENTIN Take 1 tablet by mouth 2 (two) times daily with a meal.    cholecalciferol 1000 units tablet Commonly known as:  VITAMIN D Take 1,000 Units by mouth every evening.   ferrous sulfate 325 (65 FE) MG tablet Take 325 mg by mouth daily with breakfast.   furosemide 20 MG tablet Commonly known as:  LASIX Take 1 tablet (20 mg total) by mouth daily.   insulin lispro 100 UNIT/ML injection Commonly known as:  HUMALOG Inject 10-14  Units into the skin 2 (two) times daily. At 1100 and 1600 per sliding scale - if BS is 0-200 = 10 units, 201-249 = 12 units, 250+ = 14 units   HUMALOG 100 UNIT/ML injection Generic drug:  insulin lispro Inject 8-11 Units into the skin daily. At 0700 per sliding scale- if BS is 0-200 = 8 units, 201-249 = 9 units, 250+= 11 units   lisinopril 5 MG tablet Commonly known as:  PRINIVIL,ZESTRIL Take 5 mg by mouth daily. What changed:  Another medication with the same name was removed. Continue taking this medication, and follow the directions you see here.   TOUJEO SOLOSTAR 300 UNIT/ML Sopn Generic  drug:  Insulin Glargine Inject 6-27 Units into the skin daily. Inject 6 units in the morning at 0700 and inject 27 units at 1700     Relevant Imaging Results:  Relevant Lab Results:   Additional Information SS# [id number]. Pt and OT services are needed at ALF. Per physician D/C summary: We'll obtain Augmentin for 3 days. Clearance Coots, LCSW

## 2017-07-26 NOTE — Progress Notes (Addendum)
CSW assisting with discharge back to [facility].  Facility nursing staff reviewing discharge summary and fl2.   11:50am- CSW spoke with patient daughter inform her of discharge to facility.  [facility] called for transport, nurse given number for report.   Vivi Barrack, Theresia Majors, MSW Clinical Social Worker 5E and Psychiatric Service Line [phone] 07/26/2017  11:51 AM

## 2017-07-26 NOTE — Progress Notes (Signed)
Date: July 26, 2017  Discharge orders review for case management needs.  None found  Per patient or family member no additional needs at home. Marcelle Smiling, BSN, RN3, CCM:  [phone]

## 2017-07-26 NOTE — Discharge Summary (Signed)
Physician Discharge Summary  Javier Murillo WUX:[id number] DOB: 1930-02-19 DOA: 07/22/2017  PCP: Juluis Rainier, MD  Admit date: 07/22/2017 Discharge date: 07/26/2017  Admitted From: home Disposition:  Home  Recommendations for Outpatient Follow-up:  1. Transfer back to memory unit. 2. We'll obtain Augmentin for 3 days. 3. Continue dysphagia 3 with diet.  Home Health:No Equipment/Devices:none  Discharge Condition:stable CODE STATUS:full Diet recommendation: Dysphagia 3  Brief/Interim Summary: 83 year old with past medical history of essential hypertension, advanced dementia diabetes mellitus type 2 percent with weakness and tiredness which later developed into lethargy over the last few days. Chest x-ray shows possible bilateral lower lobe pneumonia.  Discharge Diagnoses:  Sepsis due to aspiration pneumonia: Admitted to the hospital start on IV Rocephin and azithromycin. His encephalopathy did improve according to the daughter, swallowing evaluation was done that showed moderate risk for aspiration. His antibiotics were changed to Unasyn orally which she will continue as an outpatient for 7 days. Speech therapy recommended thickened liquids as he tends to aspirate with liquid diet. The family opted to be more vigilant about drinking fluids instead of thickening his liquids.  Hypoglycemia/diabetes mellitus type 2 with peripheral neuropathy: Goal A1c for a demented patient is less than 8. Remained controlled in house.  Acute kidney injury on chronic renal disease stage III: Creatinine remained at baseline no changes made to his medication.  Hyponatremia Resolved, likely due to dehydration resolved with IV fluid hydration.  Toxic encephalopathy: Likely related to infectious etiology now resolved.  Essential hypertension: No changes were made.  Advanced dementia: Remained stable continue current regimen.  Severe protein caloric malnutrition: Malnutrition  Type:  Nutrition Problem: Inadequate oral intake Etiology: poor appetite   Malnutrition Characteristics:  Signs/Symptoms: per patient/family report   Nutrition Interventions:  Interventions: Glucerna shake     Discharge Instructions  Discharge Instructions    Diet - low sodium heart healthy    Complete by:  As directed    Increase activity slowly    Complete by:  As directed      Allergies as of 07/26/2017      Reactions   Fluvirin [influenza Vac Split Quad] Other (See Comments)   Gave patient flu      Medication List    STOP taking these medications   cefUROXime 250 MG tablet Commonly known as:  CEFTIN     TAKE these medications   acetaminophen 500 MG tablet Commonly known as:  TYLENOL Take 1 tablet (500 mg total) by mouth every 6 (six) hours as needed (pain).   amLODipine 5 MG tablet Commonly known as:  NORVASC Take 1 tablet (5 mg total) by mouth daily.   amoxicillin-clavulanate 875-125 MG tablet Commonly known as:  AUGMENTIN Take 1 tablet by mouth 2 (two) times daily with a meal.   cholecalciferol 1000 units tablet Commonly known as:  VITAMIN D Take 1,000 Units by mouth every evening.   ferrous sulfate 325 (65 FE) MG tablet Take 325 mg by mouth daily with breakfast.   furosemide 20 MG tablet Commonly known as:  LASIX Take 1 tablet (20 mg total) by mouth daily.   insulin lispro 100 UNIT/ML injection Commonly known as:  HUMALOG Inject 10-14 Units into the skin 2 (two) times daily. At 1100 and 1600 per sliding scale - if BS is 0-200 = 10 units, 201-249 = 12 units, 250+ = 14 units   HUMALOG 100 UNIT/ML injection Generic drug:  insulin lispro Inject 8-11 Units into the skin daily. At 0700 per sliding scale- if  BS is 0-200 = 8 units, 201-249 = 9 units, 250+= 11 units   lisinopril 5 MG tablet Commonly known as:  PRINIVIL,ZESTRIL Take 5 mg by mouth daily. What changed:  Another medication with the same name was removed. Continue taking this  medication, and follow the directions you see here.   TOUJEO SOLOSTAR 300 UNIT/ML Sopn Generic drug:  Insulin Glargine Inject 6-27 Units into the skin daily. Inject 6 units in the morning at 0700 and inject 27 units at 1700      Contact information for after-discharge care    Destination    [facility] .   Specialty:  Assisted Living Facility Contact information: [address] [place] [address] [phone]             Allergies  Allergen Reactions  . Fluvirin [Influenza Vac Split Quad] Other (See Comments)    Gave patient flu    Consultations:  None   Procedures/Studies: X-ray Chest Pa And Lateral  Result Date: 07/23/2017 CLINICAL DATA:  Sepsis EXAM: CHEST  2 VIEW COMPARISON:  07/22/17 FINDINGS: Cardiac shadow is stable. Bibasilar atelectatic changes are noted slightly increased from the prior exam. No sizable effusion is seen. No acute bony abnormality is noted. IMPRESSION: Slight increase in bibasilar atelectasis. Electronically Signed   By: Alcide Clever M.D.   On: 07/23/2017 08:09   Dg Chest 2 View  Result Date: 07/22/2017 CLINICAL DATA:  Hypoglycemia. Elevated white count. Lethargic. Smoking history. EXAM: CHEST  2 VIEW COMPARISON:  02/21/2016 FINDINGS: Heart size is normal. There is chronic aortic atherosclerosis. Lucency of the lungs in the upper chest suggests emphysema. There is new linear atelectasis in the right lower lung. There is mild patchy density in the left lower lobe that could represent atelectasis or mild left lower lobe pneumonia. No effusions. No acute bone finding. Chronic lower thoracic compression fracture. IMPRESSION: Background emphysema. Aortic atherosclerosis. Atelectasis in both lower lungs which could be simple atelectasis or atelectatic pneumonia. Electronically Signed   By: Paulina Fusi M.D.   On: 07/22/2017 14:46   US Renal  Result Date: 07/23/2017 CLINICAL DATA:  Acute renal failure EXAM: RENAL /  URINARY TRACT ULTRASOUND COMPLETE COMPARISON:  CT 11/10/2010 FINDINGS: Right Kidney: Length: 7.9 cm. Negative for hydronephrosis. Tiny hypoechoic nodule upper pole right kidney measuring 8 mm. Left Kidney: Length: 10.1 cm. Echogenicity within normal limits. Mild left hydronephrosis. Bladder: Appears normal for degree of bladder distention. IMPRESSION: 1. Asymmetric renal size, right smaller than left, raises possibility for mild right renal atrophy. 2. Mild left hydronephrosis 3. 8 mm hypoechoic nodule upper pole right kidney, possible small cyst with scattered echoes but further evaluation difficult due to small size. Electronically Signed   By: Jasmine Pang M.D.   On: 07/23/2017 01:26   Dg Swallowing Func-speech Pathology  Result Date: 07/24/2017 Objective Swallowing Evaluation: Type of Study: MBS-Modified Barium Swallow Study Patient Details Name: Javier Murillo MRN: [id number] Date of Birth: [birth year] Today's Date: 07/24/2017 Time: SLP Start Time (ACUTE ONLY): 0848-SLP Stop Time (ACUTE ONLY): 0905 SLP Time Calculation (min) (ACUTE ONLY): 17 min Past Medical History: Past Medical History: Diagnosis Date . Arthritis  . Chronic low back pain  . Degenerative arthritis  . Depression  . Diabetes mellitus without complication (HCC)  . Diabetic peripheral neuropathy (HCC)  . Gait disorder  . Hyperlipidemia  . Hypertension  . Kidney stones   only once . Lumbosacral spondylosis  . Peripheral vascular disease (HCC)  . Quadriplegia and  quadriparesis (HCC) 08/20/2014 Past Surgical History: Past Surgical History: Procedure Laterality Date . ANTERIOR CERVICAL DECOMP/DISCECTOMY FUSION N/A 08/22/2014  Procedure: ANTERIOR CERVICAL DECOMPRESSION/DISCECTOMY FUSION, cervical three-four;  Surgeon: Hewitt Shorts, MD;  Location: [facility];  Service: Neurosurgery;  Laterality: N/A;  C3-4 anterior cervical decompression with fusion plating and bonegraft . APPENDECTOMY   . BACK SURGERY    x 7 . bone spur removed  right sholder    . bursitis Bilateral   olecranon I&D . CATARACT EXTRACTION W/ INTRAOCULAR LENS  IMPLANT, BILATERAL   . EYE SURGERY    bilateral cataracts . LUMBAR SPINE SURGERY    x7 . SHOULDER SURGERY Bilateral  HPI: DAEMIAN GAHM a 83 y.o.malewith medical history significant ofHTN, HLD, dementia, DM type II;who presented with complaints of weakness and tiredness. Chest x ray with atelectasis vs PNA; BSE on 07/23/17 indicated frequent throat clearing during meal and family reports this happens during meals often; Dysphagia 3 (mechanical/soft)/thin liquids recommended with BSE completion. No Data Recorded Assessment / Plan / Recommendation CHL IP CLINICAL IMPRESSIONS 07/24/2017 Clinical Impression Pt exhibits moderate oropharyngeal dysphagia; mild esophageal phase dysphagia; oral phase characterized by oral holding and disorganized swallow with various consistencies; impaired mastication with solids and delay in the initiation of the swallow with all consistencies to valleculae/pyriform sinuses with thin/puree/solids (pyriform sinuses only with larger amounts of thin via cup/straw) and eventual deep penetration and/or trace aspiration with thin via cup with larger amounts; smaller amounts of thin yielded flash penetration with one swallow, but cleared laryngeal vestibule; nectar-thickened liquids eliminated this occurrence via cup sips; no aspiration/penetration with puree/solids; esophageal backflow into the distal esophagus noted at end of the study after all consistencies; consequently, this may be a concern during meal intake without swallowing precautions utilized for repetitive swallows, small sips/bites, slow rate and full supervision with meals; recommend Dysphagia 3 (mechanical/soft)/thin liquids via small sips only and/or tsp amounts with full supervision with compensatory strategies required; if pt continues to have difficulty during meals despite swallowing precautions, nectar-thickened liquids may be considered  d/t moderate risk of aspiration paired with decreased cognitive status SLP Visit Diagnosis Dysphagia, oropharyngeal phase (R13.12);Dysphagia, pharyngoesophageal phase (R13.14) Attention and concentration deficit following -- Frontal lobe and executive function deficit following -- Impact on safety and function Moderate aspiration risk   CHL IP TREATMENT RECOMMENDATION 07/24/2017 Treatment Recommendations Therapy as outlined in treatment plan below   Prognosis 07/24/2017 Prognosis for Safe Diet Advancement Good Barriers to Reach Goals Cognitive deficits Barriers/Prognosis Comment -- CHL IP DIET RECOMMENDATION 07/24/2017 SLP Diet Recommendations Dysphagia 3 (Mech soft) solids;Thin liquid;Other (small sips only) Liquid Administration via Spoon;Cup;No straw Medication Administration Whole meds with puree Compensations Minimize environmental distractions;Slow rate;Small sips/bites;Clear throat intermittently Postural Changes Remain semi-upright after after feeds/meals (Comment);Seated upright at 90 degrees   CHL IP OTHER RECOMMENDATIONS 07/24/2017 Recommended Consults -- Oral Care Recommendations Oral care BID Other Recommendations --   CHL IP FOLLOW UP RECOMMENDATIONS 07/24/2017 Follow up Recommendations Other (TBD)   CHL IP FREQUENCY AND DURATION 07/24/2017 Speech Therapy Frequency (ACUTE ONLY) min 2x/week Treatment Duration 1 week      CHL IP ORAL PHASE 07/24/2017 Oral Phase Impaired Oral - Pudding Teaspoon -- Oral - Pudding Cup -- Oral - Honey Teaspoon -- Oral - Honey Cup -- Oral - Nectar Teaspoon -- Oral - Nectar Cup -- Oral - Nectar Straw -- Oral - Thin Teaspoon Holding of bolus Oral - Thin Cup Other (disorganized swallow) Oral - Thin Straw -- Oral - Puree Holding of  bolus;Other (disorganized swallow) Oral - Mech Soft Decreased bolus cohesion Oral - Regular -- Oral - Multi-Consistency -- Oral - Pill -- Oral Phase - Comment --  CHL IP PHARYNGEAL PHASE 07/24/2017 Pharyngeal Phase Impaired Pharyngeal- Pudding Teaspoon --  Pharyngeal -- Pharyngeal- Pudding Cup -- Pharyngeal -- Pharyngeal- Honey Teaspoon -- Pharyngeal -- Pharyngeal- Honey Cup -- Pharyngeal -- Pharyngeal- Nectar Teaspoon -- Pharyngeal -- Pharyngeal- Nectar Cup Delayed swallow initiation-vallecula Pharyngeal -- Pharyngeal- Nectar Straw -- Pharyngeal -- Pharyngeal- Thin Teaspoon Delayed swallow initiation-vallecula Pharyngeal -- Pharyngeal- Thin Cup Delayed swallow initiation-pyriform sinuses;Penetration/Aspiration during swallow Pharyngeal -- Pharyngeal- Thin Straw Delayed swallow initiation-pyriform sinuses;Reduced airway/laryngeal closure;Penetration/Aspiration during swallow;Trace aspiration Pharyngeal -- Pharyngeal- Puree Delayed swallow initiation-vallecula Pharyngeal -- Pharyngeal- Mechanical Soft Delayed swallow initiation-vallecula Pharyngeal -- Pharyngeal- Regular -- Pharyngeal -- Pharyngeal- Multi-consistency -- Pharyngeal -- Pharyngeal- Pill -- Pharyngeal -- Pharyngeal Comment --  CHL IP CERVICAL ESOPHAGEAL PHASE 07/24/2017 Cervical Esophageal Phase Impaired Pudding Teaspoon -- Pudding Cup -- Honey Teaspoon -- Honey Cup -- Nectar Teaspoon -- Nectar Cup -- Nectar Straw -- Thin Teaspoon -- Thin Cup -- Thin Straw Esophageal backflow into cervical esophagus Puree -- Mechanical Soft Esophageal backflow into cervical esophagus Regular -- Multi-consistency -- Pill -- Cervical Esophageal Comment (No Data) No flowsheet data found. Tressie Stalker, M.S., CCC-SLP 07/24/2017, 9:29 AM                Subjective: Nonverbal.  Discharge Exam: Vitals:   07/25/17 2013 07/26/17 0445  BP: 139/70 (!) 143/81  Pulse: 71 76  Resp: 16 15  Temp: 98.6 F (37 C) 97.9 F (36.6 C)  SpO2: 99% 99%   Vitals:   07/25/17 0805 07/25/17 1355 07/25/17 2013 07/26/17 0445  BP: (!) 141/70 (!) 130/53 139/70 (!) 143/81  Pulse:  80 71 76  Resp:  16 16 15   Temp:  98.1 F (36.7 C) 98.6 F (37 C) 97.9 F (36.6 C)  TempSrc:  Oral Oral Oral  SpO2:  97% 99% 99%  Weight:  81.4 kg (179 lb  7.3 oz)    Height:        General: In no acute distress. Cardiovascular: Regular rate and rhythm with positive S1-S2. Respiratory: Good air movement clear to auscultation. Abdominal: Possible bowel sounds soft nontender nondistended. Extremities: No lower extremity edema.    The results of significant diagnostics from this hospitalization (including imaging, microbiology, ancillary and laboratory) are listed below for reference.     Microbiology: Recent Results (from the past 240 hour(s))  Culture, blood (routine x 2) Call MD if unable to obtain prior to antibiotics being given     Status: None (Preliminary result)   Collection Time: 07/22/17 10:40 PM  Result Value Ref Range Status   Specimen Description BLOOD RIGHT ANTECUBITAL  Final   Special Requests IN PEDIATRIC BOTTLE Blood Culture adequate volume  Final   Culture   Final    NO GROWTH 2 DAYS Performed at [facility], [address], [place], [place] [address]    Report Status PENDING  Incomplete  Culture, blood (routine x 2) Call MD if unable to obtain prior to antibiotics being given     Status: None (Preliminary result)   Collection Time: 07/22/17 10:50 PM  Result Value Ref Range Status   Specimen Description BLOOD LEFT ANTECUBITAL  Final   Special Requests   Final    BOTTLES DRAWN AEROBIC AND ANAEROBIC Blood Culture adequate volume   Culture   Final    NO GROWTH 2 DAYS Performed at  [facility], [address], [place], [place] [address]    Report Status PENDING  Incomplete  MRSA PCR Screening     Status: None   Collection Time: 07/22/17 11:35 PM  Result Value Ref Range Status   MRSA by PCR NEGATIVE NEGATIVE Final    Comment:        The GeneXpert MRSA Assay (FDA approved for NASAL specimens only), is one component of a comprehensive MRSA colonization surveillance program. It is not intended to diagnose MRSA infection nor to guide or monitor treatment for MRSA infections.      Labs: BNP  (last 3 results) No results for input(s): BNP in the last 8760 hours. Basic Metabolic Panel:  Recent Labs Lab 07/22/17 1444 07/23/17 0709 07/24/17 0822 07/25/17 0616 07/26/17 0526  NA 148* 144 140 144 138  K 3.9 4.1 4.8 3.8 3.7  CL 112* 113* 108 112* 107  CO2 26 23 21* 25 24  GLUCOSE 53* 205* 219* 91 132*  BUN 60* 50* 38* 33* 30*  CREATININE 2.60* 2.13* 1.94* 1.80* 1.86*  CALCIUM 9.5 8.7* 8.6* 8.6* 8.5*   Liver Function Tests:  Recent Labs Lab 07/22/17 1444  AST 42*  ALT 37  ALKPHOS 60  BILITOT 0.7  PROT 7.1  ALBUMIN 3.4*   No results for input(s): LIPASE, AMYLASE in the last 168 hours. No results for input(s): AMMONIA in the last 168 hours. CBC:  Recent Labs Lab 07/22/17 1500 07/23/17 0709 07/24/17 0822  WBC 25.0* 20.2* 18.6*  NEUTROABS 21.2*  --   --   HGB 12.4* 11.8* 12.4*  HCT 38.0* 36.7* 36.7*  MCV 100.0 99.2 98.4  PLT 425* 351 346   Cardiac Enzymes: No results for input(s): CKTOTAL, CKMB, CKMBINDEX, TROPONINI in the last 168 hours. BNP: Invalid input(s): POCBNP CBG:  Recent Labs Lab 07/25/17 0727 07/25/17 1139 07/25/17 1615 07/25/17 2008 07/26/17 0724  GLUCAP 76 146* 226* 180* 127*   D-Dimer No results for input(s): DDIMER in the last 72 hours. Hgb A1c  Recent Labs  07/24/17 0822  HGBA1C 7.6*   Lipid Profile No results for input(s): CHOL, HDL, LDLCALC, TRIG, CHOLHDL, LDLDIRECT in the last 72 hours. Thyroid function studies No results for input(s): TSH, T4TOTAL, T3FREE, THYROIDAB in the last 72 hours.  Invalid input(s): FREET3 Anemia work up No results for input(s): VITAMINB12, FOLATE, FERRITIN, TIBC, IRON, RETICCTPCT in the last 72 hours. Urinalysis    Component Value Date/Time   COLORURINE YELLOW 07/22/2017 1444   APPEARANCEUR CLEAR 07/22/2017 1444   LABSPEC 1.020 07/22/2017 1444   PHURINE 5.0 07/22/2017 1444   GLUCOSEU NEGATIVE 07/22/2017 1444   GLUCOSEU >=1000 (A) 06/04/2016 1618   HGBUR SMALL (A) 07/22/2017 1444    BILIRUBINUR NEGATIVE 07/22/2017 1444   KETONESUR 5 (A) 07/22/2017 1444   PROTEINUR NEGATIVE 07/22/2017 1444   UROBILINOGEN 0.2 06/04/2016 1618   NITRITE NEGATIVE 07/22/2017 1444   LEUKOCYTESUR NEGATIVE 07/22/2017 1444   Sepsis Labs Invalid input(s): PROCALCITONIN,  WBC,  LACTICIDVEN Microbiology Recent Results (from the past 240 hour(s))  Culture, blood (routine x 2) Call MD if unable to obtain prior to antibiotics being given     Status: None (Preliminary result)   Collection Time: 07/22/17 10:40 PM  Result Value Ref Range Status   Specimen Description BLOOD RIGHT ANTECUBITAL  Final   Special Requests IN PEDIATRIC BOTTLE Blood Culture adequate volume  Final   Culture   Final    NO GROWTH 2 DAYS Performed at [facility],  [address] [place], [place] [address]    Report Status PENDING  Incomplete  Culture, blood (routine x 2) Call MD if unable to obtain prior to antibiotics being given     Status: None (Preliminary result)   Collection Time: 07/22/17 10:50 PM  Result Value Ref Range Status   Specimen Description BLOOD LEFT ANTECUBITAL  Final   Special Requests   Final    BOTTLES DRAWN AEROBIC AND ANAEROBIC Blood Culture adequate volume   Culture   Final    NO GROWTH 2 DAYS Performed at [facility], [address], [place], [place] [address]    Report Status PENDING  Incomplete  MRSA PCR Screening     Status: None   Collection Time: 07/22/17 11:35 PM  Result Value Ref Range Status   MRSA by PCR NEGATIVE NEGATIVE Final    Comment:        The GeneXpert MRSA Assay (FDA approved for NASAL specimens only), is one component of a comprehensive MRSA colonization surveillance program. It is not intended to diagnose MRSA infection nor to guide or monitor treatment for MRSA infections.      Time coordinating discharge: Over 30 minutes  SIGNED:   Marinda Elk, MD  [facility] 07/26/2017, 8:04 AM Pager   If 7PM-7AM, please contact  night-coverage [email or web address] Password [email or web address]

## 2017-07-26 NOTE — Progress Notes (Signed)
Report called to nurse, Lelon Perla at [facility].  Patient transported via EMS.

## 2017-07-28 LAB — CULTURE, BLOOD (ROUTINE X 2)
CULTURE: NO GROWTH
Culture: NO GROWTH
SPECIAL REQUESTS: ADEQUATE
SPECIAL REQUESTS: ADEQUATE

## 2017-08-03 ENCOUNTER — Ambulatory Visit: Payer: Medicare Other | Admitting: Endocrinology

## 2017-09-06 ENCOUNTER — Ambulatory Visit (INDEPENDENT_AMBULATORY_CARE_PROVIDER_SITE_OTHER): Payer: Medicare Other | Admitting: Endocrinology

## 2017-09-06 VITALS — BP 100/60 | HR 66 | Ht 70.0 in | Wt 159.0 lb

## 2017-09-06 DIAGNOSIS — E1165 Type 2 diabetes mellitus with hyperglycemia: Secondary | ICD-10-CM | POA: Diagnosis not present

## 2017-09-06 DIAGNOSIS — Z794 Long term (current) use of insulin: Secondary | ICD-10-CM

## 2017-09-06 LAB — POCT GLYCOSYLATED HEMOGLOBIN (HGB A1C): Hemoglobin A1C: 7.2

## 2017-09-06 LAB — POCT GLUCOSE (DEVICE FOR HOME USE): POC GLUCOSE: 132 mg/dL — AB (ref 70–99)

## 2017-09-06 NOTE — Addendum Note (Signed)
Addended by: Gregery Na on: 09/06/2017 04:39 PM   Modules accepted: Orders

## 2017-09-06 NOTE — Patient Instructions (Addendum)
3x week check sugar at 9 pm  Check coverage for Tresiba, Lantus and Basaglar instead of [employer]

## 2017-09-06 NOTE — Progress Notes (Signed)
Patient ID: Javier Murillo, male   DOB: 12/28/1929, 83 y.o.   MRN: [id number]           Reason for Appointment: Follow-up for Type 2 Diabetes  Referring physician: Zachery Dauer  History of Present Illness:          Diagnosis: Type 2 diabetes mellitus, date of diagnosis:1975        Past history: He may have been treated with oral agents for the first few years of his diabetes management He thinks he has been on insulin for several years, uncertain how long. For some reason he has been continued on glyburide also which she probably has been taking for a long time Not clear if he had taken metformin or why it was stopped  He has had fair control of his diabetes over the last 2-3 years with A1c ranging from 7.4-9.3, relatively higher since 10/2013  On his initial consultation he had been on a fixed dose of Lantus along with sliding scale Humalog based on blood sugar levels before meals Because of persistently high A1c of 9% or more since 06/2014 he seen in consultation and he was started on pre-meal insulin along with correction doses His glyburide stopped because of his age and needing basal bolus insulin   Recent history:  INSULIN regimen is described as:  6 units in the morning and Toujeo 27 units in the evening   Mealtime coverage based on glucose readings: BREAKFAST doses are as follows: Blood sugar under 200 = 7 units, 200-249 = 8 units.  250+ = 9 units  LUNCH and dinner doses: With blood sugar under 200 = 10 units 200-249 = 12 units and 250+ = 14 units  He has been on a regimen of basal bolus insulin regimen as monotherapy for diabetes  His A1c is still reasonably good at just above 7% as before  Current blood sugar patterns and problems identified:  Blood sugars are reviewed from his nursing home record  FASTING blood sugars are generally fairly good although there were somewhat lower about 2 weeks ago  He appears to have lost weight but his weight today was not accurate  He  is apparently eating fairly well completing his meals; again his nursing home is not giving him consistently diabetic diet  Patient unable to give any history by himself today His insulin doses have not been changed although he has not been seen in follow-up for over 4 months His son is concerned about the cost of his insulins probably did Toujeo more so Blood sugars are not being done after his evening meal  There is no formatting for his blood sugar readings, nursing home is sending a list of his readings  Glucose monitoring:  done 3 times a day.  Glucometer: Unknown     PRE-MEAL Fasting Lunch Dinner Bedtime Overall  Glucose range: 72-183   1 37-244   80-1 72  ?     Mean/median:            Blood Glucose readings by time of day from recent record of nursing home as above.     Diet: He Has a fairly good appetite, he apparently eats whatever he is given at the nursing home including now a full peanut butter sandwich at bedtime       Self-care:     Meals: 3 meals per day. Breakfast is eggs, toast, sausage.  Lunch and dinner usually a meat or fish along with vegetables, bread and sugar-free ice  cream.  Usually has sugar-free snacks           Physical activity:  none, in wheelchair         Dietician visit, most recent: Never .               Weight history:  Wt Readings from Last 3 Encounters:  09/06/17 159 lb (72.1 kg)  07/25/17 179 lb 7.3 oz (81.4 kg)  04/29/17 175 lb 6.4 oz (79.6 kg)    Glycemic control:   Lab Results  Component Value Date   HGBA1C 7.2 09/06/2017   HGBA1C 7.6 (H) 07/24/2017   HGBA1C 7.3 04/29/2017   Lab Results  Component Value Date   MICROALBUR 1.6 06/04/2016   LDLCALC 92 04/29/2017   CREATININE 1.86 (H) 07/26/2017     Allergies as of 09/06/2017      Reactions   Fluvirin [influenza Vac Split Quad] Other (See Comments)   Gave patient flu      Medication List       Accurate as of 09/06/17  2:05 PM. Always use your most recent med list.           acetaminophen 500 MG tablet Commonly known as:  TYLENOL Take 1 tablet (500 mg total) by mouth every 6 (six) hours as needed (pain).   amLODipine 5 MG tablet Commonly known as:  NORVASC Take 1 tablet (5 mg total) by mouth daily.   cholecalciferol 1000 units tablet Commonly known as:  VITAMIN D Take 1,000 Units by mouth every evening.   ferrous sulfate 325 (65 FE) MG tablet Take 325 mg by mouth daily with breakfast.   furosemide 20 MG tablet Commonly known as:  LASIX Take 1 tablet (20 mg total) by mouth daily.   insulin lispro 100 UNIT/ML injection Commonly known as:  HUMALOG Inject 10-14 Units into the skin 2 (two) times daily. At 1100 and 1600 per sliding scale - if BS is 0-200 = 10 units, 201-249 = 12 units, 250+ = 14 units   HUMALOG 100 UNIT/ML injection Generic drug:  insulin lispro Inject 8-11 Units into the skin daily. At 0700 per sliding scale- if BS is 0-200 = 8 units, 201-249 = 9 units, 250+= 11 units   lisinopril 5 MG tablet Commonly known as:  PRINIVIL,ZESTRIL Take 5 mg by mouth daily.   TOUJEO SOLOSTAR 300 UNIT/ML Sopn Generic drug:  Insulin Glargine Inject 6-27 Units into the skin daily. Inject 6 units in the morning at 0700 and inject 27 units at 1700       Allergies:  Allergies  Allergen Reactions  . Fluvirin [Influenza Vac Split Quad] Other (See Comments)    Gave patient flu    Past Medical History:  Diagnosis Date  . Arthritis   . Chronic low back pain   . Degenerative arthritis   . Depression   . Diabetes mellitus without complication (HCC)   . Diabetic peripheral neuropathy (HCC)   . Gait disorder   . Hyperlipidemia   . Hypertension   . Kidney stones    only once  . Lumbosacral spondylosis   . Peripheral vascular disease (HCC)   . Quadriplegia and quadriparesis (HCC) 08/20/2014    Past Surgical History:  Procedure Laterality Date  . ANTERIOR CERVICAL DECOMP/DISCECTOMY FUSION N/A 08/22/2014   Procedure: ANTERIOR CERVICAL  DECOMPRESSION/DISCECTOMY FUSION, cervical three-four;  Surgeon: Hewitt Shorts, MD;  Location: [facility];  Service: Neurosurgery;  Laterality: N/A;  C3-4 anterior cervical decompression with fusion plating and bonegraft  .  APPENDECTOMY    . BACK SURGERY     x 7  . bone spur removed  right sholder    . bursitis Bilateral    olecranon I&D  . CATARACT EXTRACTION W/ INTRAOCULAR LENS  IMPLANT, BILATERAL    . EYE SURGERY     bilateral cataracts  . LUMBAR SPINE SURGERY     x7  . SHOULDER SURGERY Bilateral     Family History  Problem Relation Age of Onset  . Cancer Mother   . Heart attack Father   . Dementia Sister     Social History:  reports that he quit smoking about 56 years ago. His smoking use included Cigarettes. He has a 1.25 pack-year smoking history. He has never used smokeless tobacco. He reports that he does not drink alcohol or use drugs.    Review of Systems   He was admitted for pneumonia and aspiration is now on some dysphagia precautions       Lipids: not on any medication for hyperlipidemia, LDL is well controlled       Lab Results  Component Value Date   CHOL 167 04/29/2017   HDL 57.90 04/29/2017   LDLCALC 92 04/29/2017   TRIG 87.0 04/29/2017   CHOLHDL 3 04/29/2017               He Has chronic weakness in his legs from central cord problems previously and not able to ambulate  History of leg edema especially on the left followed by PCP   Physical Examination:  BP 100/60   Pulse 66   Ht  (1.778 m)   Wt 159 lb (72.1 kg)   SpO2 94%   BMI 22.81 kg/m     ASSESSMENT:  Diabetes type 2, uncontrolled  See history of present illness for detailed discussion of his current management, blood sugar patterns and problems identified  He is on a basal bolus insulin regimen with Toujeo, Now getting this twice a day since an evening dose was not apparently covering his sugars to the next evening  A1c is still fairly good around 7% Hypoglycemia not  present He is still having some variability in his blood sugars at all times some: Lowest readings after supper and morning readings are fairly close to target His diet is still variable and now getting a large snack at night  PLAN:  Will increase frequency of glucose monitoring to had a reading at 9 PM every 3 days or so He needs to reduce Toujeo by 2 units in the evening for now, written instructions given His son will check with insurance about coverage for our tentative branch for his insulins which may be less expensive  Follow-up in 3 months   Patient Instructions  3x week check sugar at 9 pm  Check coverage for Evaristo Bury, Lantus and Basaglar instead of Toujeo            [facility] 09/06/2017, 2:05 PM   Note: This office note was prepared with Insurance underwriter. Any transcriptional errors that result from this process are unintentional. Sleep

## 2017-09-22 ENCOUNTER — Inpatient Hospital Stay (HOSPITAL_COMMUNITY)
Admission: EM | Admit: 2017-09-22 | Discharge: 2017-09-26 | DRG: 871 | Disposition: A | Discharge status: Home / Self Care | Payer: Medicare Other | Attending: Internal Medicine | Admitting: Internal Medicine

## 2017-09-22 ENCOUNTER — Emergency Department (HOSPITAL_COMMUNITY): Payer: Medicare Other

## 2017-09-22 ENCOUNTER — Encounter (HOSPITAL_COMMUNITY): Payer: Self-pay | Admitting: Emergency Medicine

## 2017-09-22 DIAGNOSIS — N179 Acute kidney failure, unspecified: Secondary | ICD-10-CM

## 2017-09-22 DIAGNOSIS — F0390 Unspecified dementia without behavioral disturbance: Secondary | ICD-10-CM | POA: Diagnosis present

## 2017-09-22 DIAGNOSIS — A419 Sepsis, unspecified organism: Principal | ICD-10-CM | POA: Diagnosis present

## 2017-09-22 DIAGNOSIS — E1142 Type 2 diabetes mellitus with diabetic polyneuropathy: Secondary | ICD-10-CM | POA: Diagnosis present

## 2017-09-22 DIAGNOSIS — E785 Hyperlipidemia, unspecified: Secondary | ICD-10-CM | POA: Diagnosis present

## 2017-09-22 DIAGNOSIS — N3000 Acute cystitis without hematuria: Secondary | ICD-10-CM

## 2017-09-22 DIAGNOSIS — E1140 Type 2 diabetes mellitus with diabetic neuropathy, unspecified: Secondary | ICD-10-CM | POA: Diagnosis present

## 2017-09-22 DIAGNOSIS — I5032 Chronic diastolic (congestive) heart failure: Secondary | ICD-10-CM | POA: Diagnosis present

## 2017-09-22 DIAGNOSIS — Z9841 Cataract extraction status, right eye: Secondary | ICD-10-CM | POA: Diagnosis not present

## 2017-09-22 DIAGNOSIS — G9340 Encephalopathy, unspecified: Secondary | ICD-10-CM

## 2017-09-22 DIAGNOSIS — N390 Urinary tract infection, site not specified: Secondary | ICD-10-CM | POA: Diagnosis present

## 2017-09-22 DIAGNOSIS — N183 Chronic kidney disease, stage 3 unspecified: Secondary | ICD-10-CM | POA: Diagnosis present

## 2017-09-22 DIAGNOSIS — Z888 Allergy status to other drugs, medicaments and biological substances status: Secondary | ICD-10-CM

## 2017-09-22 DIAGNOSIS — E1151 Type 2 diabetes mellitus with diabetic peripheral angiopathy without gangrene: Secondary | ICD-10-CM | POA: Diagnosis present

## 2017-09-22 DIAGNOSIS — L8990 Pressure ulcer of unspecified site, unspecified stage: Secondary | ICD-10-CM | POA: Insufficient documentation

## 2017-09-22 DIAGNOSIS — Z1639 Resistance to other specified antimicrobial drug: Secondary | ICD-10-CM | POA: Diagnosis present

## 2017-09-22 DIAGNOSIS — G8250 Quadriplegia, unspecified: Secondary | ICD-10-CM | POA: Diagnosis present

## 2017-09-22 DIAGNOSIS — E1122 Type 2 diabetes mellitus with diabetic chronic kidney disease: Secondary | ICD-10-CM | POA: Diagnosis present

## 2017-09-22 DIAGNOSIS — D631 Anemia in chronic kidney disease: Secondary | ICD-10-CM | POA: Diagnosis present

## 2017-09-22 DIAGNOSIS — Z9842 Cataract extraction status, left eye: Secondary | ICD-10-CM | POA: Diagnosis not present

## 2017-09-22 DIAGNOSIS — I10 Essential (primary) hypertension: Secondary | ICD-10-CM | POA: Diagnosis not present

## 2017-09-22 DIAGNOSIS — R531 Weakness: Secondary | ICD-10-CM | POA: Diagnosis present

## 2017-09-22 DIAGNOSIS — Z961 Presence of intraocular lens: Secondary | ICD-10-CM | POA: Diagnosis present

## 2017-09-22 DIAGNOSIS — I130 Hypertensive heart and chronic kidney disease with heart failure and stage 1 through stage 4 chronic kidney disease, or unspecified chronic kidney disease: Secondary | ICD-10-CM | POA: Diagnosis present

## 2017-09-22 DIAGNOSIS — Z79899 Other long term (current) drug therapy: Secondary | ICD-10-CM

## 2017-09-22 DIAGNOSIS — E860 Dehydration: Secondary | ICD-10-CM | POA: Diagnosis present

## 2017-09-22 DIAGNOSIS — E1129 Type 2 diabetes mellitus with other diabetic kidney complication: Secondary | ICD-10-CM | POA: Diagnosis present

## 2017-09-22 DIAGNOSIS — G9341 Metabolic encephalopathy: Secondary | ICD-10-CM | POA: Diagnosis present

## 2017-09-22 DIAGNOSIS — D509 Iron deficiency anemia, unspecified: Secondary | ICD-10-CM | POA: Diagnosis present

## 2017-09-22 DIAGNOSIS — Z87891 Personal history of nicotine dependence: Secondary | ICD-10-CM | POA: Diagnosis not present

## 2017-09-22 DIAGNOSIS — D649 Anemia, unspecified: Secondary | ICD-10-CM | POA: Diagnosis present

## 2017-09-22 LAB — HEPATIC FUNCTION PANEL
ALBUMIN: 3.4 g/dL — AB (ref 3.5–5.0)
ALK PHOS: 68 U/L (ref 38–126)
ALT: 28 U/L (ref 17–63)
AST: 51 U/L — ABNORMAL HIGH (ref 15–41)
Bilirubin, Direct: 0.2 mg/dL (ref 0.1–0.5)
Indirect Bilirubin: 0.5 mg/dL (ref 0.3–0.9)
TOTAL PROTEIN: 7.6 g/dL (ref 6.5–8.1)
Total Bilirubin: 0.7 mg/dL (ref 0.3–1.2)

## 2017-09-22 LAB — BASIC METABOLIC PANEL
Anion gap: 13 (ref 5–15)
BUN: 65 mg/dL — AB (ref 6–20)
CALCIUM: 9.4 mg/dL (ref 8.9–10.3)
CO2: 24 mmol/L (ref 22–32)
CREATININE: 3.23 mg/dL — AB (ref 0.61–1.24)
Chloride: 108 mmol/L (ref 101–111)
GFR calc Af Amer: 19 mL/min — ABNORMAL LOW (ref >60)
GFR, EST NON AFRICAN AMERICAN: 16 mL/min — AB (ref >60)
GLUCOSE: 122 mg/dL — AB (ref 65–99)
POTASSIUM: 4.3 mmol/L (ref 3.5–5.1)
Sodium: 145 mmol/L (ref 135–145)

## 2017-09-22 LAB — URINALYSIS, ROUTINE W REFLEX MICROSCOPIC
Bilirubin Urine: NEGATIVE
Glucose, UA: NEGATIVE mg/dL
Ketones, ur: NEGATIVE mg/dL
NITRITE: NEGATIVE
Protein, ur: 100 mg/dL — AB
SPECIFIC GRAVITY, URINE: 1.013 (ref 1.005–1.030)
SQUAMOUS EPITHELIAL / LPF: NONE SEEN
pH: 5 (ref 5.0–8.0)

## 2017-09-22 LAB — CBC
HEMATOCRIT: 35.5 % — AB (ref 39.0–52.0)
Hemoglobin: 11.5 g/dL — ABNORMAL LOW (ref 13.0–17.0)
MCH: 31.8 pg (ref 26.0–34.0)
MCHC: 32.4 g/dL (ref 30.0–36.0)
MCV: 98.1 fL (ref 78.0–100.0)
PLATELETS: 454 10*3/uL — AB (ref 150–400)
RBC: 3.62 MIL/uL — ABNORMAL LOW (ref 4.22–5.81)
RDW: 13.6 % (ref 11.5–15.5)
WBC: 15.2 10*3/uL — ABNORMAL HIGH (ref 4.0–10.5)

## 2017-09-22 LAB — I-STAT CG4 LACTIC ACID, ED
LACTIC ACID, VENOUS: 1.93 mmol/L — AB (ref 0.5–1.9)
Lactic Acid, Venous: 1.42 mmol/L (ref 0.5–1.9)

## 2017-09-22 LAB — CBG MONITORING, ED: GLUCOSE-CAPILLARY: 105 mg/dL — AB (ref 65–99)

## 2017-09-22 MED ORDER — INSULIN GLARGINE 100 UNIT/ML ~~LOC~~ SOLN
14.0000 [IU] | Freq: Every day | SUBCUTANEOUS | Status: DC
  Administered 2017-09-23 – 2017-09-25 (×4): 14 [IU] via SUBCUTANEOUS
  Filled 2017-09-22 (×5): qty 0.14

## 2017-09-22 MED ORDER — ACETAMINOPHEN 500 MG PO TABS: 500.0000 mg | ORAL_TABLET | Freq: Four times a day (QID) | ORAL | Status: DC | PRN

## 2017-09-22 MED ORDER — SODIUM CHLORIDE 0.9 % IV BOLUS (SEPSIS)
250.0000 mL | Freq: Once | INTRAVENOUS | Status: AC
  Administered 2017-09-22: 250 mL via INTRAVENOUS

## 2017-09-22 MED ORDER — PIPERACILLIN-TAZOBACTAM 3.375 G IVPB 30 MIN
3.3750 g | Freq: Once | INTRAVENOUS | Status: AC
  Administered 2017-09-22: 3.375 g via INTRAVENOUS
  Filled 2017-09-22: qty 50

## 2017-09-22 MED ORDER — LORATADINE 10 MG PO TABS
10.0000 mg | ORAL_TABLET | Freq: Every day | ORAL | Status: DC
  Administered 2017-09-23 – 2017-09-26 (×4): 10 mg via ORAL
  Filled 2017-09-22 (×5): qty 1

## 2017-09-22 MED ORDER — SODIUM CHLORIDE 0.9 % IV SOLN
500.0000 mg | Freq: Two times a day (BID) | INTRAVENOUS | Status: DC
  Administered 2017-09-23 – 2017-09-25 (×6): 500 mg via INTRAVENOUS
  Filled 2017-09-22 (×8): qty 0.5

## 2017-09-22 MED ORDER — FERROUS SULFATE 325 (65 FE) MG PO TABS
325.0000 mg | ORAL_TABLET | Freq: Every day | ORAL | Status: DC
  Administered 2017-09-23 – 2017-09-26 (×4): 325 mg via ORAL
  Filled 2017-09-22 (×5): qty 1

## 2017-09-22 MED ORDER — ONDANSETRON HCL 4 MG/2ML IJ SOLN: 4.0000 mg | Freq: Four times a day (QID) | INTRAMUSCULAR | Status: DC | PRN

## 2017-09-22 MED ORDER — ZOLPIDEM TARTRATE 5 MG PO TABS
5.0000 mg | ORAL_TABLET | Freq: Every evening | ORAL | Status: DC | PRN
Start: 2017-09-22 — End: 2017-09-26

## 2017-09-22 MED ORDER — INSULIN ASPART 100 UNIT/ML ~~LOC~~ SOLN
0.0000 [IU] | Freq: Three times a day (TID) | SUBCUTANEOUS | Status: DC
  Administered 2017-09-23 (×2): 1 [IU] via SUBCUTANEOUS
  Administered 2017-09-24: 3 [IU] via SUBCUTANEOUS
  Administered 2017-09-25: 2 [IU] via SUBCUTANEOUS
  Administered 2017-09-25: 5 [IU] via SUBCUTANEOUS
  Administered 2017-09-25: 2 [IU] via SUBCUTANEOUS
  Administered 2017-09-26: 5 [IU] via SUBCUTANEOUS

## 2017-09-22 MED ORDER — SODIUM CHLORIDE 0.9 % IV SOLN
INTRAVENOUS | Status: DC
  Administered 2017-09-23: via INTRAVENOUS

## 2017-09-22 MED ORDER — SODIUM CHLORIDE 0.9 % IV SOLN
1.0000 g | Freq: Once | INTRAVENOUS | Status: AC
  Administered 2017-09-23: 1 g via INTRAVENOUS
  Filled 2017-09-22: qty 1

## 2017-09-22 MED ORDER — SODIUM CHLORIDE 0.9 % IV BOLUS (SEPSIS): 1000.0000 mL | Freq: Once | INTRAVENOUS | Status: DC

## 2017-09-22 MED ORDER — ENOXAPARIN SODIUM 30 MG/0.3ML ~~LOC~~ SOLN
30.0000 mg | Freq: Every day | SUBCUTANEOUS | Status: DC
  Administered 2017-09-23 – 2017-09-26 (×4): 30 mg via SUBCUTANEOUS
  Filled 2017-09-22 (×4): qty 0.3

## 2017-09-22 MED ORDER — SODIUM CHLORIDE 0.9 % IV BOLUS (SEPSIS)
1000.0000 mL | Freq: Once | INTRAVENOUS | Status: AC
  Administered 2017-09-22: 1000 mL via INTRAVENOUS

## 2017-09-22 MED ORDER — DM-GUAIFENESIN ER 30-600 MG PO TB12: 1.0000 | ORAL_TABLET | Freq: Two times a day (BID) | ORAL | Status: DC | PRN

## 2017-09-22 MED ORDER — VITAMIN D3 25 MCG (1000 UNIT) PO TABS
1000.0000 [IU] | ORAL_TABLET | Freq: Every evening | ORAL | Status: DC
  Administered 2017-09-23 – 2017-09-25 (×3): 1000 [IU] via ORAL
  Filled 2017-09-22 (×3): qty 1

## 2017-09-22 MED ORDER — ONDANSETRON HCL 4 MG PO TABS: 4.0000 mg | ORAL_TABLET | Freq: Four times a day (QID) | ORAL | Status: DC | PRN

## 2017-09-22 MED ORDER — HYDRALAZINE HCL 20 MG/ML IJ SOLN: 5.0000 mg | INTRAMUSCULAR | Status: DC | PRN

## 2017-09-22 MED ORDER — VANCOMYCIN HCL IN DEXTROSE 1-5 GM/200ML-% IV SOLN
1000.0000 mg | Freq: Once | INTRAVENOUS | Status: AC
  Administered 2017-09-22: 1000 mg via INTRAVENOUS
  Filled 2017-09-22: qty 200

## 2017-09-22 NOTE — ED Notes (Signed)
Call report to Melissa [phone] at 11:45 pm

## 2017-09-22 NOTE — Progress Notes (Signed)
Pharmacy Antibiotic Note  Javier Murillo is a 83 y.o. male admitted on 09/22/2017 with sepsis.  Pharmacy has been consulted for meropenem dosing.  Plan: Meropenem 1 Gm x1 then 500 mg IV q12h F/u cultures > de-escalate when appropriate F/u scr  Height: 5\' 10"  (177.8 cm) Weight: 160 lb (72.6 kg) IBW/kg (Calculated) : 73  Temp (24hrs), Avg:98.6 F (37 C), Min:97.6 F (36.4 C), Max:99.9 F (37.7 C)   Recent Labs Lab 09/22/17 1822 09/22/17 1842 09/22/17 2156  WBC 15.2*  --   --   CREATININE 3.23*  --   --   LATICACIDVEN  --  1.93* 1.42    Estimated Creatinine Clearance: 17.2 mL/min (A) (by C-G formula based on SCr of 3.23 mg/dL (H)).    Allergies  Allergen Reactions  . Fluvirin [Influenza Vac Split Quad] Other (See Comments)    Gave patient flu    Antimicrobials this admission: 10/17 zosyn >> x1 ED 10/17 vancmycin >> x1 ED 10/17 meropenem >>  Dose adjustments this admission:   Microbiology results:  BCx:   UCx:    Sputum:    MRSA PCR:   Thank you for allowing pharmacy to be a part of this patient's care.  [name], [name] 09/22/2017 10:48 PM

## 2017-09-22 NOTE — ED Triage Notes (Signed)
[facility] Family reports the staff at the facility report that pt has felt weak since Monday and has had pus in his urine. Pt incontinent. Has also had a dry cough at home.

## 2017-09-22 NOTE — ED Notes (Signed)
Gave report to [place]aks[place]issa, RN for assigned room 1427.

## 2017-09-22 NOTE — H&P (Signed)
History and Physical    Javier Murillo ZOX:[id number] DOB: December 23, 1929 DOA: 09/22/2017  Referring MD/NP/PA:   PCP: Juluis Rainier, MD   Patient coming from:  The patient is coming from SNF.  At baseline, pt is dependent for most of ADL.  Chief Complaint: generalized weakness, more confusion, pus in urine  HPI: Javier Murillo is a 83 y.o. male with medical history significant of dementia,quadriplegia, hypertension, hyperlipidemia, diabetes mellitus,depression, PVD, peripheral neuropathy, chronic back pain, iron deficiency anemia, dCHF, CKD-3, who presents with generalized weakness, more confusion and pus in urine.  Patient has dementia and is unable to provide accurate medical history, therefore, most of the history is obtained by discussing the case with ED physician, per EMS report, and with the nursing staff.  Per report, patient has a generalized weakness in the past several days. Patient has been increasingly more confused. He has not been eating and drinking very much. He has dementia at baseline, but staff states that he has been significantly more confused and sleeping more than usual. He was noted to have pus in urine. Patient has had mild dry cough. No active nausea, vomiting, diarrhea noted. Not sure if pt has any chest pain or abdominal pain. He has hx of quadriplegia. No facial droop or slurred speech noted.  ED Course: pt was found to have positive urinalysis with large amount of leukocytes, WBC 15.2, lactic acid 1.93, worsening renal function, Temperature 99.9, tachypnea, OXYGEN saturation 90-97% on room air, blood pressure is soft, negative chest x-ray. Patient is admitted to telemetry bed as inpatient  Review of Systems: could not be reviewed due to dementia and worsening mental status.  Allergy:  Allergies  Allergen Reactions  . Fluvirin [Influenza Vac Split Quad] Other (See Comments)    Gave patient flu    Past Medical History:  Diagnosis Date  . Arthritis   .  Chronic low back pain   . Degenerative arthritis   . Depression   . Diabetes mellitus without complication (HCC)   . Diabetic peripheral neuropathy (HCC)   . Gait disorder   . Hyperlipidemia   . Hypertension   . Kidney stones    only once  . Lumbosacral spondylosis   . Peripheral vascular disease (HCC)   . Quadriplegia and quadriparesis (HCC) 08/20/2014    Past Surgical History:  Procedure Laterality Date  . ANTERIOR CERVICAL DECOMP/DISCECTOMY FUSION N/A 08/22/2014   Procedure: ANTERIOR CERVICAL DECOMPRESSION/DISCECTOMY FUSION, cervical three-four;  Surgeon: Hewitt Shorts, MD;  Location: [facility];  Service: Neurosurgery;  Laterality: N/A;  C3-4 anterior cervical decompression with fusion plating and bonegraft  . APPENDECTOMY    . BACK SURGERY     x 7  . bone spur removed  right sholder    . bursitis Bilateral    olecranon I&D  . CATARACT EXTRACTION W/ INTRAOCULAR LENS  IMPLANT, BILATERAL    . EYE SURGERY     bilateral cataracts  . LUMBAR SPINE SURGERY     x7  . SHOULDER SURGERY Bilateral     Social History:  reports that he quit smoking about 56 years ago. His smoking use included Cigarettes. He has a 1.25 pack-year smoking history. He has never used smokeless tobacco. He reports that he does not drink alcohol or use drugs.  Family History:  Family History  Problem Relation Age of Onset  . Cancer Mother   . Heart attack Father   . Dementia Sister      Prior to Admission medications  Medication Sig Start Date End Date Taking? Authorizing Provider  acetaminophen (TYLENOL) 500 MG tablet Take 1 tablet (500 mg total) by mouth every 6 (six) hours as needed (pain). 10/27/16  Yes Tyrone Nine, MD  amLODipine (NORVASC) 5 MG tablet Take 1 tablet (5 mg total) by mouth daily. 09/17/15  Yes Meredeth Ide, MD  azithromycin (ZITHROMAX) 250 MG tablet Take 500 mg by mouth at bedtime.   Yes [provider]  benzonatate (TESSALON) 100 MG capsule Take 100 mg by mouth  every 8 (eight) hours as needed for cough.   Yes [provider]  cholecalciferol (VITAMIN D) 1000 UNITS tablet Take 1,000 Units by mouth every evening.    Yes [provider]  ferrous sulfate 325 (65 FE) MG tablet Take 325 mg by mouth daily with breakfast.   Yes [provider]  furosemide (LASIX) 20 MG tablet Take 1 tablet (20 mg total) by mouth daily. 09/17/15  Yes Lama, Sarina Ill, MD  HUMALOG 100 UNIT/ML injection Inject 8-11 Units into the skin daily. At 0700 per sliding scale- if BS is 0-200 = 8 units, 201-249 = 9 units, 250+= 11 units 03/26/15  Yes [provider]  insulin lispro (HUMALOG) 100 UNIT/ML injection Inject 10-14 Units into the skin 2 (two) times daily. At 1100 and 1600 per sliding scale - if BS is 0-200 = 10 units, 201-249 = 12 units, 250+ = 14 units   Yes [provider]  lisinopril (PRINIVIL,ZESTRIL) 5 MG tablet Take 5 mg by mouth daily. 06/26/17  Yes [provider]  loratadine (CLARITIN) 10 MG tablet Take 10 mg by mouth daily.   Yes [provider]  TOUJEO SOLOSTAR 300 UNIT/ML SOPN Inject 6-25 Units into the skin 2 (two) times daily. Inject 6 units in the morning at 0700 and inject 27 units at 1700 10/16/16  Yes [provider]    Physical Exam: Vitals:   09/22/17 2235 09/22/17 2309 09/22/17 2339 09/23/17 0027  BP: (!) 107/58 121/69 131/66 (!) 110/59  Pulse: 85 80 80 74  Resp: 20 19 11 16   Temp: 99.1 F (37.3 C)   99.3 F (37.4 C)  TempSrc: Oral   Axillary  SpO2: 99% 100% 94% 96%  Weight:    72.1 kg (158 lb 14.4 oz)  Height:    5\' 10"  (1.778 m)   General: Not in acute distress HEENT:       Eyes: PERRL, EOMI, no scleral icterus.       ENT: No discharge from the ears and nose, no pharynx injection, no tonsillar enlargement.        Neck: No JVD, no bruit, no mass felt. Heme: No neck lymph node enlargement. Cardiac: S1/S2, RRR, No murmurs, No gallops or rubs. Respiratory: No rales, wheezing, rhonchi  or rubs. GI: Soft, nondistended, nontender, no rebound pain, no organomegaly, BS present. GU: No hematuria Ext: No pitting leg edema bilaterally. 2+DP/PT pulse bilaterally. Musculoskeletal: No joint deformities, No joint redness or warmth, no limitation of ROM in spin. Skin: No rashes.  Neuro: confused, not oriented X3, cranial nerves II-XII grossly intact. Has quadriplegia. Psych: Patient is not psychotic.  Labs on Admission: I have personally reviewed following labs and imaging studies  CBC:  Recent Labs Lab 09/22/17 1822  WBC 15.2*  HGB 11.5*  HCT 35.5*  MCV 98.1  PLT 454*   Basic Metabolic Panel:  Recent Labs Lab 09/22/17 1822  NA 145  K 4.3  CL 108  CO2 24  GLUCOSE 122*  BUN 65*  CREATININE 3.23*  CALCIUM 9.4   GFR: Estimated Creatinine Clearance: 17.1 mL/min (A) (by C-G formula based on SCr of 3.23 mg/dL (H)). Liver Function Tests:  Recent Labs Lab 09/22/17 1832  AST 51*  ALT 28  ALKPHOS 68  BILITOT 0.7  PROT 7.6  ALBUMIN 3.4*   No results for input(s): LIPASE, AMYLASE in the last 168 hours. No results for input(s): AMMONIA in the last 168 hours. Coagulation Profile: No results for input(s): INR, PROTIME in the last 168 hours. Cardiac Enzymes: No results for input(s): CKTOTAL, CKMB, CKMBINDEX, TROPONINI in the last 168 hours. BNP (last 3 results) No results for input(s): PROBNP in the last 8760 hours. HbA1C: No results for input(s): HGBA1C in the last 72 hours. CBG:  Recent Labs Lab 09/22/17 1919  GLUCAP 105*   Lipid Profile: No results for input(s): CHOL, HDL, LDLCALC, TRIG, CHOLHDL, LDLDIRECT in the last 72 hours. Thyroid Function Tests: No results for input(s): TSH, T4TOTAL, FREET4, T3FREE, THYROIDAB in the last 72 hours. Anemia Panel: No results for input(s): VITAMINB12, FOLATE, FERRITIN, TIBC, IRON, RETICCTPCT in the last 72 hours. Urine analysis:    Component Value Date/Time   COLORURINE YELLOW 09/22/2017 1914   APPEARANCEUR  TURBID (A) 09/22/2017 1914   LABSPEC 1.013 09/22/2017 1914   PHURINE 5.0 09/22/2017 1914   GLUCOSEU NEGATIVE 09/22/2017 1914   GLUCOSEU >=1000 (A) 06/04/2016 1618   HGBUR MODERATE (A) 09/22/2017 1914   BILIRUBINUR NEGATIVE 09/22/2017 1914   KETONESUR NEGATIVE 09/22/2017 1914   PROTEINUR 100 (A) 09/22/2017 1914   UROBILINOGEN 0.2 06/04/2016 1618   NITRITE NEGATIVE 09/22/2017 1914   LEUKOCYTESUR LARGE (A) 09/22/2017 1914   Sepsis Labs: @LABRCNTIP (procalcitonin:4,lacticidven:4) )No results found for this or any previous visit (from the past 240 hour(s)).   Radiological Exams on Admission: Dg Chest Port 1 View  Result Date: 09/22/2017 CLINICAL DATA:  Shortness of breath EXAM: PORTABLE CHEST 1 VIEW COMPARISON:  Chest radiograph 07/23/2017 FINDINGS: The heart size and mediastinal contours are within normal limits. Both lungs are clear. The visualized skeletal structures are unremarkable. IMPRESSION: No active disease. Electronically Signed   By: Deatra Robinson M.D.   On: 09/22/2017 18:01     EKG: Independently reviewed.  Poor quality of the strip, seems to be sinus rhythm, QTC 367, frequent PVC  Assessment/Plan Principal Problem:   UTI (urinary tract infection) Active Problems:   HTN (hypertension)   Quadriplegia and quadriparesis (HCC)   Anemia   Sepsis (HCC)   Acute renal failure superimposed on stage 3 chronic kidney disease (HCC)   Type II diabetes mellitus with renal manifestations (HCC)   Chronic diastolic CHF (congestive heart failure) (HCC)   Acute metabolic encephalopathy   Sepsis due to UTI (urinary tract infection): pt meets criteria for sepsis with leukocytosis, tachypnea, soft blood pressure. Lactic acid is normal  Currently hemodynamically stable. Patient had positive urine culture with Klebsiella pneumonia on 10/25/16.   - will admit to tele bed as inpt -  Pt received one dose of vancomycin and and Zosyn in ED, will switch to meropenem since patient may have  resistant organism - Follow up results of urine and blood cx and amend antibiotic regimen if needed per sensitivity results - prn Zofran for nausea - will get Procalcitonin and trend lactic acid levels per sepsis protocol. -  IVF: 2.5L of NS bolus in ED, followed by 75 cc/h  HTN: -hold oral Bp med due to soft bp  -IV hydralazine when necessary  Anemia-Iron deficiency; -Continue iron supplement  AoCKD-III: Baseline Cre is  1.8 to 1.9, pt's Cre is 3.23 and BUN 65 on admission. Likely due to prerenal secondary to dehydration and continuation of ACEI, diuretics - IVF as above - Check FeUrea - Follow up renal function by BMP - Hold lisinopril and lasix  Type II diabetes mellitus with renal manifestations: Last A1c 7.2 on 09/06/17, fairly controled. Patient is taking Toujeo at home -will decrease Toujeo dose from 6-27 to 3-14 units bid -SSI  Chronic diastolic CHF (congestive heart failure) (HCC): 2-D echo on 10/25/16 showed EF 60-65% with grade 1 diastolic dysfunction. No leg edema or JVD. CHF is compensated. -hold lasix due to sepsis and worsening renal  -Check BNP  Acute metabolic encephalopathy: most likely due to UTI -Frequent neuro check   DVT ppx:   SQ Lovenox Code Status: Full code Family Communication: None at bed side. Disposition Plan:  Anticipate discharge back to previous SNF Consults called:  none Admission status:  Inpatient/tele     Date of Service 09/23/2017    Lorretta HarpIU, Lothar Prehn [facility] Pager [phone]  If 7PM-7AM, please contact night-coverage [email or web address] Password [email or web address] 09/23/2017, 1:08 AM

## 2017-09-22 NOTE — Progress Notes (Signed)
A consult was received from an ED physician for Vancomycin and Zosyn per pharmacy dosing.  The patient's profile has been reviewed for ht/wt/allergies/indication/available labs.   A one time order has been placed for Vancomycin 1g and Zosyn 3.375g.    Further antibiotics/pharmacy consults should be ordered by admitting physician if indicated.                       Thank you,  Lynann Beaverhristine Tyronne Blann PharmD, BCPS Pager [phone] 09/22/2017 5:43 PM

## 2017-09-22 NOTE — ED Provider Notes (Signed)
[facility]LE[facility] Provider Note   CSN: [id number] Arrival date & time: 09/22/17  1326     History   Chief Complaint Chief Complaint  Patient presents with  . Weakness    HPI Javier Murillo is a 83 y.o. male.  HPI  83 year old male with history of hypertension, hyperlipidemia, dementia, diabetes, who presents with altered mental status. History is provided by the patient's daughter. She notes that over the last several days, the patient has been increasingly more confused. He has not been eating and drinking very much. He has dementia at baseline but staff states that he has been significantly more confused and sleeping more than usual. On my assessment, the patient easily falls asleep and is unable to provide any history. Daughter does state that the patient was seen by his primary physician on Monday. He had a urinalysis ordered but they have been unable to obtain. His urine has seemed darker and possibly purulent according to staff, however. Patient does have a history of UTIs as well as history of sepsis due to aspiration pneumonia. Patient has had a mild cough according to family.  Level 5 caveat invoked as remainder of history, ROS, and physical exam limited due to patient's dementia.    Past Medical History:  Diagnosis Date  . Arthritis   . Chronic low back pain   . Degenerative arthritis   . Depression   . Diabetes mellitus without complication (HCC)   . Diabetic peripheral neuropathy (HCC)   . Gait disorder   . Hyperlipidemia   . Hypertension   . Kidney stones    only once  . Lumbosacral spondylosis   . Peripheral vascular disease (HCC)   . Quadriplegia and quadriparesis (HCC) 08/20/2014    Patient Active Problem List   Diagnosis Date Noted  . Acute metabolic encephalopathy 09/23/2017  . UTI (urinary tract infection) 09/22/2017  . Type II diabetes mellitus with renal manifestations (HCC) 09/22/2017  . Chronic diastolic  CHF (congestive heart failure) (HCC) 09/22/2017  . Severe protein-calorie malnutrition (HCC) 07/24/2017  . Dementia 07/23/2017  . Hypernatremia 07/23/2017  . Acute renal failure superimposed on stage 3 chronic kidney disease (HCC) 07/23/2017  . Toxic encephalopathy 07/23/2017  . Sepsis (HCC) 07/22/2017  . Aspiration pneumonia of both lower lobes due to gastric secretions (HCC) 07/22/2017  . UTI due to Klebsiella species   . Rhabdomyolysis 10/25/2016  . Elevated troponin 10/24/2016  . Acute encephalopathy 09/15/2015  . Acute respiratory failure with hypoxia (HCC) 09/15/2015  . Bradycardia 09/15/2015  . Diabetes mellitus with neuropathy (HCC) 09/15/2015  . Anemia 01/30/2015  . Quadriplegia and quadriparesis (HCC) 08/20/2014  . Gait disorder 08/20/2014  . Weakness 07/23/2014  . Diabetic neuropathy (HCC) 07/23/2014  . Acute renal failure (HCC) 07/23/2014  . HTN (hypertension) 07/23/2014  . Dyslipidemia 07/23/2014    Past Surgical History:  Procedure Laterality Date  . ANTERIOR CERVICAL DECOMP/DISCECTOMY FUSION N/A 08/22/2014   Procedure: ANTERIOR CERVICAL DECOMPRESSION/DISCECTOMY FUSION, cervical three-four;  Surgeon: Hewitt Shorts, MD;  Location: [facility];  Service: Neurosurgery;  Laterality: N/A;  C3-4 anterior cervical decompression with fusion plating and bonegraft  . APPENDECTOMY    . BACK SURGERY     x 7  . bone spur removed  right sholder    . bursitis Bilateral    olecranon I&D  . CATARACT EXTRACTION W/ INTRAOCULAR LENS  IMPLANT, BILATERAL    . EYE SURGERY     bilateral cataracts  . LUMBAR SPINE SURGERY  x7  . SHOULDER SURGERY Bilateral        Home Medications    Prior to Admission medications   Medication Sig Start Date End Date Taking? Authorizing Provider  acetaminophen (TYLENOL) 500 MG tablet Take 1 tablet (500 mg total) by mouth every 6 (six) hours as needed (pain). 10/27/16  Yes Tyrone Nine, MD  amLODipine (NORVASC) 5 MG tablet Take 1 tablet (5  mg total) by mouth daily. 09/17/15  Yes Meredeth Ide, MD  azithromycin (ZITHROMAX) 250 MG tablet Take 500 mg by mouth at bedtime.   Yes [provider]  benzonatate (TESSALON) 100 MG capsule Take 100 mg by mouth every 8 (eight) hours as needed for cough.   Yes [provider]  cholecalciferol (VITAMIN D) 1000 UNITS tablet Take 1,000 Units by mouth every evening.    Yes [provider]  ferrous sulfate 325 (65 FE) MG tablet Take 325 mg by mouth daily with breakfast.   Yes [provider]  furosemide (LASIX) 20 MG tablet Take 1 tablet (20 mg total) by mouth daily. 09/17/15  Yes Lama, Sarina Ill, MD  HUMALOG 100 UNIT/ML injection Inject 8-11 Units into the skin daily. At 0700 per sliding scale- if BS is 0-200 = 8 units, 201-249 = 9 units, 250+= 11 units 03/26/15  Yes [provider]  insulin lispro (HUMALOG) 100 UNIT/ML injection Inject 10-14 Units into the skin 2 (two) times daily. At 1100 and 1600 per sliding scale - if BS is 0-200 = 10 units, 201-249 = 12 units, 250+ = 14 units   Yes [provider]  lisinopril (PRINIVIL,ZESTRIL) 5 MG tablet Take 5 mg by mouth daily. 06/26/17  Yes [provider]  loratadine (CLARITIN) 10 MG tablet Take 10 mg by mouth daily.   Yes [provider]  TOUJEO SOLOSTAR 300 UNIT/ML SOPN Inject 6-25 Units into the skin 2 (two) times daily. Inject 6 units in the morning at 0700 and inject 27 units at 1700 10/16/16  Yes [provider]    Family History Family History  Problem Relation Age of Onset  . Cancer Mother   . Heart attack Father   . Dementia Sister     Social History Social History  Substance Use Topics  . Smoking status: Former Smoker    Packs/day: 0.25    Years: 5.00    Types: Cigarettes    Quit date: 07/22/1961  . Smokeless tobacco: Never Used     Comment: less than 1/4 pack per week  . Alcohol use No     Allergies   Fluvirin [influenza vac split quad]   Review of  Systems Review of Systems  Unable to perform ROS: Dementia     Physical Exam Updated Vital Signs BP 118/60 (BP Location: Right Arm)   Pulse 76   Temp 99 F (37.2 C) (Oral)   Resp 18   Ht 5\' 10"  (1.778 m)   Wt 72.1 kg (158 lb 14.4 oz)   SpO2 98%   BMI 22.80 kg/m   Physical Exam  Constitutional: He is oriented to person, place, and time. He appears well-developed and well-nourished. No distress.  HENT:  Head: Normocephalic and atraumatic.  Dry mucous membranes  Eyes: Conjunctivae are normal.  Neck: Neck supple.  Cardiovascular: Normal rate, regular rhythm and normal heart sounds.  Exam reveals no friction rub.   No murmur heard. Pulmonary/Chest: Effort normal and breath sounds normal. Tachypnea noted. No respiratory distress. He has no wheezes. He has  no rales.  Abdominal: He exhibits no distension.  Musculoskeletal: He exhibits no edema.  Neurological: He is alert and oriented to person, place, and time. He exhibits normal muscle tone.  Skin: Skin is warm. Capillary refill takes less than 2 seconds.  Psychiatric: He has a normal mood and affect.  Nursing note and vitals reviewed.    ED Treatments / Results  Labs (all labs ordered are listed, but only abnormal results are displayed) Labs Reviewed  BASIC METABOLIC PANEL - Abnormal; Notable for the following:       Result Value   Glucose, Bld 122 (*)    BUN 65 (*)    Creatinine, Ser 3.23 (*)    GFR calc non Af Amer 16 (*)    GFR calc Af Amer 19 (*)    All other components within normal limits  CBC - Abnormal; Notable for the following:    WBC 15.2 (*)    RBC 3.62 (*)    Hemoglobin 11.5 (*)    HCT 35.5 (*)    Platelets 454 (*)    All other components within normal limits  URINALYSIS, ROUTINE W REFLEX MICROSCOPIC - Abnormal; Notable for the following:    APPearance TURBID (*)    Hgb urine dipstick MODERATE (*)    Protein, ur 100 (*)    Leukocytes, UA LARGE (*)    Bacteria, UA MANY (*)    All other components  within normal limits  HEPATIC FUNCTION PANEL - Abnormal; Notable for the following:    Albumin 3.4 (*)    AST 51 (*)    All other components within normal limits  BRAIN NATRIURETIC PEPTIDE - Abnormal; Notable for the following:    B Natriuretic Peptide 300.8 (*)    All other components within normal limits  BASIC METABOLIC PANEL - Abnormal; Notable for the following:    Sodium 146 (*)    Chloride 114 (*)    Glucose, Bld 159 (*)    BUN 59 (*)    Creatinine, Ser 2.73 (*)    Calcium 8.6 (*)    GFR calc non Af Amer 20 (*)    GFR calc Af Amer 23 (*)    All other components within normal limits  CBC - Abnormal; Notable for the following:    WBC 13.1 (*)    RBC 3.12 (*)    Hemoglobin 9.8 (*)    HCT 30.5 (*)    All other components within normal limits  GLUCOSE, CAPILLARY - Abnormal; Notable for the following:    Glucose-Capillary 157 (*)    All other components within normal limits  GLUCOSE, CAPILLARY - Abnormal; Notable for the following:    Glucose-Capillary 137 (*)    All other components within normal limits  CBG MONITORING, ED - Abnormal; Notable for the following:    Glucose-Capillary 105 (*)    All other components within normal limits  I-STAT CG4 LACTIC ACID, ED - Abnormal; Notable for the following:    Lactic Acid, Venous 1.93 (*)    All other components within normal limits  MRSA PCR SCREENING  CULTURE, BLOOD (ROUTINE X 2)  CULTURE, BLOOD (ROUTINE X 2)  URINE CULTURE  CREATININE, URINE, RANDOM  UREA NITROGEN, URINE  I-STAT CG4 LACTIC ACID, ED  I-STAT CG4 LACTIC ACID, ED    EKG  EKG Interpretation  Date/Time:  Wednesday September 22 2017 17:00:47 EDT Ventricular Rate:  94 PR Interval:    QRS Duration: 91 QT Interval:  293 QTC Calculation: 367  R Axis:   -17 Text Interpretation:  Age not entered, assumed to be  83 years old for purpose of ECG interpretation Sinus rhythm Atrial premature complexes Abnormal R-wave progression, early transition No significant change  since last tracing Non-specific ST-t changes, worse laterally Confirmed by Shaune Pollack [phone]) on 09/22/2017 6:02:55 PM Also confirmed by Shaune Pollack [phone]), editor Elita Quick [phone])  on 09/23/2017 8:17:21 AM       Radiology Dg Chest Port 1 View  Result Date: 09/22/2017 CLINICAL DATA:  Shortness of breath EXAM: PORTABLE CHEST 1 VIEW COMPARISON:  Chest radiograph 07/23/2017 FINDINGS: The heart size and mediastinal contours are within normal limits. Both lungs are clear. The visualized skeletal structures are unremarkable. IMPRESSION: No active disease. Electronically Signed   By: Deatra Robinson M.D.   On: 09/22/2017 18:01    Procedures .Critical Care Performed by: Shaune Pollack Authorized by: Shaune Pollack   Critical care provider statement:    Critical care time (minutes):  35   Critical care time was exclusive of:  Separately billable procedures and treating other patients and teaching time   Critical care was necessary to treat or prevent imminent or life-threatening deterioration of the following conditions:  Circulatory failure, sepsis and renal failure   Critical care was time spent personally by me on the following activities:  Development of treatment plan with patient or surrogate, discussions with consultants, evaluation of patient's response to treatment, examination of patient, obtaining history from patient or surrogate, ordering and performing treatments and interventions, ordering and review of laboratory studies, ordering and review of radiographic studies, pulse oximetry, re-evaluation of patient's condition and review of old charts   I assumed direction of critical care for this patient from another provider in my specialty: no     (including critical care time)  Medications Ordered in ED Medications  loratadine (CLARITIN) tablet 10 mg (not administered)  acetaminophen (TYLENOL) tablet 500 mg (not administered)  insulin glargine (LANTUS) injection  14 Units (14 Units Subcutaneous Given 09/23/17 0110)  cholecalciferol (VITAMIN D) tablet 1,000 Units (1,000 Units Oral Not Given 09/23/17 0031)  ferrous sulfate tablet 325 mg (not administered)  0.9 %  sodium chloride infusion ( Intravenous New Bag/Given 09/23/17 0025)  enoxaparin (LOVENOX) injection 30 mg (not administered)  ondansetron (ZOFRAN) tablet 4 mg (not administered)    Or  ondansetron (ZOFRAN) injection 4 mg (not administered)  hydrALAZINE (APRESOLINE) injection 5 mg (not administered)  zolpidem (AMBIEN) tablet 5 mg (not administered)  dextromethorphan-guaiFENesin (MUCINEX DM) 30-600 MG per 12 hr tablet 1 tablet (not administered)  insulin aspart (novoLOG) injection 0-9 Units (not administered)  meropenem (MERREM) 500 mg in sodium chloride 0.9 % 50 mL IVPB (not administered)  insulin glargine (LANTUS) injection 3 Units (not administered)  sodium chloride 0.9 % bolus 1,000 mL (0 mLs Intravenous Stopped 09/22/17 2039)    And  sodium chloride 0.9 % bolus 1,000 mL (0 mLs Intravenous Stopped 09/22/17 1920)    And  sodium chloride 0.9 % bolus 250 mL (0 mLs Intravenous Stopped 09/22/17 1856)  piperacillin-tazobactam (ZOSYN) IVPB 3.375 g (0 g Intravenous Stopped 09/22/17 1906)  vancomycin (VANCOCIN) IVPB 1000 mg/200 mL premix (0 mg Intravenous Stopped 09/22/17 2038)  sodium chloride 0.9 % bolus 250 mL (0 mLs Intravenous Stopped 09/22/17 2350)  meropenem (MERREM) 1 g in sodium chloride 0.9 % 100 mL IVPB (0 g Intravenous Stopped 09/23/17 0055)     Initial Impression / Assessment and Plan / ED Course  I have reviewed the triage vital  signs and the nursing notes.  Pertinent labs & imaging results that were available during my care of the patient were reviewed by me and considered in my medical decision making (see chart for details).     83 year old male here with fever, worsening confusion, and decreased by mouth intake. Pt mildly hypotensive on arrival. Lab work shows significant  leukocytosis, acute kidney injury, and pyuria consistent with severe sepsis secondary to UTI. Code sepsis initiated and patient given initial broad-spectrum antibiotics and aggressive fluid resuscitation Chest x-ray is without evidence of pneumonia, though while watching in ED, he is noted to have frequent sleep apnea and occasional coughing concerning for microaspiration. Will continue broad-spectrum and admit to hospitalist service. Daughter updated. Pt is FULL CODE.   Final Clinical Impressions(s) / ED Diagnoses   Final diagnoses:  Sepsis due to urinary tract infection (HCC)  AKI (acute kidney injury) (HCC)  Encephalopathy    New Prescriptions Current Discharge Medication List       Shaune Pollack, MD 09/23/17 1026

## 2017-09-23 DIAGNOSIS — G9341 Metabolic encephalopathy: Secondary | ICD-10-CM | POA: Diagnosis present

## 2017-09-23 LAB — CBC
HEMATOCRIT: 30.5 % — AB (ref 39.0–52.0)
Hemoglobin: 9.8 g/dL — ABNORMAL LOW (ref 13.0–17.0)
MCH: 31.4 pg (ref 26.0–34.0)
MCHC: 32.1 g/dL (ref 30.0–36.0)
MCV: 97.8 fL (ref 78.0–100.0)
PLATELETS: 374 10*3/uL (ref 150–400)
RBC: 3.12 MIL/uL — AB (ref 4.22–5.81)
RDW: 13.5 % (ref 11.5–15.5)
WBC: 13.1 10*3/uL — AB (ref 4.0–10.5)

## 2017-09-23 LAB — BLOOD CULTURE ID PANEL (REFLEXED)
Acinetobacter baumannii: NOT DETECTED
CANDIDA KRUSEI: NOT DETECTED
CANDIDA PARAPSILOSIS: NOT DETECTED
CANDIDA TROPICALIS: NOT DETECTED
Candida albicans: NOT DETECTED
Candida glabrata: NOT DETECTED
ESCHERICHIA COLI: NOT DETECTED
Enterobacter cloacae complex: NOT DETECTED
Enterobacteriaceae species: NOT DETECTED
Enterococcus species: NOT DETECTED
HAEMOPHILUS INFLUENZAE: NOT DETECTED
KLEBSIELLA OXYTOCA: NOT DETECTED
KLEBSIELLA PNEUMONIAE: NOT DETECTED
Listeria monocytogenes: NOT DETECTED
Methicillin resistance: DETECTED — AB
Neisseria meningitidis: NOT DETECTED
PROTEUS SPECIES: NOT DETECTED
PSEUDOMONAS AERUGINOSA: NOT DETECTED
SERRATIA MARCESCENS: NOT DETECTED
STAPHYLOCOCCUS AUREUS BCID: NOT DETECTED
STAPHYLOCOCCUS SPECIES: DETECTED — AB
STREPTOCOCCUS PNEUMONIAE: NOT DETECTED
Streptococcus agalactiae: NOT DETECTED
Streptococcus pyogenes: NOT DETECTED
Streptococcus species: NOT DETECTED

## 2017-09-23 LAB — GLUCOSE, CAPILLARY
GLUCOSE-CAPILLARY: 137 mg/dL — AB (ref 65–99)
GLUCOSE-CAPILLARY: 155 mg/dL — AB (ref 65–99)
GLUCOSE-CAPILLARY: 157 mg/dL — AB (ref 65–99)
Glucose-Capillary: 141 mg/dL — ABNORMAL HIGH (ref 65–99)
Glucose-Capillary: 103 mg/dL — ABNORMAL HIGH (ref 65–99)

## 2017-09-23 LAB — BASIC METABOLIC PANEL
ANION GAP: 9 (ref 5–15)
BUN: 59 mg/dL — ABNORMAL HIGH (ref 6–20)
CHLORIDE: 114 mmol/L — AB (ref 101–111)
CO2: 23 mmol/L (ref 22–32)
Calcium: 8.6 mg/dL — ABNORMAL LOW (ref 8.9–10.3)
Creatinine, Ser: 2.73 mg/dL — ABNORMAL HIGH (ref 0.61–1.24)
GFR, EST AFRICAN AMERICAN: 23 mL/min — AB (ref >60)
GFR, EST NON AFRICAN AMERICAN: 20 mL/min — AB (ref >60)
Glucose, Bld: 159 mg/dL — ABNORMAL HIGH (ref 65–99)
POTASSIUM: 4.2 mmol/L (ref 3.5–5.1)
SODIUM: 146 mmol/L — AB (ref 135–145)

## 2017-09-23 LAB — MRSA PCR SCREENING: MRSA by PCR: NEGATIVE

## 2017-09-23 LAB — CREATININE, URINE, RANDOM: Creatinine, Urine: 106.36 mg/dL

## 2017-09-23 LAB — BRAIN NATRIURETIC PEPTIDE: B NATRIURETIC PEPTIDE 5: 300.8 pg/mL — AB (ref 0.0–100.0)

## 2017-09-23 MED ORDER — INSULIN GLARGINE 100 UNIT/ML ~~LOC~~ SOLN
3.0000 [IU] | Freq: Every day | SUBCUTANEOUS | Status: DC
  Administered 2017-09-23 – 2017-09-26 (×4): 3 [IU] via SUBCUTANEOUS
  Filled 2017-09-23 (×4): qty 0.03

## 2017-09-23 MED ORDER — GABAPENTIN 100 MG PO CAPS
100.0000 mg | ORAL_CAPSULE | Freq: Two times a day (BID) | ORAL | Status: DC
  Administered 2017-09-23: 100 mg via ORAL
  Filled 2017-09-23: qty 1

## 2017-09-23 MED ORDER — VANCOMYCIN HCL IN DEXTROSE 750-5 MG/150ML-% IV SOLN
750.0000 mg | Freq: Once | INTRAVENOUS | Status: AC
  Administered 2017-09-23: 750 mg via INTRAVENOUS
  Filled 2017-09-23: qty 150

## 2017-09-23 MED ORDER — GLUCERNA SHAKE PO LIQD
237.0000 mL | Freq: Three times a day (TID) | ORAL | Status: DC
  Administered 2017-09-23 – 2017-09-26 (×7): 237 mL via ORAL
  Filled 2017-09-23 (×10): qty 237

## 2017-09-23 NOTE — Evaluation (Signed)
Clinical/Bedside Swallow Evaluation Patient Details  Name: Javier Murillo MRN: [id number] Date of Birth: 05/12/1930  Today's Date: 09/23/2017 Time: SLP Start Time (ACUTE ONLY): 1215 SLP Stop Time (ACUTE ONLY): 1250 SLP Time Calculation (min) (ACUTE ONLY): 35 min  Past Medical History:  Past Medical History:  Diagnosis Date  . Arthritis   . Chronic low back pain   . Degenerative arthritis   . Depression   . Diabetes mellitus without complication (HCC)   . Diabetic peripheral neuropathy (HCC)   . Gait disorder   . Hyperlipidemia   . Hypertension   . Kidney stones    only once  . Lumbosacral spondylosis   . Peripheral vascular disease (HCC)   . Quadriplegia and quadriparesis (HCC) 08/20/2014   Past Surgical History:  Past Surgical History:  Procedure Laterality Date  . ANTERIOR CERVICAL DECOMP/DISCECTOMY FUSION N/A 08/22/2014   Procedure: ANTERIOR CERVICAL DECOMPRESSION/DISCECTOMY FUSION, cervical three-four;  Surgeon: Hewitt Shorts, MD;  Location: [facility];  Service: Neurosurgery;  Laterality: N/A;  C3-4 anterior cervical decompression with fusion plating and bonegraft  . APPENDECTOMY    . BACK SURGERY     x 7  . bone spur removed  right sholder    . bursitis Bilateral    olecranon I&D  . CATARACT EXTRACTION W/ INTRAOCULAR LENS  IMPLANT, BILATERAL    . EYE SURGERY     bilateral cataracts  . LUMBAR SPINE SURGERY     x7  . SHOULDER SURGERY Bilateral    HPI:  83 yo male adm to [facility] with confusion, decreased po intake, found to be septic due to UTI.  PMH + for dementia, quadriparesis, DM, PVD, peripheral neuropathy, ACDF 08/22/14 C3-C4, on lasix, former smoker.  WBC increased leukyocytosis, CXR negative.  Pt have prior MBS showed no aspiraiton of puree/solids, penetration with thin and trace aspiration with premature spillage, Pt was placed on dys3/thin diet - small boluses.     Assessment / Plan / Recommendation Clinical Impression  Pt presents with clinical signs of  oral dysphagia likely cognitive based resulting in oral holding, delayed transiting suspected.  Pt does have strong cough with clearance of viscous secretions and per daughter, he was on ABX for potential respiratory issue over one week.  NO overt indication of aspiration noted but review of prior MBS showed delayed in oral transiting and penetration.  Current CXR negative.  Pt benefits from cues to swallow - due to oral holding/swishing.  Recommend dys3/thin.   SLP Visit Diagnosis: Dysphagia, oral phase (R13.11)    Aspiration Risk  Moderate aspiration risk    Diet Recommendation Dysphagia 3 (Mech soft);Thin liquid   Liquid Administration via: Cup;No straw Medication Administration: Whole meds with puree Supervision: Intermittent supervision to cue for compensatory strategies;Staff to assist with self feeding Compensations: Slow rate;Small sips/bites;Minimize environmental distractions (cues to swallow) Postural Changes: Seated upright at 90 degrees;Remain upright for at least 30 minutes after po intake    Other  Recommendations Oral Care Recommendations: Oral care BID   Follow up Recommendations        Frequency and Duration min 1 x/week  1 week       Prognosis Prognosis for Safe Diet Advancement: Fair Barriers to Reach Goals: Cognitive deficits;Time post onset      Swallow Study   General Date of Onset: 09/23/17 HPI: 83 yo male adm to [facility] with confusion, decreased po intake, found to be septic due to UTI.  PMH + for dementia, quadriparesis, DM, PVD, peripheral neuropathy,  ACDF 08/22/14 C3-C4, on lasix, former smoker.  WBC increased leukyocytosis, CXR negative.  Pt have prior MBS showed no aspiraiton of puree/solids, penetration with thin and trace aspiration with premature spillage, Pt was placed on dys3/thin diet - small boluses.   Type of Study: Bedside Swallow Evaluation Previous Swallow Assessment: see HPI Diet Prior to this Study: NPO Temperature Spikes Noted: No Respiratory  Status: Room air History of Recent Intubation: No Behavior/Cognition: Alert;Cooperative;Requires cueing;Confused (HOH and has dementia) Oral Cavity Assessment: Dried secretions Oral Care Completed by SLP: No Oral Cavity - Dentition: Adequate natural dentition Vision: Functional for self-feeding Self-Feeding Abilities: Able to feed self Patient Positioning: Upright in bed Baseline Vocal Quality: Low vocal intensity Volitional Cough: Strong (productive to green secretions) Volitional Swallow: Unable to elicit    Oral/Motor/Sensory Function Overall Oral Motor/Sensory Function: Generalized oral weakness (? mild facial asymmetry but no overt CN deficits apparent)   Ice Chips Ice chips: Impaired Presentation: Self Fed;Spoon Oral Phase Functional Implications: Prolonged oral transit;Oral holding   Thin Liquid Thin Liquid: Impaired Presentation: Cup;Self Fed;Spoon Oral Phase Functional Implications: Oral holding;Prolonged oral transit    Nectar Thick Nectar Thick Liquid: Impaired Presentation: Cup;Self Fed Oral phase functional implications: Oral holding   Honey Thick     Puree Puree: Impaired Presentation: Self Fed Oral Phase Functional Implications: Oral holding Other Comments: jello   Solid   GO   Solid: Impaired Presentation: Self Fed Oral Phase Impairments: Impaired mastication Oral Phase Functional Implications: Oral holding        Chales AbrahamsKimball, Maleta Pacha Ann 09/23/2017,12:58 PM  Donavan Burnetamara Rondale Nies, MS [facility] SLP [phone]

## 2017-09-23 NOTE — Progress Notes (Signed)
PROGRESS NOTE    Javier Murillo  ZDG:[id number] DOB: 10-05-1930 DOA: 09/22/2017 PCP: Juluis Rainier, MD    Brief Narrative:   Javier Murillo is a 83 y.o. male with medical history significant of dementia,quadriplegia, hypertension, hyperlipidemia, diabetes mellitus,depression, PVD, peripheral neuropathy, chronic back pain, iron deficiency anemia, dCHF, CKD-3, who presents with generalized weakness, more confusion and pus in urine. Patient has dementia and is unable to provide accurate medical history, therefore, most of the history is obtained by discussing the case with ED physician, per EMS report, and with the nursing staff.  Spoke with daughter in the room this morning who is the primary historian. Explained to her that we are currently working on obtaining cultures and that we will have to do a speech evaluation as well as physical therapy and occupational therapy evaluation prior to initiating a diet. No acute events since admission.  Assessment & Plan:   Principal Problem:   UTI (urinary tract infection) Active Problems:   HTN (hypertension)   Quadriplegia and quadriparesis (HCC)   Anemia   Sepsis (HCC)   Acute renal failure superimposed on stage 3 chronic kidney disease (HCC)   Type II diabetes mellitus with renal manifestations (HCC)   Chronic diastolic CHF (congestive heart failure) (HCC)   Acute metabolic encephalopathy   Sepsis due to UTI (urinary tract infection): pt meets criteria for sepsis with leukocytosis, tachypnea, soft blood pressure. Lactic acid is normal  Currently hemodynamically stable. Patient had positive urine culture with Klebsiella pneumonia on 10/25/16.   -Continue IV Merrem and await urine and sputum cultures -CXR negative  HTN: -hold oral Bp med due to soft bp  -IV hydralazine when necessary  Anemia-Iron deficiency; -Continue iron supplement  AoCKD-III: Baseline Cre is  1.8 to 1.9, pt's Cre is 3.23->2.73 and BUN 6->59 on admission.  Likely due to prerenal secondary to dehydration and continuation of ACEI, diuretics - IVF as above - Check FeUrea - Follow up renal function by BMP - Hold lisinopril and lasix  Type II diabetes mellitus with renal manifestations: Last A1c 7.2 on 09/06/17, fairly controled. Patient is taking Toujeo at home -will decrease Toujeo dose from 6-27 to 3-14 units bid -SSI -BG controlled  Chronic diastolic CHF (congestive heart failure) (HCC): 2-D echo on 10/25/16 showed EF 60-65% with grade 1 diastolic dysfunction. No leg edema or JVD. CHF is compensated. -hold lasix due to sepsis and worsening renal  -BNP 300; no active CHF noted  Acute metabolic encephalopathy: most likely due to UTI -Frequent neuro checks ongoing   DVT ppx:   SQ Lovenox Code Status: Full code Family Communication: None at bed side. Disposition Plan:  Anticipate discharge back to previous SNF Consults called:  none Admission status:  Inpatient/tele    Consultants:  None   Procedures: None   Antimicrobials: Meropenem 09/22/2017->   Objective: Vitals:   09/22/17 2309 09/22/17 2339 09/23/17 0027 09/23/17 0534  BP: 121/69 131/66 (!) 110/59 118/60  Pulse: 80 80 74 76  Resp: 19 11 16 18   Temp:   99.3 F (37.4 C) 99 F (37.2 C)  TempSrc:   Axillary Oral  SpO2: 100% 94% 96% 98%  Weight:   72.1 kg (158 lb 14.4 oz)   Height:   5\' 10"  (1.778 m)     Intake/Output Summary (Last 24 hours) at 09/23/17 1147 Last data filed at 09/23/17 0600  Gross per 24 hour  Intake           728.75 ml  Output  900 ml  Net          -171.25 ml   Filed Weights   09/22/17 1843 09/23/17 0027  Weight: 72.6 kg (160 lb) 72.1 kg (158 lb 14.4 oz)    Examination:  General exam: Appears calm and comfortable ; mimimally responsive Respiratory system: Clear to auscultation. Respiratory effort normal. Cardiovascular system: S1 & S2 heard, RRR. No JVD, murmurs, rubs, gallops or clicks. No pedal edema. Gastrointestinal  system: Abdomen is nondistended, soft and nontender. No organomegaly or masses felt. Normal bowel sounds heard. Central nervous system: Alert and oriented. No focal neurological deficits. Extremities: Symmetric 5 x 5 power. Skin: No rashes, lesions or ulcers Psychiatry: Cannot adequately assess due to severe dementia   Data Reviewed: I have personally reviewed following labs and imaging studies  CBC:  Recent Labs Lab 09/22/17 1822 09/23/17 0057  WBC 15.2* 13.1*  HGB 11.5* 9.8*  HCT 35.5* 30.5*  MCV 98.1 97.8  PLT 454* 374   Basic Metabolic Panel:  Recent Labs Lab 09/22/17 1822 09/23/17 0057  NA 145 146*  K 4.3 4.2  CL 108 114*  CO2 24 23  GLUCOSE 122* 159*  BUN 65* 59*  CREATININE 3.23* 2.73*  CALCIUM 9.4 8.6*   GFR: Estimated Creatinine Clearance: 20.2 mL/min (A) (by C-G formula based on SCr of 2.73 mg/dL (H)). Liver Function Tests:  Recent Labs Lab 09/22/17 1832  AST 51*  ALT 28  ALKPHOS 68  BILITOT 0.7  PROT 7.6  ALBUMIN 3.4*   No results for input(s): LIPASE, AMYLASE in the last 168 hours. No results for input(s): AMMONIA in the last 168 hours. Coagulation Profile: No results for input(s): INR, PROTIME in the last 168 hours. Cardiac Enzymes: No results for input(s): CKTOTAL, CKMB, CKMBINDEX, TROPONINI in the last 168 hours. BNP (last 3 results) No results for input(s): PROBNP in the last 8760 hours. HbA1C: No results for input(s): HGBA1C in the last 72 hours. CBG:  Recent Labs Lab 09/22/17 1919 09/23/17 0024 09/23/17 0820  GLUCAP 105* 157* 137*   Lipid Profile: No results for input(s): CHOL, HDL, LDLCALC, TRIG, CHOLHDL, LDLDIRECT in the last 72 hours. Thyroid Function Tests: No results for input(s): TSH, T4TOTAL, FREET4, T3FREE, THYROIDAB in the last 72 hours. Anemia Panel: No results for input(s): VITAMINB12, FOLATE, FERRITIN, TIBC, IRON, RETICCTPCT in the last 72 hours. Sepsis Labs:  Recent Labs Lab 09/22/17 1842 09/22/17 2156    LATICACIDVEN 1.93* 1.42    Recent Results (from the past 240 hour(s))  MRSA PCR Screening     Status: None   Collection Time: 09/23/17  1:18 AM  Result Value Ref Range Status   MRSA by PCR NEGATIVE NEGATIVE Final    Comment:        The GeneXpert MRSA Assay (FDA approved for NASAL specimens only), is one component of a comprehensive MRSA colonization surveillance program. It is not intended to diagnose MRSA infection nor to guide or monitor treatment for MRSA infections.       Radiology Studies: Dg Chest Port 1 View  Result Date: 09/22/2017 CLINICAL DATA:  Shortness of breath EXAM: PORTABLE CHEST 1 VIEW COMPARISON:  Chest radiograph 07/23/2017 FINDINGS: The heart size and mediastinal contours are within normal limits. Both lungs are clear. The visualized skeletal structures are unremarkable. IMPRESSION: No active disease. Electronically Signed   By: Deatra RobinsonKevin  Herman M.D.   On: 09/22/2017 18:01       Scheduled Meds: . cholecalciferol  1,000 Units Oral QPM  . enoxaparin (LOVENOX)  injection  30 mg Subcutaneous Daily  . ferrous sulfate  325 mg Oral Q breakfast  . insulin aspart  0-9 Units Subcutaneous TID WC  . insulin glargine  14 Units Subcutaneous QHS  . insulin glargine  3 Units Subcutaneous Daily  . loratadine  10 mg Oral Daily   Continuous Infusions: . sodium chloride 75 mL/hr at 09/23/17 0025  . meropenem (MERREM) IV       LOS: 1 day    Time spent: 30 minutes    Chason Mciver Hoover Brunette, DO [facility] Pager [phone]  If 7PM-7AM, please contact night-coverage [email or web address] Password [email or web address] 09/23/2017, 11:47 AM

## 2017-09-23 NOTE — Progress Notes (Signed)
Pharmacy Antibiotic Note  Jule SerRobert L Daughety is a 83 y.o. male admitted on 09/22/2017 with sepsis.  Pharmacy has been consulted for meropenem dosing.  Blood cultures have returned with GPC 2/2 with BCID identifying as methicillin resistant coag neg staphylocci.  Will add vancomycin   Today, 09/23/2017  Renal: AKI on CKD - note with quadriplegia and dec muscle mass, SCr will not accurately predict renal function.  CrCl likely less than what is calculated  Patient given vancomycin 1gm x1 10/17 at 19:00  Plan:  Continue meropenem 500 mg IV q12h  Vancomycin 750mg  IV x 1 then plan for random vancomycin level 10/20am  F/u cultures > de-escalate when appropriate  F/u scr  Height: 5\' 10"  (177.8 cm) Weight: 158 lb 14.4 oz (72.1 kg) IBW/kg (Calculated) : 73  Temp (24hrs), Avg:99 F (37.2 C), Min:97.9 F (36.6 C), Max:99.9 F (37.7 C)   Recent Labs Lab 09/22/17 1822 09/22/17 1842 09/22/17 2156 09/23/17 0057  WBC 15.2*  --   --  13.1*  CREATININE 3.23*  --   --  2.73*  LATICACIDVEN  --  1.93* 1.42  --     Estimated Creatinine Clearance: 20.2 mL/min (A) (by C-G formula based on SCr of 2.73 mg/dL (H)).    Allergies  Allergen Reactions  . Fluvirin [Influenza Vac Split Quad] Other (See Comments)    Gave patient flu    Antimicrobials this admission: 10/17 zosyn >> x1 ED 10/17 vancmycin >> x1 ED 10/17 meropenem >>  Dose adjustments this admission:   Microbiology results:  BCx: 2/2 GPC (BCID = MR-ConS)  UCx:    Sputum:    MRSA PCR: neg  Thank you for allowing pharmacy to be a part of this patient's care.  Juliette Alcideustin Zeigler, PharmD, BCPS.   Pager: [phone] 09/23/2017 5:52 PM

## 2017-09-23 NOTE — Progress Notes (Signed)
PHARMACY - PHYSICIAN COMMUNICATION CRITICAL VALUE ALERT - BLOOD CULTURE IDENTIFICATION (BCID)  Results for orders placed or performed during the hospital encounter of 09/22/17  Blood Culture ID Panel (Reflexed) (Collected: 09/22/2017  6:22 PM)  Result Value Ref Range   Enterococcus species NOT DETECTED NOT DETECTED   Listeria monocytogenes NOT DETECTED NOT DETECTED   Staphylococcus species DETECTED (A) NOT DETECTED   Staphylococcus aureus NOT DETECTED NOT DETECTED   Methicillin resistance DETECTED (A) NOT DETECTED   Streptococcus species NOT DETECTED NOT DETECTED   Streptococcus agalactiae NOT DETECTED NOT DETECTED   Streptococcus pneumoniae NOT DETECTED NOT DETECTED   Streptococcus pyogenes NOT DETECTED NOT DETECTED   Acinetobacter baumannii NOT DETECTED NOT DETECTED   Enterobacteriaceae species NOT DETECTED NOT DETECTED   Enterobacter cloacae complex NOT DETECTED NOT DETECTED   Escherichia coli NOT DETECTED NOT DETECTED   Klebsiella oxytoca NOT DETECTED NOT DETECTED   Klebsiella pneumoniae NOT DETECTED NOT DETECTED   Proteus species NOT DETECTED NOT DETECTED   Serratia marcescens NOT DETECTED NOT DETECTED   Haemophilus influenzae NOT DETECTED NOT DETECTED   Neisseria meningitidis NOT DETECTED NOT DETECTED   Pseudomonas aeruginosa NOT DETECTED NOT DETECTED   Candida albicans NOT DETECTED NOT DETECTED   Candida glabrata NOT DETECTED NOT DETECTED   Candida krusei NOT DETECTED NOT DETECTED   Candida parapsilosis NOT DETECTED NOT DETECTED   Candida tropicalis NOT DETECTED NOT DETECTED    Name of physician (or Provider) Contacted: Dr. Sherryll BurgerShah  Changes to prescribed antibiotics required: add vancomycin, continue meropenem until Urine cx results  Dannielle HuhZeigler, Dustin George 09/23/2017  5:16 PM

## 2017-09-23 NOTE — Progress Notes (Signed)
Initial Nutrition Assessment  DOCUMENTATION CODES:   Non-severe (moderate) malnutrition in context of acute illness/injury  INTERVENTION:   -Provide Glucerna Shake po TID, each supplement provides 220 kcal and 10 grams of protein -RD will continue to monitor for needs  NUTRITION DIAGNOSIS:   Malnutrition(moderate) related to acute illness (increased confusion, UTI) as evidenced by percent weight loss, energy intake < 75% for > 7 days.  GOAL:   Patient will meet greater than or equal to 90% of their needs  MONITOR:   PO intake, Supplement acceptance, Labs, Weight trends, I & O's  REASON FOR ASSESSMENT:   Consult Assessment of nutrition requirement/status  ASSESSMENT:   83 y.o. male with medical history significant of dementia,quadriplegia, hypertension, hyperlipidemia, diabetes mellitus,depression, PVD, peripheral neuropathy, chronic back pain, iron deficiency anemia, dCHF, CKD-3, who presents with generalized weakness, more confusion and pus in urine.  Patient with dementia and confusion from UTI. SLP evaluated today and recommends dysphagia 3 diet w/ minced meats, CHO modified and thin liquids based on previous MBS results.  Pt was eating well during previous admission in August 2018.  PO intakes worsened d/t increased confusion. Will provide Glucerna shakes.  Per chart review, pt has lost 21 lb since 8/19 (12% wt loss x 2 months, significant for time frame). Nutrition focused physical exam shows no sign of depletion of muscle mass or body fat.  Medications: Ferrous sulfate tablet daily Labs reviewed: CBGs: 137-141 Elevated Na  Diet Order:  DIET DYS 3 Room service appropriate? Yes; Fluid consistency: Thin  Skin:  Reviewed, no issues  Last BM:  PTA  Height:   Ht Readings from Last 1 Encounters:  09/23/17 5\' 10"  (1.778 m)    Weight:   Wt Readings from Last 1 Encounters:  09/23/17 158 lb 14.4 oz (72.1 kg)    Ideal Body Weight:  67.9 kg -adjusted for  quadriplegia  BMI:  Body mass index is 22.8 kg/m.  Estimated Nutritional Needs:   Kcal:  1600-1800  Protein:  70-80g  Fluid:  1.6L/day  EDUCATION NEEDS:   No education needs identified at this time  Tilda FrancoLindsey Winter Jocelyn, MS, RD, LDN [facility] Clinical Dietitian Pager: [phone] After Hours Pager: [phone]

## 2017-09-24 DIAGNOSIS — L8990 Pressure ulcer of unspecified site, unspecified stage: Secondary | ICD-10-CM | POA: Insufficient documentation

## 2017-09-24 LAB — CULTURE, BLOOD (ROUTINE X 2)
SPECIAL REQUESTS: ADEQUATE
Special Requests: ADEQUATE

## 2017-09-24 LAB — BASIC METABOLIC PANEL
Anion gap: 8 (ref 5–15)
BUN: 43 mg/dL — AB (ref 6–20)
CHLORIDE: 113 mmol/L — AB (ref 101–111)
CO2: 23 mmol/L (ref 22–32)
Calcium: 8.9 mg/dL (ref 8.9–10.3)
Creatinine, Ser: 2.18 mg/dL — ABNORMAL HIGH (ref 0.61–1.24)
GFR calc Af Amer: 30 mL/min — ABNORMAL LOW (ref >60)
GFR calc non Af Amer: 26 mL/min — ABNORMAL LOW (ref >60)
GLUCOSE: 119 mg/dL — AB (ref 65–99)
POTASSIUM: 3.8 mmol/L (ref 3.5–5.1)
Sodium: 144 mmol/L (ref 135–145)

## 2017-09-24 LAB — CBC
HEMATOCRIT: 34.4 % — AB (ref 39.0–52.0)
HEMOGLOBIN: 10.7 g/dL — AB (ref 13.0–17.0)
MCH: 30.7 pg (ref 26.0–34.0)
MCHC: 31.1 g/dL (ref 30.0–36.0)
MCV: 98.9 fL (ref 78.0–100.0)
Platelets: 413 10*3/uL — ABNORMAL HIGH (ref 150–400)
RBC: 3.48 MIL/uL — AB (ref 4.22–5.81)
RDW: 13.4 % (ref 11.5–15.5)
WBC: 12.5 10*3/uL — ABNORMAL HIGH (ref 4.0–10.5)

## 2017-09-24 LAB — GLUCOSE, CAPILLARY
GLUCOSE-CAPILLARY: 118 mg/dL — AB (ref 65–99)
GLUCOSE-CAPILLARY: 154 mg/dL — AB (ref 65–99)
Glucose-Capillary: 179 mg/dL — ABNORMAL HIGH (ref 65–99)
Glucose-Capillary: 260 mg/dL — ABNORMAL HIGH (ref 65–99)

## 2017-09-24 LAB — UREA NITROGEN, URINE: UREA NITROGEN UR: 654 mg/dL

## 2017-09-24 MED ORDER — GABAPENTIN 100 MG PO CAPS
100.0000 mg | ORAL_CAPSULE | Freq: Every day | ORAL | Status: DC
  Administered 2017-09-24 – 2017-09-25 (×2): 100 mg via ORAL
  Filled 2017-09-24 (×2): qty 1

## 2017-09-24 NOTE — Clinical Social Work Note (Addendum)
Clinical Social Work Assessment  Patient Details  Name: Javier Murillo MRN: [id number] Date of Birth: Jan 22, 1930  Date of referral:  09/24/17               Reason for consult:  Discharge Planning                Permission sought to share information with:  Case Manager, Facility Medical sales representative, Family Supports Permission granted to share information::  Yes, Verbal Permission Granted  Name::        Agency::  [facility]    Relationship::  Daughter Engineer, site Information:     Housing/Transportation Living arrangements for the past 2 months:  Assisted Press photographer (memory care unit/nursing unit) Source of Information:  Medical Team, Case Manager, Facility, Adult Children Patient Interpreter Needed:  None Criminal Activity/Legal Involvement Pertinent to Current Situation/Hospitalization:  No - Comment as needed Significant Relationships:  Adult Children, Other Family Members, Community Support Lives with:  Facility Resident Do you feel safe going back to the place where you live?  Yes Need for family participation in patient care:  Yes (Comment) (dementia)  Care giving concerns:  Patient admitted to [facility] due to change in baseline as patient more weak and lethargic and have discharge from penis, thus worked up for UTI/Sepsis.  Patient is a long term resident at [facility] in the memory care nursing unit.  Facility is equipped to continue treatment for patient post discharge as he is non ambulatory and has 2+ assist per daughter.  Patient only uses wheelchair and has assistance at facility with all ADLs and transports.  Daughter reports plan will be for him to return to [facility] at discharge. She reports no PT necessary at facility such as Home health due to patient cognition and wheelchair bound.    Social Worker assessment / plan:  LCSW completed assessment and consult as patient is from memory care unit at [facility]  Assessment  completed with daughter and call placed to memory care unit to update facility.   Spoke with Devonia at [facility]. Reviewed PT eval and recommendations with her and she states likely pt's ambulation/ADLs needs are at level that can continue to be met at ALF rather than SNF. Notes pt is in memory care therefore baseline is very dependent with daily functioning.   Will complete FL2 at discharge to reflect medications and most relevant clinical needs. Facility advises memory care needed on FL2 and to check back with them re: description of diet needs once DC diet known.   Plan: return to memory care at [facility].  Employment status:  Retired Database administrator PT Recommendations:  Not assessed at this time  Information / Referral to community resources:    none reported at this time.  Patient/Family's Response to care:  Daughter appreciative of update and information. She is agreeable to return to ALF  Patient/Family's Understanding of and Emotional Response to Diagnosis, Current Treatment, and Prognosis:  Daughter verbalizes understanding of levels of care and needs of patient. Confirms plans are to return and able to define prognosis of patient and needs.  Emotional Assessment Appearance:  Appears stated age Attitude/Demeanor/Rapport:    Affect (typically observed):  Accepting, Adaptable Orientation:  Oriented to Self, Oriented to Place Alcohol / Substance use:  Not Applicable Psych involvement (Current and /or in the community):  No (Comment)  Discharge Needs  Concerns to be addressed:  No discharge needs identified Readmission within  the last 30 days:  No Current discharge risk:  None Barriers to Discharge:  No Barriers Identified, Continued Medical Work up   Marshell Garfinkel 09/24/2017, 9:34 AM

## 2017-09-24 NOTE — Evaluation (Signed)
Physical Therapy Evaluation Patient Details Name: Javier Murillo MRN: [id number] DOB: [birth year] Today's Date: 09/24/2017   History of Present Illness  Pt. admitted with sepsis. a 83 y.o. male with medical history significant of dementia,quadriplegia, hypertension, hyperlipidemia, diabetes mellitus,depression, PVD, peripheral neuropathy, chronic back pain, iron deficiency anemia, dCHF, CKD-3, who presents with generalized weakness, more confusion and pus in urine.  Clinical Impression  The patient readily aroused with minimal stimulation. Patient pleasant and able to participate in  Bed mobility and transfers to recliner with 2 assist pivot. Per chart, patient is from ALF/memory care. No family present. Pt admitted with above diagnosis. Pt currently with functional limitations due to the deficits listed below (see PT Problem List).  Pt will benefit from skilled PT to increase their independence and safety with mobility to allow discharge to the venue listed below.       Follow Up Recommendations SNF (ALF if able to continue care at current level)    Equipment Recommendations  None recommended by PT    Recommendations for Other Services       Precautions / Restrictions Precautions Precautions: Fall      Mobility  Bed Mobility Overal bed mobility: Needs Assistance Bed Mobility: Supine to Sit     Supine to sit: Max assist;+2 for physical assistance        Transfers Overall transfer level: Needs assistance Equipment used: 2 person hand held assist Transfers: Stand Pivot Transfers   Stand pivot transfers: Max assist;+2 physical assistance       General transfer comment: attempted x 2 to stand with bilateral arm hold, on second attempt, pivot to recliner, small shuffling steps  Ambulation/Gait                Stairs            Wheelchair Mobility    Modified Rankin (Stroke Patients Only)       Balance Overall balance assessment: Needs  assistance Sitting-balance support: Feet supported;Bilateral upper extremity supported Sitting balance-Leahy Scale: Poor   Postural control: Posterior lean Standing balance support: During functional activity;Bilateral upper extremity supported Standing balance-Leahy Scale: Zero                               Pertinent Vitals/Pain Pain Assessment: No/denies pain    Home Living Family/patient expects to be discharged to:: Assisted living                      Prior Function Level of Independence: Needs assistance      ADL's / Homemaking Assistance Needed: per chart is max to dep with ADLs.        Hand Dominance   Dominant Hand: Right    Extremity/Trunk Assessment   Upper Extremity Assessment Upper Extremity Assessment: Defer to OT evaluation    Lower Extremity Assessment Lower Extremity Assessment: Generalized weakness    Cervical / Trunk Assessment Cervical / Trunk Assessment: Kyphotic  Communication   Communication: No difficulties  Cognition Arousal/Alertness: Awake/alert Behavior During Therapy: WFL for tasks assessed/performed Overall Cognitive Status: History of cognitive impairments - at baseline                                 General Comments: Pt. requires extra time to follow directions.       General Comments      Exercises  Assessment/Plan    PT Assessment Patient needs continued PT services  PT Problem List Decreased strength;Decreased range of motion;Decreased balance;Decreased mobility;Decreased safety awareness;Decreased cognition       PT Treatment Interventions Functional mobility training;Therapeutic activities;Therapeutic exercise;Patient/family education    PT Goals (Current goals can be found in the Care Plan section)  Acute Rehab PT Goals Patient Stated Goal: none stated PT Goal Formulation: Patient unable to participate in goal setting Time For Goal Achievement: 10/08/17 Potential to  Achieve Goals: Fair    Frequency Min 2X/week   Barriers to discharge        Co-evaluation PT/OT/SLP Co-Evaluation/Treatment: Yes Reason for Co-Treatment: Complexity of the patient's impairments (multi-system involvement) PT goals addressed during session: Mobility/safety with mobility OT goals addressed during session: ADL's and self-care       AM-PAC PT "6 Clicks" Daily Activity  Outcome Measure Difficulty turning over in bed (including adjusting bedclothes, sheets and blankets)?: Unable Difficulty moving from lying on back to sitting on the side of the bed? : Unable Difficulty sitting down on and standing up from a chair with arms (e.g., wheelchair, bedside commode, etc,.)?: Unable Help needed moving to and from a bed to chair (including a wheelchair)?: Total Help needed walking in hospital room?: Total Help needed climbing 3-5 steps with a railing? : Total 6 Click Score: 6    End of Session Equipment Utilized During Treatment: Gait belt Activity Tolerance: Patient tolerated treatment well Patient left: in chair;with call bell/phone within reach;with chair alarm set Nurse Communication: Mobility status PT Visit Diagnosis: Unsteadiness on feet (R26.81)    Time: [id number] PT Time Calculation (min) (ACUTE ONLY): 25 min   Charges:   PT Evaluation $PT Eval Low Complexity: 1 Low     PT G CodesBlanchard Kelch:        Ra Pfiester PT [phone]   Rada HayHill, Tonye Tancredi Elizabeth 09/24/2017, 9:57 AM

## 2017-09-24 NOTE — Progress Notes (Signed)
PROGRESS NOTE    Javier Murillo  ZOX:[id number] DOB: January 10, 1930 DOA: 09/22/2017 PCP: Juluis Rainier, MD    Brief Narrative:   Javier Murillo is a 83 y.o. male with medical history significant of dementia,quadriplegia, hypertension, hyperlipidemia, diabetes mellitus,depression, PVD, peripheral neuropathy, chronic back pain, iron deficiency anemia, dCHF, CKD-3, who presents with generalized weakness, more confusion and pus in urine. Patient has dementia and is unable to provide accurate medical history, therefore, most of the history is obtained by discussing the case with ED physician, per EMS report, and with the nursing staff.  Spoke with daughter in the room this morning who is the primary historian she states that he has been able to tolerate his dysphagia diet and did have lunch and dinner last night.He continues to remain somewhat somnolent after being started on gabapentin. His cultures are currently pending. He was also started on vancomycin for some gram-positive coag-negative organisms growing in his blood cultures 2 sets.  Assessment & Plan:   Principal Problem:   UTI (urinary tract infection) Active Problems:   HTN (hypertension)   Quadriplegia and quadriparesis (HCC)   Anemia   Sepsis (HCC)   Acute renal failure superimposed on stage 3 chronic kidney disease (HCC)   Type II diabetes mellitus with renal manifestations (HCC)   Chronic diastolic CHF (congestive heart failure) (HCC)   Acute metabolic encephalopathy   Pressure injury of skin   Sepsis due to UTI (urinary tract infection): pt meets criteria for sepsis with leukocytosis, tachypnea, soft blood pressure. Lactic acid is normal  Currently hemodynamically stable. Patient had positive urine culture with Klebsiella pneumonia on 10/25/16.   -Continue IV Merrem and await urine and sputum cultures -Continue IV Vancomycin until final blood cultures and consider Echo if needed -No sputum collected -CXR  negative  HTN-stable: -hold oral Bp med due to soft bp  -IV hydralazine when necessary  Anemia-Iron deficiency-stable -Continue iron supplement  AoCKD-III: Baseline Cre is  1.8 to 1.9, pt's Cre is 3.23->2.18 and BUN 60->43 on admission. Likely due to prerenal secondary to dehydration and continuation of ACEI, diuretics - IVF DC for today since now eating and +2.2L of fluid - Follow up renal function by BMP in AM - Hold lisinopril and lasix  Type II diabetes mellitus with renal manifestations: Last A1c 7.2 on 09/06/17, fairly controled. Patient is taking Toujeo at home -will decrease Toujeo dose from 6-27 to 3-14 units bid -SSI -BG controlled  Chronic diastolic CHF (congestive heart failure) (HCC): 2-D echo on 10/25/16 showed EF 60-65% with grade 1 diastolic dysfunction. No leg edema or JVD. CHF is compensated. -hold lasix due to sepsis and worsening renal  -BNP 300; no active CHF noted  Acute metabolic encephalopathy: most likely due to UTI -Frequent neuro checks ongoing  DM Neuropathy: -gabapentin started 10/18 now to qhs due to drowsiness  DVT ppx:   SQ Lovenox Code Status: Full code Family Communication: None at bed side. Disposition Plan:  Anticipate discharge back to previous SNF Consults called:  none Admission status:  Inpatient/tele    Consultants:  None   Procedures: None   Antimicrobials: Meropenem 09/22/2017-> Vancomycin 09/23/2017->  Objective: Vitals:   09/23/17 0027 09/23/17 0534 09/23/17 2037 09/24/17 0618  BP: (!) 110/59 118/60 (!) 145/81 129/79  Pulse: 74 76 79 75  Resp: 16 18 18 18   Temp: 99.3 F (37.4 C) 99 F (37.2 C) 98.8 F (37.1 C) 98.5 F (36.9 C)  TempSrc: Axillary Oral Axillary Oral  SpO2: 96% 98% 97%  98%  Weight: 72.1 kg (158 lb 14.4 oz)     Height: 5\' 10"  (1.778 m)       Intake/Output Summary (Last 24 hours) at 09/24/17 1231 Last data filed at 09/24/17 0600  Gross per 24 hour  Intake             1920 ml  Output                 0 ml  Net             1920 ml   Filed Weights   09/22/17 1843 09/23/17 0027  Weight: 72.6 kg (160 lb) 72.1 kg (158 lb 14.4 oz)    Examination:  General exam: Appears calm and comfortable ; mimimally responsive; somnolent Respiratory system: Clear to auscultation. Respiratory effort normal. Cardiovascular system: S1 & S2 heard, RRR. No JVD, murmurs, rubs, gallops or clicks. No pedal edema. Gastrointestinal system: Abdomen is nondistended, soft and nontender. No organomegaly or masses felt. Normal bowel sounds heard. Central nervous system: Alert and oriented. No focal neurological deficits. Extremities: Symmetric 5 x 5 power. Skin: No rashes, lesions or ulcers Psychiatry: Cannot adequately assess due to severe dementia   Data Reviewed: I have personally reviewed following labs and imaging studies  CBC:  Recent Labs Lab 09/22/17 1822 09/23/17 0057 09/24/17 0449  WBC 15.2* 13.1* 12.5*  HGB 11.5* 9.8* 10.7*  HCT 35.5* 30.5* 34.4*  MCV 98.1 97.8 98.9  PLT 454* 374 413*   Basic Metabolic Panel:  Recent Labs Lab 09/22/17 1822 09/23/17 0057 09/24/17 0449  NA 145 146* 144  K 4.3 4.2 3.8  CL 108 114* 113*  CO2 24 23 23   GLUCOSE 122* 159* 119*  BUN 65* 59* 43*  CREATININE 3.23* 2.73* 2.18*  CALCIUM 9.4 8.6* 8.9   GFR: Estimated Creatinine Clearance: 25.3 mL/min (A) (by C-G formula based on SCr of 2.18 mg/dL (H)). Liver Function Tests:  Recent Labs Lab 09/22/17 1832  AST 51*  ALT 28  ALKPHOS 68  BILITOT 0.7  PROT 7.6  ALBUMIN 3.4*   No results for input(s): LIPASE, AMYLASE in the last 168 hours. No results for input(s): AMMONIA in the last 168 hours. Coagulation Profile: No results for input(s): INR, PROTIME in the last 168 hours. Cardiac Enzymes: No results for input(s): CKTOTAL, CKMB, CKMBINDEX, TROPONINI in the last 168 hours. BNP (last 3 results) No results for input(s): PROBNP in the last 8760 hours. HbA1C: No results for input(s): HGBA1C in  the last 72 hours. CBG:  Recent Labs Lab 09/23/17 1237 09/23/17 1708 09/23/17 2033 09/24/17 0918 09/24/17 1206  GLUCAP 141* 103* 155* 118* 179*   Lipid Profile: No results for input(s): CHOL, HDL, LDLCALC, TRIG, CHOLHDL, LDLDIRECT in the last 72 hours. Thyroid Function Tests: No results for input(s): TSH, T4TOTAL, FREET4, T3FREE, THYROIDAB in the last 72 hours. Anemia Panel: No results for input(s): VITAMINB12, FOLATE, FERRITIN, TIBC, IRON, RETICCTPCT in the last 72 hours. Sepsis Labs:  Recent Labs Lab 09/22/17 1842 09/22/17 2156  LATICACIDVEN 1.93* 1.42    Recent Results (from the past 240 hour(s))  Blood Culture (routine x 2)     Status: None (Preliminary result)   Collection Time: 09/22/17  6:22 PM  Result Value Ref Range Status   Specimen Description BLOOD RIGHT ANTECUBITAL  Final   Special Requests   Final    BOTTLES DRAWN AEROBIC AND ANAEROBIC Blood Culture adequate volume   Culture  Setup Time   Final    GRAM  POSITIVE COCCI IN BOTH AEROBIC AND ANAEROBIC BOTTLES Organism ID to follow CRITICAL RESULT CALLED TO, READ BACK BY AND VERIFIED WITH: D. Zeigler Pharm.D. 17:10 09/23/17 ([email or web address]) Performed at [facility], [address] [place], [place] [address]    Culture GRAM POSITIVE COCCI  Final   Report Status PENDING  Incomplete  Blood Culture (routine x 2)     Status: None (Preliminary result)   Collection Time: 09/22/17  6:22 PM  Result Value Ref Range Status   Specimen Description BLOOD LEFT ANTECUBITAL  Final   Special Requests   Final    BOTTLES DRAWN AEROBIC AND ANAEROBIC Blood Culture adequate volume   Culture  Setup Time   Final    GRAM POSITIVE COCCI IN BOTH AEROBIC AND ANAEROBIC BOTTLES CRITICAL VALUE NOTED.  VALUE IS CONSISTENT WITH PREVIOUSLY REPORTED AND CALLED VALUE. Performed at [facility], [address], [place], [place] [address]    Culture GRAM POSITIVE COCCI  Final   Report Status PENDING  Incomplete  Blood Culture ID  Panel (Reflexed)     Status: Abnormal   Collection Time: 09/22/17  6:22 PM  Result Value Ref Range Status   Enterococcus species NOT DETECTED NOT DETECTED Final   Listeria monocytogenes NOT DETECTED NOT DETECTED Final   Staphylococcus species DETECTED (A) NOT DETECTED Final    Comment: Methicillin (oxacillin) resistant coagulase negative staphylococcus. Possible blood culture contaminant (unless isolated from more than one blood culture draw or clinical case suggests pathogenicity). No antibiotic treatment is indicated for blood  culture contaminants. CRITICAL RESULT CALLED TO, READ BACK BY AND VERIFIED WITH: D. Zeigler Pharm.D. 17:10 09/23/17 ([email or web address])    Staphylococcus aureus NOT DETECTED NOT DETECTED Final   Methicillin resistance DETECTED (A) NOT DETECTED Final    Comment: CRITICAL RESULT CALLED TO, READ BACK BY AND VERIFIED WITH: D. Zeigler Pharm.D. 17:10 09/23/17 ([email or web address])    Streptococcus species NOT DETECTED NOT DETECTED Final   Streptococcus agalactiae NOT DETECTED NOT DETECTED Final   Streptococcus pneumoniae NOT DETECTED NOT DETECTED Final   Streptococcus pyogenes NOT DETECTED NOT DETECTED Final   Acinetobacter baumannii NOT DETECTED NOT DETECTED Final   Enterobacteriaceae species NOT DETECTED NOT DETECTED Final   Enterobacter cloacae complex NOT DETECTED NOT DETECTED Final   Escherichia coli NOT DETECTED NOT DETECTED Final   Klebsiella oxytoca NOT DETECTED NOT DETECTED Final   Klebsiella pneumoniae NOT DETECTED NOT DETECTED Final   Proteus species NOT DETECTED NOT DETECTED Final   Serratia marcescens NOT DETECTED NOT DETECTED Final   Haemophilus influenzae NOT DETECTED NOT DETECTED Final   Neisseria meningitidis NOT DETECTED NOT DETECTED Final   Pseudomonas aeruginosa NOT DETECTED NOT DETECTED Final   Candida albicans NOT DETECTED NOT DETECTED Final   Candida glabrata NOT DETECTED NOT DETECTED Final   Candida krusei NOT DETECTED NOT DETECTED Final   Candida parapsilosis  NOT DETECTED NOT DETECTED Final   Candida tropicalis NOT DETECTED NOT DETECTED Final  MRSA PCR Screening     Status: None   Collection Time: 09/23/17  1:18 AM  Result Value Ref Range Status   MRSA by PCR NEGATIVE NEGATIVE Final    Comment:        The GeneXpert MRSA Assay (FDA approved for NASAL specimens only), is one component of a comprehensive MRSA colonization surveillance program. It is not intended to diagnose MRSA infection nor to guide or monitor treatment for MRSA infections.       Radiology Studies: Dg Chest [facility]  Result Date: 09/22/2017 CLINICAL DATA:  Shortness of breath EXAM: PORTABLE CHEST 1 VIEW COMPARISON:  Chest radiograph 07/23/2017 FINDINGS: The heart size and mediastinal contours are within normal limits. Both lungs are clear. The visualized skeletal structures are unremarkable. IMPRESSION: No active disease. Electronically Signed   By: Deatra RobinsonKevin  Herman M.D.   On: 09/22/2017 18:01     Scheduled Meds: . cholecalciferol  1,000 Units Oral QPM  . enoxaparin (LOVENOX) injection  30 mg Subcutaneous Daily  . feeding supplement (GLUCERNA SHAKE)  237 mL Oral TID BM  . ferrous sulfate  325 mg Oral Q breakfast  . gabapentin  100 mg Oral QHS  . insulin aspart  0-9 Units Subcutaneous TID WC  . insulin glargine  14 Units Subcutaneous QHS  . insulin glargine  3 Units Subcutaneous Daily  . loratadine  10 mg Oral Daily   Continuous Infusions: . meropenem (MERREM) IV 500 mg (09/24/17 1122)     LOS: 2 days    Time spent: 30 minutes    Audia Amick Hoover Brunette Basia Mcginty, DO [facility] Pager [phone]  If 7PM-7AM, please contact night-coverage [email or web address] Password [email or web address] 09/24/2017, 12:31 PM

## 2017-09-24 NOTE — Evaluation (Signed)
Occupational Therapy Evaluation Patient Details Name: Javier Murillo MRN: [id number] DOB: [birth year] Today's Date: 09/24/2017    History of Present Illness Pt. admitted with sepsis. a 83 y.o. male with medical history significant of dementia,quadriplegia, hypertension, hyperlipidemia, diabetes mellitus,depression, PVD, peripheral neuropathy, chronic back pain, iron deficiency anemia, dCHF, CKD-3, who presents with generalized weakness, more confusion and pus in urine.   Clinical Impression   Pt. Was able to follow basic one step directions with cues and physical prompts. Pt. Requires max a of 2 for supine to sit and stand pivot transfer. Pt. Was able to wash face with Min A. Pt. Was Max a with donning and doffing of gown. Per chart he appears to be at baseline. Per chart pt. Was a resident of a memory care unit. Pt. Can receive additional OT as needed at d/c location. No further acute OT recommended.     Follow Up Recommendations  Other (comment) (Return to previous living situation)    Equipment Recommendations       Recommendations for Other Services       Precautions / Restrictions Precautions Precautions: Fall      Mobility Bed Mobility Overal bed mobility: Needs Assistance Bed Mobility: Supine to Sit     Supine to sit: Max assist;+2 for physical assistance        Transfers Overall transfer level: Needs assistance Equipment used: 2 person hand held assist Transfers: Stand Pivot Transfers   Stand pivot transfers: Max assist;+2 physical assistance            Balance                                           ADL either performed or assessed with clinical judgement   ADL Overall ADL's : At baseline                                             Vision Baseline Vision/History: Wears glasses Wears Glasses: At all times Patient Visual Report: No change from baseline (Pt. is not an acurite historian. )       Perception      Praxis      Pertinent Vitals/Pain Pain Assessment: No/denies pain     Hand Dominance Right   Extremity/Trunk Assessment Upper Extremity Assessment Upper Extremity Assessment: Generalized weakness           Communication Communication Communication: No difficulties   Cognition Arousal/Alertness: Awake/alert Behavior During Therapy: WFL for tasks assessed/performed Overall Cognitive Status: History of cognitive impairments - at baseline                                 General Comments: Pt. requires extra time to follow directions.    General Comments       Exercises     Shoulder Instructions      Home Living Family/patient expects to be discharged to:: Assisted living                                        Prior Functioning/Environment Level of Independence: Needs assistance    ADL's / Homemaking Assistance  Needed: per chart is max to dep with ADLs.            OT Problem List: Decreased strength;Decreased activity tolerance;Impaired balance (sitting and/or standing);Decreased cognition      OT Treatment/Interventions:      OT Goals(Current goals can be found in the care plan section) Acute Rehab OT Goals Patient Stated Goal: none stated  OT Frequency:     Barriers to D/C:            Co-evaluation PT/OT/SLP Co-Evaluation/Treatment: Yes Reason for Co-Treatment: Complexity of the patient's impairments (multi-system involvement);For patient/therapist safety   OT goals addressed during session: ADL's and self-care      AM-PAC PT "6 Clicks" Daily Activity     Outcome Measure Help from another person eating meals?: A Lot Help from another person taking care of personal grooming?: A Lot Help from another person toileting, which includes using toliet, bedpan, or urinal?: Total Help from another person bathing (including washing, rinsing, drying)?: Total Help from another person to put on and taking off regular upper  body clothing?: A Lot Help from another person to put on and taking off regular lower body clothing?: Total 6 Click Score: 9   End of Session Nurse Communication:  (ok'ed therapy)  Activity Tolerance: Patient tolerated treatment well Patient left: in bed;with call bell/phone within reach  OT Visit Diagnosis: Unsteadiness on feet (R26.81);Muscle weakness (generalized) (M62.81)                Time: [id number] OT Time Calculation (min): 25 min Charges:  OT General Charges $OT Visit: 1 Visit OT Evaluation $OT Eval Moderate Complexity: 1 Mod G-Codes:     6 clicks   Truett Mcfarlan 09/24/2017, 9:41 AM

## 2017-09-24 NOTE — Progress Notes (Signed)
  Speech Language Pathology Treatment: Dysphagia  Patient Details Name: Javier SerRobert L Murillo MRN: [id number] DOB: May 11, 1930 Today's Date: 09/24/2017 Time: 1210-1226 SLP Time Calculation (min) (ACUTE ONLY): 16 min  Assessment / Plan / Recommendation Clinical Impression  Pt with intake listed as 50%, Sitting upright in chair and appears comfortable. Pt with improved tolerance of intake today and faster initiation of swallow - suspect due to improved mentation.  Pt requesting coca cola, provided him with 2.5 ounces of soda - no overt indication of aspiration with small single boluses from cup.  Pt did need encouragement to self feed today but able to hold cup with left hand adequately.  Observed with puree and cola today with max cues to slow rate.  Voice is marginally weak - ? Baseline or due to deconditioning.  Will follow up x1 next week as pt may benefit from RMST if he can partcipate/tolerate.  Daughter Steward DroneBrenda not present today.    HPI HPI: 83 [facility], decreased po intake, found to be septic due to UTI.  PMH + for dementia, quadriparesis, DM, PVD, peripheral neuropathy, ACDF 08/22/14 C3-C4, on lasix, former smoker.  WBC increased leukyocytosis, CXR negative.  Pt have prior MBS showed no aspiraiton of puree/solids, penetration with thin and trace aspiration with premature spillage, Pt was placed on dys3/thin diet - small boluses.        SLP Plan  Continue with current plan of care       Recommendations  Diet recommendations: Dysphagia 3 (mechanical soft);Thin liquid Liquids provided via: Cup;No straw Medication Administration: Whole meds with puree (if small whole) Supervision: Staff to assist with self feeding Compensations: Slow rate;Small sips/bites Postural Changes and/or Swallow Maneuvers: Seated upright 90 degrees;Upright 30-60 min after meal                Oral Care Recommendations: Oral care BID Plan: Continue with current plan of care        GO                Chales AbrahamsKimball, Carie Kapuscinski Ann 09/24/2017, 12:51 PM   Donavan Burnetamara Shemar Plemmons, MS [facility] SLP [phone]

## 2017-09-25 LAB — BASIC METABOLIC PANEL
Anion gap: 10 (ref 5–15)
BUN: 33 mg/dL — AB (ref 6–20)
CALCIUM: 9 mg/dL (ref 8.9–10.3)
CHLORIDE: 111 mmol/L (ref 101–111)
CO2: 24 mmol/L (ref 22–32)
CREATININE: 1.84 mg/dL — AB (ref 0.61–1.24)
GFR calc non Af Amer: 32 mL/min — ABNORMAL LOW (ref >60)
GFR, EST AFRICAN AMERICAN: 37 mL/min — AB (ref >60)
GLUCOSE: 144 mg/dL — AB (ref 65–99)
Potassium: 4 mmol/L (ref 3.5–5.1)
Sodium: 145 mmol/L (ref 135–145)

## 2017-09-25 LAB — GLUCOSE, CAPILLARY
GLUCOSE-CAPILLARY: 180 mg/dL — AB (ref 65–99)
Glucose-Capillary: 158 mg/dL — ABNORMAL HIGH (ref 65–99)
Glucose-Capillary: 277 mg/dL — ABNORMAL HIGH (ref 65–99)
Glucose-Capillary: 244 mg/dL — ABNORMAL HIGH (ref 65–99)

## 2017-09-25 LAB — CBC
HEMATOCRIT: 32.8 % — AB (ref 39.0–52.0)
HEMOGLOBIN: 10.3 g/dL — AB (ref 13.0–17.0)
MCH: 30.7 pg (ref 26.0–34.0)
MCHC: 31.4 g/dL (ref 30.0–36.0)
MCV: 97.9 fL (ref 78.0–100.0)
Platelets: 435 10*3/uL — ABNORMAL HIGH (ref 150–400)
RBC: 3.35 MIL/uL — ABNORMAL LOW (ref 4.22–5.81)
RDW: 13.1 % (ref 11.5–15.5)
WBC: 11.6 10*3/uL — ABNORMAL HIGH (ref 4.0–10.5)

## 2017-09-25 LAB — VANCOMYCIN, RANDOM: VANCOMYCIN RM: 12

## 2017-09-25 MED ORDER — VANCOMYCIN HCL IN DEXTROSE 750-5 MG/150ML-% IV SOLN
750.0000 mg | Freq: Once | INTRAVENOUS | Status: DC
  Filled 2017-09-25 (×2): qty 150

## 2017-09-25 MED ORDER — VANCOMYCIN HCL IN DEXTROSE 750-5 MG/150ML-% IV SOLN
750.0000 mg | INTRAVENOUS | Status: DC
  Filled 2017-09-25: qty 150

## 2017-09-25 NOTE — Progress Notes (Signed)
Pt had an uneventful day/ dgt at bedside at the beginning of the shift. Dgt became very tearful and shared she felt overwhelmed. Allowed patient to vent. Reassured her and encouraged her to go home and rest. She did. Called her at 1400 with update on her dad. She was grateful and appreciative.Cont with plan of care

## 2017-09-25 NOTE — Progress Notes (Signed)
Left voicemail for pt's ALF ([facility]) re: their being equipped/prepared to have pt return to ALF today if stable for DC today.  See previous CSW assessment- pt is resident of memory care there.  Ilean SkillMeghan Finnleigh Marchetti, MSW, LCSW Clinical Social Work 09/25/2017 [phone] weekend coverage

## 2017-09-25 NOTE — Progress Notes (Signed)
PROGRESS NOTE    Javier Murillo  ZOX:[id number] DOB: 1930-11-07 DOA: 09/22/2017 PCP: Juluis Rainier, MD    Brief Narrative:   Javier Murillo is a 83 y.o. male with medical history significant of dementia,quadriplegia, hypertension, hyperlipidemia, diabetes mellitus,depression, PVD, peripheral neuropathy, chronic back pain, iron deficiency anemia, dCHF, CKD-3, who presents with generalized weakness, more confusion and pus in urine. Patient has dementia and is unable to provide accurate medical history, therefore, most of the history is obtained by discussing the case with ED physician, per EMS report, and with the nursing staff.  Spoke with daughter in the room this morning who is the primary historian she states that he has been able to tolerate his dysphagia diet and did have lunch and dinner last night. He is more awake and alert this morning. His blood cultures appear to be contaminants and his labwork is improving. His urine cultures are still pending. He continues with a mild cough.  Assessment & Plan:   Principal Problem:   UTI (urinary tract infection) Active Problems:   HTN (hypertension)   Quadriplegia and quadriparesis (HCC)   Anemia   Sepsis (HCC)   Acute renal failure superimposed on stage 3 chronic kidney disease (HCC)   Type II diabetes mellitus with renal manifestations (HCC)   Chronic diastolic CHF (congestive heart failure) (HCC)   Acute metabolic encephalopathy   Pressure injury of skin   Sepsis due to UTI (urinary tract infection): pt meets criteria for sepsis with leukocytosis, tachypnea, soft blood pressure. Lactic acid is normal  Currently hemodynamically stable. Patient had positive urine culture with Klebsiella pneumonia on 10/25/16.   -Continue IV Merrem and await urine cultures -DC IV Vancomycin -No sputum collected -CXR negative  HTN-stable: -hold oral Bp med due to soft bp  -IV hydralazine when necessary  Anemia-Iron  deficiency-stable -Continue iron supplement  AoCKD-III-resolved: Baseline Cre is  1.8 to 1.9, pt's Cre is 3.23->1.84 and BUN 60->43 on admission. Likely due to prerenal secondary to dehydration and continuation of ACEI, diuretics - IVF DC on 10/19 - Follow up renal function by BMP in AM - Continue to hold lisinopril and lasix  Type II diabetes mellitus with renal manifestations: Last A1c 7.2 on 09/06/17, fairly controled. Patient is taking Toujeo at home -will decrease Toujeo dose from 6-27 to 3-14 units bid -SSI -BG controlled  Chronic diastolic CHF (congestive heart failure) (HCC): 2-D echo on 10/25/16 showed EF 60-65% with grade 1 diastolic dysfunction. No leg edema or JVD. CHF is compensated. -hold lasix due to sepsis and worsening renal  -BNP 300; no active CHF noted  Acute metabolic encephalopathy: most likely due to UTI -Frequent neuro checks ongoing  DM Neuropathy: -gabapentin started 10/18 now to qhs due to drowsiness  DVT ppx:   SQ Lovenox Code Status: Full code Family Communication: None at bed side. Disposition Plan:  Anticipate discharge back to previous SNF Consults called:  none Admission status:  Inpatient/tele    Consultants:  None   Procedures: None   Antimicrobials: Meropenem 09/22/2017-> Vancomycin 09/23/2017->  Objective: Vitals:   09/24/17 0618 09/24/17 1407 09/24/17 2211 09/25/17 0640  BP: 129/79 133/77 112/62 124/80  Pulse: 75 70 64 63  Resp: 18 18 18 18   Temp: 98.5 F (36.9 C) 98.5 F (36.9 C) 97.9 F (36.6 C) 98.1 F (36.7 C)  TempSrc: Oral Oral Oral Oral  SpO2: 98% 96% 94% 98%  Weight:      Height:        Intake/Output Summary (Last  24 hours) at 09/25/17 1302 Last data filed at 09/25/17 1211  Gross per 24 hour  Intake              770 ml  Output             1565 ml  Net             -795 ml   Filed Weights   09/22/17 1843 09/23/17 0027  Weight: 72.6 kg (160 lb) 72.1 kg (158 lb 14.4 oz)    Examination:  General exam:  Appears calm and comfortable ; mimimally responsive; somnolent Respiratory system: Clear to auscultation. Respiratory effort normal. Cardiovascular system: S1 & S2 heard, RRR. No JVD, murmurs, rubs, gallops or clicks. No pedal edema. Gastrointestinal system: Abdomen is nondistended, soft and nontender. No organomegaly or masses felt. Normal bowel sounds heard. Central nervous system: Alert and oriented. No focal neurological deficits. Extremities: Symmetric 5 x 5 power. Skin: No rashes, lesions or ulcers Psychiatry: Cannot adequately assess due to severe dementia   Data Reviewed: I have personally reviewed following labs and imaging studies  CBC:  Recent Labs Lab 09/22/17 1822 09/23/17 0057 09/24/17 0449 09/25/17 0500  WBC 15.2* 13.1* 12.5* 11.6*  HGB 11.5* 9.8* 10.7* 10.3*  HCT 35.5* 30.5* 34.4* 32.8*  MCV 98.1 97.8 98.9 97.9  PLT 454* 374 413* 435*   Basic Metabolic Panel:  Recent Labs Lab 09/22/17 1822 09/23/17 0057 09/24/17 0449 09/25/17 0500  NA 145 146* 144 145  K 4.3 4.2 3.8 4.0  CL 108 114* 113* 111  CO2 24 23 23 24   GLUCOSE 122* 159* 119* 144*  BUN 65* 59* 43* 33*  CREATININE 3.23* 2.73* 2.18* 1.84*  CALCIUM 9.4 8.6* 8.9 9.0   GFR: Estimated Creatinine Clearance: 29.9 mL/min (A) (by C-G formula based on SCr of 1.84 mg/dL (H)). Liver Function Tests:  Recent Labs Lab 09/22/17 1832  AST 51*  ALT 28  ALKPHOS 68  BILITOT 0.7  PROT 7.6  ALBUMIN 3.4*   No results for input(s): LIPASE, AMYLASE in the last 168 hours. No results for input(s): AMMONIA in the last 168 hours. Coagulation Profile: No results for input(s): INR, PROTIME in the last 168 hours. Cardiac Enzymes: No results for input(s): CKTOTAL, CKMB, CKMBINDEX, TROPONINI in the last 168 hours. BNP (last 3 results) No results for input(s): PROBNP in the last 8760 hours. HbA1C: No results for input(s): HGBA1C in the last 72 hours. CBG:  Recent Labs Lab 09/24/17 1206 09/24/17 1650  09/24/17 2208 09/25/17 0822 09/25/17 1151  GLUCAP 179* 260* 154* 158* 277*   Lipid Profile: No results for input(s): CHOL, HDL, LDLCALC, TRIG, CHOLHDL, LDLDIRECT in the last 72 hours. Thyroid Function Tests: No results for input(s): TSH, T4TOTAL, FREET4, T3FREE, THYROIDAB in the last 72 hours. Anemia Panel: No results for input(s): VITAMINB12, FOLATE, FERRITIN, TIBC, IRON, RETICCTPCT in the last 72 hours. Sepsis Labs:  Recent Labs Lab 09/22/17 1842 09/22/17 2156  LATICACIDVEN 1.93* 1.42    Recent Results (from the past 240 hour(s))  Blood Culture (routine x 2)     Status: Abnormal   Collection Time: 09/22/17  6:22 PM  Result Value Ref Range Status   Specimen Description BLOOD RIGHT ANTECUBITAL  Final   Special Requests   Final    BOTTLES DRAWN AEROBIC AND ANAEROBIC Blood Culture adequate volume   Culture  Setup Time   Final    GRAM POSITIVE COCCI IN BOTH AEROBIC AND ANAEROBIC BOTTLES CRITICAL RESULT CALLED TO, READ BACK  BY AND VERIFIED WITH: D. Zeigler Pharm.D. 17:10 09/23/17 ([email or web address])    Culture (A)  Final    STAPHYLOCOCCUS CAPITIS THE SIGNIFICANCE OF ISOLATING THIS ORGANISM FROM A SINGLE SET OF BLOOD CULTURES WHEN MULTIPLE SETS ARE DRAWN IS UNCERTAIN. PLEASE NOTIFY THE MICROBIOLOGY DEPARTMENT WITHIN ONE WEEK IF SPECIATION AND SENSITIVITIES ARE REQUIRED. Performed at [facility], [address] [place], [place] [address]    Report Status 09/24/2017 FINAL  Final  Blood Culture (routine x 2)     Status: Abnormal   Collection Time: 09/22/17  6:22 PM  Result Value Ref Range Status   Specimen Description BLOOD LEFT ANTECUBITAL  Final   Special Requests   Final    BOTTLES DRAWN AEROBIC AND ANAEROBIC Blood Culture adequate volume   Culture  Setup Time   Final    GRAM POSITIVE COCCI IN BOTH AEROBIC AND ANAEROBIC BOTTLES CRITICAL VALUE NOTED.  VALUE IS CONSISTENT WITH PREVIOUSLY REPORTED AND CALLED VALUE.    Culture (A)  Final    STAPHYLOCOCCUS EPIDERMIDIS THE  SIGNIFICANCE OF ISOLATING THIS ORGANISM FROM A SINGLE SET OF BLOOD CULTURES WHEN MULTIPLE SETS ARE DRAWN IS UNCERTAIN. PLEASE NOTIFY THE MICROBIOLOGY DEPARTMENT WITHIN ONE WEEK IF SPECIATION AND SENSITIVITIES ARE REQUIRED. Performed at [facility], [address] [place], [place] [address]    Report Status 09/24/2017 FINAL  Final  Blood Culture ID Panel (Reflexed)     Status: Abnormal   Collection Time: 09/22/17  6:22 PM  Result Value Ref Range Status   Enterococcus species NOT DETECTED NOT DETECTED Final   Listeria monocytogenes NOT DETECTED NOT DETECTED Final   Staphylococcus species DETECTED (A) NOT DETECTED Final    Comment: Methicillin (oxacillin) resistant coagulase negative staphylococcus. Possible blood culture contaminant (unless isolated from more than one blood culture draw or clinical case suggests pathogenicity). No antibiotic treatment is indicated for blood  culture contaminants. CRITICAL RESULT CALLED TO, READ BACK BY AND VERIFIED WITH: D. Zeigler Pharm.D. 17:10 09/23/17 ([email or web address])    Staphylococcus aureus NOT DETECTED NOT DETECTED Final   Methicillin resistance DETECTED (A) NOT DETECTED Final    Comment: CRITICAL RESULT CALLED TO, READ BACK BY AND VERIFIED WITH: D. Zeigler Pharm.D. 17:10 09/23/17 ([email or web address])    Streptococcus species NOT DETECTED NOT DETECTED Final   Streptococcus agalactiae NOT DETECTED NOT DETECTED Final   Streptococcus pneumoniae NOT DETECTED NOT DETECTED Final   Streptococcus pyogenes NOT DETECTED NOT DETECTED Final   Acinetobacter baumannii NOT DETECTED NOT DETECTED Final   Enterobacteriaceae species NOT DETECTED NOT DETECTED Final   Enterobacter cloacae complex NOT DETECTED NOT DETECTED Final   Escherichia coli NOT DETECTED NOT DETECTED Final   Klebsiella oxytoca NOT DETECTED NOT DETECTED Final   Klebsiella pneumoniae NOT DETECTED NOT DETECTED Final   Proteus species NOT DETECTED NOT DETECTED Final   Serratia marcescens NOT DETECTED NOT  DETECTED Final   Haemophilus influenzae NOT DETECTED NOT DETECTED Final   Neisseria meningitidis NOT DETECTED NOT DETECTED Final   Pseudomonas aeruginosa NOT DETECTED NOT DETECTED Final   Candida albicans NOT DETECTED NOT DETECTED Final   Candida glabrata NOT DETECTED NOT DETECTED Final   Candida krusei NOT DETECTED NOT DETECTED Final   Candida parapsilosis NOT DETECTED NOT DETECTED Final   Candida tropicalis NOT DETECTED NOT DETECTED Final  MRSA PCR Screening     Status: None   Collection Time: 09/23/17  1:18 AM  Result Value Ref Range Status   MRSA by PCR NEGATIVE NEGATIVE Final    Comment:  The GeneXpert MRSA Assay (FDA approved for NASAL specimens only), is one component of a comprehensive MRSA colonization surveillance program. It is not intended to diagnose MRSA infection nor to guide or monitor treatment for MRSA infections.       Radiology Studies: No results found.   Scheduled Meds: . cholecalciferol  1,000 Units Oral QPM  . enoxaparin (LOVENOX) injection  30 mg Subcutaneous Daily  . feeding supplement (GLUCERNA SHAKE)  237 mL Oral TID BM  . ferrous sulfate  325 mg Oral Q breakfast  . gabapentin  100 mg Oral QHS  . insulin aspart  0-9 Units Subcutaneous TID WC  . insulin glargine  14 Units Subcutaneous QHS  . insulin glargine  3 Units Subcutaneous Daily  . loratadine  10 mg Oral Daily   Continuous Infusions: . meropenem (MERREM) IV Stopped (09/25/17 0021)     LOS: 3 days    Time spent: 30 minutes    Auri Jahnke Hoover Brunette, DO [facility] Pager [phone]  If 7PM-7AM, please contact night-coverage [email or web address] Password [email or web address] 09/25/2017, 1:02 PM

## 2017-09-25 NOTE — Progress Notes (Addendum)
Pharmacy Antibiotic Note  Javier Murillo is a 83 y.o. male admitted on 09/22/2017 with sepsis.  Pharmacy has been consulted for meropenem dosing.  Blood cultures have returned with GPC 2/2 with BCID identifying as methicillin resistant coag neg staphylocci.  Will add vancomycin   10/19  Renal: AKI on CKD - note with quadriplegia and dec muscle mass, SCr will not accurately predict renal function.  CrCl likely less than what is calculated  Patient given vancomycin 1gm x1 10/17 at 19:00 Today, 10/20  Scr=1.84 (improving  3.23>2.73>2.18>1.84)  0500 VR=12 ~ 33 hrs after 750 mg dose Pt has had total of 1750 mg. Plan:  Continue meropenem 500 mg IV q12h  Will order Vancomycin 750 mg x1 this am 0800  F/u cultures > de-escalate when appropriate  F/u scr  Height: 5\' 10"  (177.8 cm) Weight: 158 lb 14.4 oz (72.1 kg) IBW/kg (Calculated) : 73  Temp (24hrs), Avg:98.2 F (36.8 C), Min:97.9 F (36.6 C), Max:98.5 F (36.9 C)   Recent Labs Lab 09/22/17 1822 09/22/17 1842 09/22/17 2156 09/23/17 0057 09/24/17 0449 09/25/17 0500  WBC 15.2*  --   --  13.1* 12.5* 11.6*  CREATININE 3.23*  --   --  2.73* 2.18* 1.84*  LATICACIDVEN  --  1.93* 1.42  --   --   --   VANCORANDOM  --   --   --   --   --  12    Estimated Creatinine Clearance: 29.9 mL/min (A) (by C-G formula based on SCr of 1.84 mg/dL (H)).    Allergies  Allergen Reactions  . Fluvirin [Influenza Vac Split Quad] Other (See Comments)    Gave patient flu    Antimicrobials this admission: 10/17 zosyn >> x1 ED 10/17 vancomycin >> 10/17 meropenem >>  Dose adjustments this admission:   Microbiology results:  BCx: 2/2 GPC (BCID = MR-ConS)  UCx:    Sputum:    MRSA PCR: neg  Thank you for allowing pharmacy to be a part of this patient's care.  [name], Tanica Gaige R 09/25/2017 6:35 AM

## 2017-09-26 LAB — CBC
HEMATOCRIT: 33.5 % — AB (ref 39.0–52.0)
HEMOGLOBIN: 10.8 g/dL — AB (ref 13.0–17.0)
MCH: 31.1 pg (ref 26.0–34.0)
MCHC: 32.2 g/dL (ref 30.0–36.0)
MCV: 96.5 fL (ref 78.0–100.0)
Platelets: 466 10*3/uL — ABNORMAL HIGH (ref 150–400)
RBC: 3.47 MIL/uL — ABNORMAL LOW (ref 4.22–5.81)
RDW: 13.1 % (ref 11.5–15.5)
WBC: 13.3 10*3/uL — ABNORMAL HIGH (ref 4.0–10.5)

## 2017-09-26 LAB — BASIC METABOLIC PANEL
Anion gap: 8 (ref 5–15)
BUN: 36 mg/dL — ABNORMAL HIGH (ref 6–20)
CALCIUM: 9.3 mg/dL (ref 8.9–10.3)
CHLORIDE: 107 mmol/L (ref 101–111)
CO2: 27 mmol/L (ref 22–32)
CREATININE: 1.87 mg/dL — AB (ref 0.61–1.24)
GFR calc non Af Amer: 31 mL/min — ABNORMAL LOW (ref >60)
GFR, EST AFRICAN AMERICAN: 36 mL/min — AB (ref >60)
GLUCOSE: 285 mg/dL — AB (ref 65–99)
Potassium: 4.5 mmol/L (ref 3.5–5.1)
Sodium: 142 mmol/L (ref 135–145)

## 2017-09-26 LAB — GLUCOSE, CAPILLARY
GLUCOSE-CAPILLARY: 268 mg/dL — AB (ref 65–99)
Glucose-Capillary: 255 mg/dL — ABNORMAL HIGH (ref 65–99)

## 2017-09-26 MED ORDER — CEFUROXIME AXETIL 250 MG PO TABS: 250.0000 mg | ORAL_TABLET | Freq: Two times a day (BID) | ORAL | 0 refills | Status: AC

## 2017-09-26 MED ORDER — CEFUROXIME AXETIL 250 MG PO TABS: 250.0000 mg | ORAL_TABLET | Freq: Two times a day (BID) | ORAL | 0 refills | Status: DC

## 2017-09-26 NOTE — Progress Notes (Signed)
Patient had an uneventful day. Patient discharged back [facility]. Daughter in to visit and made aware of discharge plan. Patient transported to facility on stretcher. Patient discharge included medication education and call report to facility. Patient discharged stable.

## 2017-09-26 NOTE — Care Management Important Message (Signed)
Important Message  Patient Details  Name: Javier Murillo MRN: [id number] Date of Birth: [birth year]   Medicare Important Message Given:  Yes (daughter signed form)    Elliot CousinShavis, Hassell Patras Ellen, RN 09/26/2017, 11:06 AM

## 2017-09-26 NOTE — [place] FL2 (Signed)
MEDICAID FL2 LEVEL OF CARE SCREENING TOOL     IDENTIFICATION  Patient Name: Javier Murillo Birthdate: May 10, 1930 Sex: male Admission Date (Current Location): 09/22/2017  [facility] and [place] Number:  Producer, television/film/video and Address:  [facility],  [address], [place] [address]      Provider Number: [id number]  Attending Physician Name and Address:  Erick Blinks, DO  Relative Name and Phone Number:       Current Level of Care: Hospital Recommended Level of Care: Assisted Living Facility, Memory Care Prior Approval Number:    Date Approved/Denied:   PASRR Number:    Discharge Plan: Other (Comment) [facility] care)    Current Diagnoses: Patient Active Problem List   Diagnosis Date Noted  . Pressure injury of skin 09/24/2017  . Acute metabolic encephalopathy 09/23/2017  . UTI (urinary tract infection) 09/22/2017  . Type II diabetes mellitus with renal manifestations (HCC) 09/22/2017  . Chronic diastolic CHF (congestive heart failure) (HCC) 09/22/2017  . Severe protein-calorie malnutrition (HCC) 07/24/2017  . Dementia 07/23/2017  . Hypernatremia 07/23/2017  . Acute renal failure superimposed on stage 3 chronic kidney disease (HCC) 07/23/2017  . Toxic encephalopathy 07/23/2017  . Sepsis (HCC) 07/22/2017  . Aspiration pneumonia of both lower lobes due to gastric secretions (HCC) 07/22/2017  . UTI due to Klebsiella species   . Rhabdomyolysis 10/25/2016  . Elevated troponin 10/24/2016  . Acute encephalopathy 09/15/2015  . Acute respiratory failure with hypoxia (HCC) 09/15/2015  . Bradycardia 09/15/2015  . Diabetes mellitus with neuropathy (HCC) 09/15/2015  . Anemia 01/30/2015  . Quadriplegia and quadriparesis (HCC) 08/20/2014  . Gait disorder 08/20/2014  . Weakness 07/23/2014  . Diabetic neuropathy (HCC) 07/23/2014  . Acute renal failure (HCC) 07/23/2014  . HTN (hypertension) 07/23/2014  . Dyslipidemia 07/23/2014     Orientation RESPIRATION BLADDER Height & Weight     Self  Normal Incontinent Weight: 158 lb 14.4 oz (72.1 kg) Height:  5\' 10"  (177.8 cm)  BEHAVIORAL SYMPTOMS/MOOD NEUROLOGICAL BOWEL NUTRITION STATUS      Incontinent  (CCHO mechanical soft texture)  AMBULATORY STATUS COMMUNICATION OF NEEDS Skin   Extensive Assist Verbally Normal                       Personal Care Assistance Level of Assistance  Bathing, Feeding, Dressing Bathing Assistance: Maximum assistance Feeding assistance: Limited assistance Dressing Assistance: Maximum assistance     Functional Limitations Info             SPECIAL CARE FACTORS FREQUENCY                       Contractures      Additional Factors Info  Allergies, Code Status Code Status Info: full code Allergies Info: : Fluvirin Influenza Vac Split Quad           Current Medications (09/26/2017):  This is the current hospital active medication list Current Facility-Administered Medications  Medication Dose Route Frequency Provider Last Rate Last Dose  . acetaminophen (TYLENOL) tablet 500 mg  500 mg Oral Q6H PRN Lorretta Harp, MD      . cholecalciferol (VITAMIN D) tablet 1,000 Units  1,000 Units Oral QPM Lorretta Harp, MD   1,000 Units at 09/25/17 1733  . dextromethorphan-guaiFENesin (MUCINEX DM) 30-600 MG per 12 hr tablet 1 tablet  1 tablet Oral BID PRN Lorretta Harp, MD      . enoxaparin (LOVENOX) injection 30  mg  30 mg Subcutaneous Daily Lorretta HarpNiu, Xilin, MD   30 mg at 09/25/17 0910  . feeding supplement (GLUCERNA SHAKE) (GLUCERNA SHAKE) liquid 237 mL  237 mL Oral TID BM Sherryll BurgerShah, Pratik D, DO   237 mL at 09/25/17 2216  . ferrous sulfate tablet 325 mg  325 mg Oral Q breakfast Lorretta HarpNiu, Xilin, MD   325 mg at 09/25/17 0909  . gabapentin (NEURONTIN) capsule 100 mg  100 mg Oral QHS Sherryll BurgerShah, Pratik D, DO   100 mg at 09/25/17 2216  . hydrALAZINE (APRESOLINE) injection 5 mg  5 mg Intravenous Q2H PRN Lorretta HarpNiu, Xilin, MD      . insulin aspart (novoLOG) injection 0-9  Units  0-9 Units Subcutaneous TID WC Lorretta HarpNiu, Xilin, MD   2 Units at 09/25/17 1733  . insulin glargine (LANTUS) injection 14 Units  14 Units Subcutaneous QHS Lorretta HarpNiu, Xilin, MD   14 Units at 09/25/17 2217  . insulin glargine (LANTUS) injection 3 Units  3 Units Subcutaneous Daily Lorretta HarpNiu, Xilin, MD   3 Units at 09/25/17 0909  . loratadine (CLARITIN) tablet 10 mg  10 mg Oral Daily Lorretta HarpNiu, Xilin, MD   10 mg at 09/25/17 0909  . meropenem (MERREM) 500 mg in sodium chloride 0.9 % 50 mL IVPB  500 mg Intravenous Q12H [name], Elizabeth R, [facility]   Stopped at 09/26/17 0019  . ondansetron (ZOFRAN) tablet 4 mg  4 mg Oral Q6H PRN Lorretta HarpNiu, Xilin, MD       Or  . ondansetron [facility]ZOFRAN) injection 4 mg  4 mg Intravenous Q6H PRN Lorretta HarpNiu, Xilin, MD      . zolpidem (AMBIEN) tablet 5 mg  5 mg Oral QHS PRN Lorretta HarpNiu, Xilin, MD         Discharge Medications: Please see discharge summary for a list of discharge medications.  Relevant Imaging Results:  Relevant Lab Results:   Additional Information [phone]  Tykeshia Tourangeau G [place], [place]

## 2017-09-26 NOTE — Clinical Social Work Note (Signed)
SW [facility]ed [facility] [phone] before the 10am deadline.  Will follow up with family and transportation.    Elray Buba.Aislinn Feliz, LCSW [facility] Clinical Social Worker - Weekend Coverage cell #: [phone]

## 2017-09-26 NOTE — Care Management Note (Signed)
Case Management Note  Patient Details  Name: Javier Murillo MRN: [id number] Date of Birth: 1929-12-18  Subjective/Objective:     Sepsis d/t UTI, quadriplegia and quadriparesis            Action/Plan: Discharge Planning: Chart reviewed. Spoke to pt and dtr, Steward DroneBrenda at bedside. CSW following for return back [facility].   PCP Juluis RainierBARNES, ELIZABETH MD  Expected Discharge Date:  09/26/17               Expected Discharge Plan:  Assisted Living / Rest Home  In-House Referral:  Clinical Social Work  Discharge planning Services  CM Consult  Post Acute Care Choice:  NA Choice offered to:  NA  DME Arranged:  N/A DME Agency:  NA  HH Arranged:  NA HH Agency:  NA  Status of Service:  Completed, signed off  If discussed at Long Length of Stay Meetings, dates discussed:    Additional Comments:  Elliot CousinShavis, Angila Wombles Ellen, RN 09/26/2017, 10:17 AM

## 2017-09-26 NOTE — Discharge Summary (Signed)
Physician Discharge Summary  Javier Murillo WJX:[id number] DOB: 03-Apr-1930 DOA: 09/22/2017  PCP: Juluis Rainier, MD  Admit date: 09/22/2017 Discharge date: 09/26/2017  Admitted From: ALF Disposition:  ALF, memory unit  Recommendations for Outpatient Follow-up:  1. Follow up with PCP in 1-2 weeks 2. Continue Ceftin for empiric treatment of UTI for another 5 days until 10/26  Home Health:No Equipment/Devices:None  Discharge Condition:Stable CODE STATUS:Full Diet recommendation: Heart Healthy / Carb Modified/Dysphagia  Brief/Interim Summary:  Javier Murillo a 83 y.o.malewith medical history significant of dementia,quadriplegia, hypertension, hyperlipidemia, diabetes mellitus,depression, PVD, peripheral neuropathy, chronic back pain, iron deficiency anemia, dCHF, CKD-3, who presents with generalized weakness, more confusion and pus in urine. Patient has dementia andis unable to provide accurate medical history, therefore, most of the history is obtained by discussing the case with ED physician, per EMS report, and with the nursing staff. He was initially thought to have a gram positive bacteremia, but this resulted in contaminants growing in the cultures. He was on vancomycin for this, but this was then discontinued. His urine unfortunately was never collected and he did complete a 5 day course of Merrem here due to a prior resistant Klebsiella infection. He will require another 5 days of Ceftin as prescribed to complete empiric course of treatment. He has been seen by SLP with dysphagia diet that has been approved for him.  Discharge Diagnoses:  Principal Problem:   UTI (urinary tract infection) Active Problems:   HTN (hypertension)   Quadriplegia and quadriparesis (HCC)   Anemia   Sepsis (HCC)   Acute renal failure superimposed on stage 3 chronic kidney disease (HCC)   Type II diabetes mellitus with renal manifestations (HCC)   Chronic diastolic CHF (congestive heart  failure) (HCC)   Acute metabolic encephalopathy   Pressure injury of skin  Sepsis due to UTI (urinary tract infection):pt meets criteria for sepsis with leukocytosis, tachypnea, soft blood pressure. Lactic acid is normal Currently hemodynamically stable. Patient had positive urine culture with Klebsiella pneumonia on 10/25/16.   -Ceftin 250mg  BID for 5 more days to complete empiric treatment. Decision based on review of prior sensitivities.  HTN-stable: -Resume home BP meds  Anemia-Iron deficiency-stable -Continue iron supplement  AoCKD-III-resolved: Baseline Cre is 1.8 to 1.9, pt's Cre is 3.23->1.84 and BUN 60->43on admission. Likely due to prerenal secondary to dehydration and continuation of ACEI, diuretics - IVF DC on 10/19 - Follow up renal function by BMP in AM - Continue to hold lisinopril and lasix  Type II diabetes mellitus with renal manifestations:Last A1c 7.2 on 09/06/17, fairly controled. Patient is taking Toujeoat home -will decrease Toujeodose from 6-27 to 3-14 units bid -SSI -BG controlled  Chronic diastolic CHF (congestive heart failure) (HCC): 2-D echo on 10/25/16 showed EF 60-65% with grade 1 diastolic dysfunction. No leg edema or JVD. CHF is compensated. -Resume home lasix dose as renal function resolved -BNP 300; no active CHF noted  Acute metabolic encephalopathy: most likely due to [facility] for transfer back to memory floor of ALF  DM Neuropathy-improved -gabapentin started 10/18 now to qhs due to drowsiness  Discharge Instructions  Discharge Instructions    Diet - low sodium heart healthy    Complete by:  As directed    Increase activity slowly    Complete by:  As directed      Allergies as of 09/26/2017      Reactions   Fluvirin [influenza Vac Split Quad] Other (See Comments)   Gave patient flu  Medication List    STOP taking these medications   azithromycin 250 MG tablet Commonly known as:  ZITHROMAX    lisinopril 5 MG tablet Commonly known as:  PRINIVIL,ZESTRIL     TAKE these medications   acetaminophen 500 MG tablet Commonly known as:  TYLENOL Take 1 tablet (500 mg total) by mouth every 6 (six) hours as needed (pain).   amLODipine 5 MG tablet Commonly known as:  NORVASC Take 1 tablet (5 mg total) by mouth daily.   benzonatate 100 MG capsule Commonly known as:  TESSALON Take 100 mg by mouth every 8 (eight) hours as needed for cough.   cefUROXime 250 MG tablet Commonly known as:  CEFTIN Take 1 tablet (250 mg total) by mouth 2 (two) times daily.   cholecalciferol 1000 units tablet Commonly known as:  VITAMIN D Take 1,000 Units by mouth every evening.   ferrous sulfate 325 (65 FE) MG tablet Take 325 mg by mouth daily with breakfast.   furosemide 20 MG tablet Commonly known as:  LASIX Take 1 tablet (20 mg total) by mouth daily.   HUMALOG 100 UNIT/ML injection Generic drug:  insulin lispro Inject 8-11 Units into the skin daily. At 0700 per sliding scale- if BS is 0-200 = 8 units, 201-249 = 9 units, 250+= 11 units What changed:  Another medication with the same name was removed. Continue taking this medication, and follow the directions you see here.   loratadine 10 MG tablet Commonly known as:  CLARITIN Take 10 mg by mouth daily.   TOUJEO SOLOSTAR 300 UNIT/ML Sopn Generic drug:  Insulin Glargine Inject 6-25 Units into the skin 2 (two) times daily. Inject 6 units in the morning at 0700 and inject 27 units at 1700       Contact information for follow-up providers    Juluis Rainier, MD Follow up in 2 week(s).   Specialty:  Family Medicine Contact information: [address] [place] [address] [phone]            Contact information for after-discharge care    Destination    [facility] Follow up.   Specialty:  Assisted Living Facility Contact information: [address] [place]  [address] [phone]                 Allergies  Allergen Reactions  . Fluvirin [Influenza Vac Split Quad] Other (See Comments)    Gave patient flu    Consultations:  None   Procedures/Studies: Dg Chest Port 1 View  Result Date: 09/22/2017 CLINICAL DATA:  Shortness of breath EXAM: PORTABLE CHEST 1 VIEW COMPARISON:  Chest radiograph 07/23/2017 FINDINGS: The heart size and mediastinal contours are within normal limits. Both lungs are clear. The visualized skeletal structures are unremarkable. IMPRESSION: No active disease. Electronically Signed   By: Deatra Robinson M.D.   On: 09/22/2017 18:01   Discharge Exam: Vitals:   09/25/17 2108 09/26/17 0559  BP: 130/64 117/70  Pulse: 68 67  Resp: 18 18  Temp: 98 F (36.7 C) 97.6 F (36.4 C)  SpO2: 97% 98%   Vitals:   09/25/17 0640 09/25/17 1400 09/25/17 2108 09/26/17 0559  BP: 124/80 125/60 130/64 117/70  Pulse: 63 78 68 67  Resp: 18 18 18 18   Temp: 98.1 F (36.7 C) 97.8 F (36.6 C) 98 F (36.7 C) 97.6 F (36.4 C)  TempSrc: Oral Axillary Oral Oral  SpO2: 98% 97% 97% 98%  Weight:  Height:        General: Pt is alert, awake, not in acute distress; pleasantly confused Cardiovascular: RRR, S1/S2 +, no rubs, no gallops Respiratory: CTA bilaterally, no wheezing, no rhonchi Abdominal: Soft, NT, ND, bowel sounds + Extremities: no edema, no cyanosis; condom cath with clear, yellow UO    The results of significant diagnostics from this hospitalization (including imaging, microbiology, ancillary and laboratory) are listed below for reference.     Microbiology: Recent Results (from the past 240 hour(s))  Blood Culture (routine x 2)     Status: Abnormal   Collection Time: 09/22/17  6:22 PM  Result Value Ref Range Status   Specimen Description BLOOD RIGHT ANTECUBITAL  Final   Special Requests   Final    BOTTLES DRAWN AEROBIC AND ANAEROBIC Blood Culture adequate volume   Culture  Setup Time   Final    GRAM POSITIVE  COCCI IN BOTH AEROBIC AND ANAEROBIC BOTTLES CRITICAL RESULT CALLED TO, READ BACK BY AND VERIFIED WITH: D. Zeigler Pharm.D. 17:10 09/23/17 ([email or web address])    Culture (A)  Final    STAPHYLOCOCCUS CAPITIS THE SIGNIFICANCE OF ISOLATING THIS ORGANISM FROM A SINGLE SET OF BLOOD CULTURES WHEN MULTIPLE SETS ARE DRAWN IS UNCERTAIN. PLEASE NOTIFY THE MICROBIOLOGY DEPARTMENT WITHIN ONE WEEK IF SPECIATION AND SENSITIVITIES ARE REQUIRED. Performed at [facility], [address] [place], [place] [address]    Report Status 09/24/2017 FINAL  Final  Blood Culture (routine x 2)     Status: Abnormal   Collection Time: 09/22/17  6:22 PM  Result Value Ref Range Status   Specimen Description BLOOD LEFT ANTECUBITAL  Final   Special Requests   Final    BOTTLES DRAWN AEROBIC AND ANAEROBIC Blood Culture adequate volume   Culture  Setup Time   Final    GRAM POSITIVE COCCI IN BOTH AEROBIC AND ANAEROBIC BOTTLES CRITICAL VALUE NOTED.  VALUE IS CONSISTENT WITH PREVIOUSLY REPORTED AND CALLED VALUE.    Culture (A)  Final    STAPHYLOCOCCUS EPIDERMIDIS THE SIGNIFICANCE OF ISOLATING THIS ORGANISM FROM A SINGLE SET OF BLOOD CULTURES WHEN MULTIPLE SETS ARE DRAWN IS UNCERTAIN. PLEASE NOTIFY THE MICROBIOLOGY DEPARTMENT WITHIN ONE WEEK IF SPECIATION AND SENSITIVITIES ARE REQUIRED. Performed at [facility], [address], [place], [place] [address]    Report Status 09/24/2017 FINAL  Final  Blood Culture ID Panel (Reflexed)     Status: Abnormal   Collection Time: 09/22/17  6:22 PM  Result Value Ref Range Status   Enterococcus species NOT DETECTED NOT DETECTED Final   Listeria monocytogenes NOT DETECTED NOT DETECTED Final   Staphylococcus species DETECTED (A) NOT DETECTED Final    Comment: Methicillin (oxacillin) resistant coagulase negative staphylococcus. Possible blood culture contaminant (unless isolated from more than one blood culture draw or clinical case suggests pathogenicity). No antibiotic treatment is  indicated for blood  culture contaminants. CRITICAL RESULT CALLED TO, READ BACK BY AND VERIFIED WITH: D. Zeigler Pharm.D. 17:10 09/23/17 ([email or web address])    Staphylococcus aureus NOT DETECTED NOT DETECTED Final   Methicillin resistance DETECTED (A) NOT DETECTED Final    Comment: CRITICAL RESULT CALLED TO, READ BACK BY AND VERIFIED WITH: D. Zeigler Pharm.D. 17:10 09/23/17 ([email or web address])    Streptococcus species NOT DETECTED NOT DETECTED Final   Streptococcus agalactiae NOT DETECTED NOT DETECTED Final   Streptococcus pneumoniae NOT DETECTED NOT DETECTED Final   Streptococcus pyogenes NOT DETECTED NOT DETECTED Final   Acinetobacter baumannii NOT DETECTED NOT DETECTED Final   Enterobacteriaceae species NOT  DETECTED NOT DETECTED Final   Enterobacter cloacae complex NOT DETECTED NOT DETECTED Final   Escherichia coli NOT DETECTED NOT DETECTED Final   Klebsiella oxytoca NOT DETECTED NOT DETECTED Final   Klebsiella pneumoniae NOT DETECTED NOT DETECTED Final   Proteus species NOT DETECTED NOT DETECTED Final   Serratia marcescens NOT DETECTED NOT DETECTED Final   Haemophilus influenzae NOT DETECTED NOT DETECTED Final   Neisseria meningitidis NOT DETECTED NOT DETECTED Final   Pseudomonas aeruginosa NOT DETECTED NOT DETECTED Final   Candida albicans NOT DETECTED NOT DETECTED Final   Candida glabrata NOT DETECTED NOT DETECTED Final   Candida krusei NOT DETECTED NOT DETECTED Final   Candida parapsilosis NOT DETECTED NOT DETECTED Final   Candida tropicalis NOT DETECTED NOT DETECTED Final  MRSA PCR Screening     Status: None   Collection Time: 09/23/17  1:18 AM  Result Value Ref Range Status   MRSA by PCR NEGATIVE NEGATIVE Final    Comment:        The GeneXpert MRSA Assay (FDA approved for NASAL specimens only), is one component of a comprehensive MRSA colonization surveillance program. It is not intended to diagnose MRSA infection nor to guide or monitor treatment for MRSA infections.       Labs: BNP (last 3 results)  Recent Labs  09/23/17 0057  BNP 300.8*   Basic Metabolic Panel:  Recent Labs Lab 09/22/17 1822 09/23/17 0057 09/24/17 0449 09/25/17 0500 09/26/17 0526  NA 145 146* 144 145 142  K 4.3 4.2 3.8 4.0 4.5  CL 108 114* 113* 111 107  CO2 24 23 23 24 27   GLUCOSE 122* 159* 119* 144* 285*  BUN 65* 59* 43* 33* 36*  CREATININE 3.23* 2.73* 2.18* 1.84* 1.87*  CALCIUM 9.4 8.6* 8.9 9.0 9.3   Liver Function Tests:  Recent Labs Lab 09/22/17 1832  AST 51*  ALT 28  ALKPHOS 68  BILITOT 0.7  PROT 7.6  ALBUMIN 3.4*   No results for input(s): LIPASE, AMYLASE in the last 168 hours. No results for input(s): AMMONIA in the last 168 hours. CBC:  Recent Labs Lab 09/22/17 1822 09/23/17 0057 09/24/17 0449 09/25/17 0500 09/26/17 0526  WBC 15.2* 13.1* 12.5* 11.6* 13.3*  HGB 11.5* 9.8* 10.7* 10.3* 10.8*  HCT 35.5* 30.5* 34.4* 32.8* 33.5*  MCV 98.1 97.8 98.9 97.9 96.5  PLT 454* 374 413* 435* 466*   Cardiac Enzymes: No results for input(s): CKTOTAL, CKMB, CKMBINDEX, TROPONINI in the last 168 hours. BNP: Invalid input(s): POCBNP CBG:  Recent Labs Lab 09/25/17 0822 09/25/17 1151 09/25/17 1707 09/25/17 2106 09/26/17 0734  GLUCAP 158* 277* 180* 244* 255*   D-Dimer No results for input(s): DDIMER in the last 72 hours. Hgb A1c No results for input(s): HGBA1C in the last 72 hours. Lipid Profile No results for input(s): CHOL, HDL, LDLCALC, TRIG, CHOLHDL, LDLDIRECT in the last 72 hours. Thyroid function studies No results for input(s): TSH, T4TOTAL, T3FREE, THYROIDAB in the last 72 hours.  Invalid input(s): FREET3 Anemia work up No results for input(s): VITAMINB12, FOLATE, FERRITIN, TIBC, IRON, RETICCTPCT in the last 72 hours. Urinalysis    Component Value Date/Time   COLORURINE YELLOW 09/22/2017 1914   APPEARANCEUR TURBID (A) 09/22/2017 1914   LABSPEC 1.013 09/22/2017 1914   PHURINE 5.0 09/22/2017 1914   GLUCOSEU NEGATIVE 09/22/2017 1914    GLUCOSEU >=1000 (A) 06/04/2016 1618   HGBUR MODERATE (A) 09/22/2017 1914   BILIRUBINUR NEGATIVE 09/22/2017 1914   KETONESUR NEGATIVE 09/22/2017 1914   PROTEINUR 100 (  A) 09/22/2017 1914   UROBILINOGEN 0.2 06/04/2016 1618   NITRITE NEGATIVE 09/22/2017 1914   LEUKOCYTESUR LARGE (A) 09/22/2017 1914   Sepsis Labs Invalid input(s): PROCALCITONIN,  WBC,  LACTICIDVEN Microbiology Recent Results (from the past 240 hour(s))  Blood Culture (routine x 2)     Status: Abnormal   Collection Time: 09/22/17  6:22 PM  Result Value Ref Range Status   Specimen Description BLOOD RIGHT ANTECUBITAL  Final   Special Requests   Final    BOTTLES DRAWN AEROBIC AND ANAEROBIC Blood Culture adequate volume   Culture  Setup Time   Final    GRAM POSITIVE COCCI IN BOTH AEROBIC AND ANAEROBIC BOTTLES CRITICAL RESULT CALLED TO, READ BACK BY AND VERIFIED WITH: D. Zeigler Pharm.D. 17:10 09/23/17 ([email or web address])    Culture (A)  Final    STAPHYLOCOCCUS CAPITIS THE SIGNIFICANCE OF ISOLATING THIS ORGANISM FROM A SINGLE SET OF BLOOD CULTURES WHEN MULTIPLE SETS ARE DRAWN IS UNCERTAIN. PLEASE NOTIFY THE MICROBIOLOGY DEPARTMENT WITHIN ONE WEEK IF SPECIATION AND SENSITIVITIES ARE REQUIRED. Performed at [facility], [address] [place], [place] [address]    Report Status 09/24/2017 FINAL  Final  Blood Culture (routine x 2)     Status: Abnormal   Collection Time: 09/22/17  6:22 PM  Result Value Ref Range Status   Specimen Description BLOOD LEFT ANTECUBITAL  Final   Special Requests   Final    BOTTLES DRAWN AEROBIC AND ANAEROBIC Blood Culture adequate volume   Culture  Setup Time   Final    GRAM POSITIVE COCCI IN BOTH AEROBIC AND ANAEROBIC BOTTLES CRITICAL VALUE NOTED.  VALUE IS CONSISTENT WITH PREVIOUSLY REPORTED AND CALLED VALUE.    Culture (A)  Final    STAPHYLOCOCCUS EPIDERMIDIS THE SIGNIFICANCE OF ISOLATING THIS ORGANISM FROM A SINGLE SET OF BLOOD CULTURES WHEN MULTIPLE SETS ARE DRAWN IS UNCERTAIN. PLEASE NOTIFY  THE MICROBIOLOGY DEPARTMENT WITHIN ONE WEEK IF SPECIATION AND SENSITIVITIES ARE REQUIRED. Performed at [facility], [address] [place], [place] [address]    Report Status 09/24/2017 FINAL  Final  Blood Culture ID Panel (Reflexed)     Status: Abnormal   Collection Time: 09/22/17  6:22 PM  Result Value Ref Range Status   Enterococcus species NOT DETECTED NOT DETECTED Final   Listeria monocytogenes NOT DETECTED NOT DETECTED Final   Staphylococcus species DETECTED (A) NOT DETECTED Final    Comment: Methicillin (oxacillin) resistant coagulase negative staphylococcus. Possible blood culture contaminant (unless isolated from more than one blood culture draw or clinical case suggests pathogenicity). No antibiotic treatment is indicated for blood  culture contaminants. CRITICAL RESULT CALLED TO, READ BACK BY AND VERIFIED WITH: D. Zeigler Pharm.D. 17:10 09/23/17 ([email or web address])    Staphylococcus aureus NOT DETECTED NOT DETECTED Final   Methicillin resistance DETECTED (A) NOT DETECTED Final    Comment: CRITICAL RESULT CALLED TO, READ BACK BY AND VERIFIED WITH: D. Zeigler Pharm.D. 17:10 09/23/17 ([email or web address])    Streptococcus species NOT DETECTED NOT DETECTED Final   Streptococcus agalactiae NOT DETECTED NOT DETECTED Final   Streptococcus pneumoniae NOT DETECTED NOT DETECTED Final   Streptococcus pyogenes NOT DETECTED NOT DETECTED Final   Acinetobacter baumannii NOT DETECTED NOT DETECTED Final   Enterobacteriaceae species NOT DETECTED NOT DETECTED Final   Enterobacter cloacae complex NOT DETECTED NOT DETECTED Final   Escherichia coli NOT DETECTED NOT DETECTED Final   Klebsiella oxytoca NOT DETECTED NOT DETECTED Final   Klebsiella pneumoniae NOT DETECTED NOT DETECTED Final   Proteus species NOT  DETECTED NOT DETECTED Final   Serratia marcescens NOT DETECTED NOT DETECTED Final   Haemophilus influenzae NOT DETECTED NOT DETECTED Final   Neisseria meningitidis NOT DETECTED NOT DETECTED Final    Pseudomonas aeruginosa NOT DETECTED NOT DETECTED Final   Candida albicans NOT DETECTED NOT DETECTED Final   Candida glabrata NOT DETECTED NOT DETECTED Final   Candida krusei NOT DETECTED NOT DETECTED Final   Candida parapsilosis NOT DETECTED NOT DETECTED Final   Candida tropicalis NOT DETECTED NOT DETECTED Final  MRSA PCR Screening     Status: None   Collection Time: 09/23/17  1:18 AM  Result Value Ref Range Status   MRSA by PCR NEGATIVE NEGATIVE Final    Comment:        The GeneXpert MRSA Assay (FDA approved for NASAL specimens only), is one component of a comprehensive MRSA colonization surveillance program. It is not intended to diagnose MRSA infection nor to guide or monitor treatment for MRSA infections.     Time coordinating discharge: Over 30 minutes  SIGNED:   Erick BlinksPratik D Gaetana Kawahara, DO  [facility] 09/26/2017, 9:41 AM  If 7PM-7AM, please contact night-coverage [email or web address] Password [email or web address]

## 2017-09-26 NOTE — Clinical Social Work Note (Signed)
[facility].  Daughter made aware.  Elray Buba.Vauda Salvucci, LCSW [facility] Clinical Social Worker - Weekend Coverage cell #: [phone]

## 2017-09-26 NOTE — Progress Notes (Signed)
Pt discharged to SNF. Left unit on stretcher pushed by ambulance staff.  [facility]. Nurse was pretty much familiar with patient and did not require a lot of detailed information. Last medications administered with appropriate times given to nurse. No concerns voiced. Told nurse to call this writer in case of any questions or concerns . Derinda SisVera Kiva Norland,rn.

## 2017-09-26 NOTE — Care Management Important Message (Signed)
Important Message  Patient Details  Name: Javier Murillo MRN: [id number] Date of Birth: 01/20/1930   Medicare Important Message Given:   (daughter signed form)    Elliot CousinShavis, Aylla Huffine Ellen, RN 09/26/2017, 10:16 AM

## 2017-09-30 ENCOUNTER — Telehealth: Payer: Self-pay | Admitting: Endocrinology

## 2017-09-30 NOTE — Telephone Encounter (Signed)
[facility] and gave her a verbal of the insulin dosages.

## 2017-09-30 NOTE — Telephone Encounter (Signed)
Blood sugar 553 last night.  [facility] ask [facility] what the blood sugars are today.  [phone] Also confirm that there is any change in insulin doses recently

## 2017-09-30 NOTE — Telephone Encounter (Signed)
°  Dee calling [place] [facility] [place] calling for [facility]alog verbal orders  # [phone]

## 2017-09-30 NOTE — Telephone Encounter (Signed)
[facility] and spoke to [place] [facility] and she stated that when he came back from the hospital. He is on 27 units of Toujeo in the evening and 6 units in the morning and on the Humalog sliding scale in the morning only. Need Humalog sliding scale for lunch and dinner.  BS for today are:  343 at 7am 136 at noon  Please advise

## 2017-09-30 NOTE — Telephone Encounter (Signed)
For now increase the Toujeo from 27 in the evening up to 30 units  BREAKFAST Humalog doses are as follows: Blood sugar under 200 = 7 units, 200-249 = 8 units.  250+ = 9 units  LUNCH and dinner Humalog doses:  blood sugar under 200 = 10 units, 200-249 = 12 units and 250+ = 14 units

## 2017-10-04 ENCOUNTER — Encounter: Payer: Self-pay | Admitting: Endocrinology

## 2017-12-13 ENCOUNTER — Encounter: Payer: Self-pay | Admitting: Endocrinology

## 2017-12-13 ENCOUNTER — Ambulatory Visit (INDEPENDENT_AMBULATORY_CARE_PROVIDER_SITE_OTHER): Payer: Medicare Other | Admitting: Endocrinology

## 2017-12-13 VITALS — BP 140/68 | HR 79 | Ht 70.0 in | Wt 169.0 lb

## 2017-12-13 DIAGNOSIS — Z794 Long term (current) use of insulin: Secondary | ICD-10-CM

## 2017-12-13 DIAGNOSIS — E1165 Type 2 diabetes mellitus with hyperglycemia: Secondary | ICD-10-CM

## 2017-12-13 LAB — POCT GLYCOSYLATED HEMOGLOBIN (HGB A1C): Hemoglobin A1C: 6

## 2017-12-13 NOTE — Progress Notes (Addendum)
Patient ID: Javier Murillo, male   DOB: [birth year], 84 y.o.   MRN: [id number]           Reason for Appointment: Follow-up for Type 2 Diabetes  Referring physician: [name]  History of Present Illness:          Diagnosis: Type 2 diabetes mellitus, date of diagnosis:1975        Past history: He may have been treated with oral agents for the first few years of his diabetes management He thinks he has been on insulin for several years, uncertain how long. For some reason he has been continued on glyburide also which she probably has been taking for a long time Not clear if he had taken metformin or why it was stopped  He has had fair control of his diabetes over the last 2-3 years with A1c ranging from 7.4-9.3, relatively higher since 10/2013  On his initial consultation he had been on a fixed dose of Lantus along with sliding scale Humalog based on blood sugar levels before meals Because of persistently high A1c of 9% or more since 06/2014 he seen in consultation and he was started on pre-meal insulin along with correction doses His glyburide stopped because of his age and needing basal bolus insulin   Recent history:  INSULIN regimen is described as:  6 units in the morning and Toujeo 30 units in the evening   Mealtime coverage based on glucose readings: BREAKFAST doses are as follows: Blood sugar under 200 = 7 units, 200-249 = 8 units.  250+ = 9 units  LUNCH and dinner doses: With blood sugar under 200 = 10 units 200-249 = 12 units and 250+ = 14 units  He has been on a regimen of basal bolus insulin regimen as monotherapy for diabetes  His A1c is much lower than usual at 6.0, previously around 7+ However his renal function is worsening  Current blood sugar patterns and problems identified:  INSULIN doses were increased when blood sugars were as high as 558 after his hospitalization in October  Blood sugars are reviewed from his nursing home record, available only from last 6-7  days  FASTING blood sugars are lower for the last 3 days with some readings in the 80s and no significant high readings  LOWEST blood sugars are at lunchtime with frequent readings below 100 and as low as 62  Has variable readings at suppertime, twice over 200 and once below 100  Recently has a reading of 217 at bedtime and also 115, this is checked only every other day or so  No hypoglycemia reported  He reportedly is eating all his meals but rate appears to be variable   generally fairly good although there were somewhat lower about 2 weeks ago  He appears to have lost weight but his weight today was not accurate   Glucose monitoring:  done 3 times a day.  Glucometer: ?   Blood sugars as below Fasting blood sugar range 84-161 Lunchtime blood sugar range 62-149 Suppertime blood sugar range 68-240   Diet: He Has a fairly good appetite, he apparently eats whatever he is given at the nursing home including now a full peanut butter sandwich at bedtime       Self-care:     Meals: 3 meals per day. Breakfast is eggs, toast, sausage.  Lunch and dinner usually a meat or fish along with vegetables, bread and sugar-free ice cream.  Usually has sugar-free snacks  Physical activity:  none, in wheelchair         Dietician visit, most recent: Never .               Weight history:  Wt Readings from Last 3 Encounters:  12/13/17 169 lb (76.7 kg)  09/23/17 158 lb 14.4 oz (72.1 kg)  09/06/17 159 lb (72.1 kg)    Glycemic control:   Lab Results  Component Value Date   HGBA1C 6.0 12/13/2017   HGBA1C 7.2 09/06/2017   HGBA1C 7.6 (H) 07/24/2017   Lab Results  Component Value Date   MICROALBUR 1.6 06/04/2016   LDLCALC 92 04/29/2017   CREATININE 1.87 (H) 09/26/2017     Allergies as of 12/13/2017      Reactions   Fluvirin [influenza Vac Split Quad] Other (See Comments)   Gave patient flu      Medication List        Accurate as of 12/13/17  8:49 PM. Always use your most  recent med list.          acetaminophen 500 MG tablet Commonly known as:  TYLENOL Take 1 tablet (500 mg total) by mouth every 6 (six) hours as needed (pain).   amLODipine 5 MG tablet Commonly known as:  NORVASC Take 1 tablet (5 mg total) by mouth daily.   benzonatate 100 MG capsule Commonly known as:  TESSALON Take 100 mg by mouth every 8 (eight) hours as needed for cough.   cholecalciferol 1000 units tablet Commonly known as:  VITAMIN D Take 1,000 Units by mouth every evening.   ferrous sulfate 325 (65 FE) MG tablet Take 325 mg by mouth daily with breakfast.   furosemide 20 MG tablet Commonly known as:  LASIX Take 1 tablet (20 mg total) by mouth daily.   HUMALOG 100 UNIT/ML injection Generic drug:  insulin lispro Inject 8-11 Units into the skin daily. At 0700 per sliding scale- if BS is 0-200 = 8 units, 201-249 = 9 units, 250+= 11 units   loratadine 10 MG tablet Commonly known as:  CLARITIN Take 10 mg by mouth daily.   TOUJEO SOLOSTAR 300 UNIT/ML Sopn Generic drug:  Insulin Glargine Inject 6-25 Units into the skin 2 (two) times daily. Inject 6 units in the morning at 0700 and inject 27 units at 1700       Allergies:  Allergies  Allergen Reactions  . Fluvirin [Influenza Vac Split Quad] Other (See Comments)    Gave patient flu    Past Medical History:  Diagnosis Date  . Arthritis   . Chronic low back pain   . Degenerative arthritis   . Depression   . Diabetes mellitus without complication (HCC)   . Diabetic peripheral neuropathy (HCC)   . Gait disorder   . Hyperlipidemia   . Hypertension   . Kidney stones    only once  . Lumbosacral spondylosis   . Peripheral vascular disease (HCC)   . Quadriplegia and quadriparesis (HCC) 08/20/2014    Past Surgical History:  Procedure Laterality Date  . ANTERIOR CERVICAL DECOMP/DISCECTOMY FUSION N/A 08/22/2014   Procedure: ANTERIOR CERVICAL DECOMPRESSION/DISCECTOMY FUSION, cervical three-four;  Surgeon: Hewitt Shorts, MD;  Location: [facility];  Service: Neurosurgery;  Laterality: N/A;  C3-4 anterior cervical decompression with fusion plating and bonegraft  . APPENDECTOMY    . BACK SURGERY     x 7  . bone spur removed  right sholder    . bursitis Bilateral    olecranon I&D  .  CATARACT EXTRACTION W/ INTRAOCULAR LENS  IMPLANT, BILATERAL    . EYE SURGERY     bilateral cataracts  . LUMBAR SPINE SURGERY     x7  . SHOULDER SURGERY Bilateral     Family History  Problem Relation Age of Onset  . Cancer Mother   . Heart attack Father   . Dementia Sister     Social History:  reports that he quit smoking about 56 years ago. His smoking use included cigarettes. He has a 1.25 pack-year smoking history. he has never used smokeless tobacco. He reports that he does not drink alcohol or use drugs.    Review of Systems     RENAL function has been consistently was for roughly 6 months, last followed up by PCP in November Probably not seen by nephrologist  Lab Results  Component Value Date   CREATININE 1.87 (H) 09/26/2017   CREATININE 1.84 (H) 09/25/2017   CREATININE 2.18 (H) 09/24/2017         Lipids: not on any medication for hyperlipidemia, LDL is well controlled       Lab Results  Component Value Date   CHOL 167 04/29/2017   HDL 57.90 04/29/2017   LDLCALC 92 04/29/2017   TRIG 87.0 04/29/2017   CHOLHDL 3 04/29/2017               He Has chronic weakness in his legs from central cord problems previously and not able to ambulate  History of leg edema recently better   Physical Examination:  BP 140/68   Pulse 79   Ht 5\' 10"  (1.778 m)   Wt 169 lb (76.7 kg)   SpO2 95%   BMI 24.25 kg/m   No significant ankle edema  ASSESSMENT:  Diabetes type 2, uncontrolled  See history of present illness for detailed discussion of his current management, blood sugar patterns and problems identified  He is on a basal bolus insulin regimen with Toujeo, getting only a smaller dose of  Toujeo in the morning and 30 units in the evening With his renal dysfunction he appears to have much lower blood sugars and A1c compared to before A1c is probably falsely low at 6%  He has only a few readings available for review and appears to have relatively low readings at lunchtime and now also getting readings below 100 in the morning without hypoglycemia Difficult to assess his diet as he is not a good historian  PLAN:  Will reduce evening Toujeo by 4 units Also reduce breakfast Humalog coverage by 2 units across the board Will review blood sugars by fax in about 3 weeks and sent orders if needed Written instructions given for nursing home Check CMP today   Patient Instructions   Toujeo 26 units in the evening, 6 U in am   Mealtime coverage based on glucose readings: BREAKFAST Humalog doses are as follows: Blood sugar under 200 = 5 units, 200-249 = 6 units.  250 or more = 7 units  Document sugar at bedtime every other day and fax to me  FAX SUGAR READINGS ON MAR IN 3 WEEKS [phone]        Reather Littler 12/13/2017, 8:49 PM   Note: This office note was prepared with Dragon voice recognition system technology. Any transcriptional errors that result from this process are unintentional. Sleep

## 2017-12-13 NOTE — Patient Instructions (Signed)
Toujeo 26 units in the evening, 6 U in am   Mealtime coverage based on glucose readings: BREAKFAST Humalog doses are as follows: Blood sugar under 200 = 5 units, 200-249 = 6 units.  250 or more = 7 units  Document sugar at bedtime every other day and fax to me  FAX SUGAR READINGS ON MAR IN 3 WEEKS [phone]

## 2017-12-14 LAB — COMPREHENSIVE METABOLIC PANEL
ALBUMIN: 3.9 g/dL (ref 3.5–5.2)
ALK PHOS: 64 U/L (ref 39–117)
ALT: 12 U/L (ref 0–53)
AST: 20 U/L (ref 0–37)
BILIRUBIN TOTAL: 0.3 mg/dL (ref 0.2–1.2)
BUN: 31 mg/dL — ABNORMAL HIGH (ref 6–23)
CALCIUM: 9.3 mg/dL (ref 8.4–10.5)
CHLORIDE: 102 meq/L (ref 96–112)
CO2: 31 mEq/L (ref 19–32)
CREATININE: 1.88 mg/dL — AB (ref 0.40–1.50)
GFR: 36.38 mL/min — ABNORMAL LOW (ref >60.00)
Glucose, Bld: 137 mg/dL — ABNORMAL HIGH (ref 70–99)
Potassium: 4.5 mEq/L (ref 3.5–5.1)
SODIUM: 138 meq/L (ref 135–145)
TOTAL PROTEIN: 6.4 g/dL (ref 6.0–8.3)

## 2018-12-07 DIAGNOSIS — N390 Urinary tract infection, site not specified: Secondary | ICD-10-CM

## 2018-12-07 HISTORY — DX: Urinary tract infection, site not specified: N39.0

## 2018-12-12 ENCOUNTER — Emergency Department (HOSPITAL_COMMUNITY): Payer: Medicare Other

## 2018-12-12 ENCOUNTER — Encounter (HOSPITAL_COMMUNITY): Payer: Self-pay | Admitting: Emergency Medicine

## 2018-12-12 ENCOUNTER — Other Ambulatory Visit: Payer: Self-pay

## 2018-12-12 ENCOUNTER — Emergency Department (HOSPITAL_COMMUNITY)
Admission: EM | Admit: 2018-12-12 | Discharge: 2018-12-12 | Disposition: A | Discharge status: Skilled Nursing Facility | Payer: Medicare Other | Attending: Emergency Medicine | Admitting: Emergency Medicine

## 2018-12-12 DIAGNOSIS — I110 Hypertensive heart disease with heart failure: Secondary | ICD-10-CM | POA: Diagnosis not present

## 2018-12-12 DIAGNOSIS — R55 Syncope and collapse: Secondary | ICD-10-CM | POA: Diagnosis present

## 2018-12-12 DIAGNOSIS — Z79899 Other long term (current) drug therapy: Secondary | ICD-10-CM | POA: Diagnosis not present

## 2018-12-12 DIAGNOSIS — E119 Type 2 diabetes mellitus without complications: Secondary | ICD-10-CM | POA: Insufficient documentation

## 2018-12-12 DIAGNOSIS — Z87891 Personal history of nicotine dependence: Secondary | ICD-10-CM | POA: Insufficient documentation

## 2018-12-12 DIAGNOSIS — F0390 Unspecified dementia without behavioral disturbance: Secondary | ICD-10-CM | POA: Insufficient documentation

## 2018-12-12 DIAGNOSIS — E162 Hypoglycemia, unspecified: Secondary | ICD-10-CM | POA: Insufficient documentation

## 2018-12-12 DIAGNOSIS — I5032 Chronic diastolic (congestive) heart failure: Secondary | ICD-10-CM | POA: Insufficient documentation

## 2018-12-12 LAB — CBC WITH DIFFERENTIAL/PLATELET
Abs Immature Granulocytes: 0.04 10*3/uL (ref 0.00–0.07)
Basophils Absolute: 0.1 10*3/uL (ref 0.0–0.1)
Basophils Relative: 1 %
Eosinophils Absolute: 0.1 10*3/uL (ref 0.0–0.5)
Eosinophils Relative: 1 %
HCT: 38.5 % — ABNORMAL LOW (ref 39.0–52.0)
Hemoglobin: 12.1 g/dL — ABNORMAL LOW (ref 13.0–17.0)
Immature Granulocytes: 0 %
Lymphocytes Relative: 13 %
Lymphs Abs: 1.4 10*3/uL (ref 0.7–4.0)
MCH: 31.1 pg (ref 26.0–34.0)
MCHC: 31.4 g/dL (ref 30.0–36.0)
MCV: 99 fL (ref 80.0–100.0)
Monocytes Absolute: 0.9 10*3/uL (ref 0.1–1.0)
Monocytes Relative: 8 %
NEUTROS PCT: 77 %
Neutro Abs: 8.3 10*3/uL — ABNORMAL HIGH (ref 1.7–7.7)
PLATELETS: 315 10*3/uL (ref 150–400)
RBC: 3.89 MIL/uL — ABNORMAL LOW (ref 4.22–5.81)
RDW: 12.8 % (ref 11.5–15.5)
WBC: 10.7 10*3/uL — ABNORMAL HIGH (ref 4.0–10.5)
nRBC: 0 % (ref 0.0–0.2)

## 2018-12-12 LAB — AMMONIA: Ammonia: 22 umol/L (ref 9–35)

## 2018-12-12 LAB — CBG MONITORING, ED
Glucose-Capillary: 57 mg/dL — ABNORMAL LOW (ref 70–99)
Glucose-Capillary: 140 mg/dL — ABNORMAL HIGH (ref 70–99)
Glucose-Capillary: 136 mg/dL — ABNORMAL HIGH (ref 70–99)

## 2018-12-12 LAB — COMPREHENSIVE METABOLIC PANEL
ALT: 10 U/L (ref 0–44)
AST: 21 U/L (ref 15–41)
Albumin: 3.4 g/dL — ABNORMAL LOW (ref 3.5–5.0)
Alkaline Phosphatase: 53 U/L (ref 38–126)
Anion gap: 7 (ref 5–15)
BUN: 24 mg/dL — ABNORMAL HIGH (ref 8–23)
CHLORIDE: 106 mmol/L (ref 98–111)
CO2: 26 mmol/L (ref 22–32)
Calcium: 8.6 mg/dL — ABNORMAL LOW (ref 8.9–10.3)
Creatinine, Ser: 1.59 mg/dL — ABNORMAL HIGH (ref 0.61–1.24)
GFR calc Af Amer: 45 mL/min — ABNORMAL LOW (ref >60)
GFR calc non Af Amer: 39 mL/min — ABNORMAL LOW (ref >60)
Glucose, Bld: 76 mg/dL (ref 70–99)
Potassium: 3.9 mmol/L (ref 3.5–5.1)
Sodium: 139 mmol/L (ref 135–145)
Total Bilirubin: 0.6 mg/dL (ref 0.3–1.2)
Total Protein: 5.9 g/dL — ABNORMAL LOW (ref 6.5–8.1)

## 2018-12-12 LAB — I-STAT CHEM 8, ED
BUN: 23 mg/dL (ref 8–23)
Calcium, Ion: 1.16 mmol/L (ref 1.15–1.40)
Chloride: 105 mmol/L (ref 98–111)
Creatinine, Ser: 1.6 mg/dL — ABNORMAL HIGH (ref 0.61–1.24)
Glucose, Bld: 72 mg/dL (ref 70–99)
HCT: 36 % — ABNORMAL LOW (ref 39.0–52.0)
HEMOGLOBIN: 12.2 g/dL — AB (ref 13.0–17.0)
Potassium: 3.8 mmol/L (ref 3.5–5.1)
Sodium: 139 mmol/L (ref 135–145)
TCO2: 26 mmol/L (ref 22–32)

## 2018-12-12 LAB — BLOOD GAS, VENOUS
Acid-Base Excess: 3.1 mmol/L — ABNORMAL HIGH (ref 0.0–2.0)
Bicarbonate: 27.8 mmol/L (ref 20.0–28.0)
O2 Saturation: 70.6 %
Patient temperature: 98.6
pCO2, Ven: 45.2 mmHg (ref 44.0–60.0)
pH, Ven: 7.406 (ref 7.250–7.430)
pO2, Ven: 38.2 mmHg (ref 32.0–45.0)

## 2018-12-12 LAB — ETHANOL: Alcohol, Ethyl (B): <10 mg/dL (ref <10)

## 2018-12-12 LAB — URINALYSIS, ROUTINE W REFLEX MICROSCOPIC
Bilirubin Urine: NEGATIVE
Glucose, UA: 50 mg/dL — AB
Ketones, ur: NEGATIVE mg/dL
Nitrite: NEGATIVE
Protein, ur: NEGATIVE mg/dL
SPECIFIC GRAVITY, URINE: 1.008 (ref 1.005–1.030)
pH: 6 (ref 5.0–8.0)

## 2018-12-12 LAB — I-STAT CG4 LACTIC ACID, ED: Lactic Acid, Venous: 1.01 mmol/L (ref 0.5–1.9)

## 2018-12-12 MED ORDER — SODIUM CHLORIDE 0.9 % IV BOLUS
1000.0000 mL | Freq: Once | INTRAVENOUS | Status: AC
  Administered 2018-12-12: 1000 mL via INTRAVENOUS

## 2018-12-12 MED ORDER — DEXTROSE 50 % IV SOLN
1.0000 | Freq: Once | INTRAVENOUS | Status: AC
  Administered 2018-12-12: 50 mL via INTRAVENOUS
  Filled 2018-12-12: qty 50

## 2018-12-12 NOTE — Discharge Instructions (Addendum)
See your endocrinologist tomorrow.  Take 1/2 of your evening insulin(toujeo(15U)) tonight.

## 2018-12-12 NOTE — ED Notes (Signed)
Discharge paperwork reviewed with patients daughter, who expressed that pt has endocrinologist appt scheduled for tomorrow.  No needs expressed. [facility] notified of need for transport back to facility.

## 2018-12-12 NOTE — ED Provider Notes (Addendum)
[facility] Note   CSN: [id number] Arrival date & time: 12/12/18  [id number]     History   Chief Complaint Chief Complaint  Patient presents with  . Near Syncope    HPI Javier Murillo is a 85 y.o. male.  85 yo M with possible syncopal episode.  Per the nursing home staff he was sitting in a wheelchair and lost consciousness.  They deny any injury.  Then transferred him here for evaluation.  Deny recent illness.  Patient is oriented only to person at baseline.  Level 5 caveat dementia.  The history is provided by the patient.  Near Syncope  This is a new problem. The current episode started less than 1 hour ago. The problem occurs rarely. The problem has been resolved. Pertinent negatives include no chest pain, no abdominal pain, no headaches and no shortness of breath. Nothing aggravates the symptoms. Nothing relieves the symptoms. He has tried nothing for the symptoms. The treatment provided no relief.    Past Medical History:  Diagnosis Date  . Arthritis   . Chronic low back pain   . Degenerative arthritis   . Depression   . Diabetes mellitus without complication (HCC)   . Diabetic peripheral neuropathy (HCC)   . Gait disorder   . Hyperlipidemia   . Hypertension   . Kidney stones    only once  . Lumbosacral spondylosis   . Peripheral vascular disease (HCC)   . Quadriplegia and quadriparesis (HCC) 08/20/2014    Patient Active Problem List   Diagnosis Date Noted  . Pressure injury of skin 09/24/2017  . Acute metabolic encephalopathy 09/23/2017  . UTI (urinary tract infection) 09/22/2017  . Type II diabetes mellitus with renal manifestations (HCC) 09/22/2017  . Chronic diastolic CHF (congestive heart failure) (HCC) 09/22/2017  . Severe protein-calorie malnutrition (HCC) 07/24/2017  . Dementia (HCC) 07/23/2017  . Hypernatremia 07/23/2017  . Acute renal failure superimposed on stage 3 chronic kidney disease (HCC) 07/23/2017  .  Toxic encephalopathy 07/23/2017  . Sepsis (HCC) 07/22/2017  . Aspiration pneumonia of both lower lobes due to gastric secretions (HCC) 07/22/2017  . UTI due to Klebsiella species   . Rhabdomyolysis 10/25/2016  . Elevated troponin 10/24/2016  . Acute encephalopathy 09/15/2015  . Acute respiratory failure with hypoxia (HCC) 09/15/2015  . Bradycardia 09/15/2015  . Diabetes mellitus with neuropathy (HCC) 09/15/2015  . Anemia 01/30/2015  . Quadriplegia and quadriparesis (HCC) 08/20/2014  . Gait disorder 08/20/2014  . Weakness 07/23/2014  . Diabetic neuropathy (HCC) 07/23/2014  . Acute renal failure (HCC) 07/23/2014  . HTN (hypertension) 07/23/2014  . Dyslipidemia 07/23/2014    Past Surgical History:  Procedure Laterality Date  . ANTERIOR CERVICAL DECOMP/DISCECTOMY FUSION N/A 08/22/2014   Procedure: ANTERIOR CERVICAL DECOMPRESSION/DISCECTOMY FUSION, cervical three-four;  Surgeon: Hewitt Shortsobert W Nudelman, MD;  Location: [facility];  Service: Neurosurgery;  Laterality: N/A;  C3-4 anterior cervical decompression with fusion plating and bonegraft  . APPENDECTOMY    . BACK SURGERY     x 7  . bone spur removed  right sholder    . bursitis Bilateral    olecranon I&D  . CATARACT EXTRACTION W/ INTRAOCULAR LENS  IMPLANT, BILATERAL    . EYE SURGERY     bilateral cataracts  . LUMBAR SPINE SURGERY     x7  . SHOULDER SURGERY Bilateral         Home Medications    Prior to Admission medications   Medication Sig Start Date End  Date Taking? Authorizing Provider  amLODipine (NORVASC) 5 MG tablet Take 1 tablet (5 mg total) by mouth daily. 09/17/15  Yes Meredeth Ide, MD  cholecalciferol (VITAMIN D) 1000 UNITS tablet Take 1,000 Units by mouth every evening.    Yes [provider]  ferrous sulfate 325 (65 FE) MG tablet Take 325 mg by mouth daily with breakfast.   Yes [provider]  furosemide (LASIX) 20 MG tablet Take 1 tablet (20 mg total) by mouth daily. Patient taking  differently: Take 10 mg by mouth daily.  09/17/15  Yes Lama, Sarina Ill, MD  HUMALOG 100 UNIT/ML injection Inject 7-14 Units into the skin daily. In the morning-80-200=7 units, 201-249=8 units, 250+=9 units/2 times daily-80-200=10 units, 201-249=12 units, 250+=14 units 03/26/15  Yes [provider]  sertraline (ZOLOFT) 25 MG tablet Take 25 mg by mouth daily.   Yes [provider]  TOUJEO SOLOSTAR 300 UNIT/ML SOPN Inject 6-25 Units into the skin 2 (two) times daily. Inject 6 units in the morning at 0700 and inject 30 units at 1700 10/16/16  Yes [provider]  acetaminophen (TYLENOL) 500 MG tablet Take 1 tablet (500 mg total) by mouth every 6 (six) hours as needed (pain). 10/27/16   Tyrone Nine, MD    Family History Family History  Problem Relation Age of Onset  . Cancer Mother   . Heart attack Father   . Dementia Sister     Social History Social History   Tobacco Use  . Smoking status: Former Smoker    Packs/day: 0.25    Years: 5.00    Pack years: 1.25    Types: Cigarettes    Last attempt to quit: 07/22/1961    Years since quitting: 57.4  . Smokeless tobacco: Never Used  . Tobacco comment: less than 1/4 pack per week  Substance Use Topics  . Alcohol use: No  . Drug use: No     Allergies   Fluvirin [influenza vac split quad]   Review of Systems Review of Systems  Constitutional: Negative for chills and fever.  HENT: Negative for congestion and facial swelling.   Eyes: Negative for discharge and visual disturbance.  Respiratory: Negative for shortness of breath.   Cardiovascular: Positive for near-syncope. Negative for chest pain and palpitations.  Gastrointestinal: Negative for abdominal pain, diarrhea and vomiting.  Musculoskeletal: Negative for arthralgias and myalgias.  Skin: Negative for color change and rash.  Neurological: Positive for syncope. Negative for tremors and headaches.  Psychiatric/Behavioral: Negative for confusion and  dysphoric mood.     Physical Exam Updated Vital Signs BP 137/69   Pulse (!) 55   Temp (S) 97.6 F (36.4 C) (Rectal)   Resp 15   SpO2 95%   Physical Exam Vitals signs and nursing note reviewed.  Constitutional:      Appearance: He is well-developed.  HENT:     Head: Normocephalic and atraumatic.  Eyes:     Pupils: Pupils are equal, round, and reactive to light.  Neck:     Musculoskeletal: Normal range of motion and neck supple.     Vascular: No JVD.  Cardiovascular:     Rate and Rhythm: Normal rate and regular rhythm.     Heart sounds: No murmur. No friction rub. No gallop.   Pulmonary:     Effort: No respiratory distress.     Breath sounds: No wheezing.  Abdominal:     General: There is no distension.     Tenderness: There is no  guarding or rebound.  Musculoskeletal: Normal range of motion.  Skin:    Coloration: Skin is not pale.     Findings: No rash.  Neurological:     Mental Status: He is alert.      ED Treatments / Results  Labs (all labs ordered are listed, but only abnormal results are displayed) Labs Reviewed  COMPREHENSIVE METABOLIC PANEL - Abnormal; Notable for the following components:      Result Value   BUN 24 (*)    Creatinine, Ser 1.59 (*)    Calcium 8.6 (*)    Total Protein 5.9 (*)    Albumin 3.4 (*)    GFR calc non Af Amer 39 (*)    GFR calc Af Amer 45 (*)    All other components within normal limits  CBC WITH DIFFERENTIAL/PLATELET - Abnormal; Notable for the following components:   WBC 10.7 (*)    RBC 3.89 (*)    Hemoglobin 12.1 (*)    HCT 38.5 (*)    Neutro Abs 8.3 (*)    All other components within normal limits  URINALYSIS, ROUTINE W REFLEX MICROSCOPIC - Abnormal; Notable for the following components:   APPearance HAZY (*)    Glucose, UA 50 (*)    Hgb urine dipstick SMALL (*)    Leukocytes, UA LARGE (*)    Bacteria, UA RARE (*)    All other components within normal limits  BLOOD GAS, VENOUS - Abnormal; Notable for the  following components:   Acid-Base Excess 3.1 (*)    All other components within normal limits  I-STAT CHEM 8, ED - Abnormal; Notable for the following components:   Creatinine, Ser 1.60 (*)    Hemoglobin 12.2 (*)    HCT 36.0 (*)    All other components within normal limits  CBG MONITORING, ED - Abnormal; Notable for the following components:   Glucose-Capillary 57 (*)    All other components within normal limits  CBG MONITORING, ED - Abnormal; Notable for the following components:   Glucose-Capillary 140 (*)    All other components within normal limits  CULTURE, BLOOD (ROUTINE X 2)  CULTURE, BLOOD (ROUTINE X 2)  URINE CULTURE  AMMONIA  ETHANOL  I-STAT CG4 LACTIC ACID, ED  CBG MONITORING, ED    EKG EKG Interpretation  Date/Time:  Monday December 12 2018 10:45:32 EST Ventricular Rate:  60 PR Interval:    QRS Duration: 113 QT Interval:  444 QTC Calculation: 444 R Axis:   -11 Text Interpretation:  Sinus rhythm Incomplete left bundle branch block Inferior infarct, old sinus arrythmia resolved  non specific st changes resolved Otherwise no significant change Confirmed by Melene Plan [phone]) on 12/12/2018 11:06:31 AM   Radiology Dg Chest 1 View  Result Date: 12/12/2018 CLINICAL DATA:  Altered mental status. EXAM: CHEST  1 VIEW COMPARISON:  Chest x-ray dated 09/22/2017 FINDINGS: The heart size and pulmonary vascularity are normal. There is minimal atelectasis at the lung bases, improved since the prior study. No infiltrates or effusions. No acute bone abnormality. Aortic atherosclerosis. IMPRESSION: 1. Minimal bibasilar atelectasis, improved since the prior study. 2. Lungs otherwise clear. 3.  Aortic Atherosclerosis (ICD10-I70.0). Electronically Signed   By: Francene Boyers M.D.   On: 12/12/2018 10:15   Ct Head Wo Contrast  Result Date: 12/12/2018 CLINICAL DATA:  Recent syncopal episode EXAM: CT HEAD WITHOUT CONTRAST TECHNIQUE: Contiguous axial images were obtained from the base of the  skull through the vertex without intravenous contrast. COMPARISON:  10/24/2016 FINDINGS:  Brain: Chronic atrophic and ischemic changes are seen. These are stable from the prior exam. No findings to suggest acute hemorrhage, acute infarction or space-occupying mass lesion are noted. Vascular: No hyperdense vessel or unexpected calcification. Skull: Normal. Negative for fracture or focal lesion. Sinuses/Orbits: No acute finding. Other: None. IMPRESSION: Chronic atrophic and ischemic changes without acute abnormality. Electronically Signed   By: Alcide Clever M.D.   On: 12/12/2018 11:32    Procedures Procedures (including critical care time)  Medications Ordered in ED Medications  sodium chloride 0.9 % bolus 1,000 mL (0 mLs Intravenous Stopped 12/12/18 1208)  dextrose 50 % solution 50 mL (50 mLs Intravenous Given 12/12/18 1135)  sodium chloride 0.9 % bolus 1,000 mL (0 mLs Intravenous Stopped 12/12/18 1318)     Initial Impression / Assessment and Plan / ED Course  I have reviewed the triage vital signs and the nursing notes.  Pertinent labs & imaging results that were available during my care of the patient were reviewed by me and considered in my medical decision making (see chart for details).     85 yo M with a chief complaint of a possible syncopal event.  Apparently occurred while he was seated in a wheelchair.  No known trauma per the nursing home.  Patient initially on my exam was somewhat sleepy though he woke spontaneously within the first 30 minutes of him being in the ED.  I wonder if he was sleeping during this event.  Will obtain an altered mental status work-up as he is unable to provide any history.  The patient became hypoglycemic into the 50s given an amp of D50 with improvement.  Mental status is remained about the same.  Family arrived for further history patient over the past few days has had hypoglycemia in the morning.  I suspect that his long-acting insulin is at too high level.  We  will give the patient a diet and observe in the ED to ensure that he remains normoglycemic.  His alcohol level is negative.  ABG without hypercarbia.  Creatinine is actually better than his baseline at 1.59.  CT of the head was negative for acute intracranial pathology.  Chest x-ray viewed by me without focal infiltrate.the patient has not yet urinated nursing was unable to perform in and out cath.  Patient's urine with some white cells but rare bacteria.  We will send for culture.  We will have the patient have his evening dose of his long-acting insulin and he has scheduled an appointment to see his endocrinologist tomorrow.  CRITICAL CARE Performed by: Rae Roam   Total critical care time: 35 minutes  Critical care time was exclusive of separately billable procedures and treating other patients.  Critical care was necessary to treat or prevent imminent or life-threatening deterioration.  Critical care was time spent personally by me on the following activities: development of treatment plan with patient and/or surrogate as well as nursing, discussions with consultants, evaluation of patient's response to treatment, examination of patient, obtaining history from patient or surrogate, ordering and performing treatments and interventions, ordering and review of laboratory studies, ordering and review of radiographic studies, pulse oximetry and re-evaluation of patient's condition.   2:31 PM:  I have discussed the diagnosis/risks/treatment options with the patient and family and believe the pt to be eligible for discharge home to follow-up with PCP. We also discussed returning to the ED immediately if new or worsening sx occur. We discussed the sx which are most concerning (e.g.,  sudden worsening pain, fever, inability to tolerate by mouth) that necessitate immediate return. Medications administered to the patient during their visit and any new prescriptions provided to the patient are  listed below.  Medications given during this visit Medications  sodium chloride 0.9 % bolus 1,000 mL (0 mLs Intravenous Stopped 12/12/18 1208)  dextrose 50 % solution 50 mL (50 mLs Intravenous Given 12/12/18 1135)  sodium chloride 0.9 % bolus 1,000 mL (0 mLs Intravenous Stopped 12/12/18 1318)      The patient appears reasonably screen and/or stabilized for discharge and I doubt any other medical condition or other [facility] requiring further screening, evaluation, or treatment in the ED at this time prior to discharge.     Final Clinical Impressions(s) / ED Diagnoses   Final diagnoses:  Hypoglycemia    ED Discharge Orders    None       Melene PlanFloyd, Shellyann Wandrey, DO 12/12/18 1432    Melene PlanFloyd, Jarrette Dehner, DO 12/19/18 1045

## 2018-12-12 NOTE — ED Notes (Signed)
Report given to receiving nurse at [facility].

## 2018-12-12 NOTE — ED Notes (Signed)
This RN attempted In and Out catheterization on pt, met with a lot of resistance to catheter.  MD made aware, condom cath applied.

## 2018-12-12 NOTE — ED Triage Notes (Signed)
Per EMS pt syncope in wheelchair and was eased to ground per staff. Hx of dementia; alert to person per normal.

## 2018-12-12 NOTE — ED Notes (Signed)
X-ray at bedside

## 2018-12-12 NOTE — Progress Notes (Signed)
Patient ID: Javier Murillo, male   DOB: 05/26/1930, 85 y.o.   MRN: [id number]           Reason for Appointment: Follow-up for Type 2 Diabetes  Referring physician: Zachery Murillo  History of Present Illness:          Diagnosis: Type 2 diabetes mellitus, date of diagnosis:1975        Past history: He may have been treated with oral agents for the first few years of his diabetes management He thinks he has been on insulin for several years, uncertain how long. For some reason he has been continued on glyburide also which she probably has been taking for a long time Not clear if he had taken metformin or why it was stopped  He has had fair control of his diabetes over the last 2-3 years with A1c ranging from 7.4-9.3, relatively higher since 10/2013  On his initial consultation he had been on a fixed dose of Lantus along with sliding scale Humalog based on blood sugar levels before meals Because of persistently high A1c of 9% or more since 06/2014 he seen in consultation and he was started on pre-meal insulin along with correction doses His glyburide stopped because of his age and needing basal bolus insulin   Recent history:  INSULIN regimen is described as:  6 units in the morning and Toujeo 30 units in the evening   Mealtime coverage based on glucose readings: BREAKFAST doses are as follows: Blood sugar under 200 = 7 units, 200-249 = 8 units.  250+ = 9 units  LUNCH and dinner doses: With blood sugar under 200 = 10 units 200-249 = 12 units and 250+ = 14 units  He has been on a regimen of basal bolus insulin regimen as monotherapy for diabetes  His A1c is lower previously at 5.4, was 6%  He has not been seen in follow-up for a year  Current blood sugar patterns and problems identified:  His Toujeo was supposed to be reduced to 26 units on the last visit but nursing home records still indicate the same dose  He was seen at the emergency room for probable hypoglycemia resolved causing  alteration in sensorium yesterday morning  However he appears to have had hypoglycemia with blood sugars as low as 48 in the morning  Apparently this had not been reported by the nursing home staff  Not clear what brand of blood sugar monitor is being used  However blood sugars the rest of the day appear to be usually fairly good with only 1 low reading of 60 at lunchtime  He still gets the HUMALOG at mealtimes based on his blood sugar levels  His son the says that he is eating all his meals usually  Difficult to assess his weight but his weight has not gone down  He still getting sugar monitoring done about 3 times a day with occasional readings at bedtime   Glucose monitoring:  done 3 times a day.  Glucometer: ?    Reviewed from the last week or so  PRE-MEAL Fasting Lunch Dinner Bedtime Overall  Glucose range:  48-108  60-193  79-201    Mean/median:        POST-MEAL PC Breakfast PC Lunch PC Dinner  Glucose range:    143, 154  Mean/median:       Diet: He still has a fairly good appetite, he apparently eats whatever he is given at the nursing home including a full peanut butter  sandwich at bedtime       Self-care:     Meals: 3 meals per day. Breakfast is eggs, toast, sausage.  Lunch and dinner usually a meat or fish along with vegetables, bread and sugar-free ice cream.  Usually has sugar-free snacks           Physical activity:  none, in wheelchair         Dietician visit, most recent: Never .               Weight history:  Wt Readings from Last 3 Encounters:  12/13/17 169 lb (76.7 kg)  09/23/17 158 lb 14.4 oz (72.1 kg)  09/06/17 159 lb (72.1 kg)    Glycemic control:   Lab Results  Component Value Date   HGBA1C 5.4 12/13/2018   HGBA1C 6.0 12/13/2017   HGBA1C 7.2 09/06/2017   Lab Results  Component Value Date   MICROALBUR 1.6 06/04/2016   LDLCALC 92 04/29/2017   CREATININE 1.60 (H) 12/12/2018     Allergies as of 12/13/2018      Reactions   Fluvirin  [influenza Vac Split Quad] Other (See Comments)   Gave patient flu      Medication List       Accurate as of December 13, 2018  3:15 PM. Always use your most recent med list.        acetaminophen 500 MG tablet Commonly known as:  TYLENOL Take 1 tablet (500 mg total) by mouth every 6 (six) hours as needed (pain).   amLODipine 5 MG tablet Commonly known as:  NORVASC Take 1 tablet (5 mg total) by mouth daily.   cholecalciferol 1000 units tablet Commonly known as:  VITAMIN D Take 1,000 Units by mouth every evening.   ferrous sulfate 325 (65 FE) MG tablet Take 325 mg by mouth daily with breakfast.   furosemide 20 MG tablet Commonly known as:  LASIX Take 1 tablet (20 mg total) by mouth daily.   HUMALOG 100 UNIT/ML injection Generic drug:  insulin lispro Inject 7-14 Units into the skin daily. In the morning-80-200=7 units, 201-249=8 units, 250+=9 units/2 times daily-80-200=10 units, 201-249=12 units, 250+=14 units   sertraline 25 MG tablet Commonly known as:  ZOLOFT Take 25 mg by mouth daily.   TOUJEO SOLOSTAR 300 UNIT/ML Sopn Generic drug:  Insulin Glargine (1 Unit Dial) Inject 30 Units into the skin daily. Inject 30 units under the skin once daily in the evening.       Allergies:  Allergies  Allergen Reactions  . Fluvirin [Influenza Vac Split Quad] Other (See Comments)    Gave patient flu    Past Medical History:  Diagnosis Date  . Arthritis   . Chronic low back pain   . Degenerative arthritis   . Depression   . Diabetes mellitus without complication (HCC)   . Diabetic peripheral neuropathy (HCC)   . Gait disorder   . Hyperlipidemia   . Hypertension   . Kidney stones    only once  . Lumbosacral spondylosis   . Peripheral vascular disease (HCC)   . Quadriplegia and quadriparesis (HCC) 08/20/2014    Past Surgical History:  Procedure Laterality Date  . ANTERIOR CERVICAL DECOMP/DISCECTOMY FUSION N/A 08/22/2014   Procedure: ANTERIOR CERVICAL  DECOMPRESSION/DISCECTOMY FUSION, cervical three-four;  Surgeon: Hewitt Shorts, MD;  Location: [facility];  Service: Neurosurgery;  Laterality: N/A;  C3-4 anterior cervical decompression with fusion plating and bonegraft  . APPENDECTOMY    . BACK SURGERY  x 7  . bone spur removed  right sholder    . bursitis Bilateral    olecranon I&D  . CATARACT EXTRACTION W/ INTRAOCULAR LENS  IMPLANT, BILATERAL    . EYE SURGERY     bilateral cataracts  . LUMBAR SPINE SURGERY     x7  . SHOULDER SURGERY Bilateral     Family History  Problem Relation Age of Onset  . Cancer Mother   . Heart attack Father   . Dementia Sister     Social History:  reports that he quit smoking about 57 years ago. His smoking use included cigarettes. He has a 1.25 pack-year smoking history. He has never used smokeless tobacco. He reports that he does not drink alcohol or use drugs.    Review of Systems     RENAL function has been fluctuating   Lab Results  Component Value Date   CREATININE 1.60 (H) 12/12/2018   CREATININE 1.59 (H) 12/12/2018   CREATININE 1.88 (H) 12/13/2017         Lipids: not on any statin drug for hyperlipidemia, LDL is well controlled       Lab Results  Component Value Date   CHOL 167 04/29/2017   HDL 57.90 04/29/2017   LDLCALC 92 04/29/2017   TRIG 87.0 04/29/2017   CHOLHDL 3 04/29/2017               Has chronic weakness in his legs from central cord problems previously and not able to ambulate  Previous UTI: He has some pyuria on the exam at the ER, culture pending   Physical Examination:  BP 134/78 (BP Location: Left Arm, Patient Position: Sitting, Cuff Size: Normal)   Pulse 76   Ht 5\' 10"  (1.778 m)   SpO2 96%   BMI 24.25 kg/m   No lower leg or ankle edema  ASSESSMENT:  Diabetes type 2, on insulin  See history of present illness for detailed discussion of his current management, blood sugar patterns and problems identified  His A1c is only 5.4  He has  not been seen in follow-up for a year now  Although he has renal dysfunction his A1c is lower than usual He is getting hypoglycemia with recently low fasting blood sugars and once low before lunch Do not think the hypoglycemia is related to change in his food intake or renal function which are both stable He does appear to be needing less basal insulin  PLAN:  Will reduce insulin doses all-around especially basal insulin Written instructions given and his son will make sure that these are followed since on the last visit instructions were not followed Needs more regular follow-up Need to make sure what meter he is using and consider brand-name meter  Also he will follow-up with PCP regarding abnormal urinalysis   TOUJEO insulin: Stop the morning dose completely Give only 26 units of Toujeo at 8 PM daily   Mealtime HUMALOG coverage based on glucose readings:  If the blood sugar is below 90 give the Humalog insulin after the meal If the blood sugar is below 70 may obtain Humalog insulin  BREAKFAST doses are as follows: Blood sugar under 200 = 6 units, 200-249 = 7 units.  250+ = 8 units  Humalog to be given just before LUNCH and dinner : If blood sugar under 200 = give 8 units 200-249 = 10 units and over 250 = 12 units  Patient Instructions  INSULIN regimen to be changed as follows:  TOUJEO insulin: Stop the morning dose completely Give only 26 units of Toujeo at 8 PM daily   Mealtime HUMALOG coverage based on glucose readings:  If the blood sugar is below 90 give the Humalog insulin after the meal If the blood sugar is below 70 may obtain Humalog insulin  BREAKFAST doses are as follows: Blood sugar under 200 = 6 units, 200-249 = 7 units.  250+ = 8 units  Humalog to be given just before LUNCH and dinner : If blood sugar under 200 = give 8 units 200-249 = 10 units and over 250 = 12 units        Senaida Chilcote 12/13/2018, 3:15 PM   Note: This office note was prepared with  Dragon voice recognition system technology. Any transcriptional errors that result from this process are unintentional. Sleep

## 2018-12-13 ENCOUNTER — Encounter: Payer: Self-pay | Admitting: Endocrinology

## 2018-12-13 ENCOUNTER — Ambulatory Visit (INDEPENDENT_AMBULATORY_CARE_PROVIDER_SITE_OTHER): Payer: Medicare Other | Admitting: Endocrinology

## 2018-12-13 VITALS — BP 134/78 | HR 76 | Ht 70.0 in

## 2018-12-13 DIAGNOSIS — Z794 Long term (current) use of insulin: Secondary | ICD-10-CM

## 2018-12-13 DIAGNOSIS — E1165 Type 2 diabetes mellitus with hyperglycemia: Secondary | ICD-10-CM | POA: Diagnosis not present

## 2018-12-13 LAB — POCT GLYCOSYLATED HEMOGLOBIN (HGB A1C): Hemoglobin A1C: 5.4 % (ref 4.0–5.6)

## 2018-12-13 NOTE — Patient Instructions (Signed)
INSULIN regimen to be changed as follows:    TOUJEO insulin: Stop the morning dose completely Give only 26 units of Toujeo at 8 PM daily   Mealtime HUMALOG coverage based on glucose readings:  If the blood sugar is below 90 give the Humalog insulin after the meal If the blood sugar is below 70 may obtain Humalog insulin  BREAKFAST doses are as follows: Blood sugar under 200 = 6 units, 200-249 = 7 units.  250+ = 8 units  Humalog to be given just before LUNCH and dinner : If blood sugar under 200 = give 8 units 200-249 = 10 units and over 250 = 12 units

## 2018-12-14 LAB — URINE CULTURE: Culture: >=100000 — AB

## 2018-12-15 ENCOUNTER — Telehealth: Payer: Self-pay

## 2018-12-15 NOTE — Telephone Encounter (Signed)
No treatment for [facility] ED 12/12/2018 per [facility]

## 2018-12-17 LAB — CULTURE, BLOOD (ROUTINE X 2)
CULTURE: NO GROWTH
CULTURE: NO GROWTH

## 2018-12-29 ENCOUNTER — Inpatient Hospital Stay (HOSPITAL_COMMUNITY)
Admission: EM | Admit: 2018-12-29 | Discharge: 2019-01-03 | DRG: 689 | Disposition: A | Discharge status: Nursing Home (Includes ICF) | Payer: Medicare Other | Source: Skilled Nursing Facility | Attending: Family Medicine | Admitting: Family Medicine

## 2018-12-29 ENCOUNTER — Other Ambulatory Visit: Payer: Self-pay

## 2018-12-29 ENCOUNTER — Encounter (HOSPITAL_COMMUNITY): Payer: Self-pay | Admitting: *Deleted

## 2018-12-29 ENCOUNTER — Emergency Department (HOSPITAL_COMMUNITY): Payer: Medicare Other

## 2018-12-29 DIAGNOSIS — Z66 Do not resuscitate: Secondary | ICD-10-CM | POA: Diagnosis present

## 2018-12-29 DIAGNOSIS — F0390 Unspecified dementia without behavioral disturbance: Secondary | ICD-10-CM | POA: Diagnosis present

## 2018-12-29 DIAGNOSIS — G8929 Other chronic pain: Secondary | ICD-10-CM | POA: Diagnosis present

## 2018-12-29 DIAGNOSIS — E1129 Type 2 diabetes mellitus with other diabetic kidney complication: Secondary | ICD-10-CM | POA: Diagnosis present

## 2018-12-29 DIAGNOSIS — Z79899 Other long term (current) drug therapy: Secondary | ICD-10-CM

## 2018-12-29 DIAGNOSIS — D509 Iron deficiency anemia, unspecified: Secondary | ICD-10-CM | POA: Diagnosis present

## 2018-12-29 DIAGNOSIS — E11649 Type 2 diabetes mellitus with hypoglycemia without coma: Secondary | ICD-10-CM | POA: Diagnosis not present

## 2018-12-29 DIAGNOSIS — E1122 Type 2 diabetes mellitus with diabetic chronic kidney disease: Secondary | ICD-10-CM | POA: Diagnosis present

## 2018-12-29 DIAGNOSIS — E119 Type 2 diabetes mellitus without complications: Secondary | ICD-10-CM | POA: Diagnosis not present

## 2018-12-29 DIAGNOSIS — I130 Hypertensive heart and chronic kidney disease with heart failure and stage 1 through stage 4 chronic kidney disease, or unspecified chronic kidney disease: Secondary | ICD-10-CM | POA: Diagnosis present

## 2018-12-29 DIAGNOSIS — L8990 Pressure ulcer of unspecified site, unspecified stage: Secondary | ICD-10-CM

## 2018-12-29 DIAGNOSIS — G8250 Quadriplegia, unspecified: Secondary | ICD-10-CM | POA: Diagnosis present

## 2018-12-29 DIAGNOSIS — Z87442 Personal history of urinary calculi: Secondary | ICD-10-CM

## 2018-12-29 DIAGNOSIS — Z981 Arthrodesis status: Secondary | ICD-10-CM | POA: Diagnosis not present

## 2018-12-29 DIAGNOSIS — A499 Bacterial infection, unspecified: Secondary | ICD-10-CM

## 2018-12-29 DIAGNOSIS — H9190 Unspecified hearing loss, unspecified ear: Secondary | ICD-10-CM | POA: Diagnosis present

## 2018-12-29 DIAGNOSIS — Z9842 Cataract extraction status, left eye: Secondary | ICD-10-CM | POA: Diagnosis not present

## 2018-12-29 DIAGNOSIS — N183 Chronic kidney disease, stage 3 (moderate): Secondary | ICD-10-CM | POA: Diagnosis not present

## 2018-12-29 DIAGNOSIS — B961 Klebsiella pneumoniae [K. pneumoniae] as the cause of diseases classified elsewhere: Secondary | ICD-10-CM | POA: Diagnosis present

## 2018-12-29 DIAGNOSIS — F329 Major depressive disorder, single episode, unspecified: Secondary | ICD-10-CM | POA: Diagnosis present

## 2018-12-29 DIAGNOSIS — R7881 Bacteremia: Secondary | ICD-10-CM | POA: Diagnosis present

## 2018-12-29 DIAGNOSIS — G9341 Metabolic encephalopathy: Secondary | ICD-10-CM | POA: Diagnosis present

## 2018-12-29 DIAGNOSIS — N390 Urinary tract infection, site not specified: Secondary | ICD-10-CM | POA: Diagnosis present

## 2018-12-29 DIAGNOSIS — B9689 Other specified bacterial agents as the cause of diseases classified elsewhere: Secondary | ICD-10-CM | POA: Diagnosis not present

## 2018-12-29 DIAGNOSIS — E785 Hyperlipidemia, unspecified: Secondary | ICD-10-CM | POA: Diagnosis present

## 2018-12-29 DIAGNOSIS — N189 Chronic kidney disease, unspecified: Secondary | ICD-10-CM | POA: Diagnosis present

## 2018-12-29 DIAGNOSIS — Z961 Presence of intraocular lens: Secondary | ICD-10-CM | POA: Diagnosis present

## 2018-12-29 DIAGNOSIS — E1140 Type 2 diabetes mellitus with diabetic neuropathy, unspecified: Secondary | ICD-10-CM | POA: Diagnosis present

## 2018-12-29 DIAGNOSIS — I10 Essential (primary) hypertension: Secondary | ICD-10-CM

## 2018-12-29 DIAGNOSIS — Z9841 Cataract extraction status, right eye: Secondary | ICD-10-CM | POA: Diagnosis not present

## 2018-12-29 DIAGNOSIS — E1142 Type 2 diabetes mellitus with diabetic polyneuropathy: Secondary | ICD-10-CM | POA: Diagnosis present

## 2018-12-29 DIAGNOSIS — E1151 Type 2 diabetes mellitus with diabetic peripheral angiopathy without gangrene: Secondary | ICD-10-CM | POA: Diagnosis present

## 2018-12-29 DIAGNOSIS — Z8249 Family history of ischemic heart disease and other diseases of the circulatory system: Secondary | ICD-10-CM

## 2018-12-29 DIAGNOSIS — Z887 Allergy status to serum and vaccine status: Secondary | ICD-10-CM

## 2018-12-29 DIAGNOSIS — I5032 Chronic diastolic (congestive) heart failure: Secondary | ICD-10-CM | POA: Diagnosis present

## 2018-12-29 DIAGNOSIS — Z1612 Extended spectrum beta lactamase (ESBL) resistance: Secondary | ICD-10-CM | POA: Diagnosis present

## 2018-12-29 DIAGNOSIS — E1165 Type 2 diabetes mellitus with hyperglycemia: Secondary | ICD-10-CM | POA: Diagnosis not present

## 2018-12-29 DIAGNOSIS — Z794 Long term (current) use of insulin: Secondary | ICD-10-CM | POA: Diagnosis not present

## 2018-12-29 DIAGNOSIS — F0391 Unspecified dementia with behavioral disturbance: Secondary | ICD-10-CM | POA: Diagnosis not present

## 2018-12-29 DIAGNOSIS — Z87891 Personal history of nicotine dependence: Secondary | ICD-10-CM

## 2018-12-29 DIAGNOSIS — E861 Hypovolemia: Secondary | ICD-10-CM | POA: Diagnosis present

## 2018-12-29 DIAGNOSIS — R4182 Altered mental status, unspecified: Secondary | ICD-10-CM

## 2018-12-29 HISTORY — DX: Urinary tract infection, site not specified: N39.0

## 2018-12-29 HISTORY — DX: Chronic kidney disease, stage 3 (moderate): N18.3

## 2018-12-29 HISTORY — DX: Personal history of urinary calculi: Z87.442

## 2018-12-29 HISTORY — DX: Chronic kidney disease, stage 3 unspecified: N18.30

## 2018-12-29 LAB — URINALYSIS, ROUTINE W REFLEX MICROSCOPIC
Bilirubin Urine: NEGATIVE
Glucose, UA: NEGATIVE mg/dL
Ketones, ur: NEGATIVE mg/dL
Nitrite: POSITIVE — AB
Protein, ur: NEGATIVE mg/dL
Specific Gravity, Urine: 1.013 (ref 1.005–1.030)
WBC, UA: >50 WBC/hpf — ABNORMAL HIGH (ref 0–5)
pH: 5 (ref 5.0–8.0)

## 2018-12-29 LAB — CBC WITH DIFFERENTIAL/PLATELET
Abs Immature Granulocytes: 0.05 10*3/uL (ref 0.00–0.07)
Basophils Absolute: 0.1 10*3/uL (ref 0.0–0.1)
Basophils Relative: 1 %
Eosinophils Absolute: 0.2 10*3/uL (ref 0.0–0.5)
Eosinophils Relative: 2 %
HCT: 39.9 % (ref 39.0–52.0)
Hemoglobin: 12.8 g/dL — ABNORMAL LOW (ref 13.0–17.0)
Immature Granulocytes: 0 %
Lymphocytes Relative: 13 %
Lymphs Abs: 1.6 10*3/uL (ref 0.7–4.0)
MCH: 31.7 pg (ref 26.0–34.0)
MCHC: 32.1 g/dL (ref 30.0–36.0)
MCV: 98.8 fL (ref 80.0–100.0)
Monocytes Absolute: 1 10*3/uL (ref 0.1–1.0)
Monocytes Relative: 8 %
Neutro Abs: 9.4 10*3/uL — ABNORMAL HIGH (ref 1.7–7.7)
Neutrophils Relative %: 76 %
Platelets: 353 10*3/uL (ref 150–400)
RBC: 4.04 MIL/uL — ABNORMAL LOW (ref 4.22–5.81)
RDW: 12.4 % (ref 11.5–15.5)
WBC: 12.3 10*3/uL — ABNORMAL HIGH (ref 4.0–10.5)
nRBC: 0 % (ref 0.0–0.2)

## 2018-12-29 LAB — MRSA PCR SCREENING: MRSA by PCR: NEGATIVE

## 2018-12-29 LAB — COMPREHENSIVE METABOLIC PANEL WITH GFR
ALT: 13 U/L (ref 0–44)
AST: 19 U/L (ref 15–41)
Albumin: 3.3 g/dL — ABNORMAL LOW (ref 3.5–5.0)
Alkaline Phosphatase: 57 U/L (ref 38–126)
Anion gap: 7 (ref 5–15)
BUN: 20 mg/dL (ref 8–23)
CO2: 26 mmol/L (ref 22–32)
Calcium: 9.1 mg/dL (ref 8.9–10.3)
Chloride: 107 mmol/L (ref 98–111)
Creatinine, Ser: 1.6 mg/dL — ABNORMAL HIGH (ref 0.61–1.24)
GFR calc Af Amer: 45 mL/min — ABNORMAL LOW (ref >60)
GFR calc non Af Amer: 38 mL/min — ABNORMAL LOW (ref >60)
Glucose, Bld: 91 mg/dL (ref 70–99)
Potassium: 3.6 mmol/L (ref 3.5–5.1)
Sodium: 140 mmol/L (ref 135–145)
Total Bilirubin: 0.4 mg/dL (ref 0.3–1.2)
Total Protein: 6.2 g/dL — ABNORMAL LOW (ref 6.5–8.1)

## 2018-12-29 LAB — I-STAT TROPONIN, ED: Troponin i, poc: 0.03 ng/mL (ref 0.00–0.08)

## 2018-12-29 LAB — CBG MONITORING, ED: Glucose-Capillary: 88 mg/dL (ref 70–99)

## 2018-12-29 LAB — GLUCOSE, CAPILLARY
Glucose-Capillary: 81 mg/dL (ref 70–99)
Glucose-Capillary: 177 mg/dL — ABNORMAL HIGH (ref 70–99)

## 2018-12-29 LAB — LACTIC ACID, PLASMA
Lactic Acid, Venous: 1.9 mmol/L (ref 0.5–1.9)
Lactic Acid, Venous: 1.4 mmol/L (ref 0.5–1.9)

## 2018-12-29 MED ORDER — FUROSEMIDE 20 MG PO TABS: 10.0000 mg | ORAL_TABLET | Freq: Every day | ORAL | Status: DC

## 2018-12-29 MED ORDER — ENOXAPARIN SODIUM 30 MG/0.3ML ~~LOC~~ SOLN
30.0000 mg | SUBCUTANEOUS | Status: DC
  Administered 2018-12-29 – 2018-12-31 (×3): 30 mg via SUBCUTANEOUS
  Filled 2018-12-29 (×3): qty 0.3

## 2018-12-29 MED ORDER — SODIUM CHLORIDE 0.9 % IV SOLN
INTRAVENOUS | Status: DC
  Administered 2018-12-29 – 2018-12-31 (×3): via INTRAVENOUS

## 2018-12-29 MED ORDER — AMLODIPINE BESYLATE 5 MG PO TABS
5.0000 mg | ORAL_TABLET | Freq: Every day | ORAL | Status: DC
  Administered 2018-12-30 – 2019-01-03 (×5): 5 mg via ORAL
  Filled 2018-12-29 (×5): qty 1

## 2018-12-29 MED ORDER — SODIUM CHLORIDE 0.9 % IV BOLUS
1000.0000 mL | Freq: Once | INTRAVENOUS | Status: AC
  Administered 2018-12-29: 1000 mL via INTRAVENOUS

## 2018-12-29 MED ORDER — SODIUM CHLORIDE 0.9 % IV SOLN
1.0000 g | Freq: Two times a day (BID) | INTRAVENOUS | Status: DC
  Administered 2018-12-29 – 2019-01-03 (×10): 1 g via INTRAVENOUS
  Filled 2018-12-29 (×10): qty 1

## 2018-12-29 MED ORDER — BISACODYL 10 MG RE SUPP: 10.0000 mg | Freq: Every day | RECTAL | Status: DC | PRN

## 2018-12-29 MED ORDER — INSULIN ASPART 100 UNIT/ML ~~LOC~~ SOLN
0.0000 [IU] | Freq: Every day | SUBCUTANEOUS | Status: DC
  Administered 2019-01-01 – 2019-01-02 (×2): 2 [IU] via SUBCUTANEOUS

## 2018-12-29 MED ORDER — INSULIN GLARGINE 100 UNIT/ML ~~LOC~~ SOLN
26.0000 [IU] | Freq: Every day | SUBCUTANEOUS | Status: DC
  Administered 2018-12-29: 26 [IU] via SUBCUTANEOUS
  Filled 2018-12-29 (×2): qty 0.26

## 2018-12-29 MED ORDER — ONDANSETRON HCL 4 MG PO TABS: 4.0000 mg | ORAL_TABLET | Freq: Four times a day (QID) | ORAL | Status: DC | PRN

## 2018-12-29 MED ORDER — ONDANSETRON HCL 4 MG/2ML IJ SOLN: 4.0000 mg | Freq: Four times a day (QID) | INTRAMUSCULAR | Status: DC | PRN

## 2018-12-29 MED ORDER — FERROUS SULFATE 325 (65 FE) MG PO TABS
325.0000 mg | ORAL_TABLET | Freq: Every day | ORAL | Status: DC
  Administered 2018-12-30 – 2019-01-03 (×5): 325 mg via ORAL
  Filled 2018-12-29 (×5): qty 1

## 2018-12-29 MED ORDER — ACETAMINOPHEN 500 MG PO TABS: 500.0000 mg | ORAL_TABLET | Freq: Four times a day (QID) | ORAL | Status: DC | PRN

## 2018-12-29 MED ORDER — SERTRALINE HCL 25 MG PO TABS
25.0000 mg | ORAL_TABLET | Freq: Every day | ORAL | Status: DC
  Administered 2018-12-30 – 2019-01-03 (×5): 25 mg via ORAL
  Filled 2018-12-29 (×5): qty 1

## 2018-12-29 MED ORDER — INSULIN ASPART 100 UNIT/ML ~~LOC~~ SOLN
0.0000 [IU] | Freq: Three times a day (TID) | SUBCUTANEOUS | Status: DC
  Administered 2018-12-30 (×2): 1 [IU] via SUBCUTANEOUS
  Administered 2019-01-01: 3 [IU] via SUBCUTANEOUS
  Administered 2019-01-01: 5 [IU] via SUBCUTANEOUS
  Administered 2019-01-02: 7 [IU] via SUBCUTANEOUS
  Administered 2019-01-02: 2 [IU] via SUBCUTANEOUS
  Administered 2019-01-02: 5 [IU] via SUBCUTANEOUS
  Administered 2019-01-03: 3 [IU] via SUBCUTANEOUS
  Administered 2019-01-03: 2 [IU] via SUBCUTANEOUS
  Administered 2019-01-03: 5 [IU] via SUBCUTANEOUS

## 2018-12-29 MED ORDER — VITAMIN D 25 MCG (1000 UNIT) PO TABS
1000.0000 [IU] | ORAL_TABLET | Freq: Every evening | ORAL | Status: DC
  Administered 2018-12-29 – 2019-01-03 (×6): 1000 [IU] via ORAL
  Filled 2018-12-29 (×6): qty 1

## 2018-12-29 MED ORDER — SODIUM CHLORIDE 0.9 % IV SOLN
1.0000 g | Freq: Once | INTRAVENOUS | Status: AC
  Administered 2018-12-29: 1 g via INTRAVENOUS
  Filled 2018-12-29: qty 1

## 2018-12-29 MED ORDER — INSULIN LISPRO 100 UNIT/ML ~~LOC~~ SOLN: 6.0000 [IU] | Freq: Three times a day (TID) | SUBCUTANEOUS | Status: DC

## 2018-12-29 MED ORDER — DOCUSATE SODIUM 100 MG PO CAPS: 100.0000 mg | ORAL_CAPSULE | Freq: Two times a day (BID) | ORAL | Status: DC | PRN

## 2018-12-29 NOTE — ED Notes (Signed)
Family at bedside. 

## 2018-12-29 NOTE — H&P (Addendum)
History and Physical    Javier Murillo ZOX:[id number] DOB: [birth year] DOA: 12/29/2018  PCP: Juluis Rainier, MD Consultants:  none Patient coming from: Memory care center  Chief Complaint: AMS  HPI: Javier Murillo is a 85 y.o. male with medical history significant for dementia, quadriplegia, hypertension, hyperlipidemia, diabetes mellitus,depression, PVD, peripheral neuropathy, chronic back pain, iron deficiency anemia, dCHF, CKD-3, who presented to the ED today via EMS after he was witnessed to have a shaking episode during breakfast with AMS.  Per report, patient was eating breakfast and all of a sudden had a shaking episode lasted a few seconds and slumped forward and became unresponsive.  Reportedly, his blood sugar was 81.  He quickly recovered his mental status.  He was just seen in our ED January 6 for similar episode and was felt to be due to hypoglycemia.  He had urine cultures and blood cultures drawn that day but was discharged with instructions to see a diabetic specialist the following day to have his insulin regimen modified.  At that time his head CT was unremarkable.  Since that time his urine culture is growing ESBL Klebsiella but patient / family / memory care unit has not been notified of this.  Per his daughter, patient has been more lethargic than usual since that time but has not really voiced any complaints.  He has been a little quieter than usual.  Not eating and drinking as much as usual.  He has had no documented fevers.  She is understandably very upset that these blood cultures were not acted upon.   Currently, patient is not very conversant but is able to tell me that he has burning when he urinates.  ED Course: Patient had stable vital signs.  He was at his baseline mental status.  He was given meropenem IV.  Review of Systems: As per HPI; otherwise review of systems reviewed and negative.   Ambulatory Status: Nonambulatory   Past Medical History:  Diagnosis  Date  . Arthritis   . Chronic low back pain   . Degenerative arthritis   . Depression   . Diabetes mellitus without complication (HCC)   . Diabetic peripheral neuropathy (HCC)   . Gait disorder   . Hyperlipidemia   . Hypertension   . Kidney stones    only once  . Lumbosacral spondylosis   . Peripheral vascular disease (HCC)   . Quadriplegia and quadriparesis (HCC) 08/20/2014    Past Surgical History:  Procedure Laterality Date  . ANTERIOR CERVICAL DECOMP/DISCECTOMY FUSION N/A 08/22/2014   Procedure: ANTERIOR CERVICAL DECOMPRESSION/DISCECTOMY FUSION, cervical three-four;  Surgeon: Hewitt Shorts, MD;  Location: [facility];  Service: Neurosurgery;  Laterality: N/A;  C3-4 anterior cervical decompression with fusion plating and bonegraft  . APPENDECTOMY    . BACK SURGERY     x 7  . bone spur removed  right sholder    . bursitis Bilateral    olecranon I&D  . CATARACT EXTRACTION W/ INTRAOCULAR LENS  IMPLANT, BILATERAL    . EYE SURGERY     bilateral cataracts  . LUMBAR SPINE SURGERY     x7  . SHOULDER SURGERY Bilateral     Social History   Socioeconomic History  . Marital status: Widowed    Spouse name: Not on file  . Number of children: 3  . Years of education: trade  . Highest education level: Not on file  Occupational History  . Occupation: retired    Associate Professor: RETIRED  Comment: [employer]  Social Needs  . Financial resource strain: Not on file  . Food insecurity:    Worry: Not on file    Inability: Not on file  . Transportation needs:    Medical: Not on file    Non-medical: Not on file  Tobacco Use  . Smoking status: Former Smoker    Packs/day: 0.25    Years: 5.00    Pack years: 1.25    Types: Cigarettes    Last attempt to quit: 07/22/1961    Years since quitting: 57.4  . Smokeless tobacco: Never Used  . Tobacco comment: less than 1/4 pack per week  Substance and Sexual Activity  . Alcohol use: No  . Drug use: No  . Sexual activity: Not on  file  Lifestyle  . Physical activity:    Days per week: Not on file    Minutes per session: Not on file  . Stress: Not on file  Relationships  . Social connections:    Talks on phone: Not on file    Gets together: Not on file    Attends religious service: Not on file    Active member of club or organization: Not on file    Attends meetings of clubs or organizations: Not on file    Relationship status: Not on file  . Intimate partner violence:    Fear of current or ex partner: Not on file    Emotionally abused: Not on file    Physically abused: Not on file    Forced sexual activity: Not on file  Other Topics Concern  . Not on file  Social History Narrative   ** Merged History Encounter **        Allergies  Allergen Reactions  . Fluvirin [Influenza Vac Split Quad] Other (See Comments)    Gave patient flu    Family History  Problem Relation Age of Onset  . Cancer Mother   . Heart attack Father   . Dementia Sister     Prior to Admission medications   Medication Sig Start Date End Date Taking? Authorizing Provider  acetaminophen (TYLENOL) 500 MG tablet Take 1 tablet (500 mg total) by mouth every 6 (six) hours as needed (pain). 10/27/16  Yes Tyrone NineGrunz, Ryan B, MD  amLODipine (NORVASC) 5 MG tablet Take 1 tablet (5 mg total) by mouth daily. 09/17/15  Yes Meredeth IdeLama, Gagan S, MD  cholecalciferol (VITAMIN D) 1000 UNITS tablet Take 1,000 Units by mouth every evening.    Yes [provider]  ferrous sulfate 325 (65 FE) MG tablet Take 325 mg by mouth daily with breakfast.   Yes [provider]  furosemide (LASIX) 20 MG tablet Take 1 tablet (20 mg total) by mouth daily. Patient taking differently: Take 10 mg by mouth daily.  09/17/15  Yes Lama, Sarina IllGagan S, MD  HUMALOG 100 UNIT/ML injection Inject 6-12 Units into the skin 3 (three) times daily. In the morning 70-199=6 units, 200-249=7 units, 250+=8 units 2 times daily 70-199=8 units, 200-249=10 units, 250+=12 units 03/26/15  Yes  [provider]  sertraline (ZOLOFT) 25 MG tablet Take 25 mg by mouth daily.   Yes [provider]  TOUJEO SOLOSTAR 300 UNIT/ML SOPN Inject 26 Units into the skin at bedtime.    Yes [provider]    Physical Exam: Vitals:   12/29/18 1130 12/29/18 1200 12/29/18 1230 12/29/18 1300  BP: 121/66 126/62 125/86 (!) 124/58  Pulse: (!) 56 60 64 60  Resp: 12 13 (!)  22 13  Temp:      TempSrc:      SpO2: 100% 98% 99% 100%  Weight:      Height:         . General: Elderly, chronically ill-appearing male in no acute distress.  He is lethargic but will awaken to answer some questions. . Eyes:  PERRL, EOMI, normal lids, iris . ENT:  grossly normal hearing, lips & tongue, mmm.  Poor dentition.   . Neck:  supple, no lymphadenopathy . Cardiovascular:  nL S1, S2, normal rate, reg rhythm, no murmur. Marland Kitchen. Respiratory:   CTA bilaterally with no wheezes/rales/rhonchi.  Normal respiratory effort. . Abdomen:  soft, NT, ND, NABS . Back:   grossly normal alignment . Skin:  no rash or lesions seen on limited exam . Musculoskeletal:  grossly normal tone BUE/BLE, good ROM, no bony abnormality or obvious joint deformity . Lower extremities:  No LE edema.  Limited foot exam with no ulcerations.  2+ distal pulses. Marland Kitchen. Psychiatric:  grossly normal mood and affect, speech fluent and appropriate, AOx3 . Neurologic:  CN 2-12 grossly intact.  Mental status as above.    Radiological Exams on Admission: Dg Chest 1 View  Result Date: 12/29/2018 CLINICAL DATA:  Code sepsis. EXAM: CHEST  1 VIEW COMPARISON:  None FINDINGS: Normal heart size. No pleural effusion or interstitial edema. Decreased lung volumes with platelike atelectasis in both lung bases. No airspace consolidation noted. IMPRESSION: 1. Low lung volumes with bibasilar atelectasis. Electronically Signed   By: Signa Kellaylor  Stroud M.D.   On: 12/29/2018 10:25    EKG: Independently reviewed.  Rate 60 Sinus rhythm Short PR interval Borderline  intraventricular conduction delay Low voltage, precordial leads Borderline repolarization abnormality   Labs on Admission: I have personally reviewed the available labs and imaging studies at the time of the admission.  Pertinent labs:  Sodium 140 potassium 3.6 chloride 107 CO2 26 glucose 91 BUN 20 creatinine 1.6, baseline; calcium 9.1; albumin 3.3.  Otherwise LFTs within normal limits Troponin I 0.03 Lactic acid 1.9 ->1.4 WBC 12.3 hemoglobin 12.8 platelets 353 Urinalysis: Large leukocytes, positive nitrites, greater than 50 WBC, WBC clumps present Reviewed urinalysis from 12/12/2018: Large leukocytes, negative nitrite, 21-50 WBCs   Assessment/Plan Principal Problem:   UTI due to Klebsiella species Active Problems:   HTN (hypertension)   Dementia (HCC)   Type II diabetes mellitus with renal manifestations (HCC)   Chronic diastolic CHF (congestive heart failure) (HCC)   Altered mental state   UTI due to ESBL Klebsiella: This is most likely the cause of patient's episode and altered mental status this morning.  It is probably also what was going on when he was here earlier this month.  Unfortunately, he has been untreated as of yet with antibiotics.  His shaking episode this morning was most likely rigors due to his UTI.  His blood sugar this morning was within normal limits. -Admit to inpatient, MedSurg -Continue meropenem -Follow-up urine and blood cultures, tailor antibiotics as appropriate  Hypertension, stable -Continue home Norvasc; hold Lasix for now as patient appears somewhat volume depleted and has not been eating and drinking as much as usual  Diabetes mellitus type 2, insulin-dependent -He has recently established care with a diabetic specialist who has adjusted his insulin regimen -Continue home insulin regimen -Diabetic heart healthy diet -SSI  Chronic diastolic CHF: Stable, appears euvolemic to slightly hypovolemic -We will hold Lasix for now and give gentle IV  fluids today and tonight.  Depending on his  volume status and labs in the morning his Lasix may be resumed.  His home dose is 10 mg daily.  Dementia: -Continue home sertraline 25 mg daily   DVT prophylaxis: lovenox Code Status: DNR - confirmed with patient/family.  This was the first time this has been brought up with family.  Daughter states that he never specifically stated he wanted to be DNR but she knows that he would never want to live like this. Family Communication: daughter at bedside: Neomia Dear [phone] [facility])  Disposition Plan: Memory Care Facility once clinically improved Consults called: none  Admission status: Admit - It is my clinical opinion that admission to INPATIENT is reasonable and necessary because of the expectation that this patient will require hospital care that crosses at least 2 midnights to treat this condition based on the medical complexity of the problems presented.  Given the aforementioned information, the predictability of an adverse outcome is felt to be significant.     Elyse Hsu MD [facility]  If note is complete, please contact covering daytime or nighttime physician. [email or web address] Password [email or web address]  12/29/2018, 1:20 PM

## 2018-12-29 NOTE — Progress Notes (Signed)
Received report from ED RN. 1245 pm medications and IVF pending to be administered.

## 2018-12-29 NOTE — ED Notes (Signed)
Report attempted 

## 2018-12-29 NOTE — ED Triage Notes (Signed)
Pt was brought in by EMS from [facility] memory care.  EMS states that staff said they "took him to breakfast and then he had an episode of "shaking" and was lowered to the gound and he had some fluid coming from his mouth and was altered".  EMS states that pt has become more alert during transport and is alert and oriented to self, he can give me his name.  He denies any pain.

## 2018-12-29 NOTE — ED Provider Notes (Signed)
[facility] Emergency Department Provider Note MRN:  [id number]  Arrival date & time: 12/29/18     Chief Complaint   Altered Mental Status   History of Present Illness   Javier Murillo is a 85 y.o. year-old male with a history of diabetes, dementia presenting to the ED with chief complaint of altered mental status.  Episode of total body shaking followed by unresponsiveness and drooling at his care facility.  Quickly returned to baseline and was conversant with EMS.  Had a similar episode 2 weeks ago thought to be related to low blood sugar.  Blood glucose with EMS was 80.  I was unable to obtain an accurate HPI, PMH, or ROS due to the patient's dementia, altered mental status.  Review of Systems  Positive for altered mental status, shaking episode.  Patient's Health History    Past Medical History:  Diagnosis Date  . Arthritis   . Chronic low back pain   . CKD (chronic kidney disease), stage III (HCC)   . Degenerative arthritis   . Depression   . Diabetes mellitus without complication (HCC)   . Diabetic peripheral neuropathy (HCC)   . Gait disorder   . History of kidney stones   . Hyperlipidemia   . Hypertension   . Kidney stones    only once  . Lumbosacral spondylosis   . Peripheral vascular disease (HCC)   . Quadriplegia and quadriparesis (HCC) 08/20/2014  . UTI (urinary tract infection) 12/2018    Past Surgical History:  Procedure Laterality Date  . ANTERIOR CERVICAL DECOMP/DISCECTOMY FUSION N/A 08/22/2014   Procedure: ANTERIOR CERVICAL DECOMPRESSION/DISCECTOMY FUSION, cervical three-four;  Surgeon: Hewitt Shorts, MD;  Location: [facility];  Service: Neurosurgery;  Laterality: N/A;  C3-4 anterior cervical decompression with fusion plating and bonegraft  . APPENDECTOMY    . BACK SURGERY     x 7  . bone spur removed  right sholder    . bursitis Bilateral    olecranon I&D  . CATARACT EXTRACTION W/ INTRAOCULAR LENS  IMPLANT, BILATERAL    .  EYE SURGERY     bilateral cataracts  . LUMBAR SPINE SURGERY     x7  . SHOULDER SURGERY Bilateral     Family History  Problem Relation Age of Onset  . Cancer Mother   . Heart attack Father   . Dementia Sister     Social History   Socioeconomic History  . Marital status: Widowed    Spouse name: Not on file  . Number of children: 3  . Years of education: trade  . Highest education level: Not on file  Occupational History  . 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 Financial resource strain: Not on file  . Food insecurity:    Worry: Not on file    Inability: Not on file  . Transportation needs:    Medical: Not on file    Non-medical: Not on file  Tobacco Use  . Smoking status: Former Smoker    Packs/day: 0.25    Years: 5.00    Pack years: 1.25    Types: Cigarettes    Last attempt to quit: 07/22/1961    Years since quitting: 57.4  . Smokeless tobacco: Never Used  . Tobacco comment: less than 1/4 pack per week  Substance and Sexual Activity  . Alcohol use: No  . Drug use: No  . Sexual activity: Not on file  Lifestyle  . Physical activity:    Days per week: Not on file    Minutes per session: Not on file  . Stress: Not on file  Relationships  . Social connections:    Talks on phone: Not on file    Gets together: Not on file    Attends religious service: Not on file    Active member of club or organization: Not on file    Attends meetings of clubs or organizations: Not on file    Relationship status: Not on file  . Intimate partner violence:    Fear of current or ex partner: Not on file    Emotionally abused: Not on file    Physically abused: Not on file    Forced sexual activity: Not on file  Other Topics Concern  . Not on file  Social History Narrative   ** Merged History Encounter **         Physical Exam  Vital Signs and Nursing Notes reviewed Vitals:   12/29/18 1330 12/29/18 1400  BP: (!) 116/54 (!)  142/52  Pulse: 61 62  Resp: 15 17  Temp:    SpO2: 98% 100%    CONSTITUTIONAL: Chronically ill-appearing, NAD NEURO: Eyes closed but awake, opens eyes to voice, oriented to name only, moving all extremities. EYES:  eyes equal and reactive ENT/NECK:  no LAD, no JVD CARDIO: Regular rate, well-perfused, normal S1 and S2 PULM:  CTAB no wheezing or rhonchi GI/GU:  normal bowel sounds, non-distended, non-tender MSK/SPINE:  No gross deformities, no edema SKIN:  no rash, atraumatic PSYCH:  Appropriate speech and behavior  Diagnostic and Interventional Summary    EKG Interpretation  Date/Time:  Thursday December 29 2018 09:42:05 EST Ventricular Rate:  60 PR Interval:    QRS Duration: 112 QT Interval:  423 QTC Calculation: 423 R Axis:   -17 Text Interpretation:  Sinus rhythm Short PR interval Borderline intraventricular conduction delay Low voltage, precordial leads Borderline repolarization abnormality Confirmed by Kennis CarinaBero, Armari Fussell [phone]([id number]) on 12/29/2018 10:16:23 AM      Labs Reviewed  COMPREHENSIVE METABOLIC PANEL - Abnormal; Notable for the following components:      Result Value   Creatinine, Ser 1.60 (*)    Total Protein 6.2 (*)    Albumin 3.3 (*)    GFR calc non Af Amer 38 (*)    GFR calc Af Amer 45 (*)    All other components within normal limits  CBC WITH DIFFERENTIAL/PLATELET - Abnormal; Notable for the following components:   WBC 12.3 (*)    RBC 4.04 (*)    Hemoglobin 12.8 (*)    Neutro Abs 9.4 (*)    All other components within normal limits  URINALYSIS, ROUTINE W REFLEX MICROSCOPIC - Abnormal; Notable for the following components:   APPearance CLOUDY (*)    Hgb urine dipstick SMALL (*)    Nitrite POSITIVE (*)    Leukocytes, UA LARGE (*)    WBC, UA >50 (*)    Bacteria, UA MANY (*)    All other components within normal limits  CULTURE, BLOOD (ROUTINE X 2)  CULTURE, BLOOD (ROUTINE X 2)  URINE CULTURE  LACTIC ACID, PLASMA  LACTIC ACID, PLASMA  CBG MONITORING, ED    I-STAT TROPONIN, ED    DG Chest 1 View  Final Result      Medications  ferrous sulfate tablet 325 mg (has no administration in time range)  cholecalciferol (VITAMIN D3) tablet 1,000 Units (has no administration in time range)  amLODipine (NORVASC) tablet 5 mg (has no administration in time range)  insulin glargine (LANTUS) injection 26 Units (has no administration in time range)  acetaminophen (TYLENOL) tablet 500 mg (has no administration in time range)  sertraline (ZOLOFT) tablet 25 mg (has no administration in time range)  enoxaparin (LOVENOX) injection 30 mg (has no administration in time range)  docusate sodium (COLACE) capsule 100 mg (has no administration in time range)  bisacodyl (DULCOLAX) suppository 10 mg (has no administration in time range)  ondansetron (ZOFRAN) tablet 4 mg (has no administration in time range)    Or  ondansetron (ZOFRAN) injection 4 mg (has no administration in time range)  insulin aspart (novoLOG) injection 0-5 Units (has no administration in time range)  insulin aspart (novoLOG) injection 0-9 Units (has no administration in time range)  0.9 %  sodium chloride infusion ( Intravenous New Bag/Given 12/29/18 1427)  meropenem (MERREM) 1 g in sodium chloride 0.9 % 100 mL IVPB (has no administration in time range)  sodium chloride 0.9 % bolus 1,000 mL (0 mLs Intravenous Stopped 12/29/18 1058)  meropenem (MERREM) 1 g in sodium chloride 0.9 % 100 mL IVPB (0 g Intravenous Stopped 12/29/18 1056)     Procedures Critical Care Critical Care Documentation Critical care time provided by me (excluding procedures): 36 minutes  Condition necessitating critical care: Altered mental status, concern for early sepsis  Components of critical care management: reviewing of prior records, laboratory and imaging interpretation, frequent re-examination and reassessment of vital signs, administration of IV antibiotics, initiation of code sepsis protocol, discussion with consulting  services    ED Course and Medical Decision Making  I have reviewed the triage vital signs and the nursing notes.  Pertinent labs & imaging results that were available during my care of the patient were reviewed by me and considered in my medical decision making (see below for details).  Repeated episode of altered mental status in the 85 year old male with history of diabetes.  Chart review reveals that a urine culture obtained during his last ED visit, during the initial episode of this altered mental status grew ESBL Klebsiella, favored to be the likely cause of patient's shaking episode today, suspect rigors and continued infection.  Daughter is bedside and confirms that no antibiotics have been given, they were unaware of this culture result.  I discussed this lapse in follow-up with the pharmacy staff, who is looking into the issue.  Doubt seizure given lack of history, relatively quick return to baseline.  No meningismus on exam, doubt meningitis or encephalitis.  CT head 2 weeks ago was unremarkable.  Code sepsis initiated, provide fluids, meropenem, will admit.  Admitted to hospital service for further care.  Elmer SowMichael M. Pilar PlateBero, MD [facility] [email or web address]  Final Clinical Impressions(s) / ED Diagnoses     ICD-10-CM   1. ESBL (extended spectrum beta-lactamase) producing bacteria infection A49.9    Z16.12     ED Discharge Orders    None         Sabas SousBero, Kai Railsback M, MD 12/29/18 1547

## 2018-12-29 NOTE — Progress Notes (Signed)
Pharmacy Antibiotic Note  Javier Murillo is a 85 y.o. male admitted on 12/29/2018 with UTI/ESBL  Pharmacy has been consulted for Merrem dosing.  Plan: Meropenem 1 gram IV q12 hour Monitor clinical progress, cultures/sensitivities, renal function, abx plan   Height: 5\' 10"  (177.8 cm) Weight: 162 lb 1 oz (73.5 kg) IBW/kg (Calculated) : 73  Temp (24hrs), Avg:97.6 F (36.4 C), Min:97.4 F (36.3 C), Max:97.8 F (36.6 C)  Recent Labs  Lab 12/29/18 1014 12/29/18 1015  WBC 12.3*  --   CREATININE 1.60*  --   LATICACIDVEN 1.9 1.4    Estimated Creatinine Clearance: 34.2 mL/min (A) (by C-G formula based on SCr of 1.6 mg/dL (H)).    Allergies  Allergen Reactions  . Fluvirin [Influenza Vac Split Quad] Other (See Comments)    Gave patient flu    Antimicrobials this admission: 1/23 merrem >>   Dose adjustments this admission:   Microbiology results: 1/23 BCx: sent 1/23 UCx: sent    Thank you for allowing [place] to participate in this patients care.   Signe Colt, PharmD Please utilize Amion (under [facility]) for appropriate number for your unit pharmacist. 12/29/2018 2:38 PM

## 2018-12-29 NOTE — ED Notes (Signed)
Called unit again to give report.

## 2018-12-30 DIAGNOSIS — E1122 Type 2 diabetes mellitus with diabetic chronic kidney disease: Secondary | ICD-10-CM

## 2018-12-30 DIAGNOSIS — B961 Klebsiella pneumoniae [K. pneumoniae] as the cause of diseases classified elsewhere: Secondary | ICD-10-CM

## 2018-12-30 DIAGNOSIS — N390 Urinary tract infection, site not specified: Principal | ICD-10-CM

## 2018-12-30 DIAGNOSIS — I10 Essential (primary) hypertension: Secondary | ICD-10-CM

## 2018-12-30 DIAGNOSIS — Z794 Long term (current) use of insulin: Secondary | ICD-10-CM

## 2018-12-30 DIAGNOSIS — N183 Chronic kidney disease, stage 3 (moderate): Secondary | ICD-10-CM

## 2018-12-30 LAB — CBC
HCT: 39.6 % (ref 39.0–52.0)
Hemoglobin: 12.5 g/dL — ABNORMAL LOW (ref 13.0–17.0)
MCH: 30.9 pg (ref 26.0–34.0)
MCHC: 31.6 g/dL (ref 30.0–36.0)
MCV: 98 fL (ref 80.0–100.0)
Platelets: 315 10*3/uL (ref 150–400)
RBC: 4.04 MIL/uL — ABNORMAL LOW (ref 4.22–5.81)
RDW: 12.4 % (ref 11.5–15.5)
WBC: 10.8 10*3/uL — ABNORMAL HIGH (ref 4.0–10.5)
nRBC: 0 % (ref 0.0–0.2)

## 2018-12-30 LAB — BASIC METABOLIC PANEL
Anion gap: 6 (ref 5–15)
BUN: 16 mg/dL (ref 8–23)
CO2: 27 mmol/L (ref 22–32)
Calcium: 9.1 mg/dL (ref 8.9–10.3)
Chloride: 105 mmol/L (ref 98–111)
Creatinine, Ser: 1.55 mg/dL — ABNORMAL HIGH (ref 0.61–1.24)
GFR calc Af Amer: 46 mL/min — ABNORMAL LOW (ref >60)
GFR calc non Af Amer: 40 mL/min — ABNORMAL LOW (ref >60)
Glucose, Bld: 109 mg/dL — ABNORMAL HIGH (ref 70–99)
Potassium: 3.7 mmol/L (ref 3.5–5.1)
Sodium: 138 mmol/L (ref 135–145)

## 2018-12-30 LAB — GLUCOSE, CAPILLARY
Glucose-Capillary: 91 mg/dL (ref 70–99)
Glucose-Capillary: 128 mg/dL — ABNORMAL HIGH (ref 70–99)
Glucose-Capillary: 153 mg/dL — ABNORMAL HIGH (ref 70–99)
Glucose-Capillary: 189 mg/dL — ABNORMAL HIGH (ref 70–99)
Glucose-Capillary: 146 mg/dL — ABNORMAL HIGH (ref 70–99)

## 2018-12-30 MED ORDER — DIPHENHYDRAMINE HCL 50 MG/ML IJ SOLN
INTRAMUSCULAR | Status: AC
  Filled 2018-12-30: qty 2

## 2018-12-30 MED ORDER — MIDAZOLAM HCL 2 MG/2ML IJ SOLN
INTRAMUSCULAR | Status: AC
  Filled 2018-12-30: qty 2

## 2018-12-30 MED ORDER — INSULIN GLARGINE 100 UNIT/ML ~~LOC~~ SOLN
20.0000 [IU] | Freq: Every day | SUBCUTANEOUS | Status: DC
  Administered 2018-12-31: 20 [IU] via SUBCUTANEOUS
  Filled 2018-12-30: qty 0.2

## 2018-12-30 MED ORDER — ONDANSETRON HCL 4 MG/2ML IJ SOLN
INTRAMUSCULAR | Status: AC
  Filled 2018-12-30: qty 4

## 2018-12-30 MED ORDER — DEXAMETHASONE SODIUM PHOSPHATE 10 MG/ML IJ SOLN
INTRAMUSCULAR | Status: AC
  Filled 2018-12-30: qty 2

## 2018-12-30 MED ORDER — PHENYLEPHRINE 40 MCG/ML (10ML) SYRINGE FOR IV PUSH (FOR BLOOD PRESSURE SUPPORT)
PREFILLED_SYRINGE | INTRAVENOUS | Status: AC
  Filled 2018-12-30: qty 10

## 2018-12-30 NOTE — Progress Notes (Signed)
PROGRESS NOTE  Javier Murillo JQZ:[id number] DOB: 06-14-1930 DOA: 12/29/2018 PCP: Juluis Rainier, MD  Brief History   85 year old male presented to the emergency department after a shaking episode and episode of unresponsiveness.  Blood sugar reportedly within normal limits at the time.  Patient more lethargic than usual.  Seen previously in the emergency department approximately 2 weeks ago and urine culture was positive but not treated.  Admitted for UTI with acute encephalopathy.  A & P  UTI with acute encephalopathy with associated rigors --Continue meropenem based on previous culture results.  Follow-up urine culture from this admission. --Patient not at baseline  Essential hypertension --Stable.  Continue amlodipine  Diabetes mellitus type 2 on insulin --Stable.  Continue home insulin regimen, diabetic diet, sliding scale insulin  Chronic diastolic CHF --Appears euvolemic.  Resume low-dose Lasix daily.  Dementia, depression --Continue sertraline.  Not on any other agents.   Afebrile and vital signs are stable but he remains encephalopathic and will continue treatment with IV antibiotics for ESBL pathogen  DVT prophylaxis: enoxaparin Code Status: DNR Family Communication: daughter at bedside Disposition Plan: return to LTC   Brendia Sacks, MD  [facility] Direct contact: see [email or web address]  7PM-7AM contact night coverage as above 12/30/2018, 2:36 PM  LOS: 1 day   Consultants  .   Procedures  .   Antibiotics  . Meropenem 1/23 >  Interval History/Subjective  Is awake but responds very slowly and not appropriately to questions.  Daughter at bedside reports this is not baseline.  Objective   Vitals:  Vitals:   12/30/18 0733 12/30/18 1030  BP: (!) 136/115 (!) 132/96  Pulse: 73   Resp: 20   Temp: (!) 97.5 F (36.4 C)   SpO2: 94%     Exam:  Constitutional:  . Appears calm and comfortable Eyes:  . pupils and irises appear normal . Normal  lids  ENMT:  . Hard of hearing . Lips appear normal Respiratory:  . CTA bilaterally, no w/r/r.  . Respiratory effort normal.  Cardiovascular:  . RRR, no m/r/g . No LE extremity edema   Abdomen:  . Soft, nontender, nondistended Musculoskeletal:  . Grossly nonfocal exam Neurologic:  . Grossly nonfocal Psychiatric:  . Mental status o Mood withdrawn, affect flat . judgment and insight appear impaired   I have personally reviewed the following:   Today's Data  . CBG stable . Creatinine 1.55, potassium within normal limits . WBC modestly improved 10.8, remainder CBC unremarkable . Urine culture growing gram-negative rods  Lab Data  .   Micro Data  .   Imaging  .   Cardiology Data  .   Other Data  .   Scheduled Meds: . amLODipine  5 mg Oral Daily  . cholecalciferol  1,000 Units Oral QPM  . enoxaparin (LOVENOX) injection  30 mg Subcutaneous Q24H  . ferrous sulfate  325 mg Oral Q breakfast  . insulin aspart  0-5 Units Subcutaneous QHS  . insulin aspart  0-9 Units Subcutaneous TID WC  . insulin glargine  26 Units Subcutaneous QHS  . sertraline  25 mg Oral Daily   Continuous Infusions: . sodium chloride 75 mL/hr at 12/29/18 1600  . meropenem [facility]) IV 1 g (12/30/18 6226)    Principal Problem:   UTI due to Klebsiella species Active Problems:   HTN (hypertension)   Dementia (HCC)   Type II diabetes mellitus with renal manifestations (HCC)   Chronic diastolic CHF (congestive heart failure) (HCC)   Altered mental  state   LOS: 1 day

## 2018-12-31 DIAGNOSIS — G9341 Metabolic encephalopathy: Secondary | ICD-10-CM

## 2018-12-31 DIAGNOSIS — E119 Type 2 diabetes mellitus without complications: Secondary | ICD-10-CM

## 2018-12-31 DIAGNOSIS — F0391 Unspecified dementia with behavioral disturbance: Secondary | ICD-10-CM

## 2018-12-31 LAB — GLUCOSE, CAPILLARY
Glucose-Capillary: 118 mg/dL — ABNORMAL HIGH (ref 70–99)
Glucose-Capillary: 57 mg/dL — ABNORMAL LOW (ref 70–99)
Glucose-Capillary: 118 mg/dL — ABNORMAL HIGH (ref 70–99)
Glucose-Capillary: 68 mg/dL — ABNORMAL LOW (ref 70–99)
Glucose-Capillary: 109 mg/dL — ABNORMAL HIGH (ref 70–99)
Glucose-Capillary: 138 mg/dL — ABNORMAL HIGH (ref 70–99)
Glucose-Capillary: 133 mg/dL — ABNORMAL HIGH (ref 70–99)

## 2018-12-31 LAB — URINE CULTURE: Culture: 80000 — AB

## 2018-12-31 NOTE — Progress Notes (Signed)
  PROGRESS NOTE  Javier Murillo [id number] DOB: 01-May-1930 DOA: 12/29/2018 PCP: Juluis RainierBarnes, Elizabeth, MD  Brief History   85 year old male presented to the emergency department after a shaking episode and episode of unresponsiveness.  Blood sugar reportedly within normal limits at the time.  Patient more lethargic than usual.  Seen previously in the emergency department approximately 2 weeks ago and urine culture was positive but not treated.  Admitted for UTI with acute encephalopathy.  A & P  UTI with acute encephalopathy with associated rigors --Afebrile, vital signs are stable.  Remains encephalopathic.  Not at baseline. --Continue meropenem based on culture data  Essential hypertension --Remains stable.  Continue amlodipine  Diabetes mellitus type 2 on insulin --Given hypoglycemia and inconsistent oral intake, will stop long-acting insulin, continue sliding scale insulin.  Chronic diastolic CHF --Continues to appear euvolemic.  Continue low-dose Lasix daily.  Dementia, depression --Continue sertraline.  Not on any other agents.   Given encephalopathy and ESBL organism, continue current antibiotics.  DVT prophylaxis: enoxaparin Code Status: DNR Family Communication: daughter at bedside 1/25, we reviewed current care Disposition Plan: return to LTC   [name] Oni Dietzman, MD  [facility] Direct contact: see [email or web address]  7PM-7AM contact night coverage as above 12/31/2018, 5:38 PM  LOS: 2 days   Consultants  .   Procedures  .   Antibiotics  . Meropenem 1/23 >  Interval History/Subjective  Hypoglycemic this morning.  Resting.  Objective   Vitals:  Vitals:   12/31/18 0737 12/31/18 1700  BP: (!) 152/94 (!) 150/90  Pulse: (!) 56 60  Resp:    Temp: 97.7 F (36.5 C) 98 F (36.7 C)  SpO2: 98% 98%    Exam:  Constitutional:   . Appears calm and comfortable Eyes:  . pupils and irises appear normal ENMT:  . Lips appear normal Respiratory:  . CTA  bilaterally, no w/r/r.  . Respiratory effort normal.  Cardiovascular:  . RRR, no m/r/g . No LE extremity edema   Abdomen:  . Soft, nontender, nondistended Musculoskeletal:  . RUE, LUE, RLE, LLE   . Does not follow commands but grossly normal tone and strength Psychiatric:  . Mental status . Sleeping but does arouse to touch.    I have personally reviewed the following:   Today's Data  . CBG has been stable since this a.m. hypoglycemic event.  Lab Data  .   Micro Data  .   Imaging  .   Cardiology Data  .   Other Data  .   Scheduled Meds: . amLODipine  5 mg Oral Daily  . cholecalciferol  1,000 Units Oral QPM  . enoxaparin (LOVENOX) injection  30 mg Subcutaneous Q24H  . ferrous sulfate  325 mg Oral Q breakfast  . insulin aspart  0-5 Units Subcutaneous QHS  . insulin aspart  0-9 Units Subcutaneous TID WC  . sertraline  25 mg Oral Daily   Continuous Infusions: . sodium chloride 75 mL/hr at 12/31/18 0720  . meropenem (MERREM) IV Stopped (12/31/18 1524)    Principal Problem:   UTI due to Klebsiella species Active Problems:   HTN (hypertension)   Dementia (HCC)   Type II diabetes mellitus with renal manifestations (HCC)   Chronic diastolic CHF (congestive heart failure) (HCC)   Altered mental state   LOS: 2 days

## 2018-12-31 NOTE — Progress Notes (Addendum)
Hypoglycemic Event  CBG: 57  Treatment: Pt given 4 oz OJ  Symptoms: Lethargic   Follow-up CBG: Time: 0807 CBG Result: 118  Possible Reasons for Event: Hadn't eaten breakfast yet  Comments/MD notified: Yes    Mareo Portilla K Brycin Kille

## 2019-01-01 DIAGNOSIS — N390 Urinary tract infection, site not specified: Secondary | ICD-10-CM

## 2019-01-01 DIAGNOSIS — B9689 Other specified bacterial agents as the cause of diseases classified elsewhere: Secondary | ICD-10-CM

## 2019-01-01 DIAGNOSIS — I5032 Chronic diastolic (congestive) heart failure: Secondary | ICD-10-CM

## 2019-01-01 DIAGNOSIS — I10 Essential (primary) hypertension: Secondary | ICD-10-CM

## 2019-01-01 LAB — BASIC METABOLIC PANEL
Anion gap: 9 (ref 5–15)
BUN: 13 mg/dL (ref 8–23)
CO2: 26 mmol/L (ref 22–32)
Calcium: 9.1 mg/dL (ref 8.9–10.3)
Chloride: 104 mmol/L (ref 98–111)
Creatinine, Ser: 1.52 mg/dL — ABNORMAL HIGH (ref 0.61–1.24)
GFR calc Af Amer: 47 mL/min — ABNORMAL LOW (ref >60)
GFR calc non Af Amer: 41 mL/min — ABNORMAL LOW (ref >60)
Glucose, Bld: 118 mg/dL — ABNORMAL HIGH (ref 70–99)
POTASSIUM: 4 mmol/L (ref 3.5–5.1)
Sodium: 139 mmol/L (ref 135–145)

## 2019-01-01 LAB — GLUCOSE, CAPILLARY
Glucose-Capillary: 90 mg/dL (ref 70–99)
Glucose-Capillary: 239 mg/dL — ABNORMAL HIGH (ref 70–99)
Glucose-Capillary: 206 mg/dL — ABNORMAL HIGH (ref 70–99)
Glucose-Capillary: 246 mg/dL — ABNORMAL HIGH (ref 70–99)

## 2019-01-01 MED ORDER — ENOXAPARIN SODIUM 40 MG/0.4ML ~~LOC~~ SOLN
40.0000 mg | SUBCUTANEOUS | Status: DC
  Administered 2019-01-01 – 2019-01-03 (×3): 40 mg via SUBCUTANEOUS
  Filled 2019-01-01 (×3): qty 0.4

## 2019-01-01 MED ORDER — KCL IN DEXTROSE-NACL 20-5-0.45 MEQ/L-%-% IV SOLN
INTRAVENOUS | Status: DC
  Administered 2019-01-01: 23:00:00 via INTRAVENOUS
  Administered 2019-01-01: 75 mL/h via INTRAVENOUS
  Filled 2019-01-01 (×2): qty 1000

## 2019-01-01 NOTE — Progress Notes (Signed)
CSW spoke with MD, patient is from [facility]. Patient should be able to return on Monday.   Please follow up with facility about taking the patient back.   CSW will continue to follow.   Drucilla Schmidt, MSW, LCSW-A Clinical Social Worker [employer]

## 2019-01-01 NOTE — Progress Notes (Addendum)
PROGRESS NOTE  Javier Murillo [id number] DOB: 08-02-1930 DOA: 12/29/2018 PCP: Juluis RainierBarnes, Elizabeth, MD  Brief History   85 year old male presented to the emergency department after a shaking episode and episode of unresponsiveness.  Blood sugar reportedly within normal limits at the time.  Patient more lethargic than usual.  Seen previously in the emergency department approximately 2 weeks ago and urine culture was positive but not treated.  Admitted for UTI with acute encephalopathy.  A & P  Klebsiella pneumoniae ESBL UTI with acute encephalopathy with associated rigors --Remains afebrile, hemodynamics are stable.  Encephalopathic but somewhat better today.  --Continue meropenem based on culture data  Essential hypertension --Stable.  Continue amlodipine  Diabetes mellitus type 2 on insulin, with CKD stage III --Stable CBG and creatinine.  Continue sliding scale insulin.  Last hypoglycemic event 5 PM yesterday.  Chronic diastolic CHF --Appears euvolemic.  Continue low-dose Lasix daily.  Dementia, depression --Continue sertraline.  Not on any other agents.   Given encephalopathy and ESBL organism, continue current antibiotics.  DVT prophylaxis: enoxaparin Code Status: DNR Family Communication: daughter at bedside 1/25, again reviewed current care Disposition Plan: return to LTC   Javier Murillo , MD  [facility] Direct contact: see [email or web address]  7PM-7AM contact night coverage as above 01/01/2019, 2:14 PM  LOS: 3 days   Consultants  .   Procedures  .   Antibiotics  . Meropenem 1/23 >  Interval History/Subjective  A little more awake this morning.  Daughter at bedside.  May be hallucinating.  Objective   Vitals:  Vitals:   12/31/18 2346 01/01/19 0748  BP: (!) 147/60 (!) 143/60  Pulse: 66 63  Resp: 18 20  Temp: 98.2 F (36.8 C) (!) 97.3 F (36.3 C)  SpO2: 99% 100%    Exam:  Constitutional:   . Appears calm and comfortable, awakens to voice,  mumbles incoherently, does follow some simple commands Eyes:  . pupils and irises appear normal . Normal lids  ENMT:  . grossly normal hearing  . Tongue appears unremarkable Respiratory:  . CTA bilaterally, no w/r/r.  . Respiratory effort normal. Cardiovascular:  . RRR, no m/r/g . No LE extremity edema   Abdomen:  . Soft, nontender, nondistended Musculoskeletal:  . RUE, LUE, RLE, LLE   . Seems to move all extremities Psychiatric:  . Mental status o Mood, affect not assessable . judgment and insight appear impaired  I have personally reviewed the following:   Today's Data  . No further hypoglycemic events. . Creatinine stable at 1.52, appears to be at baseline.  Remainder BMP unremarkable.  Excellent urine output, 2200.  Lab Data  .   Micro Data  . Urine culture Klebsiella pneumoniae, ESBL . Blood cultures no growth 3 days  Imaging  .   Cardiology Data  .   Other Data  .   Scheduled Meds: . amLODipine  5 mg Oral Daily  . cholecalciferol  1,000 Units Oral QPM  . enoxaparin (LOVENOX) injection  40 mg Subcutaneous Q24H  . ferrous sulfate  325 mg Oral Q breakfast  . insulin aspart  0-5 Units Subcutaneous QHS  . insulin aspart  0-9 Units Subcutaneous TID WC  . sertraline  25 mg Oral Daily   Continuous Infusions: . dextrose 5 % and 0.45 % NaCl with KCl 20 mEq/L 75 mL/hr (01/01/19 0835)  . meropenem (MERREM) IV 1 g (01/01/19 0836)    Principal Problem:   Urinary tract infection due to ESBL Klebsiella Active Problems:   Diabetes  mellitus with neuropathy (HCC)   Dementia (HCC)   Type II diabetes mellitus with renal manifestations (HCC)   Chronic diastolic CHF (congestive heart failure) (HCC)   Acute metabolic encephalopathy   Benign essential HTN   LOS: 3 days

## 2019-01-01 NOTE — Plan of Care (Signed)
Pt with condom cath in place, excellent urine output.

## 2019-01-01 NOTE — Progress Notes (Signed)
Pharmacy Antibiotic Note  Javier Murillo is a 85 y.o. male admitted on 12/29/2018 with UTI/ESBL  Pharmacy has been consulted for Merrem dosing.Today is Day #4 of meropenem for ESBL Klebsiella UTI. Would recommend 7 days of therapy.   Plan: Meropenem 1 gram IV q12 hour Monitor clinical progress, renal function, Length of therapy plan.    Height: 5\' 10"  (177.8 cm) Weight: 162 lb 1 oz (73.5 kg) IBW/kg (Calculated) : 73  Temp (24hrs), Avg:97.8 F (36.6 C), Min:97.3 F (36.3 C), Max:98.2 F (36.8 C)  Recent Labs  Lab 12/29/18 1014 12/29/18 1015 12/30/18 0357 01/01/19 0335  WBC 12.3*  --  10.8*  --   CREATININE 1.60*  --  1.55* 1.52*  LATICACIDVEN 1.9 1.4  --   --     Estimated Creatinine Clearance: 36 mL/min (A) (by C-G formula based on SCr of 1.52 mg/dL (H)).    Allergies  Allergen Reactions  . Fluvirin [Influenza Vac Split Quad] Other (See Comments)    Gave patient flu    Antimicrobials this admission: 1/23 merrem >>   Dose adjustments this admission:   Microbiology results: 1/23 BCx: sent 1/23 UCx: 80K Klebsiella ESBL   Clinten Howk A. Jeanella Craze, PharmD, BCPS Clinical Pharmacist [place] Please utilize Amion for appropriate phone number to reach the unit pharmacist [facility])   01/01/2019 11:51 AM

## 2019-01-02 DIAGNOSIS — L8990 Pressure ulcer of unspecified site, unspecified stage: Secondary | ICD-10-CM

## 2019-01-02 LAB — GLUCOSE, CAPILLARY
Glucose-Capillary: 198 mg/dL — ABNORMAL HIGH (ref 70–99)
Glucose-Capillary: 328 mg/dL — ABNORMAL HIGH (ref 70–99)
Glucose-Capillary: 263 mg/dL — ABNORMAL HIGH (ref 70–99)
Glucose-Capillary: 206 mg/dL — ABNORMAL HIGH (ref 70–99)

## 2019-01-02 MED ORDER — FUROSEMIDE 20 MG PO TABS
10.0000 mg | ORAL_TABLET | Freq: Every day | ORAL | Status: DC
  Administered 2019-01-02 – 2019-01-03 (×2): 10 mg via ORAL
  Filled 2019-01-02 (×2): qty 1

## 2019-01-02 MED ORDER — INSULIN GLARGINE 100 UNIT/ML ~~LOC~~ SOLN
10.0000 [IU] | Freq: Every day | SUBCUTANEOUS | Status: DC
  Administered 2019-01-02: 10 [IU] via SUBCUTANEOUS
  Filled 2019-01-02 (×2): qty 0.1

## 2019-01-02 NOTE — Plan of Care (Signed)
  Problem: Clinical Measurements: Goal: Respiratory complications will improve Outcome: Progressing   Problem: Clinical Measurements: Goal: Ability to maintain clinical measurements within normal limits will improve Outcome: Progressing   Problem: Clinical Measurements: Goal: Cardiovascular complication will be avoided Outcome: Progressing   

## 2019-01-02 NOTE — Progress Notes (Signed)
PROGRESS NOTE  Javier Murillo UGQ:[id number] DOB: Sep 13, 1930 DOA: 12/29/2018 PCP: Juluis Rainier, MD  Brief History   85 year old male presented to the emergency department after a shaking episode and episode of unresponsiveness.  Blood sugar reportedly within normal limits at the time.  Patient more lethargic than usual.  Seen previously in the emergency department approximately 2 weeks ago and urine culture was positive but not treated.  Admitted for UTI with acute encephalopathy.  A & P  Klebsiella pneumoniae ESBL UTI with acute encephalopathy with associated rigors --Starting to improve now, encephalopathy appears better today. --Continue current antibiotic.  May be able to change to oral therapy 1/28.  Essential hypertension --Remains stable.  Continue amlodipine  Diabetes mellitus type 2 on insulin, with CKD stage III --Now hyperglycemic, will resume low-dose long-acting insulin.  Chronic diastolic CHF --Appears euvolemic.  Continue low-dose Lasix daily.  Dementia, depression --Continue sertraline.  Not on any other agents.   Given encephalopathy and ESBL organism, continue current antibiotics.  DVT prophylaxis: enoxaparin Code Status: DNR Family Communication:  Disposition Plan: return to LTC, likely next 48 hours   Brendia Sacks, MD  [facility] Direct contact: see [email or web address]  7PM-7AM contact night coverage as above 01/02/2019, 1:38 PM  LOS: 4 days   Consultants  .   Procedures  .   Antibiotics  . Meropenem 1/23 >  Interval History/Subjective  More awake today, attempts to answer questions but history is not currently obtainable.  Objective   Vitals:  Vitals:   01/02/19 0511 01/02/19 0748  BP: (!) 171/73 (!) 152/84  Pulse: 65 63  Resp: 17   Temp: 98.3 F (36.8 C)   SpO2: 96% 96%    Exam:  Constitutional:   . Appears calm and comfortable, awake today and following simple commands Eyes:  . pupils and irises appear  normal . Normal lids  ENMT:  . grossly normal hearing  . Tongue and buccal mucosa appear unremarkable Respiratory:  . CTA bilaterally, no w/r/r.  . Respiratory effort normal. Cardiovascular:  . RRR, no m/r/g . No LE extremity edema   Abdomen:  . Soft, nontender, nondistended Musculoskeletal:  . RUE, LUE, RLE, LLE   . Moves all extremities to command Psychiatric:  . Mental status . Much more awake and interactive today.   I have personally reviewed the following:   Today's Data  . Now hyperglycemic  Lab Data  .   Micro Data  . Urine culture Klebsiella pneumoniae, ESBL . Blood cultures no growth 3 days  Imaging  .   Cardiology Data  .   Other Data  .   Scheduled Meds: . amLODipine  5 mg Oral Daily  . cholecalciferol  1,000 Units Oral QPM  . enoxaparin (LOVENOX) injection  40 mg Subcutaneous Q24H  . ferrous sulfate  325 mg Oral Q breakfast  . furosemide  10 mg Oral Daily  . insulin aspart  0-5 Units Subcutaneous QHS  . insulin aspart  0-9 Units Subcutaneous TID WC  . insulin glargine  10 Units Subcutaneous QHS  . sertraline  25 mg Oral Daily   Continuous Infusions: . meropenem (MERREM) IV 1 g (01/02/19 0840)    Principal Problem:   Urinary tract infection due to ESBL Klebsiella Active Problems:   Diabetes mellitus with neuropathy (HCC)   Dementia (HCC)   Type II diabetes mellitus with renal manifestations (HCC)   Chronic diastolic CHF (congestive heart failure) (HCC)   Acute metabolic encephalopathy   Benign essential HTN  Pressure ulcer   LOS: 4 days

## 2019-01-02 NOTE — NC FL2 (Addendum)
[place] MEDICAID FL2 LEVEL OF CARE SCREENING TOOL     IDENTIFICATION  Patient Name: Javier Murillo Birthdate: 1930-12-02 Sex: male Admission Date (Current Location): 12/29/2018  [facility] [place]   Facility and Address:  [facility], [address], [place], [place] [address]      Provider Number: [id number]  Attending Physician Name and Address:  Standley BrookingGoodrich, Daniel P, MD  Relative Name and Phone Number:       Current Level of Care: Hospital Recommended Level of Care: Memory Care Prior Approval Number:    Date Approved/Denied:   PASRR Number:    Discharge Plan: Other (Comment)(Memory Care)    Current Diagnoses: Patient Active Problem List   Diagnosis Date Noted  . Urinary tract infection due to ESBL Klebsiella 01/01/2019  . Benign essential HTN 01/01/2019  . Pressure injury of skin 09/24/2017  . Acute metabolic encephalopathy 09/23/2017  . Type II diabetes mellitus with renal manifestations (HCC) 09/22/2017  . Chronic diastolic CHF (congestive heart failure) (HCC) 09/22/2017  . Severe protein-calorie malnutrition (HCC) 07/24/2017  . Dementia (HCC) 07/23/2017  . Rhabdomyolysis 10/25/2016  . Diabetes mellitus with neuropathy (HCC) 09/15/2015  . Anemia 01/30/2015  . Quadriplegia and quadriparesis (HCC) 08/20/2014  . Gait disorder 08/20/2014  . Weakness 07/23/2014  . Diabetic neuropathy (HCC) 07/23/2014  . Dyslipidemia 07/23/2014    Orientation RESPIRATION BLADDER Height & Weight     Self  Normal External catheter, Incontinent(placed 1/23) Weight: 162 lb 1 oz (73.5 kg) Height:  5\' 10"  (177.8 cm)  BEHAVIORAL SYMPTOMS/MOOD NEUROLOGICAL BOWEL NUTRITION STATUS      Continent CCHO mechanical soft   AMBULATORY STATUS COMMUNICATION OF NEEDS Skin   Total Care Verbally PU Stage and Appropriate Care PU Stage 1 Dressing: (Located on Sacrum, foam dressing, change PRN)                     Personal Care Assistance Level  of Assistance  Bathing, Feeding, Dressing Bathing Assistance: Maximum assistance Feeding assistance: Limited assistance Dressing Assistance: Maximum assistance     Functional Limitations Info  Hearing, Speech, Sight Sight Info: Adequate Hearing Info: Adequate Speech Info: Adequate    SPECIAL CARE FACTORS FREQUENCY                       Contractures Contractures Info: Not present    Additional Factors Info  Code Status, Allergies Code Status Info: DNR Allergies Info:  Fluvirin Influenza Vac Split Quad           Current Medications (01/02/2019):  This is the current hospital active medication list Current Facility-Administered Medications  Medication Dose Route Frequency Provider Last Rate Last Dose  . acetaminophen (TYLENOL) tablet 500 mg  500 mg Oral Q6H PRN Elyse HsuLambeth, Sally M, MD      . amLODipine (NORVASC) tablet 5 mg  5 mg Oral Daily Elyse HsuLambeth, Sally M, MD   5 mg at 01/02/19 [id number]  . bisacodyl (DULCOLAX) suppository 10 mg  10 mg Rectal Daily PRN Elyse HsuLambeth, Sally M, MD      . cholecalciferol (VITAMIN D3) tablet 1,000 Units  1,000 Units Oral QPM Elyse HsuLambeth, Sally M, MD   1,000 Units at 01/01/19 1857  . dextrose 5 % and 0.45 % NaCl with KCl 20 mEq/L infusion   Intravenous Continuous Standley BrookingGoodrich, Daniel P, MD 75 mL/hr at 01/01/19 2251    . docusate sodium (COLACE) capsule 100 mg  100 mg Oral BID PRN Morrison OldLambeth,  Octaviano Batty, MD      . enoxaparin (LOVENOX) injection 40 mg  40 mg Subcutaneous Q24H Lodema Hong A, RPH   40 mg at 01/01/19 1858  . ferrous sulfate tablet 325 mg  325 mg Oral Q breakfast Elyse Hsu, MD   325 mg at 01/02/19 1478  . insulin aspart (novoLOG) injection 0-5 Units  0-5 Units Subcutaneous QHS Elyse Hsu, MD   2 Units at 01/01/19 2300  . insulin aspart (novoLOG) injection 0-9 Units  0-9 Units Subcutaneous TID WC Elyse Hsu, MD   2 Units at 01/02/19 [phone]  . meropenem (MERREM) 1 g in sodium chloride 0.9 % 100 mL IVPB  1 g Intravenous Q12H Elyse Hsu,  MD 200 mL/hr at 01/02/19 0840 1 g at 01/02/19 0840  . ondansetron (ZOFRAN) tablet 4 mg  4 mg Oral Q6H PRN Elyse Hsu, MD       Or  . ondansetron [facility]) injection 4 mg  4 mg Intravenous Q6H PRN Elyse Hsu, MD      . sertraline (ZOLOFT) tablet 25 mg  25 mg Oral Daily Elyse Hsu, MD   25 mg at 01/02/19 2130     Discharge Medications: Please see discharge summary for a list of discharge medications.  Relevant Imaging Results:  Relevant Lab Results:   Additional Information [phone]  Mackson Botz A Riel Hirschman, LCSW

## 2019-01-02 NOTE — Clinical Social Work Note (Signed)
Clinical Social Work Assessment  Patient Details  Name: Javier Murillo MRN: [id number] Date of Birth: 1930/03/13  Date of referral:  01/02/19               Reason for consult:  Other (Comment Required)(return to facility)                Permission sought to share information with:  Facility Industrial/product designer granted to share information::     Name::     Javier Murillo  Agency::  [employer]  Relationship::  Daughter  Contact Information:     Housing/Transportation Living arrangements for the past 2 months:  Assisted Living Facility(memory care) Source of Information:  Adult Children Patient Interpreter Needed:  None Criminal Activity/Legal Involvement Pertinent to Current Situation/Hospitalization:  No - Comment as needed Significant Relationships:  Adult Children Lives with:  Self Do you feel safe going back to the place where you live?  Yes Need for family participation in patient care:  No (Coment)  Care giving concerns:  Pt is only alert to self. CSW spoke with pt's daughter.   Social Worker assessment / plan:  CSW spoke with pt's daughter via telephone. Pt's daughter states pt is from [facility] (Memory care unit). CSW left a message for memory care unit to confirm return. CSW awaiting a call back.  Employment status:  Retired Database administrator PT Recommendations:  Not assessed at this time Information / Referral to community resources:  Other (Comment Required)  Patient/Family's Response to care:  Pt's daughter verbalized understanding of CSW role and expressed appreciation for support. Pt's daughter denies any concern regarding pt care at this time.   Patient/Family's Understanding of and Emotional Response to Diagnosis, Current Treatment, and Prognosis:  Pt's daughter understanding of the need for pt to return to [facility] at d/c. Pt's daughter denies any further questions or concerns.  Emotional Assessment Appearance:   Appears stated age Attitude/Demeanor/Rapport:  Unable to Assess Affect (typically observed):  Unable to Assess Orientation:  Oriented to Self Alcohol / Substance use:  Not Applicable Psych involvement (Current and /or in the community):  No (Comment)  Discharge Needs  Concerns to be addressed:  Basic Needs, Care Coordination Readmission within the last 30 days:  No Current discharge risk:  Dependent with Mobility Barriers to Discharge:  Continued Medical Work up   [employer], LCSW 01/02/2019, 12:01 PM

## 2019-01-02 NOTE — Care Management Important Message (Signed)
Important Message  Patient Details  Name: Javier Murillo MRN: [id number] Date of Birth: [birth year]   Medicare Important Message Given:  Yes    Leone Haven, RN 01/02/2019, 12:33 PM

## 2019-01-03 LAB — CULTURE, BLOOD (ROUTINE X 2)
Culture: NO GROWTH
Culture: NO GROWTH
Special Requests: ADEQUATE

## 2019-01-03 LAB — GLUCOSE, CAPILLARY
Glucose-Capillary: 160 mg/dL — ABNORMAL HIGH (ref 70–99)
Glucose-Capillary: 230 mg/dL — ABNORMAL HIGH (ref 70–99)
Glucose-Capillary: 211 mg/dL — ABNORMAL HIGH (ref 70–99)

## 2019-01-03 MED ORDER — FUROSEMIDE 20 MG PO TABS: 10.0000 mg | ORAL_TABLET | Freq: Every day | ORAL | Status: AC

## 2019-01-03 NOTE — Progress Notes (Signed)
Report called to [facility]. Patient stable for discharge back to facility.

## 2019-01-03 NOTE — Discharge Summary (Signed)
Physician Discharge Summary  Javier Murillo HKV:[id number] DOB: 02/28/1930 DOA: 12/29/2018  PCP: Juluis Rainier, MD  Admit date: 12/29/2018 Discharge date: 01/03/2019  Recommendations for Outpatient Follow-up:  1. Routine care  Follow-up Information    Juluis Rainier, MD. Schedule an appointment as soon as possible for a visit in 2 week(s).   Specialty:  Family Medicine Contact information: [address] [place] [address] [phone]            Discharge Diagnoses: Principal diagnosis is #1 1. Klebsiella pneumoniae ESBL UTI with acute encephalopathy with associated rigors 2. Essential hypertension 3. Diabetes mellitus type 2 on insulin, with CKD stage III 4. Chronic diastolic CHF 5. Dementia, depression  Discharge Condition: improved Disposition: return to LTC  Diet recommendation: soft diet, may resume previous diet  Filed Weights   12/29/18 0942 12/29/18 1430  Weight: 72.6 kg 73.5 kg    History of present illness:  85 year old male presented to the emergency department after a shaking episode and episode of unresponsiveness.  Blood sugar reportedly within normal limits at the time. Patient more lethargic than usual.  Seen previously in the emergency department approximately 2 weeks ago and urine culture was positive but not treated.  Admitted for UTI with acute encephalopathy.  Hospital Course:  Patient was treated with IV antibiotics with gradual clinical improvement.  He completed antibiotics prior to discharge.  Hospitalization was otherwise uncomplicated, see individual issues as below.  I discussed in detail with his daughter and son-in-law at bedside.  Klebsiella pneumoniae ESBL UTI with acute encephalopathy with associated rigors --Encephalopathy largely resolved.  Remains afebrile and vital signs are stable.  Completed antibiotics as an inpatient.  Essential hypertension --Stable.  Continue amlodipine  Diabetes mellitus type 2 on  insulin, with CKD stage III --Resume insulin on discharge --Per last endocrinology note 1/7: Toujeo 26 units at 8PM Mealtime HUMALOG coverage based on glucose readings:  If the blood sugar is below 90 give the Humalog insulin after the meal If the blood sugar is below 70 may obtain Humalog insulin  BREAKFAST doses are as follows: Blood sugar under 200 = 6 units, 200-249 = 7 units.  250+ = 8 units  Humalog to be given just before LUNCH and dinner : If blood sugar under 200 = give 8 units 200-249 = 10 units and over 250 = 12 units  Chronic diastolic CHF --Remains euvolemic.    Continue low-dose Lasix daily.  Dementia, depression --Continue sertraline.  Not on any other agents.  Consultants   None  Procedures   None  Antibiotics   Meropenem 1/23 > 1/28  Today's assessment: S: seems to feel okay. O: Vitals:  Vitals:   01/02/19 2058 01/03/19 0826  BP: (!) 160/77 (!) 147/94  Pulse: 75 63  Resp: 16 20  Temp: (!) 97.3 F (36.3 C) (!) 97.3 F (36.3 C)  SpO2: 94% 97%    Constitutional:  . Appears calm and comfortable, awake, alert Eyes:  . pupils and irises appear normal ENMT:  . Hard of hearing . Poor dentition Respiratory:  . CTA bilaterally, no w/r/r.  . Respiratory effort normal.  Cardiovascular:  . RRR, no m/r/g . No LE extremity edema   Abdomen:  . Soft, nontender, nondistended Psychiatric:  o Confused, speaks slowly, follows some simple commands very slightly.    Discharge Instructions   Allergies as of 01/03/2019      Reactions   Fluvirin [influenza Vac Split Quad] Other (See Comments)   Gave patient flu  Medication List    TAKE these medications   acetaminophen 500 MG tablet Commonly known as:  TYLENOL Take 1 tablet (500 mg total) by mouth every 6 (six) hours as needed (pain).   amLODipine 5 MG tablet Commonly known as:  NORVASC Take 1 tablet (5 mg total) by mouth daily.   cholecalciferol 1000 units tablet Commonly known  as:  VITAMIN D Take 1,000 Units by mouth every evening.   ferrous sulfate 325 (65 FE) MG tablet Take 325 mg by mouth daily with breakfast.   furosemide 20 MG tablet Commonly known as:  LASIX Take 0.5 tablets (10 mg total) by mouth daily.   HUMALOG 100 UNIT/ML injection Generic drug:  insulin lispro Inject 6-12 Units into the skin 3 (three) times daily. In the morning 70-199=6 units, 200-249=7 units, 250+=8 units 2 times daily 70-199=8 units, 200-249=10 units, 250+=12 units   sertraline 25 MG tablet Commonly known as:  ZOLOFT Take 25 mg by mouth daily.   TOUJEO SOLOSTAR 300 UNIT/ML Sopn Generic drug:  Insulin Glargine (1 Unit Dial) Inject 26 Units into the skin at bedtime.      Allergies  Allergen Reactions  . Fluvirin [Influenza Vac Split Quad] Other (See Comments)    Gave patient flu    The results of significant diagnostics from this hospitalization (including imaging, microbiology, ancillary and laboratory) are listed below for reference.    Significant Diagnostic Studies: Dg Chest 1 View  Result Date: 12/29/2018 CLINICAL DATA:  Code sepsis. EXAM: CHEST  1 VIEW COMPARISON:  None FINDINGS: Normal heart size. No pleural effusion or interstitial edema. Decreased lung volumes with platelike atelectasis in both lung bases. No airspace consolidation noted. IMPRESSION: 1. Low lung volumes with bibasilar atelectasis. Electronically Signed   By: Signa Kellaylor  Stroud M.D.   On: 12/29/2018 10:25   Dg Chest 1 View  Result Date: 12/12/2018 CLINICAL DATA:  Altered mental status. EXAM: CHEST  1 VIEW COMPARISON:  Chest x-ray dated 09/22/2017 FINDINGS: The heart size and pulmonary vascularity are normal. There is minimal atelectasis at the lung bases, improved since the prior study. No infiltrates or effusions. No acute bone abnormality. Aortic atherosclerosis. IMPRESSION: 1. Minimal bibasilar atelectasis, improved since the prior study. 2. Lungs otherwise clear. 3.  Aortic Atherosclerosis  (ICD10-I70.0). Electronically Signed   By: Francene BoyersJames  Maxwell M.D.   On: 12/12/2018 10:15   Ct Head Wo Contrast  Result Date: 12/12/2018 CLINICAL DATA:  Recent syncopal episode EXAM: CT HEAD WITHOUT CONTRAST TECHNIQUE: Contiguous axial images were obtained from the base of the skull through the vertex without intravenous contrast. COMPARISON:  10/24/2016 FINDINGS: Brain: Chronic atrophic and ischemic changes are seen. These are stable from the prior exam. No findings to suggest acute hemorrhage, acute infarction or space-occupying mass lesion are noted. Vascular: No hyperdense vessel or unexpected calcification. Skull: Normal. Negative for fracture or focal lesion. Sinuses/Orbits: No acute finding. Other: None. IMPRESSION: Chronic atrophic and ischemic changes without acute abnormality. Electronically Signed   By: Alcide CleverMark  Lukens M.D.   On: 12/12/2018 11:32    Microbiology: Recent Results (from the past 240 hour(s))  Blood Culture (routine x 2)     Status: None   Collection Time: 12/29/18 10:14 AM  Result Value Ref Range Status   Specimen Description BLOOD LEFT FOREARM  Final   Special Requests   Final    BOTTLES DRAWN AEROBIC AND ANAEROBIC Blood Culture results may not be optimal due to an inadequate volume of blood received in culture bottles   Culture  Final    NO GROWTH 5 DAYS Performed at [facility], [address] [place], [place] [address]    Report Status 01/03/2019 FINAL  Final  Blood Culture (routine x 2)     Status: None   Collection Time: 12/29/18 10:15 AM  Result Value Ref Range Status   Specimen Description BLOOD RIGHT HAND  Final   Special Requests   Final    BOTTLES DRAWN AEROBIC AND ANAEROBIC Blood Culture adequate volume   Culture   Final    NO GROWTH 5 DAYS Performed at [facility], [address], [place], [place] [address]    Report Status 01/03/2019 FINAL  Final  Culture, Urine     Status: Abnormal   Collection Time: 12/29/18 12:37 PM  Result Value Ref  Range Status   Specimen Description URINE, RANDOM  Final   Special Requests   Final    NONE Performed at [facility], [address], [place], [place] [address]    Culture (A)  Final    80,000 COLONIES/mL KLEBSIELLA PNEUMONIAE Confirmed Extended Spectrum Beta-Lactamase Producer (ESBL).  In bloodstream infections from ESBL organisms, carbapenems are preferred over piperacillin/tazobactam. They are shown to have a lower risk of mortality.    Report Status 12/31/2018 FINAL  Final   Organism ID, Bacteria KLEBSIELLA PNEUMONIAE (A)  Final      Susceptibility   Klebsiella pneumoniae - MIC*    AMPICILLIN >=32 RESISTANT Resistant     CEFAZOLIN >=64 RESISTANT Resistant     CEFTRIAXONE 4 SENSITIVE Sensitive     CIPROFLOXACIN <=0.25 SENSITIVE Sensitive     GENTAMICIN <=1 SENSITIVE Sensitive     IMIPENEM <=0.25 SENSITIVE Sensitive     NITROFURANTOIN 64 INTERMEDIATE Intermediate     TRIMETH/SULFA >=320 RESISTANT Resistant     AMPICILLIN/SULBACTAM 8 SENSITIVE Sensitive     PIP/TAZO <=4 SENSITIVE Sensitive     Extended ESBL POSITIVE Resistant     * 80,000 COLONIES/mL KLEBSIELLA PNEUMONIAE  MRSA PCR Screening     Status: None   Collection Time: 12/29/18  4:49 PM  Result Value Ref Range Status   MRSA by PCR NEGATIVE NEGATIVE Final    Comment:        The GeneXpert MRSA Assay (FDA approved for NASAL specimens only), is one component of a comprehensive MRSA colonization surveillance program. It is not intended to diagnose MRSA infection nor to guide or monitor treatment for MRSA infections. Performed at [facility], [address] [place], [place] [address]      Labs: Basic Metabolic Panel: Recent Labs  Lab 12/29/18 1014 12/30/18 0357 01/01/19 0335  NA 140 138 139  K 3.6 3.7 4.0  CL 107 105 104  CO2 26 27 26   GLUCOSE 91 109* 118*  BUN 20 16 13   CREATININE 1.60* 1.55* 1.52*  CALCIUM 9.1 9.1 9.1   Liver Function Tests: Recent Labs  Lab 12/29/18 1014  AST 19    ALT 13  ALKPHOS 57  BILITOT 0.4  PROT 6.2*  ALBUMIN 3.3*   CBC: Recent Labs  Lab 12/29/18 1014 12/30/18 0357  WBC 12.3* 10.8*  NEUTROABS 9.4*  --   HGB 12.8* 12.5*  HCT 39.9 39.6  MCV 98.8 98.0  PLT 353 315    CBG: Recent Labs  Lab 01/02/19 1208 01/02/19 1629 01/02/19 2055 01/03/19 0736 01/03/19 1203  GLUCAP 328* 263* 206* 160* 230*    Principal Problem:   Urinary tract infection due to ESBL  Klebsiella Active Problems:   Diabetes mellitus with neuropathy (HCC)   Dementia (HCC)   Type II diabetes mellitus with renal manifestations (HCC)   Chronic diastolic CHF (congestive heart failure) (HCC)   Acute metabolic encephalopathy   Benign essential HTN   Pressure ulcer   Time coordinating discharge: 35 minutes  Signed:  [name] Jovante Hammitt, MD  [facility]  01/03/2019, 12:57 PM

## 2019-01-03 NOTE — Clinical Social Work Placement (Signed)
   CLINICAL SOCIAL WORK PLACEMENT  NOTE  Date:  01/03/2019  Patient Details  Name: Javier Murillo MRN: [id number] Date of Birth: [birth year]  Clinical Social Work is seeking post-discharge placement for this patient at the Assisted Living Facility level of care (*CSW will initial, date and re-position this form in  chart as items are completed):      Patient/family provided with [facility] Clinical Social Work Department's list of facilities offering this level of care within the geographic area requested by the patient (or if unable, by the patient's family).      Patient/family informed of their freedom to choose among providers that offer the needed level of care, that participate in Medicare, Medicaid or managed care program needed by the patient, have an available bed and are willing to accept the patient.      Patient/family informed of [facility]'s ownership interest in [facility] and [facility], as well as of the fact that they are under no obligation to receive care at these facilities.  PASRR submitted to EDS on       PASRR number received on       Existing PASRR number confirmed on       FL2 transmitted to all facilities in geographic area requested by pt/family on       FL2 transmitted to all facilities within larger geographic area on       Patient informed that his/her managed care company has contracts with or will negotiate with certain facilities, including the following:            Patient/family informed of bed offers received.  Patient [facility] bed at [facility]     Physician recommends and patient chooses bed at      Patient to be transferred to [facility] on 01/03/19.  Patient to be transferred to facility by [facility]     Patient family notified on 01/03/19 of transfer.  Name of family member notified:  [name]     PHYSICIAN       Additional Comment:     _______________________________________________ Maree Krabbe, LCSW 01/03/2019, 2:12 PM

## 2019-01-03 NOTE — Progress Notes (Signed)
Patient discharged to [facility] via [place] transport, all belongings with patient upon discharge, condom catheter remains in place upon discharge for patient comfort.

## 2019-01-03 NOTE — Clinical Social Work Note (Signed)
Clinical Social Worker facilitated patient discharge including contacting patient family and facility to confirm patient discharge plans.  Clinical information faxed to facility and family agreeable with plan.  CSW arranged ambulance transport via [facility] to [facility] .  RN to call [phone] for report prior to [email or web address]  Clinical Social Worker will sign off for now as social work intervention is no longer needed. Please consult [place] again if new need arises.  [place], [place] [phone]

## 2019-02-14 ENCOUNTER — Ambulatory Visit: Payer: Medicare Other | Admitting: Endocrinology

## 2019-03-08 DEATH — deceased
